# Patient Record
Sex: Female | Born: 1970 | Hispanic: Yes | Marital: Married | Smoking: Never smoker
Health system: Southern US, Community
[De-identification: ages and names within clinical notes are randomized; demographics above are authoritative.]

## PROBLEM LIST (undated history)

## (undated) DIAGNOSIS — I38 Endocarditis, valve unspecified: Secondary | ICD-10-CM

## (undated) DIAGNOSIS — E785 Hyperlipidemia, unspecified: Secondary | ICD-10-CM

## (undated) DIAGNOSIS — J810 Acute pulmonary edema: Secondary | ICD-10-CM

## (undated) DIAGNOSIS — J189 Pneumonia, unspecified organism: Secondary | ICD-10-CM

## (undated) DIAGNOSIS — R7611 Nonspecific reaction to tuberculin skin test without active tuberculosis: Secondary | ICD-10-CM

## (undated) DIAGNOSIS — D649 Anemia, unspecified: Secondary | ICD-10-CM

## (undated) DIAGNOSIS — R570 Cardiogenic shock: Secondary | ICD-10-CM

## (undated) DIAGNOSIS — I272 Pulmonary hypertension, unspecified: Secondary | ICD-10-CM

## (undated) DIAGNOSIS — E0591 Thyrotoxicosis, unspecified with thyrotoxic crisis or storm: Secondary | ICD-10-CM

## (undated) DIAGNOSIS — I77819 Aortic ectasia, unspecified site: Secondary | ICD-10-CM

## (undated) DIAGNOSIS — E119 Type 2 diabetes mellitus without complications: Secondary | ICD-10-CM

## (undated) DIAGNOSIS — R011 Cardiac murmur, unspecified: Secondary | ICD-10-CM

## (undated) DIAGNOSIS — I5020 Unspecified systolic (congestive) heart failure: Secondary | ICD-10-CM

## (undated) DIAGNOSIS — I2510 Atherosclerotic heart disease of native coronary artery without angina pectoris: Secondary | ICD-10-CM

## (undated) DIAGNOSIS — I10 Essential (primary) hypertension: Secondary | ICD-10-CM

## (undated) DIAGNOSIS — I219 Acute myocardial infarction, unspecified: Secondary | ICD-10-CM

## (undated) DIAGNOSIS — E0590 Thyrotoxicosis, unspecified without thyrotoxic crisis or storm: Secondary | ICD-10-CM

## (undated) DIAGNOSIS — E66813 Obesity, class 3: Secondary | ICD-10-CM

## (undated) DIAGNOSIS — G9340 Encephalopathy, unspecified: Secondary | ICD-10-CM

## (undated) HISTORY — DX: Nonspecific reaction to tuberculin skin test without active tuberculosis: R76.11

## (undated) HISTORY — DX: Obesity, class 3: E66.813

## (undated) HISTORY — DX: Acute pulmonary edema: J81.0

## (undated) HISTORY — DX: Cardiac murmur, unspecified: R01.1

## (undated) HISTORY — DX: Atherosclerotic heart disease of native coronary artery without angina pectoris: I25.10

## (undated) HISTORY — DX: Pulmonary hypertension, unspecified: I27.20

## (undated) HISTORY — DX: Pneumonia, unspecified organism: J18.9

## (undated) HISTORY — DX: Essential (primary) hypertension: I10

## (undated) HISTORY — DX: Cardiogenic shock: R57.0

## (undated) HISTORY — DX: Endocarditis, valve unspecified: I38

## (undated) HISTORY — PX: CARDIAC CATHETERIZATION: SHX172

## (undated) HISTORY — DX: Thyrotoxicosis, unspecified with thyrotoxic crisis or storm: E05.91

## (undated) HISTORY — DX: Thyrotoxicosis, unspecified without thyrotoxic crisis or storm: E05.90

## (undated) HISTORY — DX: Morbid (severe) obesity due to excess calories: E66.01

## (undated) HISTORY — DX: Aortic ectasia, unspecified site: I77.819

## (undated) HISTORY — DX: Encephalopathy, unspecified: G93.40

## (undated) HISTORY — DX: Anemia, unspecified: D64.9

## (undated) HISTORY — DX: Unspecified systolic (congestive) heart failure: I50.20

## (undated) HISTORY — DX: Hyperlipidemia, unspecified: E78.5

---

## 2012-02-17 ENCOUNTER — Other Ambulatory Visit (HOSPITAL_COMMUNITY)
Admission: RE | Admit: 2012-02-17 | Discharge: 2012-02-17 | Disposition: A | Discharge status: Home / Self Care | Payer: Self-pay | Source: Ambulatory Visit | Attending: Family Medicine | Admitting: Family Medicine

## 2012-02-17 DIAGNOSIS — Z0000 Encounter for general adult medical examination without abnormal findings: Secondary | ICD-10-CM | POA: Insufficient documentation

## 2012-06-04 ENCOUNTER — Other Ambulatory Visit: Payer: Self-pay | Admitting: Physician Assistant

## 2012-06-04 DIAGNOSIS — Z1231 Encounter for screening mammogram for malignant neoplasm of breast: Secondary | ICD-10-CM

## 2012-06-08 ENCOUNTER — Ambulatory Visit
Admission: RE | Admit: 2012-06-08 | Discharge: 2012-06-08 | Disposition: A | Discharge status: Home / Self Care | Payer: Managed Care, Other (non HMO) | Source: Ambulatory Visit | Attending: Physician Assistant | Admitting: Physician Assistant

## 2012-06-08 DIAGNOSIS — Z1231 Encounter for screening mammogram for malignant neoplasm of breast: Secondary | ICD-10-CM

## 2012-06-11 ENCOUNTER — Other Ambulatory Visit: Payer: Self-pay | Admitting: Physician Assistant

## 2012-06-11 DIAGNOSIS — R928 Other abnormal and inconclusive findings on diagnostic imaging of breast: Secondary | ICD-10-CM

## 2012-07-13 ENCOUNTER — Ambulatory Visit
Admission: RE | Admit: 2012-07-13 | Discharge: 2012-07-13 | Disposition: A | Discharge status: Home / Self Care | Payer: Managed Care, Other (non HMO) | Source: Ambulatory Visit | Attending: Physician Assistant | Admitting: Physician Assistant

## 2012-07-13 DIAGNOSIS — R928 Other abnormal and inconclusive findings on diagnostic imaging of breast: Secondary | ICD-10-CM

## 2013-01-03 ENCOUNTER — Telehealth: Payer: Self-pay | Admitting: Oncology

## 2013-01-03 NOTE — Telephone Encounter (Signed)
CALLED PT LVM TO RETURN CALL ON NP APPT. 01/10/13@12 :30(PER DR HA) REFERRING DR SUN DX-PERSISTENT LEUKOCYTOSIS MAILED NP PACKET

## 2013-01-03 NOTE — Telephone Encounter (Signed)
C/D 01/03/13 for appt. 01/10/13 °

## 2013-01-08 ENCOUNTER — Telehealth: Payer: Self-pay | Admitting: *Deleted

## 2013-01-08 NOTE — Telephone Encounter (Signed)
Pt called to confirm her appt for 3/27.  She asks if she can eat prior to appt.. Informed her ok to eat whatever she normally eats,  No need to fast.  She verbalized understanding.

## 2013-01-09 ENCOUNTER — Encounter: Payer: Self-pay | Admitting: Oncology

## 2013-01-09 DIAGNOSIS — D72829 Elevated white blood cell count, unspecified: Secondary | ICD-10-CM | POA: Insufficient documentation

## 2013-01-09 NOTE — Patient Instructions (Addendum)
1.  Issue:  Leukocytosis (elevated white blood cell) 2.  Potential causes:  Reactive process.  I did not see sign of leukemia on blood smear today.   3.  Recommendation:  Observation with regular blood work in about 4 and 8 months.  Return visit in about 1 year.   If white blood cell count significantly increases or also with anemia or low platelet count, we may consider diagnostic bone marrow biopsy in the future.  At this time, a bone marrow biopsy has very low clinical yield.

## 2013-01-10 ENCOUNTER — Encounter: Payer: Self-pay | Admitting: Oncology

## 2013-01-10 ENCOUNTER — Other Ambulatory Visit (HOSPITAL_BASED_OUTPATIENT_CLINIC_OR_DEPARTMENT_OTHER): Payer: Managed Care, Other (non HMO) | Admitting: Lab

## 2013-01-10 ENCOUNTER — Telehealth: Payer: Self-pay | Admitting: Oncology

## 2013-01-10 ENCOUNTER — Ambulatory Visit: Payer: Managed Care, Other (non HMO)

## 2013-01-10 ENCOUNTER — Ambulatory Visit (HOSPITAL_BASED_OUTPATIENT_CLINIC_OR_DEPARTMENT_OTHER): Payer: Managed Care, Other (non HMO) | Admitting: Oncology

## 2013-01-10 VITALS — BP 184/99 | HR 82 | Temp 98.6°F | Resp 20 | Ht 62.0 in | Wt 200.1 lb

## 2013-01-10 DIAGNOSIS — M259 Joint disorder, unspecified: Secondary | ICD-10-CM

## 2013-01-10 DIAGNOSIS — D72829 Elevated white blood cell count, unspecified: Secondary | ICD-10-CM

## 2013-01-10 DIAGNOSIS — R2231 Localized swelling, mass and lump, right upper limb: Secondary | ICD-10-CM

## 2013-01-10 LAB — LACTATE DEHYDROGENASE (CC13): LDH: 163 U/L (ref 125–245)

## 2013-01-10 LAB — COMPREHENSIVE METABOLIC PANEL (CC13)
ALT: 11 U/L (ref 0–55)
Albumin: 3.8 g/dL (ref 3.5–5.0)
Alkaline Phosphatase: 57 U/L (ref 40–150)
CO2: 23 mEq/L (ref 22–29)
Glucose: 107 mg/dl — ABNORMAL HIGH (ref 70–99)
Potassium: 3.4 mEq/L — ABNORMAL LOW (ref 3.5–5.1)
Sodium: 139 mEq/L (ref 136–145)
Total Bilirubin: 0.29 mg/dL (ref 0.20–1.20)
Total Protein: 7.4 g/dL (ref 6.4–8.3)

## 2013-01-10 LAB — CBC WITH DIFFERENTIAL/PLATELET
BASO%: 0.7 % (ref 0.0–2.0)
Basophils Absolute: 0.1 10*3/uL (ref 0.0–0.1)
EOS%: 0.6 % (ref 0.0–7.0)
HGB: 11.9 g/dL (ref 11.6–15.9)
MCH: 26.7 pg (ref 25.1–34.0)
RBC: 4.46 10*6/uL (ref 3.70–5.45)
RDW: 16 % — ABNORMAL HIGH (ref 11.2–14.5)
lymph#: 3.7 10*3/uL — ABNORMAL HIGH (ref 0.9–3.3)

## 2013-01-10 LAB — MORPHOLOGY
PLT EST: ADEQUATE
RBC Comments: NORMAL

## 2013-01-10 NOTE — Progress Notes (Signed)
[facility]  [phone] [phone]     INITIAL HEMATOLOGY CONSULTATION    Referral MD:  Ms. Ricci Barker, PA-C  Reason for Referral: lymphocyte-predominant leukocytosis.     HPI: Amber [name] is a 42 year-old [place] American woman with no significant PMH.  She recently had a CBC with her PCP on 12/07/2012 which showed WBC 13.1; Hgb 13.1; Plt 385; ANC 6.7; ALC 5.1.  She was kindly referred to the [facility] for evaluation.  Amber [name] presented to the Clinic today for the first time with her friend.  She reported feeling well.  She has this mass in the right upper shoulder for many years.  Over the last few weeks, her husband thinks that it is growing in size.  It has become tender and red.  She denied any recent bug bite, trauma, bleeding or discharge from the mass.  She cannot sleep supine due to the painful mass.    Patient denies fever, anorexia, weight loss, fatigue, headache, visual changes, confusion, drenching night sweats, palpable lymph node swelling, mucositis, odynophagia, dysphagia, nausea vomiting, jaundice, chest pain, palpitation, shortness of breath, dyspnea on exertion, productive cough, gum bleeding, epistaxis, hematemesis, hemoptysis, abdominal pain, abdominal swelling, early satiety, melena, hematochezia, hematuria, skin rash, spontaneous bleeding, joint swelling, joint pain, heat or cold intolerance, bowel bladder incontinence, back pain, focal motor weakness, paresthesia, depression, suicidal or homicidal ideation, feeling hopelessness.      Past Medical History  Diagnosis Date  . Leukocytosis, unspecified 01/09/2013  . HTN (hypertension)   :    Past Surgical History  Procedure Laterality Date  . Cesarean section      x2  :   CURRENT MEDS: Current Outpatient Prescriptions  Medication Sig Dispense Refill  . Cholecalciferol (VITAMIN D3) 2000 UNITS TABS Take 1 tablet by mouth daily.      . ferrous  sulfate 325 (65 FE) MG tablet Take by mouth daily with breakfast. Pt unsure of dose      . lisinopril-hydrochlorothiazide (PRINZIDE,ZESTORETIC) 20-12.5 MG per tablet Take 1 tablet by mouth daily.       No current facility-administered medications for this visit.      No Known Allergies:  Family History  Problem Relation Age of Onset  . Diabetes Father   :  History   Social History  . Marital Status: Unknown    Spouse Name: N/A    Number of Children: 2  . Years of Education: N/A   Occupational History  .      front desk    Social History Main Topics  . Smoking status: Never Smoker   . Smokeless tobacco: Never Used  . Alcohol Use: No  . Drug Use: No  . Sexually Active: Not on file   Other Topics Concern  . Not on file   Social History Narrative  . No narrative on file  :  REVIEW OF SYSTEM:  The rest of the 14-point review of sytem was negative.   Exam: ECOG 0.   General:  well-nourished woman, in no acute distress.  Eyes:  no scleral icterus.  ENT:  There were no oropharyngeal lesions.  Neck was without thyromegaly.  Lymphatics:  Negative cervical, supraclavicular or axillary adenopathy.  Respiratory: lungs were clear bilaterally without wheezing or crackles.  Cardiovascular:  Regular rate and rhythm, S1/S2, without murmur, rub or gallop.  There was no pedal edema.  GI:  abdomen was soft, flat, nontender, nondistended, without organomegaly.  Muscoloskeletal:  no  spinal tenderness of palpation of vertebral spine.  There was a 5cm SQ mass, hard, non fluctuant, erythematous, fixed, tender to palpation.  There was no open wound nor purulent discharge.  Skin exam was without echymosis, petichae.  Neuro exam was nonfocal.  Patient was able to get on and off exam table without assistance.  Gait was normal.  Patient was alerted and oriented.  Attention was good.   Language was appropriate.  Mood was normal without depression.  Speech was not pressured.  Thought content was not  tangential.    LABS:  Lab Results  Component Value Date   WBC 10.8* 01/10/2013   HGB 11.9 01/10/2013   HCT 34.7* 01/10/2013   PLT 313 01/10/2013   GLUCOSE 107* 01/10/2013   ALT 11 01/10/2013   AST 13 01/10/2013   NA 139 01/10/2013   K 3.4* 01/10/2013   CL 106 01/10/2013   CREATININE 0.8 01/10/2013   BUN 15.2 01/10/2013   CO2 23 01/10/2013    Blood smear review:   I personally reviewed the patient's peripheral blood smear today.  There was isocytosis.  There was no peripheral blast.  There was no schistocytosis, spherocytosis, target cell, rouleaux formation, tear drop cell.  There was no giant platelets or platelet clumps.      ASSESSMENT AND PLAN:   1. Leukocytosis; lymphocyte predominant:  Improved today.  - Potential causes:  Reactive process.  I did not see sign of leukemia on blood smear today.   - Recommendation:  Observation with regular blood work in about 4 and 8 months.  Return visit in about 1 year.   If white blood cell count significantly increases or also with anemia or low platelet count, we may consider diagnostic bone marrow biopsy in the future.  At this time, a bone marrow biopsy has very low clinical yield.   2.  Right upper shoulder mass: - Differentials:  Infected cyst; lipoma; need to rule out sarcoma due to recent growth in size and symptomatic.   - Work up:  I referred her to [facility] for evaluation within 1 week.     The length of time of the face-to-face encounter was 30 minutes. More than 50% of time was spent counseling and coordination of care.     Thank you for this referral.      '

## 2013-01-17 ENCOUNTER — Encounter (HOSPITAL_COMMUNITY): Payer: Self-pay | Admitting: Pharmacy Technician

## 2013-01-17 ENCOUNTER — Ambulatory Visit (INDEPENDENT_AMBULATORY_CARE_PROVIDER_SITE_OTHER): Payer: Managed Care, Other (non HMO) | Admitting: General Surgery

## 2013-01-17 ENCOUNTER — Ambulatory Visit (HOSPITAL_COMMUNITY)
Admission: RE | Admit: 2013-01-17 | Discharge: 2013-01-17 | Disposition: A | Discharge status: Home / Self Care | Payer: Managed Care, Other (non HMO) | Source: Ambulatory Visit | Attending: General Surgery | Admitting: General Surgery

## 2013-01-17 ENCOUNTER — Encounter (HOSPITAL_COMMUNITY)
Admission: RE | Admit: 2013-01-17 | Discharge: 2013-01-17 | Disposition: A | Discharge status: Home / Self Care | Payer: Managed Care, Other (non HMO) | Source: Ambulatory Visit | Attending: General Surgery | Admitting: General Surgery

## 2013-01-17 ENCOUNTER — Encounter (INDEPENDENT_AMBULATORY_CARE_PROVIDER_SITE_OTHER): Payer: Self-pay | Admitting: General Surgery

## 2013-01-17 ENCOUNTER — Encounter (HOSPITAL_COMMUNITY): Payer: Self-pay

## 2013-01-17 VITALS — BP 126/86 | HR 88 | Temp 97.3°F | Resp 16 | Ht 63.0 in | Wt 195.2 lb

## 2013-01-17 DIAGNOSIS — L089 Local infection of the skin and subcutaneous tissue, unspecified: Secondary | ICD-10-CM

## 2013-01-17 DIAGNOSIS — L723 Sebaceous cyst: Secondary | ICD-10-CM

## 2013-01-17 LAB — CBC
Hemoglobin: 12.7 g/dL (ref 12.0–15.0)
MCH: 27.4 pg (ref 26.0–34.0)
RBC: 4.64 MIL/uL (ref 3.87–5.11)

## 2013-01-17 LAB — BASIC METABOLIC PANEL
CO2: 29 mEq/L (ref 19–32)
Calcium: 9.4 mg/dL (ref 8.4–10.5)
Glucose, Bld: 110 mg/dL — ABNORMAL HIGH (ref 70–99)
Potassium: 3.6 mEq/L (ref 3.5–5.1)
Sodium: 137 mEq/L (ref 135–145)

## 2013-01-17 MED ORDER — OXYCODONE-ACETAMINOPHEN 10-325 MG PO TABS: 1.0000 | ORAL_TABLET | Freq: Four times a day (QID) | ORAL | Status: DC | PRN

## 2013-01-17 NOTE — Progress Notes (Signed)
Husband with patient today at preop appointment.  Patient understands most English.  Patient signed consent for Release of Interpreter for husband to be interpreter.  Placed on front of chart.

## 2013-01-17 NOTE — Progress Notes (Signed)
Patient ID: Amber [name], female   DOB: 06/24/1971, 42 y.o.   MRN: [id number]  Chief Complaint  Patient presents with  . New Evaluation    eval Rt shoulder mass    HPI Amber [name] is a 42 y.o. female.  The patient is a 42-year-old female who is referred by Dr. Ha for an evaluation of the right shoulder mass. Patient states that the mass been in for approximately 3 years and is becoming more erythematous and grown in size over the last several months.  She's had no draining from the area, but has become more painful in the last month.  HPI  Past Medical History  Diagnosis Date  . Leukocytosis, unspecified 01/09/2013  . HTN (hypertension)     Past Surgical History  Procedure Laterality Date  . Cesarean section      x2    Family History  Problem Relation Age of Onset  . Diabetes Father   . Diabetes Mother   . Heart disease Mother   . Diabetes Sister   . Hypertension Sister     Social History History  Substance Use Topics  . Smoking status: Never Smoker   . Smokeless tobacco: Never Used  . Alcohol Use: No    No Known Allergies  Current Outpatient Prescriptions  Medication Sig Dispense Refill  . Cholecalciferol (VITAMIN D3) 2000 UNITS TABS Take 1 tablet by mouth daily.      . ferrous sulfate 325 (65 FE) MG tablet Take by mouth daily with breakfast. Pt unsure of dose      . lisinopril-hydrochlorothiazide (PRINZIDE,ZESTORETIC) 20-12.5 MG per tablet Take 1 tablet by mouth daily.      . cephALEXin (KEFLEX) 500 MG capsule       . traMADol (ULTRAM) 50 MG tablet        No current facility-administered medications for this visit.    Review of Systems Review of Systems  Constitutional: Negative.   HENT: Negative.   Eyes: Negative.   Respiratory: Negative.   Cardiovascular: Negative.   Gastrointestinal: Negative.   Endocrine: Negative.   Neurological: Negative.   All other systems reviewed and are negative.    Blood pressure 126/86, pulse 88, temperature 97.3 F  (36.3 C), temperature source Temporal, resp. rate 16, height 5' 3" (1.6 m), weight 195 lb 3.2 oz (88.542 kg).  Physical Exam Physical Exam  Constitutional: She is oriented to person, place, and time. She appears well-developed and well-nourished.  HENT:  Head: Normocephalic and atraumatic.  Eyes: Conjunctivae and EOM are normal. Pupils are equal, round, and reactive to light.  Neck: Normal range of motion. Neck supple.  Cardiovascular: Normal rate, regular rhythm and normal heart sounds.   Pulmonary/Chest: Effort normal and breath sounds normal.  Abdominal: Soft. Bowel sounds are normal.  Musculoskeletal: Normal range of motion.  Neurological: She is alert and oriented to person, place, and time.  Skin:       Data Reviewed none  Assessment    A 42-year-old female with a right scapular cyst.     Plan    1. Will proceed the operating room for excision of right scapular cyst.  2. We discussed risks and benefits of excision of her cyst. These do include infection, bleeding, recurrence, and damage to surrounding structures. The patient was understanding and wished to proceed with the procedure.        Desiray Orchard Jr., Prachi Oftedahl 01/17/2013, 9:38 AM    

## 2013-01-17 NOTE — Patient Instructions (Signed)
Amber [name]  01/17/2013   Your procedure is scheduled on:  01/18/13   Report to [facility] at   0730 AM.  Call this number if you have problems the morning of surgery: [phone]   Remember:   Do not eat food or drink liquids after midnight.   Take these medicines the morning of surgery with A SIP OF WATER:    Do not wear jewelry, make-up or nail polish.  Do not wear lotions, powders, or perfumes.   Do not shave 48 hours prior to surgery.   Do not bring valuables to the hospital.  Contacts, dentures or bridgework may not be worn into surgery.     Patients discharged the day of surgery will not be allowed to drive  home.  Name and phone number of your driver:    SEE CHG INSTRUCTION SHEET    Please read over the following fact sheets that you were given: MRSA Information, coughing and deep breathing exercises, leg exercises               Failure to comply with these instructions may result in cancellation of your surgery.                Patient Signature ____________________________              Nurse Signature _____________________________

## 2013-01-17 NOTE — Progress Notes (Signed)
Dr. Derrell Lolling,         Amber [name] is coming to [facility] this afternoon at 2:00 pm for her preop visit.  Please enter her preop orders in Epic.                         Thanks

## 2013-01-18 ENCOUNTER — Ambulatory Visit (HOSPITAL_COMMUNITY)
Admission: RE | Admit: 2013-01-18 | Discharge: 2013-01-18 | Disposition: A | Discharge status: Home / Self Care | Payer: Managed Care, Other (non HMO) | Source: Ambulatory Visit | Attending: General Surgery | Admitting: General Surgery

## 2013-01-18 ENCOUNTER — Encounter (HOSPITAL_COMMUNITY)
Admission: RE | Disposition: A | Discharge status: Home / Self Care | Payer: Self-pay | Source: Ambulatory Visit | Attending: General Surgery

## 2013-01-18 ENCOUNTER — Encounter (INDEPENDENT_AMBULATORY_CARE_PROVIDER_SITE_OTHER): Payer: Self-pay | Admitting: General Surgery

## 2013-01-18 ENCOUNTER — Ambulatory Visit (HOSPITAL_COMMUNITY): Payer: Managed Care, Other (non HMO) | Admitting: Anesthesiology

## 2013-01-18 ENCOUNTER — Encounter (HOSPITAL_COMMUNITY): Payer: Self-pay | Admitting: *Deleted

## 2013-01-18 ENCOUNTER — Encounter (HOSPITAL_COMMUNITY): Payer: Self-pay | Admitting: Anesthesiology

## 2013-01-18 DIAGNOSIS — L723 Sebaceous cyst: Secondary | ICD-10-CM

## 2013-01-18 HISTORY — PX: MASS EXCISION: SHX2000

## 2013-01-18 SURGERY — EXCISION MASS
Anesthesia: General | Site: Shoulder | Laterality: Right | Wound class: Dirty or Infected

## 2013-01-18 MED ORDER — SULFAMETHOXAZOLE-TRIMETHOPRIM 800-160 MG PO TABS: 1.0000 | ORAL_TABLET | Freq: Two times a day (BID) | ORAL | Status: AC

## 2013-01-18 MED ORDER — ONDANSETRON HCL 4 MG/2ML IJ SOLN
INTRAMUSCULAR | Status: DC | PRN
  Administered 2013-01-18 (×2): 2 mg via INTRAVENOUS

## 2013-01-18 MED ORDER — LACTATED RINGERS IV SOLN: INTRAVENOUS | Status: DC

## 2013-01-18 MED ORDER — ACETAMINOPHEN 10 MG/ML IV SOLN
INTRAVENOUS | Status: AC
  Filled 2013-01-18: qty 100

## 2013-01-18 MED ORDER — LACTATED RINGERS IV SOLN
INTRAVENOUS | Status: DC
  Administered 2013-01-18: 1000 mL via INTRAVENOUS

## 2013-01-18 MED ORDER — METOCLOPRAMIDE HCL 5 MG/ML IJ SOLN
INTRAMUSCULAR | Status: DC | PRN
  Administered 2013-01-18: 5 mg via INTRAVENOUS

## 2013-01-18 MED ORDER — FENTANYL CITRATE 0.05 MG/ML IJ SOLN: 25.0000 ug | INTRAMUSCULAR | Status: DC | PRN

## 2013-01-18 MED ORDER — CEFAZOLIN SODIUM-DEXTROSE 2-3 GM-% IV SOLR
2.0000 g | INTRAVENOUS | Status: AC
  Administered 2013-01-18: 2 g via INTRAVENOUS

## 2013-01-18 MED ORDER — 0.9 % SODIUM CHLORIDE (POUR BTL) OPTIME
TOPICAL | Status: DC | PRN
  Administered 2013-01-18: 1000 mL

## 2013-01-18 MED ORDER — BUPIVACAINE-EPINEPHRINE 0.25% -1:200000 IJ SOLN
INTRAMUSCULAR | Status: AC
  Filled 2013-01-18: qty 1

## 2013-01-18 MED ORDER — LIDOCAINE HCL (CARDIAC) 20 MG/ML IV SOLN
INTRAVENOUS | Status: DC | PRN
  Administered 2013-01-18: 75 mg via INTRAVENOUS

## 2013-01-18 MED ORDER — CEFAZOLIN SODIUM-DEXTROSE 2-3 GM-% IV SOLR
INTRAVENOUS | Status: AC
  Filled 2013-01-18: qty 50

## 2013-01-18 MED ORDER — MIDAZOLAM HCL 5 MG/5ML IJ SOLN
INTRAMUSCULAR | Status: DC | PRN
  Administered 2013-01-18 (×2): 1 mg via INTRAVENOUS

## 2013-01-18 MED ORDER — DEXAMETHASONE SODIUM PHOSPHATE 10 MG/ML IJ SOLN
INTRAMUSCULAR | Status: DC | PRN
  Administered 2013-01-18: 10 mg via INTRAVENOUS

## 2013-01-18 MED ORDER — EPHEDRINE SULFATE 50 MG/ML IJ SOLN
INTRAMUSCULAR | Status: DC | PRN
  Administered 2013-01-18 (×2): 5 mg via INTRAVENOUS

## 2013-01-18 MED ORDER — LACTATED RINGERS IV SOLN
INTRAVENOUS | Status: DC | PRN
  Administered 2013-01-18 (×2): via INTRAVENOUS

## 2013-01-18 MED ORDER — FENTANYL CITRATE 0.05 MG/ML IJ SOLN
INTRAMUSCULAR | Status: DC | PRN
  Administered 2013-01-18 (×2): 50 ug via INTRAVENOUS

## 2013-01-18 MED ORDER — OXYCODONE-ACETAMINOPHEN 10-325 MG PO TABS: 1.0000 | ORAL_TABLET | ORAL | Status: DC | PRN

## 2013-01-18 MED ORDER — PROMETHAZINE HCL 25 MG/ML IJ SOLN: 6.2500 mg | INTRAMUSCULAR | Status: DC | PRN

## 2013-01-18 MED ORDER — PROPOFOL 10 MG/ML IV EMUL
INTRAVENOUS | Status: DC | PRN
  Administered 2013-01-18: 180 mg via INTRAVENOUS

## 2013-01-18 MED ORDER — ACETAMINOPHEN 10 MG/ML IV SOLN
INTRAVENOUS | Status: DC | PRN
  Administered 2013-01-18: 1000 mg via INTRAVENOUS

## 2013-01-18 SURGICAL SUPPLY — 39 items
BENZOIN TINCTURE PRP APPL 2/3 (GAUZE/BANDAGES/DRESSINGS) IMPLANT
BLADE HEX COATED 2.75 (ELECTRODE) ×2 IMPLANT
BLADE SURG SZ10 CARB STEEL (BLADE) ×4 IMPLANT
CANISTER SUCTION 2500CC (MISCELLANEOUS) ×2 IMPLANT
CLOTH BEACON ORANGE TIMEOUT ST (SAFETY) ×2 IMPLANT
DECANTER SPIKE VIAL GLASS SM (MISCELLANEOUS) IMPLANT
DRAPE LAPAROTOMY T 102X78X121 (DRAPES) IMPLANT
DRAPE LAPAROTOMY TRNSV 102X78 (DRAPE) IMPLANT
DRAPE LG THREE QUARTER DISP (DRAPES) IMPLANT
DRSG PAD ABDOMINAL 8X10 ST (GAUZE/BANDAGES/DRESSINGS) ×2 IMPLANT
ELECT REM PT RETURN 9FT ADLT (ELECTROSURGICAL) ×2
ELECTRODE REM PT RTRN 9FT ADLT (ELECTROSURGICAL) ×1 IMPLANT
EVACUATOR SILICONE 100CC (DRAIN) IMPLANT
GAUZE PACKING IODOFORM 1/2 (PACKING) ×2 IMPLANT
GLOVE BIOGEL PI IND STRL 7.0 (GLOVE) ×1 IMPLANT
GLOVE BIOGEL PI INDICATOR 7.0 (GLOVE) ×1
GLOVE ECLIPSE 8.0 STRL XLNG CF (GLOVE) ×2 IMPLANT
GLOVE INDICATOR 8.0 STRL GRN (GLOVE) ×4 IMPLANT
GOWN STRL NON-REIN LRG LVL3 (GOWN DISPOSABLE) ×2 IMPLANT
GOWN STRL REIN XL XLG (GOWN DISPOSABLE) ×4 IMPLANT
KIT BASIN OR (CUSTOM PROCEDURE TRAY) ×2 IMPLANT
MARKER SKIN DUAL TIP RULER LAB (MISCELLANEOUS) IMPLANT
NEEDLE HYPO 25X1 1.5 SAFETY (NEEDLE) ×2 IMPLANT
NS IRRIG 1000ML POUR BTL (IV SOLUTION) ×2 IMPLANT
PACK BASIC VI WITH GOWN DISP (CUSTOM PROCEDURE TRAY) ×2 IMPLANT
PENCIL BUTTON HOLSTER BLD 10FT (ELECTRODE) ×2 IMPLANT
SOL PREP POV-IOD 16OZ 10% (MISCELLANEOUS) ×2 IMPLANT
SPONGE GAUZE 4X4 12PLY (GAUZE/BANDAGES/DRESSINGS) ×2 IMPLANT
SPONGE LAP 18X18 X RAY DECT (DISPOSABLE) ×2 IMPLANT
SPONGE LAP 4X18 X RAY DECT (DISPOSABLE) IMPLANT
STAPLER VISISTAT 35W (STAPLE) IMPLANT
SUT MNCRL AB 4-0 PS2 18 (SUTURE) ×2 IMPLANT
SUT VIC AB 3-0 SH 27 (SUTURE) ×1
SUT VIC AB 3-0 SH 27XBRD (SUTURE) ×1 IMPLANT
SUT VICRYL 0 UR6 27IN ABS (SUTURE) IMPLANT
SYR CONTROL 10ML LL (SYRINGE) ×2 IMPLANT
TAPE CLOTH SURG 4X10 WHT LF (GAUZE/BANDAGES/DRESSINGS) ×2 IMPLANT
TOWEL OR 17X26 10 PK STRL BLUE (TOWEL DISPOSABLE) ×2 IMPLANT
YANKAUER SUCT BULB TIP 10FT TU (MISCELLANEOUS) IMPLANT

## 2013-01-18 NOTE — Op Note (Signed)
Pre Operative Diagnosis:  R scapular infected cyst  Post Operative Diagnosis: same  Procedure: I&D and excision of cyst  Surgeon: Dr. Axel Filler  Assistant: none  Anesthesia: GETA  EBL: 5 cc  Complications: none  Counts: reported as correct x 2  Findings:  The patient had an infected sebaceous cyst that was purulent, and ruptured. The second cyst contents were then sent for pathology.  Indications for procedure:  The patient is a 42 year old female with approximately two-month history of right scapular cyst. The patient stated that over the last several weeks the area become more painful and erythematous. The patient presented to clinic and wished to have this electively  Drained/excised.  Details of the procedure:The patient was taken back to the operating room. The patient was placed in supine position with bilateral SCDs in place. After appropriate anitbiotics were confirmed, a time-out was confirmed and all facts were verified.  An elliptical incision was made just over the area of greatest dimension. Bovie cautery was used to maintain hemostasis dissection was carried down to the cyst is large amount of purulence that was initially encountered. This was evacuated the cyst contents were also evacuated. The cyst sac was attempted to be excised circumferentially down to good healthy fat. This was sent off for pathology. Then used Bovie cautery to obtain hemostasis of the wound. The area was irrigated out with sterile saline. The area was then packed with half-inch gauze. The wounds and dressed with 4 x 4's, ABDs, and tape.  The patient was taken to the recovery room in stable condition.

## 2013-01-18 NOTE — Transfer of Care (Signed)
Immediate Anesthesia Transfer of Care Note  Patient: Amber [name]  Procedure(s) Performed: Procedure(s): EXCISION right scapular cyst (Right)  Patient Location: PACU  Anesthesia Type:General  Level of Consciousness: awake, oriented and patient cooperative  Airway & Oxygen Therapy: Patient Spontanous Breathing, Patient connected to face mask oxygen and Patient connected to face mask  Post-op Assessment: Report given to PACU RN and Post -op Vital signs reviewed and stable  Post vital signs: stable  Complications: No apparent anesthesia complications

## 2013-01-18 NOTE — Interval H&P Note (Signed)
History and Physical Interval Note:  01/18/2013 7:21 AM  Amber [name]  has presented today for surgery, with the diagnosis of RIGHT SCAPULAR CYST  The various methods of treatment have been discussed with the patient and family. After consideration of risks, benefits and other options for treatment, the patient has consented to  Procedure(s): EXCISION right scapular cyst (Right) as a surgical intervention .  The patient's history has been reviewed, patient examined, no change in status, stable for surgery.  I have reviewed the patient's chart and labs.  Questions were answered to the patient's satisfaction.     Marigene Ehlers., Jed Limerick

## 2013-01-18 NOTE — Anesthesia Postprocedure Evaluation (Signed)
Anesthesia Post Note  Patient: Amber [name]  Procedure(s) Performed: Procedure(s) (LRB): EXCISION right scapular cyst (Right)  Anesthesia type: General  Patient location: PACU  Post pain: Pain level controlled  Post assessment: Post-op Vital signs reviewed  Last Vitals:  Filed Vitals:   01/18/13 1202  BP: 159/95  Pulse: 64  Temp: 36.5 C  Resp: 14    Post vital signs: Reviewed  Level of consciousness: sedated  Complications: No apparent anesthesia complications

## 2013-01-18 NOTE — H&P (View-Only) (Signed)
Patient ID: Amber [name], female   DOB: 1971-01-29, 42 y.o.   MRN: [id number]  Chief Complaint  Patient presents with  . New Evaluation    eval Rt shoulder mass    HPI Amber [name] is a 42 y.o. female.  The patient is a 42 year old female who is referred by Dr. Gaylyn Rong for an evaluation of the right shoulder mass. Patient states that the mass been in for approximately 3 years and is becoming more erythematous and grown in size over the last several months.  She's had no draining from the area, but has become more painful in the last month.  HPI  Past Medical History  Diagnosis Date  . Leukocytosis, unspecified 01/09/2013  . HTN (hypertension)     Past Surgical History  Procedure Laterality Date  . Cesarean section      x2    Family History  Problem Relation Age of Onset  . Diabetes Father   . Diabetes Mother   . Heart disease Mother   . Diabetes Sister   . Hypertension Sister     Social History History  Substance Use Topics  . Smoking status: Never Smoker   . Smokeless tobacco: Never Used  . Alcohol Use: No    No Known Allergies  Current Outpatient Prescriptions  Medication Sig Dispense Refill  . Cholecalciferol (VITAMIN D3) 2000 UNITS TABS Take 1 tablet by mouth daily.      . ferrous sulfate 325 (65 FE) MG tablet Take by mouth daily with breakfast. Pt unsure of dose      . lisinopril-hydrochlorothiazide (PRINZIDE,ZESTORETIC) 20-12.5 MG per tablet Take 1 tablet by mouth daily.      . cephALEXin (KEFLEX) 500 MG capsule       . traMADol (ULTRAM) 50 MG tablet        No current facility-administered medications for this visit.    Review of Systems Review of Systems  Constitutional: Negative.   HENT: Negative.   Eyes: Negative.   Respiratory: Negative.   Cardiovascular: Negative.   Gastrointestinal: Negative.   Endocrine: Negative.   Neurological: Negative.   All other systems reviewed and are negative.    Blood pressure 126/86, pulse 88, temperature 97.3 F  (36.3 C), temperature source Temporal, resp. rate 16, height 5\' 3"  (1.6 m), weight 195 lb 3.2 oz (88.542 kg).  Physical Exam Physical Exam  Constitutional: She is oriented to person, place, and time. She appears well-developed and well-nourished.  HENT:  Head: Normocephalic and atraumatic.  Eyes: Conjunctivae and EOM are normal. Pupils are equal, round, and reactive to light.  Neck: Normal range of motion. Neck supple.  Cardiovascular: Normal rate, regular rhythm and normal heart sounds.   Pulmonary/Chest: Effort normal and breath sounds normal.  Abdominal: Soft. Bowel sounds are normal.  Musculoskeletal: Normal range of motion.  Neurological: She is alert and oriented to person, place, and time.  Skin:       Data Reviewed none  Assessment    A 42 year old female with a right scapular cyst.     Plan    1. Will proceed the operating room for excision of right scapular cyst.  2. We discussed risks and benefits of excision of her cyst. These do include infection, bleeding, recurrence, and damage to surrounding structures. The patient was understanding and wished to proceed with the procedure.        Amber Ehlers., Amber [name] 01/17/2013, 9:38 AM

## 2013-01-18 NOTE — Anesthesia Preprocedure Evaluation (Addendum)
Anesthesia Evaluation  Patient identified by MRN, date of birth, ID band Patient awake    Reviewed: Allergy & Precautions, H&P , NPO status , Patient's Chart, lab work & pertinent test results  Airway Mallampati: II TM Distance: >3 FB Neck ROM: Full    Dental  (+) Teeth Intact and Dental Advisory Given   Pulmonary neg pulmonary ROS,  breath sounds clear to auscultation  Pulmonary exam normal       Cardiovascular hypertension, Pt. on medications negative cardio ROS  Rhythm:Regular Rate:Normal     Neuro/Psych negative neurological ROS  negative psych ROS   GI/Hepatic negative GI ROS, Neg liver ROS,   Endo/Other  negative endocrine ROS  Renal/GU negative Renal ROS  negative genitourinary   Musculoskeletal negative musculoskeletal ROS (+)   Abdominal   Peds  Hematology negative hematology ROS (+)   Anesthesia Other Findings   Reproductive/Obstetrics                          Anesthesia Physical Anesthesia Plan  ASA: II  Anesthesia Plan: General   Post-op Pain Management:    Induction: Intravenous  Airway Management Planned: LMA  Additional Equipment:   Intra-op Plan:   Post-operative Plan: Extubation in OR  Informed Consent: I have reviewed the patients History and Physical, chart, labs and discussed the procedure including the risks, benefits and alternatives for the proposed anesthesia with the patient or authorized representative who has indicated his/her understanding and acceptance.     Plan Discussed with: CRNA  Anesthesia Plan Comments:        Anesthesia Quick Evaluation

## 2013-01-21 ENCOUNTER — Encounter (HOSPITAL_COMMUNITY): Payer: Self-pay | Admitting: General Surgery

## 2013-01-23 ENCOUNTER — Ambulatory Visit (INDEPENDENT_AMBULATORY_CARE_PROVIDER_SITE_OTHER): Payer: Managed Care, Other (non HMO) | Admitting: General Surgery

## 2013-01-29 ENCOUNTER — Ambulatory Visit (INDEPENDENT_AMBULATORY_CARE_PROVIDER_SITE_OTHER): Payer: Managed Care, Other (non HMO) | Admitting: General Surgery

## 2013-01-29 ENCOUNTER — Encounter (INDEPENDENT_AMBULATORY_CARE_PROVIDER_SITE_OTHER): Payer: Self-pay | Admitting: General Surgery

## 2013-01-29 VITALS — BP 132/68 | HR 74 | Temp 98.9°F | Resp 18 | Ht 63.0 in | Wt 196.2 lb

## 2013-01-29 DIAGNOSIS — Z9889 Other specified postprocedural states: Secondary | ICD-10-CM

## 2013-01-29 DIAGNOSIS — Z872 Personal history of diseases of the skin and subcutaneous tissue: Secondary | ICD-10-CM

## 2013-01-29 NOTE — Progress Notes (Signed)
Patient ID: Amber [name], female   DOB: April 06, 1971, 42 y.o.   MRN: [id number] The patient is a 42 year old female status post I&D and excision of a cyst.   The patient has been doing well and continued with dressing changes. No drainage, fevers, or erythema to the wound.  Pathology: Reveals an epidermal inclusion cyst this was discussed with the patient.  On exam: The wound is clean dry and intact The areas beefy red, there is no drainage  Assessment and plan:  1.we will have the patient follow back up in 2 weeks.

## 2013-01-31 ENCOUNTER — Encounter (INDEPENDENT_AMBULATORY_CARE_PROVIDER_SITE_OTHER): Payer: Managed Care, Other (non HMO) | Admitting: General Surgery

## 2013-02-12 ENCOUNTER — Telehealth (INDEPENDENT_AMBULATORY_CARE_PROVIDER_SITE_OTHER): Payer: Self-pay | Admitting: General Surgery

## 2013-02-12 NOTE — Telephone Encounter (Signed)
LMOM @ 4:21 02/12/13 asking patient to return my call.Marland KitchenMarland Kitchen

## 2013-02-13 ENCOUNTER — Ambulatory Visit (INDEPENDENT_AMBULATORY_CARE_PROVIDER_SITE_OTHER): Payer: Managed Care, Other (non HMO) | Admitting: General Surgery

## 2013-02-13 ENCOUNTER — Encounter (INDEPENDENT_AMBULATORY_CARE_PROVIDER_SITE_OTHER): Payer: Self-pay | Admitting: General Surgery

## 2013-02-13 VITALS — BP 150/92 | HR 74 | Temp 97.4°F | Ht 63.0 in | Wt 197.8 lb

## 2013-02-13 DIAGNOSIS — Z9889 Other specified postprocedural states: Secondary | ICD-10-CM

## 2013-02-13 DIAGNOSIS — Z872 Personal history of diseases of the skin and subcutaneous tissue: Secondary | ICD-10-CM

## 2013-02-13 NOTE — Progress Notes (Signed)
Patient ID: Amber [name], female   DOB: February 02, 1971, 42 y.o.   MRN: [id number] The patient is a 42 year old female status post I&D and excision of a cyst.  The patient has been doing well and continued with dressing changes. No drainage, fevers, or erythema to the wound.    On exam: The wound is shallow, beefy-red  Assessment and plan:  42 year old female status post I&D and excision of a cyst 1.the patient to followup when necessary and continue dressing changes as needed

## 2013-05-10 ENCOUNTER — Other Ambulatory Visit (HOSPITAL_BASED_OUTPATIENT_CLINIC_OR_DEPARTMENT_OTHER): Payer: Managed Care, Other (non HMO) | Admitting: Lab

## 2013-05-10 ENCOUNTER — Telehealth: Payer: Self-pay | Admitting: *Deleted

## 2013-05-10 DIAGNOSIS — D72829 Elevated white blood cell count, unspecified: Secondary | ICD-10-CM

## 2013-05-10 LAB — CBC WITH DIFFERENTIAL/PLATELET
Basophils Absolute: 0.1 10*3/uL (ref 0.0–0.1)
EOS%: 1.4 % (ref 0.0–7.0)
HCT: 35 % (ref 34.8–46.6)
HGB: 12.4 g/dL (ref 11.6–15.9)
MCH: 27.4 pg (ref 25.1–34.0)
MONO#: 1.2 10*3/uL — ABNORMAL HIGH (ref 0.1–0.9)
NEUT%: 51.7 % (ref 38.4–76.8)
lymph#: 3.1 10*3/uL (ref 0.9–3.3)

## 2013-05-10 NOTE — Telephone Encounter (Signed)
Left VM for pt to return call regarding lab work.

## 2013-05-10 NOTE — Telephone Encounter (Signed)
Message copied by Wende Mott on Fri May 10, 2013  1:42 PM ------      Message from: Myrtis Ser      Created: Fri May 10, 2013 10:23 AM       Call pt. WBC is normal today. Continue observation. ------

## 2013-09-02 ENCOUNTER — Other Ambulatory Visit: Payer: Self-pay

## 2013-09-02 DIAGNOSIS — Z1231 Encounter for screening mammogram for malignant neoplasm of breast: Secondary | ICD-10-CM

## 2013-09-13 ENCOUNTER — Other Ambulatory Visit: Payer: Managed Care, Other (non HMO) | Admitting: Lab

## 2013-09-13 ENCOUNTER — Other Ambulatory Visit: Payer: Self-pay | Admitting: *Deleted

## 2013-09-13 DIAGNOSIS — D72829 Elevated white blood cell count, unspecified: Secondary | ICD-10-CM

## 2013-10-04 ENCOUNTER — Ambulatory Visit
Admission: RE | Admit: 2013-10-04 | Discharge: 2013-10-04 | Disposition: A | Discharge status: Home / Self Care | Payer: Private Health Insurance - Indemnity | Source: Ambulatory Visit

## 2013-10-04 DIAGNOSIS — Z1231 Encounter for screening mammogram for malignant neoplasm of breast: Secondary | ICD-10-CM

## 2013-12-30 ENCOUNTER — Telehealth: Payer: Self-pay | Admitting: Hematology and Oncology

## 2013-12-30 NOTE — Telephone Encounter (Signed)
pt called to r/s 3/30 appt to 4/29. pt has new d/t.

## 2014-01-10 ENCOUNTER — Other Ambulatory Visit: Payer: Managed Care, Other (non HMO)

## 2014-01-10 ENCOUNTER — Ambulatory Visit: Payer: Managed Care, Other (non HMO) | Admitting: Oncology

## 2014-01-13 ENCOUNTER — Other Ambulatory Visit: Payer: Private Health Insurance - Indemnity

## 2014-01-13 ENCOUNTER — Ambulatory Visit: Payer: Self-pay | Admitting: Hematology and Oncology

## 2014-02-12 ENCOUNTER — Other Ambulatory Visit (HOSPITAL_BASED_OUTPATIENT_CLINIC_OR_DEPARTMENT_OTHER): Payer: Private Health Insurance - Indemnity

## 2014-02-12 ENCOUNTER — Ambulatory Visit (HOSPITAL_BASED_OUTPATIENT_CLINIC_OR_DEPARTMENT_OTHER): Payer: Private Health Insurance - Indemnity | Admitting: Hematology and Oncology

## 2014-02-12 VITALS — BP 145/95 | HR 70 | Temp 99.1°F | Resp 18 | Ht 63.0 in | Wt 209.9 lb

## 2014-02-12 DIAGNOSIS — D72829 Elevated white blood cell count, unspecified: Secondary | ICD-10-CM

## 2014-02-12 DIAGNOSIS — R718 Other abnormality of red blood cells: Secondary | ICD-10-CM

## 2014-02-12 LAB — CBC WITH DIFFERENTIAL/PLATELET
BASO%: 0.8 % (ref 0.0–2.0)
Basophils Absolute: 0.1 10*3/uL (ref 0.0–0.1)
EOS ABS: 0.2 10*3/uL (ref 0.0–0.5)
EOS%: 1.4 % (ref 0.0–7.0)
HCT: 38.2 % (ref 34.8–46.6)
HGB: 13.5 g/dL (ref 11.6–15.9)
LYMPH#: 4.4 10*3/uL — AB (ref 0.9–3.3)
LYMPH%: 38.2 % (ref 14.0–49.7)
MCH: 27.2 pg (ref 25.1–34.0)
MCHC: 35.3 g/dL (ref 31.5–36.0)
MCV: 77 fL — ABNORMAL LOW (ref 79.5–101.0)
MONO#: 1 10*3/uL — AB (ref 0.1–0.9)
MONO%: 8.5 % (ref 0.0–14.0)
NEUT%: 51.1 % (ref 38.4–76.8)
NEUTROS ABS: 5.9 10*3/uL (ref 1.5–6.5)
Platelets: 351 10*3/uL (ref 145–400)
RBC: 4.96 10*6/uL (ref 3.70–5.45)
RDW: 15 % — AB (ref 11.2–14.5)
WBC: 11.4 10*3/uL — AB (ref 3.9–10.3)

## 2014-02-12 LAB — COMPREHENSIVE METABOLIC PANEL (CC13)
ALBUMIN: 3.9 g/dL (ref 3.5–5.0)
ALT: 11 U/L (ref 0–55)
ANION GAP: 11 meq/L (ref 3–11)
AST: 17 U/L (ref 5–34)
Alkaline Phosphatase: 47 U/L (ref 40–150)
BILIRUBIN TOTAL: 0.38 mg/dL (ref 0.20–1.20)
BUN: 13.1 mg/dL (ref 7.0–26.0)
CHLORIDE: 108 meq/L (ref 98–109)
CO2: 23 meq/L (ref 22–29)
Calcium: 9.7 mg/dL (ref 8.4–10.4)
Creatinine: 0.8 mg/dL (ref 0.6–1.1)
GLUCOSE: 108 mg/dL (ref 70–140)
POTASSIUM: 3.4 meq/L — AB (ref 3.5–5.1)
SODIUM: 142 meq/L (ref 136–145)
TOTAL PROTEIN: 7.7 g/dL (ref 6.4–8.3)

## 2014-02-12 NOTE — Progress Notes (Signed)
[facility] FOLLOW-UP progress notes  Patient Care Team: Ricci Barker, PA-C as PCP - General Artis Delay, MD as Consulting Physician (Hematology and Oncology)  CHIEF COMPLAINTS/PURPOSE OF VISIT:  Chronic leukocytosis and microcytosis  HISTORY OF PRESENTING ILLNESS:  Amber [name] 43 y.o. female was transferred to my care after her prior physician has left.  I reviewed the patient's records extensive and collaborated the history with the patient. Summary of her history is as follows: This patient was seen by another hematologist for fluctuation of white blood cell count and microcytosis. A year ago, she was noted to have a mass in the right shoulder, resembling a cysts. She had surgical resection. She denies any recent infection. She does not smoke. She continue to take iron supplements for microcytosis.  MEDICAL HISTORY:  Past Medical History  Diagnosis Date  . Leukocytosis, unspecified 01/09/2013  . HTN (hypertension)     SURGICAL HISTORY: Past Surgical History  Procedure Laterality Date  . Cesarean section      x2  . Mass excision Right 01/18/2013    Procedure: EXCISION right scapular cyst;  Surgeon: Axel Filler, MD;  Location: [facility];  Service: General;  Laterality: Right;    SOCIAL HISTORY: History   Social History  . Marital Status: Married    Spouse Name: N/A    Number of Children: 2  . Years of Education: N/A   Occupational History  .      front desk    Social History Main Topics  . Smoking status: Never Smoker   . Smokeless tobacco: Never Used  . Alcohol Use: Yes     Comment: rare  . Drug Use: No  . Sexual Activity: Not on file   Other Topics Concern  . Not on file   Social History Narrative  . No narrative on file    FAMILY HISTORY: Family History  Problem Relation Age of Onset  . Diabetes Father   . Diabetes Mother   . Heart disease Mother   . Diabetes Sister   . Hypertension Sister     ALLERGIES:  has No Known  Allergies.  MEDICATIONS:  Current Outpatient Prescriptions  Medication Sig Dispense Refill  . Cholecalciferol (VITAMIN D3) 2000 UNITS TABS Take 1 tablet by mouth every morning.       . ferrous sulfate 325 (65 FE) MG tablet Take 325 mg by mouth every morning.        No current facility-administered medications for this visit.    REVIEW OF SYSTEMS:   Constitutional: Denies fevers, chills or abnormal night sweats Eyes: Denies blurriness of vision, double vision or watery eyes Ears, nose, mouth, throat, and face: Denies mucositis or sore throat Respiratory: Denies cough, dyspnea or wheezes Cardiovascular: Denies palpitation, chest discomfort or lower extremity swelling Gastrointestinal:  Denies nausea, heartburn or change in bowel habits Skin: Denies abnormal skin rashes Lymphatics: Denies new lymphadenopathy or easy bruising Neurological:Denies numbness, tingling or new weaknesses Behavioral/Psych: Mood is stable, no new changes  All other systems were reviewed with the patient and are negative.  PHYSICAL EXAMINATION: ECOG PERFORMANCE STATUS: 0 - Asymptomatic  Filed Vitals:   02/12/14 0939  BP: 145/95  Pulse: 70  Temp: 99.1 F (37.3 C)  Resp: 18   Filed Weights   02/12/14 0939  Weight: 209 lb 14.4 oz (95.21 kg)    GENERAL:alert, no distress and comfortable. The patient is obese SKIN: skin color, texture, turgor are normal, no rashes or significant lesions EYES: normal, conjunctiva are  pink and non-injected, sclera clear OROPHARYNX:no exudate, normal lips, buccal mucosa, and tongue  NECK: supple, thyroid normal size, non-tender, without nodularity LYMPH:  no palpable lymphadenopathy in the cervical, axillary or inguinal LUNGS: clear to auscultation and percussion with normal breathing effort HEART: regular rate & rhythm and no murmurs without lower extremity edema ABDOMEN:abdomen soft, non-tender and normal bowel sounds Musculoskeletal:no cyanosis of digits and no  clubbing  PSYCH: alert & oriented x 3 with fluent speech NEURO: no focal motor/sensory deficits  LABORATORY DATA:  I have reviewed the data as listed Lab Results  Component Value Date   WBC 11.4* 02/12/2014   HGB 13.5 02/12/2014   HCT 38.2 02/12/2014   MCV 77.0* 02/12/2014   PLT 351 02/12/2014    Recent Labs  02/12/14 0809  NA 142  K 3.4*  CO2 23  GLUCOSE 108  BUN 13.1  CREATININE 0.8  CALCIUM 9.7  PROT 7.7  ALBUMIN 3.9  AST 17  ALT 11  ALKPHOS 47  BILITOT 0.38   ASSESSMENT & PLAN:  #1 chronic leukocytosis Her white blood cell count in July 2014 were normal. I suspect this is benign. I do not recommend followup here or further workup as she is not symptomatic and the leukocytosis is mild #2 microcytosis The patient could be iron deficient or that this could be related to thalassemia. I recommend she have her blood count rechecked with her primary care physician in 6 months. I have not make a return appointment for the patient to come back.  All questions were answered. The patient knows to call the clinic with any problems, questions or concerns. I spent 15 minutes counseling the patient face to face. The total time spent in the appointment was 20 minutes and more than 50% was on counseling.     Artis DelayNi Adarius Tigges, MD 02/12/2014 4:14 PM

## 2014-10-29 ENCOUNTER — Other Ambulatory Visit: Payer: Self-pay

## 2014-10-29 DIAGNOSIS — Z1231 Encounter for screening mammogram for malignant neoplasm of breast: Secondary | ICD-10-CM

## 2014-11-05 ENCOUNTER — Ambulatory Visit
Admission: RE | Admit: 2014-11-05 | Discharge: 2014-11-05 | Disposition: A | Discharge status: Home / Self Care | Payer: Private Health Insurance - Indemnity | Source: Ambulatory Visit

## 2014-11-05 DIAGNOSIS — Z1231 Encounter for screening mammogram for malignant neoplasm of breast: Secondary | ICD-10-CM

## 2014-11-10 ENCOUNTER — Other Ambulatory Visit: Payer: Self-pay | Admitting: Physician Assistant

## 2014-11-10 DIAGNOSIS — R928 Other abnormal and inconclusive findings on diagnostic imaging of breast: Secondary | ICD-10-CM

## 2014-12-05 ENCOUNTER — Ambulatory Visit
Admission: RE | Admit: 2014-12-05 | Discharge: 2014-12-05 | Disposition: A | Discharge status: Home / Self Care | Payer: Private Health Insurance - Indemnity | Source: Ambulatory Visit | Attending: Physician Assistant | Admitting: Physician Assistant

## 2014-12-05 DIAGNOSIS — R928 Other abnormal and inconclusive findings on diagnostic imaging of breast: Secondary | ICD-10-CM

## 2014-12-26 DIAGNOSIS — R7611 Nonspecific reaction to tuberculin skin test without active tuberculosis: Secondary | ICD-10-CM

## 2014-12-26 HISTORY — DX: Nonspecific reaction to tuberculin skin test without active tuberculosis: R76.11

## 2015-01-01 ENCOUNTER — Ambulatory Visit
Admission: RE | Admit: 2015-01-01 | Discharge: 2015-01-01 | Disposition: A | Discharge status: Home / Self Care | Payer: No Typology Code available for payment source | Source: Ambulatory Visit | Attending: Infectious Disease | Admitting: Infectious Disease

## 2015-01-01 ENCOUNTER — Other Ambulatory Visit: Payer: Self-pay | Admitting: Infectious Disease

## 2015-01-01 DIAGNOSIS — Z111 Encounter for screening for respiratory tuberculosis: Secondary | ICD-10-CM

## 2015-06-19 ENCOUNTER — Other Ambulatory Visit: Payer: Self-pay | Admitting: Physician Assistant

## 2015-06-19 DIAGNOSIS — R922 Inconclusive mammogram: Secondary | ICD-10-CM

## 2015-06-26 ENCOUNTER — Ambulatory Visit
Admission: RE | Admit: 2015-06-26 | Discharge: 2015-06-26 | Disposition: A | Discharge status: Home / Self Care | Payer: Managed Care, Other (non HMO) | Source: Ambulatory Visit | Attending: Physician Assistant | Admitting: Physician Assistant

## 2015-06-26 DIAGNOSIS — R922 Inconclusive mammogram: Secondary | ICD-10-CM

## 2015-08-20 ENCOUNTER — Ambulatory Visit (INDEPENDENT_AMBULATORY_CARE_PROVIDER_SITE_OTHER): Payer: Managed Care, Other (non HMO) | Admitting: Primary Care

## 2015-08-20 ENCOUNTER — Encounter: Payer: Self-pay | Admitting: Primary Care

## 2015-08-20 VITALS — BP 148/96 | HR 86 | Temp 97.5°F | Ht 63.0 in | Wt 227.1 lb

## 2015-08-20 DIAGNOSIS — I10 Essential (primary) hypertension: Secondary | ICD-10-CM

## 2015-08-20 DIAGNOSIS — Z23 Encounter for immunization: Secondary | ICD-10-CM | POA: Diagnosis not present

## 2015-08-20 NOTE — Progress Notes (Signed)
Pre visit review using our clinic review tool, if applicable. No additional management support is needed unless otherwise documented below in the visit note. 

## 2015-08-20 NOTE — Assessment & Plan Note (Signed)
Currently managed on valsartan-HCTZ 320/12.5 and amlodipine 5 mg. Elevated BP today, will have her send me daily readings for 2 weeks in 2 weeks. Asymptomatic.

## 2015-08-20 NOTE — Patient Instructions (Addendum)
Please schedule a physical with me in 3 months. You will also schedule a lab only appointment one week prior. We will discuss your lab results during your physical.  Check your blood pressure daily, around the same time of day, for the next 2 weeks.   Ensure that you have rested for 30 minutes prior to checking your blood pressure. Record your readings call them into me in 2 weeks.  It was a pleasure to meet you today! Please don't hesitate to call me with any questions. Welcome to [facility]!

## 2015-08-20 NOTE — Progress Notes (Signed)
Subjective:    Patient ID: Amber [name], female    DOB: 05-24-1971, 44 y.o.   MRN: [id number]  HPI  Amber [name] is a 44 year old female who presents today to establish care and discuss the problems mentioned below. Will obtain old records. Her last physical was about one year ago.   1) Essential Hypertension: Diagnosed years ago. Currently managed on amlodipine 5 mg and valsartan-HCTZ 320-12.5 mg. Her blood pressure was last checked 1 month ago and was "high" per patient. Denies headaches, dizziness, chest pain. Elevated in the clinic today. She endorses compliance to her medications.  2) Tuberculosis: Managed on isoniazid 300 mg currently which was started on 01/19/15. 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 She was managed by the [facility] and was determined to have no active disease. Denies cough, chest pain, night sweats, fevers.  Review of Systems  Constitutional: Negative for unexpected weight change.  HENT: Negative for rhinorrhea.   Respiratory: Negative for cough and shortness of breath.   Cardiovascular: Negative for chest pain.  Gastrointestinal: Negative for diarrhea and constipation.  Genitourinary: Negative for difficulty urinating.       Regular periods  Musculoskeletal: Negative for myalgias and arthralgias.  Skin: Negative for rash.  Neurological: Negative for dizziness, numbness and headaches.  Psychiatric/Behavioral:       Denies concerns for anxiety or depression       Past Medical History  Diagnosis Date  . Leukocytosis, unspecified 01/09/2013  . HTN (hypertension)     Social History   Social History  . Marital Status: Married    Spouse Name: Amber [name]  . Number of Children: 2  . Years of Education: Amber [name]   Occupational History  .      front desk    Social History Main Topics  . Smoking status: Never Smoker   . Smokeless tobacco: Never Used  . Alcohol Use: 0.0 oz/week    0 Standard drinks or equivalent  per week     Comment: rare  . Drug Use: No  . Sexual Activity: Not on file   Other Topics Concern  . Not on file   Social History Narrative   From [place].   Married.   2 children.   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    Past Surgical History  Procedure Laterality Date  . Cesarean section      x2  . Mass excision Right 01/18/2013    Procedure: EXCISION right scapular cyst;  Surgeon: Axel Filler, MD;  Location: [facility];  Service: General;  Laterality: Right;    Family History  Problem Relation Age of Onset  . Diabetes Father   . Diabetes Mother   . Heart disease Mother   . Diabetes Sister   . Hypertension Sister     No Known Allergies  Current Outpatient Prescriptions on File Prior to Visit  Medication Sig Dispense Refill  . ferrous sulfate 325 (65 FE) MG tablet Take 325 mg by mouth every morning.      No current facility-administered medications on file prior to visit.    BP 148/96 mmHg  Pulse 86  Temp(Src) 97.5 F (36.4 C) (Oral)  Ht 5\' 3"  (1.6 m)  Wt 227 lb 1.9 oz (103.021 kg)  BMI 40.24 kg/m2  SpO2 97%  LMP 08/14/2015     Objective:   Physical Exam  Constitutional: She appears well-nourished.  HENT:  Right Ear: Tympanic membrane and ear canal normal.  Left Ear: Tympanic membrane and ear canal normal.  Nose: Nose normal.  Mouth/Throat: Oropharynx is clear and moist.  Eyes: Conjunctivae are normal. Pupils are equal, round, and reactive to light.  Neck: Neck supple.  Cardiovascular: Normal rate and regular rhythm.   Pulmonary/Chest: Effort normal and breath sounds normal.  Lymphadenopathy:    She has no cervical adenopathy.  Skin: Skin is warm and dry.          Assessment & Plan:

## 2015-09-06 ENCOUNTER — Telehealth: Payer: Self-pay | Admitting: Primary Care

## 2015-09-06 NOTE — Telephone Encounter (Signed)
Will you please ask Amber [name] how her BP readings have been over the past several weeks?

## 2015-09-07 ENCOUNTER — Telehealth: Payer: Self-pay | Admitting: Primary Care

## 2015-09-07 NOTE — Telephone Encounter (Signed)
Tried to call patient at home number but phone kept ringing. Will try again later.

## 2015-09-07 NOTE — Telephone Encounter (Signed)
Noted. I would like to see her in 3 months for follow up of hypertension. We also need to complete a physical, so we can do both in one visit if she prefers. Will you please schedule? Thanks.

## 2015-09-07 NOTE — Telephone Encounter (Signed)
Patient returned Chan's call. Patient said her blood pressure reading today was 142/90.  Patient said most of the time for the past couple weeks it has been around 135/81.

## 2015-09-08 NOTE — Telephone Encounter (Signed)
Left detail message per DPR for patient to call back and schedule appt.

## 2015-09-09 NOTE — Telephone Encounter (Signed)
Sending letter for patient to schedule appointment.

## 2015-09-30 ENCOUNTER — Other Ambulatory Visit: Payer: Self-pay

## 2015-09-30 DIAGNOSIS — Z1231 Encounter for screening mammogram for malignant neoplasm of breast: Secondary | ICD-10-CM

## 2015-11-09 ENCOUNTER — Other Ambulatory Visit: Payer: Self-pay | Admitting: Primary Care

## 2015-11-09 ENCOUNTER — Ambulatory Visit
Admission: RE | Admit: 2015-11-09 | Discharge: 2015-11-09 | Disposition: A | Discharge status: Home / Self Care | Payer: Managed Care, Other (non HMO) | Source: Ambulatory Visit

## 2015-11-09 DIAGNOSIS — E785 Hyperlipidemia, unspecified: Secondary | ICD-10-CM

## 2015-11-09 DIAGNOSIS — Z0000 Encounter for general adult medical examination without abnormal findings: Secondary | ICD-10-CM

## 2015-11-09 DIAGNOSIS — Z1231 Encounter for screening mammogram for malignant neoplasm of breast: Secondary | ICD-10-CM

## 2015-11-09 DIAGNOSIS — I10 Essential (primary) hypertension: Secondary | ICD-10-CM

## 2015-11-18 ENCOUNTER — Other Ambulatory Visit: Payer: Managed Care, Other (non HMO)

## 2015-11-25 ENCOUNTER — Encounter: Payer: Managed Care, Other (non HMO) | Admitting: Primary Care

## 2015-11-25 ENCOUNTER — Other Ambulatory Visit (INDEPENDENT_AMBULATORY_CARE_PROVIDER_SITE_OTHER): Payer: Managed Care, Other (non HMO)

## 2015-11-25 DIAGNOSIS — I10 Essential (primary) hypertension: Secondary | ICD-10-CM

## 2015-11-25 DIAGNOSIS — E785 Hyperlipidemia, unspecified: Secondary | ICD-10-CM

## 2015-11-25 DIAGNOSIS — Z0000 Encounter for general adult medical examination without abnormal findings: Secondary | ICD-10-CM | POA: Diagnosis not present

## 2015-11-25 LAB — LIPID PANEL
CHOL/HDL RATIO: 4
CHOLESTEROL: 220 mg/dL — AB (ref 0–200)
HDL: 53.5 mg/dL (ref >39.00)
LDL Cholesterol: 136 mg/dL — ABNORMAL HIGH (ref 0–99)
NonHDL: 166.82
TRIGLYCERIDES: 155 mg/dL — AB (ref 0.0–149.0)
VLDL: 31 mg/dL (ref 0.0–40.0)

## 2015-11-25 LAB — CBC
HEMATOCRIT: 37.8 % (ref 36.0–46.0)
HEMOGLOBIN: 12.8 g/dL (ref 12.0–15.0)
MCHC: 34 g/dL (ref 30.0–36.0)
MCV: 79.1 fl (ref 78.0–100.0)
PLATELETS: 299 10*3/uL (ref 150.0–400.0)
RBC: 4.78 Mil/uL (ref 3.87–5.11)
RDW: 16.2 % — ABNORMAL HIGH (ref 11.5–15.5)
WBC: 12.3 10*3/uL — AB (ref 4.0–10.5)

## 2015-11-25 LAB — COMPREHENSIVE METABOLIC PANEL
ALK PHOS: 47 U/L (ref 39–117)
ALT: 26 U/L (ref 0–35)
AST: 29 U/L (ref 0–37)
Albumin: 4.2 g/dL (ref 3.5–5.2)
BILIRUBIN TOTAL: 0.4 mg/dL (ref 0.2–1.2)
BUN: 20 mg/dL (ref 6–23)
CALCIUM: 9.5 mg/dL (ref 8.4–10.5)
CO2: 27 mEq/L (ref 19–32)
Chloride: 100 mEq/L (ref 96–112)
Creatinine, Ser: 0.79 mg/dL (ref 0.40–1.20)
GFR: 83.48 mL/min (ref >60.00)
Glucose, Bld: 110 mg/dL — ABNORMAL HIGH (ref 70–99)
POTASSIUM: 3.5 meq/L (ref 3.5–5.1)
Sodium: 139 mEq/L (ref 135–145)
TOTAL PROTEIN: 7.7 g/dL (ref 6.0–8.3)

## 2015-11-25 LAB — VITAMIN D 25 HYDROXY (VIT D DEFICIENCY, FRACTURES): VITD: 34.25 ng/mL (ref 30.00–100.00)

## 2015-11-25 LAB — HEMOGLOBIN A1C: HEMOGLOBIN A1C: 5.7 % (ref 4.6–6.5)

## 2015-12-04 ENCOUNTER — Encounter: Payer: Self-pay | Admitting: Primary Care

## 2015-12-04 ENCOUNTER — Ambulatory Visit (INDEPENDENT_AMBULATORY_CARE_PROVIDER_SITE_OTHER): Payer: Managed Care, Other (non HMO) | Admitting: Primary Care

## 2015-12-04 VITALS — BP 138/92 | HR 81 | Temp 98.0°F | Ht 63.0 in | Wt 235.1 lb

## 2015-12-04 DIAGNOSIS — I10 Essential (primary) hypertension: Secondary | ICD-10-CM

## 2015-12-04 DIAGNOSIS — Z0000 Encounter for general adult medical examination without abnormal findings: Secondary | ICD-10-CM | POA: Diagnosis not present

## 2015-12-04 DIAGNOSIS — E785 Hyperlipidemia, unspecified: Secondary | ICD-10-CM | POA: Diagnosis not present

## 2015-12-04 DIAGNOSIS — Z0001 Encounter for general adult medical examination with abnormal findings: Secondary | ICD-10-CM | POA: Insufficient documentation

## 2015-12-04 DIAGNOSIS — M79672 Pain in left foot: Secondary | ICD-10-CM | POA: Diagnosis not present

## 2015-12-04 MED ORDER — AMLODIPINE BESYLATE 10 MG PO TABS: 10.0000 mg | ORAL_TABLET | Freq: Every day | ORAL | Status: DC

## 2015-12-04 NOTE — Assessment & Plan Note (Signed)
Tdap and flu UTD. Pap and mammogram UTD. Poor diet. Discussed the importance of a healthy diet and regular exercise in order for weight loss and to reduce risk of other medical diseases. Exam unremarkable. Labs with hyperlipidemia which was discussed.  Repeat physical in 1 year.

## 2015-12-04 NOTE — Patient Instructions (Addendum)
Complete xray(s) prior to leaving today. I will notify you of your results once received.  It is important that you improve your diet. Please limit carbohydrates in the form of white bread, rice, pasta, fast food, sugary drinks, etc. Increase your consumption of fresh fruits and vegetables.  You need to consume about 2 liters of water daily.  Start exercising. You should be getting 1 hour of moderate intensity exercise 5 days weekly.  Your cholesterol is slightly elevated. This can be improved with healthy diet and exercise as mentioned above.  Your blood pressure is too high. We've increased your amlodipine from 5 mg to 10 mg. I've sent a new prescription for Amlodipine 10 mg to your pharmacy.   Check your blood pressure daily, around the same time of day, for the next 2 weeks.   Ensure that you have rested for 30 minutes prior to checking your blood pressure. Record your readings and call me in 2 weeks. Please notify me soon if your blood pressure gets below 90/60 or if you feel very dizzy/off balance.  Follow up in 3 months for re-evaluation of blood pressure.  It was a pleasure to see you today!    High Cholesterol High cholesterol refers to having a high level of cholesterol in your blood. Cholesterol is a white, waxy, fat-like protein that your body needs in small amounts. Your liver makes all the cholesterol you need. Excess cholesterol comes from the food you eat. Cholesterol travels in your bloodstream through your blood vessels. If you have high cholesterol, deposits (plaque) may build up on the walls of your blood vessels. This makes the arteries narrower and stiffer. Plaque increases your risk of heart attack and stroke. Work with your health care provider to keep your cholesterol levels in a healthy range. RISK FACTORS Several things can make you more likely to have high cholesterol. These include:   Eating foods high in animal fat (saturated fat) or cholesterol.  Being  overweight.  Not getting enough exercise.  Having a family history of high cholesterol. SIGNS AND SYMPTOMS High cholesterol does not cause symptoms. DIAGNOSIS  Your health care provider can do a blood test to check whether you have high cholesterol. If you are older than 20, your health care provider may check your cholesterol every 4-6 years. You may be checked more often if you already have high cholesterol or other risk factors for heart disease. The blood test for cholesterol measures the following:  Bad cholesterol (LDL cholesterol). This is the type of cholesterol that causes heart disease. This number should be less than 100.  Good cholesterol (HDL cholesterol). This type helps protect against heart disease. A healthy level of HDL cholesterol is 60 or higher.  Total cholesterol. This is the combined number of LDL cholesterol and HDL cholesterol. A healthy number is less than 200. TREATMENT  High cholesterol can be treated with diet changes, lifestyle changes, and medicine.   Diet changes may include eating more whole grains, fruits, vegetables, nuts, and fish. You may also have to cut back on red meat and foods with a lot of added sugar.  Lifestyle changes may include getting at least 40 minutes of aerobic exercise three times a week. Aerobic exercises include walking, biking, and swimming. Aerobic exercise along with a healthy diet can help you maintain a healthy weight. Lifestyle changes may also include quitting smoking.  If diet and lifestyle changes are not enough to lower your cholesterol, your health care provider may prescribe a  statin medicine. This medicine has been shown to lower cholesterol and also lower the risk of heart disease. HOME CARE INSTRUCTIONS  Only take over-the-counter or prescription medicines as directed by your health care provider.   Follow a healthy diet as directed by your health care provider. For instance:   Eat chicken (without skin), fish,  veal, shellfish, ground [place] breast, and round or loin cuts of red meat.  Do not eat fried foods and fatty meats, such as hot dogs and salami.   Eat plenty of fruits, such as apples.   Eat plenty of vegetables, such as broccoli, potatoes, and carrots.   Eat beans, peas, and lentils.   Eat grains, such as barley, rice, couscous, and bulgur wheat.   Eat pasta without cream sauces.   Use skim or nonfat milk and low-fat or nonfat yogurt and cheeses. Do not eat or drink whole milk, cream, ice cream, egg yolks, and hard cheeses.   Do not eat stick margarine or tub margarines that contain trans fats (also called partially hydrogenated oils).   Do not eat cakes, cookies, crackers, or other baked goods that contain trans fats.   Do not eat saturated tropical oils, such as coconut and palm oil.   Exercise as directed by your health care provider. Increase your activity level with activities such as gardening or walking.   Keep all follow-up appointments.  SEEK MEDICAL CARE IF:  You are struggling to maintain a healthy diet or weight.  You need help starting an exercise program.  You need help to stop smoking. SEEK IMMEDIATE MEDICAL CARE IF:  You have chest pain.  You have trouble breathing.   This information is not intended to replace advice given to you by your health care provider. Make sure you discuss any questions you have with your health care provider.   Document Released: 10/03/2005 Document Revised: 10/24/2014 Document Reviewed: 07/26/2013 Elsevier Interactive Patient Education [employer]

## 2015-12-04 NOTE — Progress Notes (Signed)
Subjective:    Patient ID: Amber [name], female    DOB: Jan 04, 1971, 45 y.o.   MRN: [id number]  HPI  Amber [name] is a 45 year old female who presents today for complete physical.  Immunizations: -Tetanus: Completed in March 2016 -Influenza: Completed in November 2016   Diet: Endorses a poor diet. She works 3rd shift at work.  Breakfast: Bread, noodles, soup Lunch: Rice mostly, little meat. Dinner: Fast food Snacks: None Desserts: Twice weekly Beverages: Sodas, little water  Exercise: She does not currently exercise. Eye exam: Completed January 2016. Dental exam: Completed 6 months ago. Pap Smear: Completed 2 years ago. Mammogram: January 2017  1) Foot Pain: Located to left heel of foot. Pain has been present for about 6 months. She notices her pain first thing in the morning when rising from sleep, and is worse upon ambulation. She's not taken anything OTC for her pain. She can feel a "bump" to the heel occasionally. Denies erythema, swelling, bruising, recent injury.  Review of Systems  Constitutional: Negative for unexpected weight change.  HENT: Negative for rhinorrhea.   Respiratory: Negative for shortness of breath.   Cardiovascular: Negative for chest pain.  Gastrointestinal: Negative for diarrhea and constipation.  Genitourinary: Negative for difficulty urinating.  Musculoskeletal:       Left foot pain.  Skin: Negative for rash.  Allergic/Immunologic: Negative for environmental allergies.  Neurological: Negative for dizziness, numbness and headaches.  Psychiatric/Behavioral:       PHQ 9 score of 12. Works 3rd shift, feels tired all of the time. Doesn't feel depressed. She enjoys working on third shift.       Past Medical History  Diagnosis Date  . 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 HTN (hypertension)     Social History   Social History  . Marital Status: Married    Spouse Name: N/A  . Number of Children: 2  . Years of Education: N/A   Occupational  History  .      front desk    Social History Main Topics  . Smoking status: Never Smoker   . Smokeless tobacco: Never Used  . Alcohol Use: 0.0 oz/week    0 Standard drinks or equivalent per week     Comment: rare  . Drug Use: No  . Sexual Activity: Not on file   Other Topics Concern  . Not on file   Social History Narrative   From [place].   Married.   2 children.   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    Past Surgical History  Procedure Laterality Date  . Cesarean section      x2  . Mass excision Right 01/18/2013    Procedure: EXCISION right scapular cyst;  Surgeon: Axel Filler, MD;  Location: [facility];  Service: General;  Laterality: Right;    Family History  Problem Relation Age of Onset  . Diabetes Father   . Diabetes Mother   . Heart disease Mother   . Diabetes Sister   . Hypertension Sister     No Known Allergies  Current Outpatient Prescriptions on File Prior to Visit  Medication Sig Dispense Refill  . ferrous sulfate 325 (65 FE) MG tablet Take 325 mg by mouth every morning.     . isoniazid (NYDRAZID) 300 MG tablet Take 150 mg by mouth daily.    . valsartan-hydrochlorothiazide (DIOVAN-HCT) 320-12.5 MG tablet Take 1 tablet by mouth daily.    Marland Kitchen  vitamin B-6 (PYRIDOXINE) 25 MG tablet Take 25 mg by mouth daily.     No current facility-administered medications on file prior to visit.    BP 138/92 mmHg  Pulse 81  Temp(Src) 98 F (36.7 C) (Oral)  Ht  (1.6 m)  Wt 235 lb 1.9 oz (106.65 kg)  BMI 41.66 kg/m2  SpO2 99%  LMP 11/27/2015    Objective:   Physical Exam  Constitutional: She is oriented to person, place, and time. She appears well-nourished.  HENT:  Right Ear: Tympanic membrane and ear canal normal.  Left Ear: Tympanic membrane and ear canal normal.  Nose: Nose normal.  Mouth/Throat: Oropharynx is clear and moist.  Eyes: Conjunctivae and EOM are normal. Pupils are equal,  round, and reactive to light.  Neck: Neck supple. No thyromegaly present.  Cardiovascular: Normal rate and regular rhythm.   No murmur heard. Pulmonary/Chest: Effort normal and breath sounds normal. She has no rales.  Abdominal: Soft. Bowel sounds are normal. There is no tenderness.  Musculoskeletal: Normal range of motion.  No abnormality noted to left foot/area of concern.  Lymphadenopathy:    She has no cervical adenopathy.  Neurological: She is alert and oriented to person, place, and time. She has normal reflexes. No cranial nerve deficit.  Skin: Skin is warm and dry. No rash noted.  Psychiatric: She has a normal mood and affect.          Assessment & Plan:  Foot Pain:  Located to heel of left foot x 6 months. History of prior which dissipated. Suspect bone spur based off of HPI. Exam unremarkable. Will have her trial ibuprofen PRN. Xray ordered and is pending.

## 2015-12-04 NOTE — Assessment & Plan Note (Addendum)
She's checking her BP at home which is running 130/80-90's.  Above goal today. Increase amlodipine to 10 mg. New RX sent to pharmacy.

## 2015-12-04 NOTE — Assessment & Plan Note (Signed)
Slightly above goal. Discussed the importance of a healthy diet and regular exercise in order for weight loss and to reduce risk of other medical diseases. Education provided regarding specific changes to her diet. Will recheck in 6 months.

## 2015-12-04 NOTE — Progress Notes (Signed)
Pre visit review using our clinic review tool, if applicable. No additional management support is needed unless otherwise documented below in the visit note. 

## 2016-03-02 ENCOUNTER — Ambulatory Visit (INDEPENDENT_AMBULATORY_CARE_PROVIDER_SITE_OTHER): Payer: Managed Care, Other (non HMO) | Admitting: Primary Care

## 2016-03-02 VITALS — BP 140/92 | HR 68 | Temp 97.7°F | Ht 63.0 in | Wt 229.1 lb

## 2016-03-02 DIAGNOSIS — I10 Essential (primary) hypertension: Secondary | ICD-10-CM

## 2016-03-02 NOTE — Progress Notes (Signed)
Subjective:    Patient ID: Amber [name], female    DOB: December 02, 1970, 45 y.o.   MRN: [id number]  HPI  Amber [name] is a 45 year old female who presents today for follow up.  1) Essential Hypertension: Currently managed on Amlodipine 10 mg and valsartan-HCTZ 320/12.5 mg. Her Amlodipine was increased last visit from 5 mg to 10 mg. Her BP in the office today is slightly above goal at 140/92.  She is checking her blood pressure at home which is running in the 130's/90's. She is working to lose weight through improvements in her diet. She's been reducing consumption of pastas, rice, flour, baked goods. She also plans on starting to exercise as she's joined a local gym.  Denies chest pain, dizziness, changes in vision.  Wt Readings from Last 3 Encounters:  03/02/16 229 lb 1.9 oz (103.928 kg)  12/04/15 235 lb 1.9 oz (106.65 kg)  08/20/15 227 lb 1.9 oz (103.021 kg)     Review of Systems  Eyes: Negative for visual disturbance.  Respiratory: Negative for shortness of breath.   Cardiovascular: Negative for chest pain.  Neurological: Negative for dizziness and headaches.       Past Medical History  Diagnosis Date  . 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 HTN (hypertension)      Social History   Social History  . Marital Status: Married    Spouse Name: N/A  . Number of Children: 2  . Years of Education: N/A   Occupational History  .      front desk    Social History Main Topics  . Smoking status: Never Smoker   . Smokeless tobacco: Never Used  . Alcohol Use: 0.0 oz/week    0 Standard drinks or equivalent per week     Comment: rare  . Drug Use: No  . Sexual Activity: Not on file   Other Topics Concern  . Not on file   Social History Narrative   From [place].   Married.   2 children.   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    Past Surgical History  Procedure Laterality Date  . Cesarean section      x2  .  Mass excision Right 01/18/2013    Procedure: EXCISION right scapular cyst;  Surgeon: Axel Filler, MD;  Location: [facility];  Service: General;  Laterality: Right;    Family History  Problem Relation Age of Onset  . Diabetes Father   . Diabetes Mother   . Heart disease Mother   . Diabetes Sister   . Hypertension Sister     No Known Allergies  Current Outpatient Prescriptions on File Prior to Visit  Medication Sig Dispense Refill  . amLODipine (NORVASC) 10 MG tablet Take 1 tablet (10 mg total) by mouth daily. 30 tablet 3  . ferrous sulfate 325 (65 FE) MG tablet Take 325 mg by mouth every morning.     . isoniazid (NYDRAZID) 300 MG tablet Take 150 mg by mouth daily.    . valsartan-hydrochlorothiazide (DIOVAN-HCT) 320-12.5 MG tablet Take 1 tablet by mouth daily.    . vitamin B-6 (PYRIDOXINE) 25 MG tablet Take 25 mg by mouth daily.     No current facility-administered medications on file prior to visit.    BP 140/92 mmHg  Pulse 68  Temp(Src) 97.7 F (36.5 C) (Oral)  Ht 5\' 3"  (1.6 m)  Wt 229 lb 1.9 oz (103.928 kg)  BMI  40.60 kg/m2  SpO2 99%  LMP 02/14/2016    Objective:   Physical Exam  Constitutional: She appears well-nourished.  Cardiovascular: Normal rate and regular rhythm.   Pulmonary/Chest: Effort normal and breath sounds normal.  Skin: Skin is warm and dry.          Assessment & Plan:

## 2016-03-02 NOTE — Progress Notes (Signed)
Pre visit review using our clinic review tool, if applicable. No additional management support is needed unless otherwise documented below in the visit note. 

## 2016-03-02 NOTE — Patient Instructions (Signed)
Your blood pressure is slightly above goal.  Continue your efforts towards weight loss through a healthy diet and routine exercise.  Start exercising. You should be getting 1 hour of moderate intensity exercise 5 days weekly.  Continue to reduce consumption of rice, pasta, processed carbohydrates, sweets, fast foods.  Reduce foods that are high is salt content. Take a look at the information below.  Follow up in 3 months for re-evaluation of your blood pressure.   It was a pleasure to see you today!  DASH Eating Plan DASH stands for "Dietary Approaches to Stop Hypertension." The DASH eating plan is a healthy eating plan that has been shown to reduce high blood pressure (hypertension). Additional health benefits may include reducing the risk of type 2 diabetes mellitus, heart disease, and stroke. The DASH eating plan may also help with weight loss. WHAT DO I NEED TO KNOW ABOUT THE DASH EATING PLAN? For the DASH eating plan, you will follow these general guidelines:  Choose foods with a percent daily value for sodium of less than 5% (as listed on the food label).  Use salt-free seasonings or herbs instead of table salt or sea salt.  Check with your health care provider or pharmacist before using salt substitutes.  Eat lower-sodium products, often labeled as "lower sodium" or "no salt added."  Eat fresh foods.  Eat more vegetables, fruits, and low-fat dairy products.  Choose whole grains. Look for the word "whole" as the first word in the ingredient list.  Choose fish and skinless chicken or [place] more often than red meat. Limit fish, poultry, and meat to 6 oz (170 g) each day.  Limit sweets, desserts, sugars, and sugary drinks.  Choose heart-healthy fats.  Limit cheese to 1 oz (28 g) per day.  Eat more home-cooked food and less restaurant, buffet, and fast food.  Limit fried foods.  Cook foods using methods other than frying.  Limit canned vegetables. If you do use them,  rinse them well to decrease the sodium.  When eating at a restaurant, ask that your food be prepared with less salt, or no salt if possible. WHAT FOODS CAN I EAT? Seek help from a dietitian for individual calorie needs. Grains Whole grain or whole wheat bread. Brown rice. Whole grain or whole wheat pasta. Quinoa, bulgur, and whole grain cereals. Low-sodium cereals. Corn or whole wheat flour tortillas. Whole grain cornbread. Whole grain crackers. Low-sodium crackers. Vegetables Fresh or frozen vegetables (raw, steamed, roasted, or grilled). Low-sodium or reduced-sodium tomato and vegetable juices. Low-sodium or reduced-sodium tomato sauce and paste. Low-sodium or reduced-sodium canned vegetables.  Fruits All fresh, canned (in natural juice), or frozen fruits. Meat and Other Protein Products Ground beef (85% or leaner), grass-fed beef, or beef trimmed of fat. Skinless chicken or [place]. Ground chicken or [place]. Pork trimmed of fat. All fish and seafood. Eggs. Dried beans, peas, or lentils. Unsalted nuts and seeds. Unsalted canned beans. Dairy Low-fat dairy products, such as skim or 1% milk, 2% or reduced-fat cheeses, low-fat ricotta or cottage cheese, or plain low-fat yogurt. Low-sodium or reduced-sodium cheeses. Fats and Oils Tub margarines without trans fats. Light or reduced-fat mayonnaise and salad dressings (reduced sodium). Avocado. Safflower, olive, or canola oils. Natural peanut or almond butter. Other Unsalted popcorn and pretzels. The items listed above may not be a complete list of recommended foods or beverages. Contact your dietitian for more options. WHAT FOODS ARE NOT RECOMMENDED? Grains White bread. White pasta. White rice. Refined cornbread. Bagels and croissants.  Crackers that contain trans fat. Vegetables Creamed or fried vegetables. Vegetables in a cheese sauce. Regular canned vegetables. Regular canned tomato sauce and paste. Regular tomato and vegetable  juices. Fruits Dried fruits. Canned fruit in light or heavy syrup. Fruit juice. Meat and Other Protein Products Fatty cuts of meat. Ribs, chicken wings, bacon, sausage, bologna, salami, chitterlings, fatback, hot dogs, bratwurst, and packaged luncheon meats. Salted nuts and seeds. Canned beans with salt. Dairy Whole or 2% milk, cream, half-and-half, and cream cheese. Whole-fat or sweetened yogurt. Full-fat cheeses or blue cheese. Nondairy creamers and whipped toppings. Processed cheese, cheese spreads, or cheese curds. Condiments Onion and garlic salt, seasoned salt, table salt, and sea salt. Canned and packaged gravies. Worcestershire sauce. Tartar sauce. Barbecue sauce. Teriyaki sauce. Soy sauce, including reduced sodium. Steak sauce. Fish sauce. Oyster sauce. Cocktail sauce. Horseradish. Ketchup and mustard. Meat flavorings and tenderizers. Bouillon cubes. Hot sauce. Tabasco sauce. Marinades. Taco seasonings. Relishes. Fats and Oils Butter, stick margarine, lard, shortening, ghee, and bacon fat. Coconut, palm kernel, or palm oils. Regular salad dressings. Other Pickles and olives. Salted popcorn and pretzels. The items listed above may not be a complete list of foods and beverages to avoid. Contact your dietitian for more information. WHERE CAN I FIND MORE INFORMATION? National Heart, Lung, and Blood Institute: [employer]   This information is not intended to replace advice given to you by your health care provider. Make sure you discuss any questions you have with your health care provider.   Document Released: 09/22/2011 Document Revised: 10/24/2014 Document Reviewed: 08/07/2013 Elsevier Interactive Patient Education [employer]

## 2016-03-02 NOTE — Assessment & Plan Note (Signed)
Above goal with increase in Amlodipine to 10 mg. She is working to lose weight through healthy lifestyle changes. Will allow her 3 months to work on improvements in BP through weight reduction. If no improvement by next visit, will add in another medication. Information provided regarding low salt, healthy diet.

## 2016-06-02 ENCOUNTER — Encounter: Payer: Self-pay | Admitting: Primary Care

## 2016-06-02 ENCOUNTER — Ambulatory Visit (INDEPENDENT_AMBULATORY_CARE_PROVIDER_SITE_OTHER): Payer: Managed Care, Other (non HMO) | Admitting: Primary Care

## 2016-06-02 VITALS — BP 126/92 | HR 75 | Temp 98.2°F | Ht 63.0 in | Wt 230.8 lb

## 2016-06-02 DIAGNOSIS — E785 Hyperlipidemia, unspecified: Secondary | ICD-10-CM | POA: Diagnosis not present

## 2016-06-02 DIAGNOSIS — I10 Essential (primary) hypertension: Secondary | ICD-10-CM

## 2016-06-02 LAB — BASIC METABOLIC PANEL
BUN: 13 mg/dL (ref 6–23)
CALCIUM: 9.2 mg/dL (ref 8.4–10.5)
CO2: 28 mEq/L (ref 19–32)
Chloride: 103 mEq/L (ref 96–112)
Creatinine, Ser: 0.76 mg/dL (ref 0.40–1.20)
GFR: 87.1 mL/min (ref >60.00)
GLUCOSE: 105 mg/dL — AB (ref 70–99)
Potassium: 3.5 mEq/L (ref 3.5–5.1)
Sodium: 138 mEq/L (ref 135–145)

## 2016-06-02 LAB — LIPID PANEL
CHOL/HDL RATIO: 4
Cholesterol: 213 mg/dL — ABNORMAL HIGH (ref 0–200)
HDL: 50.4 mg/dL (ref >39.00)
LDL Cholesterol: 134 mg/dL — ABNORMAL HIGH (ref 0–99)
NONHDL: 162.73
Triglycerides: 145 mg/dL (ref 0.0–149.0)
VLDL: 29 mg/dL (ref 0.0–40.0)

## 2016-06-02 MED ORDER — AMLODIPINE BESYLATE 10 MG PO TABS: 10.0000 mg | ORAL_TABLET | Freq: Every day | ORAL | 3 refills | Status: DC

## 2016-06-02 MED ORDER — VALSARTAN-HYDROCHLOROTHIAZIDE 320-12.5 MG PO TABS: 1.0000 | ORAL_TABLET | Freq: Every day | ORAL | 3 refills | Status: DC

## 2016-06-02 NOTE — Progress Notes (Signed)
Subjective:    Patient ID: Amber [name], female    DOB: November 05, 1970, 45 y.o.   MRN: [id number]  HPI  Amber [name] is a 45 year old female who presents today for follow up.  1) Essential Hypertension: Currently managed on Amlodipine 10 mg and Diovan 320/12.5 mg tablets. Her BP has historically been above goal in the 130-140's/90's. Her BP today in the clinic is above goal at 126/92. Last visit she was working to lose weight through healthy lifestyle changes.  Since her last visit she's checking her BP at home and is getting readings of 130's/80's-90's (mostly 80's). She has since stopped watching her diet, but is determined to get back on track. She's recently switched from night shift to mid-afternoon shift which has improved her energy levels. She is not currently exercising. Denies chest pain, dizziness, visual changes.   BP Readings from Last 3 Encounters:  06/02/16 (!) 126/92  03/02/16 (!) 140/92  12/04/15 (!) 138/92     Wt Readings from Last 3 Encounters:  06/02/16 230 lb 12.8 oz (104.7 kg)  03/02/16 229 lb 1.9 oz (103.9 kg)  12/04/15 235 lb 1.9 oz (106.6 kg)     2) Hyperlipidemia: TC, Trigs, and LDL above goal 6 months ago. She was encouraged to make improvements in diet and to start exercising in order to lower these levels. She is due for repeat lipid panel today. She has not been watching her diet over the past several months, but is motivated to do so now.  Review of Systems  Respiratory: Negative for shortness of breath.   Cardiovascular: Negative for chest pain.  Neurological: Negative for dizziness and headaches.       Past Medical History:  Diagnosis Date  . HTN (hypertension)   . 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 Marital status: Married    Spouse name: N/A  . Number of children: 2  . Years of education: N/A   Occupational History  .      front desk    Social History Main Topics  . Smoking status: Never Smoker    . Smokeless tobacco: Never Used  . Alcohol use 0.0 oz/week     Comment: rare  . Drug use: No  . Sexual activity: Not on file   Other Topics Concern  . Not on file   Social History Narrative   From [place].   Married.   2 children.   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    Past Surgical History:  Procedure Laterality Date  . CESAREAN SECTION     x2  . MASS EXCISION Right 01/18/2013   Procedure: EXCISION right scapular cyst;  Surgeon: Axel Filler, MD;  Location: [facility];  Service: General;  Laterality: Right;    Family History  Problem Relation Age of Onset  . Diabetes Father   . Diabetes Mother   . Heart disease Mother   . Diabetes Sister   . Hypertension Sister     No Known Allergies  Current Outpatient Prescriptions on File Prior to Visit  Medication Sig Dispense Refill  . ferrous sulfate 325 (65 FE) MG tablet Take 325 mg by mouth every morning.      No current facility-administered medications on file prior to visit.     BP (!) 126/92   Pulse 75   Temp 98.2 F (36.8 C) (Oral)   Ht  5\' 3"  (1.6 m)   Wt 230 lb 12.8 oz (104.7 kg)   LMP 05/26/2016 (Within Days)   SpO2 97%   BMI 40.88 kg/m    Objective:   Physical Exam  Constitutional: She appears well-nourished.  Neck: Neck supple.  Cardiovascular: Normal rate and regular rhythm.   Pulmonary/Chest: Effort normal and breath sounds normal.  Skin: Skin is warm and dry.          Assessment & Plan:

## 2016-06-02 NOTE — Assessment & Plan Note (Signed)
Overall improved, slightly above goal in the clinic today, home readings mostly stable. Will continue to monitor and have her work on weight loss. Will re-evaluate in 6 months. BMP due today.

## 2016-06-02 NOTE — Patient Instructions (Addendum)
Complete lab work prior to leaving today. I will notify you of your results once received.   Continue to monitor your blood pressure. Please notify me of readings at or above 140/90 consistently.  Follow up in 6 months for your annual physical exam.  It was a pleasure to see you today!  DASH Eating Plan DASH stands for "Dietary Approaches to Stop Hypertension." The DASH eating plan is a healthy eating plan that has been shown to reduce high blood pressure (hypertension). Additional health benefits may include reducing the risk of type 2 diabetes mellitus, heart disease, and stroke. The DASH eating plan may also help with weight loss. WHAT DO I NEED TO KNOW ABOUT THE DASH EATING PLAN? For the DASH eating plan, you will follow these general guidelines:  Choose foods with a percent daily value for sodium of less than 5% (as listed on the food label).  Use salt-free seasonings or herbs instead of table salt or sea salt.  Check with your health care provider or pharmacist before using salt substitutes.  Eat lower-sodium products, often labeled as "lower sodium" or "no salt added."  Eat fresh foods.  Eat more vegetables, fruits, and low-fat dairy products.  Choose whole grains. Look for the word "whole" as the first word in the ingredient list.  Choose fish and skinless chicken or [place] more often than red meat. Limit fish, poultry, and meat to 6 oz (170 g) each day.  Limit sweets, desserts, sugars, and sugary drinks.  Choose heart-healthy fats.  Limit cheese to 1 oz (28 g) per day.  Eat more home-cooked food and less restaurant, buffet, and fast food.  Limit fried foods.  Cook foods using methods other than frying.  Limit canned vegetables. If you do use them, rinse them well to decrease the sodium.  When eating at a restaurant, ask that your food be prepared with less salt, or no salt if possible. WHAT FOODS CAN I EAT? Seek help from a dietitian for individual calorie  needs. Grains Whole grain or whole wheat bread. Brown rice. Whole grain or whole wheat pasta. Quinoa, bulgur, and whole grain cereals. Low-sodium cereals. Corn or whole wheat flour tortillas. Whole grain cornbread. Whole grain crackers. Low-sodium crackers. Vegetables Fresh or frozen vegetables (raw, steamed, roasted, or grilled). Low-sodium or reduced-sodium tomato and vegetable juices. Low-sodium or reduced-sodium tomato sauce and paste. Low-sodium or reduced-sodium canned vegetables.  Fruits All fresh, canned (in natural juice), or frozen fruits. Meat and Other Protein Products Ground beef (85% or leaner), grass-fed beef, or beef trimmed of fat. Skinless chicken or [place]. Ground chicken or [place]. Pork trimmed of fat. All fish and seafood. Eggs. Dried beans, peas, or lentils. Unsalted nuts and seeds. Unsalted canned beans. Dairy Low-fat dairy products, such as skim or 1% milk, 2% or reduced-fat cheeses, low-fat ricotta or cottage cheese, or plain low-fat yogurt. Low-sodium or reduced-sodium cheeses. Fats and Oils Tub margarines without trans fats. Light or reduced-fat mayonnaise and salad dressings (reduced sodium). Avocado. Safflower, olive, or canola oils. Natural peanut or almond butter. Other Unsalted popcorn and pretzels. The items listed above may not be a complete list of recommended foods or beverages. Contact your dietitian for more options. WHAT FOODS ARE NOT RECOMMENDED? Grains White bread. White pasta. White rice. Refined cornbread. Bagels and croissants. Crackers that contain trans fat. Vegetables Creamed or fried vegetables. Vegetables in a cheese sauce. Regular canned vegetables. Regular canned tomato sauce and paste. Regular tomato and vegetable juices. Fruits Dried fruits. Canned fruit  in light or heavy syrup. Fruit juice. Meat and Other Protein Products Fatty cuts of meat. Ribs, chicken wings, bacon, sausage, bologna, salami, chitterlings, fatback, hot dogs, bratwurst,  and packaged luncheon meats. Salted nuts and seeds. Canned beans with salt. Dairy Whole or 2% milk, cream, half-and-half, and cream cheese. Whole-fat or sweetened yogurt. Full-fat cheeses or blue cheese. Nondairy creamers and whipped toppings. Processed cheese, cheese spreads, or cheese curds. Condiments Onion and garlic salt, seasoned salt, table salt, and sea salt. Canned and packaged gravies. Worcestershire sauce. Tartar sauce. Barbecue sauce. Teriyaki sauce. Soy sauce, including reduced sodium. Steak sauce. Fish sauce. Oyster sauce. Cocktail sauce. Horseradish. Ketchup and mustard. Meat flavorings and tenderizers. Bouillon cubes. Hot sauce. Tabasco sauce. Marinades. Taco seasonings. Relishes. Fats and Oils Butter, stick margarine, lard, shortening, ghee, and bacon fat. Coconut, palm kernel, or palm oils. Regular salad dressings. Other Pickles and olives. Salted popcorn and pretzels. The items listed above may not be a complete list of foods and beverages to avoid. Contact your dietitian for more information. WHERE CAN I FIND MORE INFORMATION? National Heart, Lung, and Blood Institute: travelstabloid.com   This information is not intended to replace advice given to you by your health care provider. Make sure you discuss any questions you have with your health care provider.   Document Released: 09/22/2011 Document Revised: 10/24/2014 Document Reviewed: 08/07/2013 Elsevier Interactive Patient Education [employer].

## 2016-06-02 NOTE — Assessment & Plan Note (Signed)
Due for repeat lipids today. Discussed with patient that if above goal then will need to initiate treatment with medication. She has struggled with improvements in diet over the past several months. LFT's WNL.

## 2016-06-02 NOTE — Progress Notes (Signed)
Pre visit review using our clinic review tool, if applicable. No additional management support is needed unless otherwise documented below in the visit note. 

## 2016-06-06 ENCOUNTER — Encounter: Payer: Self-pay | Admitting: *Deleted

## 2016-10-18 ENCOUNTER — Other Ambulatory Visit: Payer: Self-pay | Admitting: Primary Care

## 2016-10-18 DIAGNOSIS — Z1231 Encounter for screening mammogram for malignant neoplasm of breast: Secondary | ICD-10-CM

## 2016-11-10 ENCOUNTER — Ambulatory Visit
Admission: RE | Admit: 2016-11-10 | Discharge: 2016-11-10 | Disposition: A | Discharge status: Home / Self Care | Payer: Managed Care, Other (non HMO) | Source: Ambulatory Visit | Attending: Primary Care | Admitting: Primary Care

## 2016-11-10 DIAGNOSIS — Z1231 Encounter for screening mammogram for malignant neoplasm of breast: Secondary | ICD-10-CM

## 2016-12-05 ENCOUNTER — Encounter: Payer: Self-pay | Admitting: Primary Care

## 2016-12-05 ENCOUNTER — Ambulatory Visit (INDEPENDENT_AMBULATORY_CARE_PROVIDER_SITE_OTHER): Payer: Managed Care, Other (non HMO) | Admitting: Primary Care

## 2016-12-05 ENCOUNTER — Ambulatory Visit: Payer: Managed Care, Other (non HMO) | Admitting: Primary Care

## 2016-12-05 VITALS — BP 130/90 | HR 71 | Temp 98.2°F | Ht 63.0 in | Wt 230.1 lb

## 2016-12-05 DIAGNOSIS — I10 Essential (primary) hypertension: Secondary | ICD-10-CM | POA: Diagnosis not present

## 2016-12-05 DIAGNOSIS — R7303 Prediabetes: Secondary | ICD-10-CM

## 2016-12-05 DIAGNOSIS — E785 Hyperlipidemia, unspecified: Secondary | ICD-10-CM | POA: Diagnosis not present

## 2016-12-05 DIAGNOSIS — E1165 Type 2 diabetes mellitus with hyperglycemia: Secondary | ICD-10-CM | POA: Insufficient documentation

## 2016-12-05 NOTE — Progress Notes (Signed)
Pre visit review using our clinic review tool, if applicable. No additional management support is needed unless otherwise documented below in the visit note. 

## 2016-12-05 NOTE — Assessment & Plan Note (Signed)
Do for recheck today, will return fasting. Check LFT's in case medication will be required.

## 2016-12-05 NOTE — Patient Instructions (Addendum)
Schedule a lab only appointment at your convenience to return fasting for labs.  It's important to improve your diet by reducing consumption of fast food, fried food, processed snack foods, sugary drinks. Increase consumption of fresh vegetables and fruits, whole grains, water.  Ensure you are drinking 64 ounces of water daily.  Start exercising. You should be getting 150 minutes of moderate intensity exercise weekly.  Continue to monitor your blood pressure. Your blood pressure should run less than 140/90.  Follow up in 6 months for your annual physical.  It was a pleasure to see you today!

## 2016-12-05 NOTE — Assessment & Plan Note (Signed)
Diastolic slightly above goal, does run in the 80's at home. Will have her work on diet and exercise. If above goal in 6 months, add in another medication.

## 2016-12-05 NOTE — Progress Notes (Signed)
Subjective:    Patient ID: Amber [name], female    DOB: [birth year], 46 y.o.   MRN: [id number]  HPI  Amber [name] is a 46 year old female who presents today for follow up.  1) Essential Hypertension: Currently managed on Amlodipine 10 mg, valsartan-HCTZ 320-12.5 mg. She's checking her BP at home and is getting readings in the 120's-130's/80's-90's. She denies dizziness, chest pain, shortness of breath.   She is not exercising or eating healthy but is motivated to do so.   2) Hyperlipidemia: Lipids in August above goal, overall improved since February 2017. She's not working on improvements in her diet and she is not exercising. She is due for recheck today but is not fasting. She is not managed on anything for high cholesterol.   3) Prediabetes: A1C of 5.7 in February 2017. She denies numbness/tingling. She's not eating a healthy diet and is not exercising.  Review of Systems  Constitutional: Negative for fatigue.  Eyes: Negative for visual disturbance.  Respiratory: Negative for shortness of breath.   Cardiovascular: Negative for chest pain.  Neurological: Negative for dizziness, numbness and headaches.       Past Medical History:  Diagnosis Date  . HTN (hypertension)   . 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 Marital status: Married    Spouse name: N/A  . Number of children: 2  . Years of education: N/A   Occupational History  .      front desk    Social History Main Topics  . Smoking status: Never Smoker  . Smokeless tobacco: Never Used  . Alcohol use 0.0 oz/week     Comment: rare  . Drug use: No  . Sexual activity: Not on file   Other Topics Concern  . Not on file   Social History Narrative   From [place].   Married.   2 children.   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    Past Surgical History:  Procedure Laterality Date  . CESAREAN SECTION     x2  .  MASS EXCISION Right 01/18/2013   Procedure: EXCISION right scapular cyst;  Surgeon: Axel Filler, MD;  Location: [facility];  Service: General;  Laterality: Right;    Family History  Problem Relation Age of Onset  . Diabetes Father   . Diabetes Mother   . Heart disease Mother   . Diabetes Sister   . Hypertension Sister     No Known Allergies  Current Outpatient Prescriptions on File Prior to Visit  Medication Sig Dispense Refill  . amLODipine (NORVASC) 10 MG tablet Take 1 tablet (10 mg total) by mouth daily. 90 tablet 3  . ferrous sulfate 325 (65 FE) MG tablet Take 325 mg by mouth every morning.     . valsartan-hydrochlorothiazide (DIOVAN-HCT) 320-12.5 MG tablet Take 1 tablet by mouth daily. 90 tablet 3   No current facility-administered medications on file prior to visit.     BP 130/90   Pulse 71   Temp 98.2 F (36.8 C) (Oral)   Ht 5\' 3"  (1.6 m)   Wt 230 lb 1.9 oz (104.4 kg)   LMP 10/31/2016   SpO2 97%   BMI 40.76 kg/m    Objective:   Physical Exam  Constitutional: She appears well-nourished.  Neck: Neck supple.  Cardiovascular: Normal rate and regular rhythm.   Pulmonary/Chest: Effort normal and breath sounds normal.  Skin: Skin is warm and dry.          Assessment & Plan:

## 2016-12-05 NOTE — Assessment & Plan Note (Signed)
A1C of 5.7 on prior labs 1 year ago, will repeat, pending. Discussed the importance of a healthy diet and regular exercise in order for weight loss, and to reduce the risk of other medical diseases.

## 2017-05-17 ENCOUNTER — Ambulatory Visit (INDEPENDENT_AMBULATORY_CARE_PROVIDER_SITE_OTHER): Payer: 59 | Admitting: Primary Care

## 2017-05-17 ENCOUNTER — Ambulatory Visit (INDEPENDENT_AMBULATORY_CARE_PROVIDER_SITE_OTHER)
Admission: RE | Admit: 2017-05-17 | Discharge: 2017-05-17 | Disposition: A | Discharge status: Home / Self Care | Payer: 59 | Source: Ambulatory Visit | Attending: Primary Care | Admitting: Primary Care

## 2017-05-17 VITALS — BP 124/84 | HR 70 | Temp 98.4°F | Ht 63.0 in | Wt 230.1 lb

## 2017-05-17 DIAGNOSIS — M25521 Pain in right elbow: Secondary | ICD-10-CM

## 2017-05-17 NOTE — Progress Notes (Signed)
Subjective:    Patient ID: Amber [name], female    DOB: 05/23/1971, 46 y.o.   MRN: [id number]  HPI  Amber [name] is a 46 year old female who presents today with a chief complaint of upper extremity pain. Her pain is located to the right elbow. Her pain is worse with twisting door knobs or opening jars, writing, squeezing objects. She fell onto her right upper extremity after slipping while getting out of the shower 4 days ago. She noticed swelling immediately after, this improved. She's taken Ibuprofen once daily with temporary improvement. Her pain is no worse, but has not experienced any permanent improvement.   Review of Systems  Musculoskeletal: Positive for arthralgias.  Skin: Negative for color change.  Neurological: Negative for weakness and numbness.       Past Medical History:  Diagnosis Date  . HTN (hypertension)   . 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 Marital status: Married    Spouse name: N/A  . Number of children: 2  . Years of education: N/A   Occupational History  .      front desk    Social History Main Topics  . Smoking status: Never Smoker  . Smokeless tobacco: Never Used  . Alcohol use 0.0 oz/week     Comment: rare  . Drug use: No  . Sexual activity: Not on file   Other Topics Concern  . Not on file   Social History Narrative   From [place].   Married.   2 children.   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    Past Surgical History:  Procedure Laterality Date  . CESAREAN SECTION     x2  . MASS EXCISION Right 01/18/2013   Procedure: EXCISION right scapular cyst;  Surgeon: Amber Filler, MD;  Location: [facility];  Service: General;  Laterality: Right;    Family History  Problem Relation Age of Onset  . Diabetes Father   . Diabetes Mother   . Heart disease Mother   . Diabetes Sister   . Hypertension Sister     No Known Allergies  Current  Outpatient Prescriptions on File Prior to Visit  Medication Sig Dispense Refill  . amLODipine (NORVASC) 10 MG tablet Take 1 tablet (10 mg total) by mouth daily. 90 tablet 3  . ferrous sulfate 325 (65 FE) MG tablet Take 325 mg by mouth every morning.     . valsartan-hydrochlorothiazide (DIOVAN-HCT) 320-12.5 MG tablet Take 1 tablet by mouth daily. 90 tablet 3   No current facility-administered medications on file prior to visit.     BP 124/84   Pulse 70   Temp 98.4 F (36.9 C) (Oral)   Ht 5\' 3"  (1.6 m)   Wt 230 lb 1.9 oz (104.4 kg)   LMP 04/26/2017   SpO2 96%   BMI 40.76 kg/m    Objective:   Physical Exam  Constitutional: She appears well-nourished.  Neck: Neck supple.  Cardiovascular: Normal rate.   Pulses:      Radial pulses are 2+ on the right side.  Pulmonary/Chest: Effort normal.  Musculoskeletal:       Right elbow: She exhibits normal range of motion, no swelling and no deformity. Tenderness found. Olecranon process tenderness noted.  5/5 strength bilaterally to upper extremities with pushing and pulling. Pain with grip squeeze to hands.   Skin: Skin is warm and dry.  Mild nearly healed bruising to right elbow.          Assessment & Plan:  Acute Extremity pain:  Located to olecranon and surrounding area since fall 4 days ago.  Exam today with good and equal strength, no obvious deformity. Check xray today given trauma. Suspect more tendon involvement given pain with movement. Will increase Ibuprofen to 600 mg TID x 5-6 days. Discussed Ice/heat, stretching exercises. She will update if no improvement by Monday next week.  Amber Sheldon, NP

## 2017-05-17 NOTE — Patient Instructions (Signed)
Xray will open again at 3 pm and will close at 6:45 pm. Please return sometime between then for xray. I will notify you of your results once received.   Increase Ibuprofen to 600 mg three times daily for pain and inflammation.  Please call me Monday if no improvement.  It was a pleasure to see you today!

## 2017-05-24 ENCOUNTER — Ambulatory Visit (INDEPENDENT_AMBULATORY_CARE_PROVIDER_SITE_OTHER)
Admission: RE | Admit: 2017-05-24 | Discharge: 2017-05-24 | Disposition: A | Discharge status: Home / Self Care | Payer: 59 | Source: Ambulatory Visit | Attending: Family Medicine | Admitting: Family Medicine

## 2017-05-24 ENCOUNTER — Other Ambulatory Visit: Payer: Self-pay | Admitting: Primary Care

## 2017-05-24 ENCOUNTER — Ambulatory Visit (INDEPENDENT_AMBULATORY_CARE_PROVIDER_SITE_OTHER): Payer: 59 | Admitting: Family Medicine

## 2017-05-24 VITALS — BP 170/100 | HR 85 | Temp 98.8°F | Ht 63.0 in | Wt 231.0 lb

## 2017-05-24 DIAGNOSIS — S42401D Unspecified fracture of lower end of right humerus, subsequent encounter for fracture with routine healing: Secondary | ICD-10-CM

## 2017-05-24 DIAGNOSIS — S52124A Nondisplaced fracture of head of right radius, initial encounter for closed fracture: Secondary | ICD-10-CM | POA: Diagnosis not present

## 2017-05-25 ENCOUNTER — Encounter: Payer: Self-pay | Admitting: Family Medicine

## 2017-05-25 NOTE — Progress Notes (Signed)
Dr. Karleen Hampshire T. Bani Gianfrancesco, MD, CAQ [facility] [address] [place], [address] Phone: [phone] Fax: [phone]  05/24/2017  Patient: Amber [name], MRN: [id number], DOB: [birth year], 46 y.o.  Primary Physician:  Doreene Nest, NP   Cc: f/u fx  Subjective:   Amber [name] is a 46 y.o. very pleasant female patient who presents with the following:  DOI: 05/12/2017  The patient is seen at the request of Mrs. Chestine Spore for an evaluation of a nondisplaced radial head fracture that occurred on the above date.  She was seen and evaluated on May 17, 2017, and at that point was placed in a sling, which she is been using for 7 days.  Initially the patient fell in the shower and used her arm and elbow to break her fall and took a blow directly to the elbow. She continues to have some pain, notably with supination and pronation.  She does work in an office setting.  Past Medical History, Surgical History, Social History, Family History, Problem List, Medications, and Allergies have been reviewed and updated if relevant.  Patient Active Problem List   Diagnosis Date Noted  . Prediabetes 12/05/2016  . Preventative health care 12/04/2015  . Hyperlipidemia 12/04/2015  . Essential hypertension 08/20/2015    Past Medical History:  Diagnosis Date  . HTN (hypertension)   . 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 CESAREAN SECTION     x2  . MASS EXCISION Right 01/18/2013   Procedure: EXCISION right scapular cyst;  Surgeon: Axel Filler, MD;  Location: [facility];  Service: General;  Laterality: Right;    Social History   Social History  . Marital status: Married    Spouse name: N/A  . Number of children: 2  . Years of education: N/A   Occupational History  .      front desk    Social History Main Topics  . Smoking status: Never Smoker  . Smokeless tobacco: Never Used  . Alcohol use  0.0 oz/week     Comment: rare  . Drug use: No  . Sexual activity: Not on file   Other Topics Concern  . Not on file   Social History Narrative   From [place].   Married.   2 children.   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    Family History  Problem Relation Age of Onset  . Diabetes Father   . Diabetes Mother   . Heart disease Mother   . Diabetes Sister   . Hypertension Sister     No Known Allergies  Medication list reviewed and updated in full in  Link.  GEN: No fevers, chills. Nontoxic. Primarily MSK c/o today. MSK: Detailed in the HPI GI: tolerating PO intake without difficulty Neuro: No numbness, parasthesias, or tingling associated. Otherwise the pertinent positives of the ROS are noted above.   Objective:   BP (!) 170/100   Pulse 85   Temp 98.8 F (37.1 C)   Ht 5\' 3"  (1.6 m)   Wt 231 lb (104.8 kg)   LMP 04/26/2017   BMI 40.92 kg/m    GEN: WDWN, NAD, Non-toxic, Alert & Oriented x 3 HEENT: Atraumatic, Normocephalic.  Ears and Nose: No external deformity. EXTR: No clubbing/cyanosis/edema NEURO: Normal gait.  PSYCH: Normally interactive. Conversant. Not depressed or anxious appearing.  Calm demeanor.  Right elbow: Patient does have some limitation and pain with terminal flexion, extension, supination, and pronation.  There is currently no bruising or swelling.  There is no pain with palpation at the proximal radius or ulna, and no pain at the distal humerus.  Neurovascularly intact.  Radiology: Dg Elbow Complete Right  Result Date: 05/24/2017 CLINICAL DATA:  Follow-up right elbow fracture. EXAM: RIGHT ELBOW - COMPLETE 3+ VIEW COMPARISON:  05/17/2017 FINDINGS: The mild cortical buckle along the base of the radial head is without significant change. There is no defined fracture line. No other skeletal abnormality. The elbow joint is normally spaced and aligned with no arthropathic  change. No joint effusion.  Next item soft tissues are unremarkable. IMPRESSION: 1. The appearance of the proximal radius is unchanged. There is a cortical buckle at the base of the radial head without a fracture line. There is no sclerosis or evidence healing of a fracture. There is also no joint effusion. This may be a chronic appearance in this patient, possibly related to a remote fracture. Electronically Signed   By: Amie Portland M.D.   On: 05/24/2017 17:58   Dg Elbow Complete Right  Result Date: 05/17/2017 CLINICAL DATA:  Erect trauma to the elbow during a fall 4 days ago. Persistent elbow pain. EXAM: RIGHT ELBOW - COMPLETE 3+ VIEW COMPARISON:  None impacts FINDINGS: There is subtle lucency involving the radial head in its lateral portion. There is slight angulation of the cortex at the junction of the head with the neck. This suggests a minimally depressed fracture. The olecranon is intact as is the distal humerus. There may be a tiny joint effusion manifested by small posterior fat pad sign. IMPRESSION: Probable minimally depressed fracture of the radial head. Otherwise the examination is within the limits of normal. Electronically Signed   By: David  [place] M.D.   On: 05/17/2017 15:52    Assessment and Plan:   Closed nondisplaced fracture of head of right radius, initial encounter  >25 minutes spent in face to face time with patient, >50% spent in counselling or coordination of care   History, exam, and radiographs are consistent with acute nondisplaced radial head fracture.  Likely this will do well, but she will take 6-8 weeks to heal.  I'm going to have her limit her use of her sling over the next week, and lose it entirely after that.  3-4 times per day, I'm going to have the patient do sets of flexion and extension as well as supination and pronation exercises to decrease risk of elbow stiffness.  Handouts on radial head fracture were given to the patient explaining such.  Follow-up:  2 weeks  Signed,  Sheniece Ruggles T. Davaris Youtsey, MD   Allergies as of 05/24/2017   No Known Allergies     Medication List       Accurate as of 05/24/17 11:59 PM. Always use your most recent med list.          amLODipine 10 MG tablet Commonly known as:  NORVASC Take 1 tablet (10 mg total) by mouth daily.   ferrous sulfate 325 (65 FE) MG tablet Take 325 mg by mouth every morning.   valsartan-hydrochlorothiazide 320-12.5 MG tablet Commonly known as:  DIOVAN-HCT Take 1 tablet by mouth daily.

## 2017-06-10 ENCOUNTER — Other Ambulatory Visit: Payer: Self-pay | Admitting: Primary Care

## 2017-06-10 DIAGNOSIS — I10 Essential (primary) hypertension: Secondary | ICD-10-CM

## 2017-06-26 ENCOUNTER — Encounter: Payer: Self-pay | Admitting: Family Medicine

## 2017-06-26 ENCOUNTER — Ambulatory Visit (INDEPENDENT_AMBULATORY_CARE_PROVIDER_SITE_OTHER)
Admission: RE | Admit: 2017-06-26 | Discharge: 2017-06-26 | Disposition: A | Discharge status: Home / Self Care | Payer: 59 | Source: Ambulatory Visit | Attending: Family Medicine | Admitting: Family Medicine

## 2017-06-26 ENCOUNTER — Ambulatory Visit (INDEPENDENT_AMBULATORY_CARE_PROVIDER_SITE_OTHER): Payer: 59 | Admitting: Family Medicine

## 2017-06-26 VITALS — BP 170/104 | HR 82 | Temp 98.2°F | Ht 63.0 in | Wt 228.5 lb

## 2017-06-26 DIAGNOSIS — S52124D Nondisplaced fracture of head of right radius, subsequent encounter for closed fracture with routine healing: Secondary | ICD-10-CM

## 2017-06-26 NOTE — Progress Notes (Signed)
Dr. Karleen Hampshire T. Marica Trentham, MD, CA[facility] [address] [place], [address] Phone: [phone] Fax: [phone]  06/26/2017  Patient: Amber [name], MRN: [id number], DOB: 12/27/1970, 46 y.o.  Primary Physician:  Doreene Nest, NP   Chief Complaint  Patient presents with  . Follow-up    Arm Pain   Subjective:   Amber [name] is a 46 y.o. very pleasant female patient who presents with the following:  She is here for f/u of nondisplaced radial head fracture - I had asked her to f/u in 2 weeks and she is here 4 weeks after this visit.   Initially, she was placed in a sling for 1 week, and I asked her to work on ROM at last OV.  She is feeling really good, no pain, still lacking some terminal extension, but o/w she is feeling good.  Past Medical History, Surgical History, Social History, Family History, Problem List, Medications, and Allergies have been reviewed and updated if relevant.  Patient Active Problem List   Diagnosis Date Noted  . Prediabetes 12/05/2016  . Preventative health care 12/04/2015  . Hyperlipidemia 12/04/2015  . Essential hypertension 08/20/2015    Past Medical History:  Diagnosis Date  . HTN (hypertension)   . 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 CESAREAN SECTION     x2  . MASS EXCISION Right 01/18/2013   Procedure: EXCISION right scapular cyst;  Surgeon: Axel Filler, MD;  Location: [facility];  Service: General;  Laterality: Right;    Social History   Social History  . Marital status: Married    Spouse name: N/A  . Number of children: 2  . Years of education: N/A   Occupational History  .      front desk    Social History Main Topics  . Smoking status: Never Smoker  . Smokeless tobacco: Never Used  . Alcohol use 0.0 oz/week     Comment: rare  . Drug use: No  . Sexual activity: Not on file   Other Topics Concern  . Not on file    Social History Narrative   From [place].   Married.   2 children.   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    Family History  Problem Relation Age of Onset  . Diabetes Father   . Diabetes Mother   . Heart disease Mother   . Diabetes Sister   . Hypertension Sister     No Known Allergies  Medication list reviewed and updated in full in [facility].  GEN: No fevers, chills. Nontoxic. Primarily MSK c/o today. MSK: Detailed in the HPI GI: tolerating PO intake without difficulty Neuro: No numbness, parasthesias, or tingling associated. Otherwise the pertinent positives of the ROS are noted above.   Objective:   BP (!) 170/104   Pulse 82   Temp 98.2 F (36.8 C) (Oral)   Ht  (1.6 m)   Wt 228 lb 8 oz (103.6 kg)   BMI 40.48 kg/m    GEN: WDWN, NAD, Non-toxic, Alert & Oriented x 3 HEENT: Atraumatic, Normocephalic.  Ears and Nose: No external deformity. EXTR: No clubbing/cyanosis/edema NEURO: Normal gait.  PSYCH: Normally interactive. Conversant. Not depressed or anxious appearing.  Calm demeanor.    R elbow NT to exam. Lacks 4 deg ext, full flexion, full rotational movements.  Radiology: Dg Elbow Complete Right (3+view)  Result Date: 06/26/2017 CLINICAL DATA:  Follow-up nondisplaced radial head fracture on the right. Subsequent encounter EXAM: RIGHT ELBOW - COMPLETE 3+ VIEW COMPARISON:  05/24/2017 FINDINGS: Bandlike sclerosis across the radial neck, at site of previously suspected fracture. No malalignment or joint effusion. No new abnormality. IMPRESSION: Sclerosis across the radial neck fracture consistent with healing. No displacement or joint effusion. Electronically Signed   By: Marnee Spring M.D.   On: 06/26/2017 15:14     Assessment and Plan:   Closed nondisplaced fracture of head of right radius with routine healing, subsequent encounter - Plan: DG ELBOW COMPLETE RIGHT (3+VIEW)  Healing  well, now almost 6 weeks out.  Anticipate few more weeks for full healing and hopefully full extension - lack of few deg is possible.  Avoid contact next 3-4 weeks, but ow cont with ROM  Follow-up: prn only  Orders Placed This Encounter  Procedures  . DG ELBOW COMPLETE RIGHT (3+VIEW)    Signed,  Jami Ohlin T. Yanilen Adamik, MD   Patient's Medications  New Prescriptions   No medications on file  Previous Medications   AMLODIPINE (NORVASC) 10 MG TABLET    TAKE 1 TABLET BY MOUTH EVERY DAY   FERROUS SULFATE 325 (65 FE) MG TABLET    Take 325 mg by mouth every morning.    VALSARTAN-HYDROCHLOROTHIAZIDE (DIOVAN-HCT) 320-12.5 MG TABLET    TAKE 1 TABLET BY MOUTH EVERY DAY  Modified Medications   No medications on file  Discontinued Medications   No medications on file

## 2017-11-13 ENCOUNTER — Other Ambulatory Visit: Payer: Self-pay | Admitting: Primary Care

## 2017-11-13 DIAGNOSIS — Z139 Encounter for screening, unspecified: Secondary | ICD-10-CM

## 2017-12-01 ENCOUNTER — Ambulatory Visit
Admission: RE | Admit: 2017-12-01 | Discharge: 2017-12-01 | Disposition: A | Discharge status: Home / Self Care | Payer: 59 | Source: Ambulatory Visit | Attending: Primary Care | Admitting: Primary Care

## 2017-12-01 DIAGNOSIS — Z139 Encounter for screening, unspecified: Secondary | ICD-10-CM

## 2017-12-01 DIAGNOSIS — Z1231 Encounter for screening mammogram for malignant neoplasm of breast: Secondary | ICD-10-CM | POA: Diagnosis not present

## 2018-05-04 ENCOUNTER — Encounter: Payer: Self-pay | Admitting: *Deleted

## 2018-05-04 ENCOUNTER — Ambulatory Visit: Payer: Commercial Managed Care - PPO | Admitting: Primary Care

## 2018-05-04 ENCOUNTER — Encounter: Payer: Self-pay | Admitting: Primary Care

## 2018-05-04 VITALS — BP 134/84 | HR 67 | Temp 98.2°F | Ht 63.0 in | Wt 223.0 lb

## 2018-05-04 DIAGNOSIS — I10 Essential (primary) hypertension: Secondary | ICD-10-CM | POA: Diagnosis not present

## 2018-05-04 DIAGNOSIS — E785 Hyperlipidemia, unspecified: Secondary | ICD-10-CM

## 2018-05-04 DIAGNOSIS — R7303 Prediabetes: Secondary | ICD-10-CM

## 2018-05-04 LAB — LIPID PANEL
CHOL/HDL RATIO: 3
Cholesterol: 133 mg/dL (ref 0–200)
HDL: 38.6 mg/dL — AB (ref >39.00)
LDL Cholesterol: 70 mg/dL (ref 0–99)
NonHDL: 94.75
TRIGLYCERIDES: 125 mg/dL (ref 0.0–149.0)
VLDL: 25 mg/dL (ref 0.0–40.0)

## 2018-05-04 LAB — COMPREHENSIVE METABOLIC PANEL
ALT: 21 U/L (ref 0–35)
AST: 16 U/L (ref 0–37)
Albumin: 3.8 g/dL (ref 3.5–5.2)
Alkaline Phosphatase: 75 U/L (ref 39–117)
BILIRUBIN TOTAL: 0.4 mg/dL (ref 0.2–1.2)
BUN: 15 mg/dL (ref 6–23)
CALCIUM: 9.4 mg/dL (ref 8.4–10.5)
CO2: 29 meq/L (ref 19–32)
Chloride: 104 mEq/L (ref 96–112)
Creatinine, Ser: 0.46 mg/dL (ref 0.40–1.20)
GFR: 154.18 mL/min (ref >60.00)
Glucose, Bld: 128 mg/dL — ABNORMAL HIGH (ref 70–99)
POTASSIUM: 3.6 meq/L (ref 3.5–5.1)
Sodium: 141 mEq/L (ref 135–145)
Total Protein: 7.1 g/dL (ref 6.0–8.3)

## 2018-05-04 LAB — HEMOGLOBIN A1C: Hgb A1c MFr Bld: 5.8 % (ref 4.6–6.5)

## 2018-05-04 NOTE — Assessment & Plan Note (Signed)
Weight loss since last visit. Recommended regular exercise and a healthy diet. Repeat lipids pending.

## 2018-05-04 NOTE — Patient Instructions (Signed)
Stop by the lab prior to leaving today. I will notify you of your results once received.   Make sure to eat vegetables, fruit, lean protein.  Ensure you are consuming 64 ounces of water daily.  Start exercising. You should be getting 150 minutes of moderate intensity exercise weekly.  Please notify your pharmacy when you need refills of your medications.  It was a pleasure to see you today!

## 2018-05-04 NOTE — Progress Notes (Signed)
Subjective:    Patient ID: Amber [name], female    DOB: 02/16/1971, 47 y.o.   MRN: [id number]  HPI  [name] is a 47 year old female who presents today for follow up.  1) Essential Hypertension: Currently managed on Amlodipine 10 mg and valsartan-HCTZ 320-12.5 mg tablets. She denies chest pain, shortness of breath, dizziness.   BP Readings from Last 3 Encounters:  05/04/18 134/84  06/26/17 (!) 170/104  05/25/17 (!) 170/100   2) Prediabetes: Last A1C of 5.7 in 2017.   Wt Readings from Last 3 Encounters:  05/04/18 223 lb (101.2 kg)  06/26/17 228 lb 8 oz (103.6 kg)  05/25/17 231 lb (104.8 kg)     3) Hyperlipidemia: Last lipid panel with TC of 213, LDL of 134 in 2017.  Diet currently consists of:  Breakfast: Skips Lunch: Skips mostly, salad Dinner: Meat, starch (chicken, rice) Snacks: None Desserts: Daily  Beverages: Coffee, juice, sweet tea  Exercise: She is not exercising   Review of Systems  Eyes: Negative for visual disturbance.  Respiratory: Negative for shortness of breath.   Cardiovascular: Negative for chest pain.  Neurological: Negative for dizziness, numbness and headaches.       Past Medical History:  Diagnosis Date  . HTN (hypertension)   . 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 Marital status: Married    Spouse name: Not on file  . Number of children: 2  . Years of education: Not on file  . Highest education level: Not on file  Occupational History    Comment: front desk   Social Needs  . Financial resource strain: Not on file  . Food insecurity:    Worry: Not on file    Inability: Not on file  . Transportation needs:    Medical: Not on file    Non-medical: Not on file  Tobacco Use  . Smoking status: Never Smoker  . Smokeless tobacco: Never Used  Substance and Sexual Activity  . Alcohol use: Yes    Alcohol/week: 0.0 oz    Comment: rare  . Drug use: No  . Sexual activity: Not on file    Lifestyle  . Physical activity:    Days per week: Not on file    Minutes per session: Not on file  . Stress: Not on file  Relationships  . Social connections:    Talks on phone: Not on file    Gets together: Not on file    Attends religious service: Not on file    Active member of club or organization: Not on file    Attends meetings of clubs or organizations: Not on file    Relationship status: Not on file  . Intimate partner violence:    Fear of current or ex partner: Not on file    Emotionally abused: Not on file    Physically abused: Not on file    Forced sexual activity: Not on file  Other Topics Concern  . Not on file  Social History Narrative   From [place].   Married.   2 children.   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    Past Surgical History:  Procedure Laterality Date  . CESAREAN SECTION     x2  . MASS EXCISION Right 01/18/2013   Procedure: EXCISION right scapular cyst;  Surgeon: Axel Filler, MD;  Location: [facility];  Service: General;  Laterality: Right;    Family History  Problem Relation Age of Onset  . Diabetes Father   . Diabetes Mother   . Heart disease Mother   . Diabetes Sister   . Hypertension Sister     No Known Allergies  Current Outpatient Medications on File Prior to Visit  Medication Sig Dispense Refill  . amLODipine (NORVASC) 10 MG tablet TAKE 1 TABLET BY MOUTH EVERY DAY 90 tablet 1  . ferrous sulfate 325 (65 FE) MG tablet Take 325 mg by mouth every morning.     . valsartan-hydrochlorothiazide (DIOVAN-HCT) 320-12.5 MG tablet TAKE 1 TABLET BY MOUTH EVERY DAY 90 tablet 1   No current facility-administered medications on file prior to visit.     BP 134/84   Pulse 67   Temp 98.2 F (36.8 C) (Oral)   Ht 5\' 3"  (1.6 m)   Wt 223 lb (101.2 kg)   LMP 06/19/2017 (Approximate)   SpO2 97%   BMI 39.50 kg/m    Objective:   Physical Exam  Constitutional: She appears  well-nourished.  Neck: Neck supple.  Cardiovascular: Normal rate and regular rhythm.  Respiratory: Effort normal and breath sounds normal.  Skin: Skin is warm and dry.  Psychiatric: She has a normal mood and affect.           Assessment & Plan:

## 2018-05-04 NOTE — Assessment & Plan Note (Signed)
Repeat A1C pending today ?

## 2018-05-04 NOTE — Assessment & Plan Note (Signed)
Borderline high, discussed to work on diet and start exercising. Continue current regimen. BMP pending.

## 2018-11-09 ENCOUNTER — Other Ambulatory Visit: Payer: Self-pay | Admitting: Primary Care

## 2018-11-09 DIAGNOSIS — Z1231 Encounter for screening mammogram for malignant neoplasm of breast: Secondary | ICD-10-CM

## 2018-11-16 ENCOUNTER — Other Ambulatory Visit: Payer: Self-pay | Admitting: Primary Care

## 2018-11-16 DIAGNOSIS — I10 Essential (primary) hypertension: Secondary | ICD-10-CM

## 2018-11-16 DIAGNOSIS — R7303 Prediabetes: Secondary | ICD-10-CM

## 2018-11-16 DIAGNOSIS — E785 Hyperlipidemia, unspecified: Secondary | ICD-10-CM

## 2018-11-21 ENCOUNTER — Other Ambulatory Visit (INDEPENDENT_AMBULATORY_CARE_PROVIDER_SITE_OTHER): Payer: Commercial Managed Care - PPO

## 2018-11-21 DIAGNOSIS — I10 Essential (primary) hypertension: Secondary | ICD-10-CM

## 2018-11-21 DIAGNOSIS — E785 Hyperlipidemia, unspecified: Secondary | ICD-10-CM | POA: Diagnosis not present

## 2018-11-21 DIAGNOSIS — R7303 Prediabetes: Secondary | ICD-10-CM

## 2018-11-21 LAB — CBC
HCT: 36.6 % (ref 36.0–46.0)
Hemoglobin: 12.6 g/dL (ref 12.0–15.0)
MCHC: 34.4 g/dL (ref 30.0–36.0)
MCV: 72.2 fl — ABNORMAL LOW (ref 78.0–100.0)
PLATELETS: 290 10*3/uL (ref 150.0–400.0)
RBC: 5.08 Mil/uL (ref 3.87–5.11)
RDW: 15.2 % (ref 11.5–15.5)
WBC: 10.7 10*3/uL — AB (ref 4.0–10.5)

## 2018-11-21 LAB — LIPID PANEL
Cholesterol: 132 mg/dL (ref 0–200)
HDL: 32.1 mg/dL — ABNORMAL LOW (ref >39.00)
LDL Cholesterol: 82 mg/dL (ref 0–99)
NonHDL: 100.38
Total CHOL/HDL Ratio: 4
Triglycerides: 91 mg/dL (ref 0.0–149.0)
VLDL: 18.2 mg/dL (ref 0.0–40.0)

## 2018-11-21 LAB — COMPREHENSIVE METABOLIC PANEL
ALT: 20 U/L (ref 0–35)
AST: 20 U/L (ref 0–37)
Albumin: 4 g/dL (ref 3.5–5.2)
Alkaline Phosphatase: 97 U/L (ref 39–117)
BUN: 14 mg/dL (ref 6–23)
CO2: 25 mEq/L (ref 19–32)
Calcium: 9.9 mg/dL (ref 8.4–10.5)
Chloride: 106 mEq/L (ref 96–112)
Creatinine, Ser: 0.4 mg/dL (ref 0.40–1.20)
GFR: 170.05 mL/min (ref >60.00)
Glucose, Bld: 113 mg/dL — ABNORMAL HIGH (ref 70–99)
Potassium: 3.6 mEq/L (ref 3.5–5.1)
Sodium: 141 mEq/L (ref 135–145)
Total Bilirubin: 0.6 mg/dL (ref 0.2–1.2)
Total Protein: 7.2 g/dL (ref 6.0–8.3)

## 2018-11-21 LAB — HEMOGLOBIN A1C: Hgb A1c MFr Bld: 5.5 % (ref 4.6–6.5)

## 2018-11-28 ENCOUNTER — Ambulatory Visit (INDEPENDENT_AMBULATORY_CARE_PROVIDER_SITE_OTHER): Payer: Commercial Managed Care - PPO | Admitting: Primary Care

## 2018-11-28 ENCOUNTER — Encounter: Payer: Self-pay | Admitting: Primary Care

## 2018-11-28 VITALS — BP 134/80 | HR 92 | Temp 98.2°F | Ht 63.0 in | Wt 210.5 lb

## 2018-11-28 DIAGNOSIS — R7303 Prediabetes: Secondary | ICD-10-CM | POA: Diagnosis not present

## 2018-11-28 DIAGNOSIS — M25511 Pain in right shoulder: Secondary | ICD-10-CM

## 2018-11-28 DIAGNOSIS — Z23 Encounter for immunization: Secondary | ICD-10-CM | POA: Diagnosis not present

## 2018-11-28 DIAGNOSIS — I10 Essential (primary) hypertension: Secondary | ICD-10-CM

## 2018-11-28 DIAGNOSIS — Z0001 Encounter for general adult medical examination with abnormal findings: Secondary | ICD-10-CM

## 2018-11-28 DIAGNOSIS — E785 Hyperlipidemia, unspecified: Secondary | ICD-10-CM

## 2018-11-28 DIAGNOSIS — G8929 Other chronic pain: Secondary | ICD-10-CM | POA: Insufficient documentation

## 2018-11-28 MED ORDER — NAPROXEN 500 MG PO TABS: 500.0000 mg | ORAL_TABLET | Freq: Two times a day (BID) | ORAL | 0 refills | Status: DC

## 2018-11-28 MED ORDER — AMLODIPINE BESYLATE 10 MG PO TABS: 10.0000 mg | ORAL_TABLET | Freq: Every day | ORAL | 3 refills | Status: DC

## 2018-11-28 MED ORDER — VALSARTAN-HYDROCHLOROTHIAZIDE 320-25 MG PO TABS: 1.0000 | ORAL_TABLET | Freq: Every day | ORAL | 3 refills | Status: DC

## 2018-11-28 NOTE — Patient Instructions (Signed)
Start exercising. You should be getting 150 minutes of moderate intensity exercise weekly.  It's important to improve your diet by reducing consumption of fast food, fried food, processed snack foods, sugary drinks. Increase consumption of fresh vegetables and fruits, whole grains, water.  Ensure you are drinking 64 ounces of water daily.  Start naproxen 500 mg tablets twice daily for 7 days for shoulder pain and inflammation. Try the exercises below.  Follow up with Dr.Copland if your symptoms persist to the shoulder.  Complete your mammogram as scheduled.  Schedule a visit for your pap smear as discussed.  It was a pleasure to see you today!   Shoulder Exercises Ask your health care provider which exercises are safe for you. Do exercises exactly as told by your health care provider and adjust them as directed. It is normal to feel mild stretching, pulling, tightness, or discomfort as you do these exercises, but you should stop right away if you feel sudden pain or your pain gets worse.Do not begin these exercises until told by your health care provider. Range of Motion Exercises        These exercises warm up your muscles and joints and improve the movement and flexibility of your shoulder. These exercises also help to relieve pain, numbness, and tingling. These exercises involve stretching your injured shoulder directly. Exercise A: Pendulum 1. Stand near a wall or a surface that you can hold onto for balance. 2. Bend at the waist and let your left / right arm hang straight down. Use your other arm to support you. Keep your back straight and do not lock your knees. 3. Relax your left / right arm and shoulder muscles, and move your hips and your trunk so your left / right arm swings freely. Your arm should swing because of the motion of your body, not because you are using your arm or shoulder muscles. 4. Keep moving your body so your arm swings in the following directions, as told  by your health care provider: ? Side to side. ? Forward and backward. ? In clockwise and counterclockwise circles. 5. Continue each motion for __________ seconds, or for as long as told by your health care provider. 6. Slowly return to the starting position. Repeat __________ times. Complete this exercise __________ times a day. Exercise B:Flexion, Standing 1. Stand and hold a broomstick, a cane, or a similar object. Place your hands a little more than shoulder-width apart on the object. Your left / right hand should be palm-up, and your other hand should be palm-down. 2. Keep your elbow straight and keep your shoulder muscles relaxed. Push the stick down with your healthy arm to raise your left / right arm in front of your body, and then over your head until you feel a stretch in your shoulder. ? Avoid shrugging your shoulder while you raise your arm. Keep your shoulder blade tucked down toward the middle of your back. 3. Hold for __________ seconds. 4. Slowly return to the starting position. Repeat __________ times. Complete this exercise __________ times a day. Exercise C: Abduction, Standing 1. Stand and hold a broomstick, a cane, or a similar object. Place your hands a little more than shoulder-width apart on the object. Your left / right hand should be palm-up, and your other hand should be palm-down. 2. While keeping your elbow straight and your shoulder muscles relaxed, push the stick across your body toward your left / right side. Raise your left / right arm to the side  of your body and then over your head until you feel a stretch in your shoulder. ? Do not raise your arm above shoulder height, unless your health care provider tells you to do that. ? Avoid shrugging your shoulder while you raise your arm. Keep your shoulder blade tucked down toward the middle of your back. 3. Hold for __________ seconds. 4. Slowly return to the starting position. Repeat __________ times. Complete this  exercise __________ times a day. Exercise D:Internal Rotation 1. Place your left / right hand behind your back, palm-up. 2. Use your other hand to dangle an exercise band, a towel, or a similar object over your shoulder. Grasp the band with your left / right hand so you are holding onto both ends. 3. Gently pull up on the band until you feel a stretch in the front of your left / right shoulder. ? Avoid shrugging your shoulder while you raise your arm. Keep your shoulder blade tucked down toward the middle of your back. 4. Hold for __________ seconds. 5. Release the stretch by letting go of the band and lowering your hands. Repeat __________ times. Complete this exercise __________ times a day. Stretching Exercises  These exercises warm up your muscles and joints and improve the movement and flexibility of your shoulder. These exercises also help to relieve pain, numbness, and tingling. These exercises are done using your healthy shoulder to help stretch the muscles of your injured shoulder. Exercise E: Research officer, political partyCorner Stretch (External Rotation and Abduction) 1. Stand in a doorway with one of your feet slightly in front of the other. This is called a staggered stance. If you cannot reach your forearms to the door frame, stand facing a corner of a room. 2. Choose one of the following positions as told by your health care provider: ? Place your hands and forearms on the door frame above your head. ? Place your hands and forearms on the door frame at the height of your head. ? Place your hands on the door frame at the height of your elbows. 3. Slowly move your weight onto your front foot until you feel a stretch across your chest and in the front of your shoulders. Keep your head and chest upright and keep your abdominal muscles tight. 4. Hold for __________ seconds. 5. To release the stretch, shift your weight to your back foot. Repeat __________ times. Complete this stretch __________ times a  day. Exercise F:Extension, Standing 1. Stand and hold a broomstick, a cane, or a similar object behind your back. ? Your hands should be a little wider than shoulder-width apart. ? Your palms should face away from your back. 2. Keeping your elbows straight and keeping your shoulder muscles relaxed, move the stick away from your body until you feel a stretch in your shoulder. ? Avoid shrugging your shoulders while you move the stick. Keep your shoulder blade tucked down toward the middle of your back. 3. Hold for __________ seconds. 4. Slowly return to the starting position. Repeat __________ times. Complete this exercise __________ times a day. Strengthening Exercises           These exercises build strength and endurance in your shoulder. Endurance is the ability to use your muscles for a long time, even after they get tired. Exercise G:External Rotation 1. Sit in a stable chair without armrests. 2. Secure an exercise band at elbow height on your left / right side. 3. Place a soft object, such as a folded towel or a small  pillow, between your left / right upper arm and your body to move your elbow a few inches away (about 10 cm) from your side. 4. Hold the end of the band so it is tight and there is no slack. 5. Keeping your elbow pressed against the soft object, move your left / right forearm out, away from your abdomen. Keep your body steady so only your forearm moves. 6. Hold for __________ seconds. 7. Slowly return to the starting position. Repeat __________ times. Complete this exercise __________ times a day. Exercise H:Shoulder Abduction 1. Sit in a stable chair without armrests, or stand. 2. Hold a __________ weight in your left / right hand, or hold an exercise band with both hands. 3. Start with your arms straight down and your left / right palm facing in, toward your body. 4. Slowly lift your left / right hand out to your side. Do not lift your hand above shoulder  height unless your health care provider tells you that this is safe. ? Keep your arms straight. ? Avoid shrugging your shoulder while you do this movement. Keep your shoulder blade tucked down toward the middle of your back. 5. Hold for __________ seconds. 6. Slowly lower your arm, and return to the starting position. Repeat __________ times. Complete this exercise __________ times a day. Exercise I:Shoulder Extension 1. Sit in a stable chair without armrests, or stand. 2. Secure an exercise band to a stable object in front of you where it is at shoulder height. 3. Hold one end of the exercise band in each hand. Your palms should face each other. 4. Straighten your elbows and lift your hands up to shoulder height. 5. Step back, away from the secured end of the exercise band, until the band is tight and there is no slack. 6. Squeeze your shoulder blades together as you pull your hands down to the sides of your thighs. Stop when your hands are straight down by your sides. Do not let your hands go behind your body. 7. Hold for __________ seconds. 8. Slowly return to the starting position. Repeat __________ times. Complete this exercise __________ times a day. Exercise J:Standing Shoulder Row 1. Sit in a stable chair without armrests, or stand. 2. Secure an exercise band to a stable object in front of you so it is at waist height. 3. Hold one end of the exercise band in each hand. Your palms should be in a thumbs-up position. 4. Bend each of your elbows to an "L" shape (about 90 degrees) and keep your upper arms at your sides. 5. Step back until the band is tight and there is no slack. 6. Slowly pull your elbows back behind you. 7. Hold for __________ seconds. 8. Slowly return to the starting position. Repeat __________ times. Complete this exercise __________ times a day. Exercise K:Shoulder Press-Ups 1. Sit in a stable chair that has armrests. Sit upright, with your feet flat on the  floor. 2. Put your hands on the armrests so your elbows are bent and your fingers are pointing forward. Your hands should be about even with the sides of your body. 3. Push down on the armrests and use your arms to lift yourself off of the chair. Straighten your elbows and lift yourself up as much as you comfortably can. ? Move your shoulder blades down, and avoid letting your shoulders move up toward your ears. ? Keep your feet on the ground. As you get stronger, your feet should support less of  your body weight as you lift yourself up. 4. Hold for __________ seconds. 5. Slowly lower yourself back into the chair. Repeat __________ times. Complete this exercise __________ times a day. Exercise L: Wall Push-Ups 1. Stand so you are facing a stable wall. Your feet should be about one arm-length away from the wall. 2. Lean forward and place your palms on the wall at shoulder height. 3. Keep your feet flat on the floor as you bend your elbows and lean forward toward the wall. 4. Hold for __________ seconds. 5. Straighten your elbows to push yourself back to the starting position. Repeat __________ times. Complete this exercise __________ times a day. This information is not intended to replace advice given to you by your health care provider. Make sure you discuss any questions you have with your health care provider. Document Released: 08/17/2005 Document Revised: 02/06/2018 Document Reviewed: 06/14/2015 Elsevier Interactive Patient Education  2019 [employer].

## 2018-11-28 NOTE — Assessment & Plan Note (Signed)
With decrease in ROM. Overall mild but persistent. She has not tried anything OTC or stretching for treatment. Start with Rx for naproxen 500 mg BID x 7-10 days. Stretching exercises provided. Discussed to follow up with sports medicine if symptoms persist.

## 2018-11-28 NOTE — Assessment & Plan Note (Signed)
Tetanus UTD, influenza vaccination provided today. Pap smear overdue, declines today, she will return at later date to have this done. Mammogram scheduled. Discussed the importance of a healthy diet and regular exercise in order for weight loss, and to reduce the risk of any potential medical problems. Exam stable. Labs reviewed. Follow up in 1 year for CPE.

## 2018-11-28 NOTE — Addendum Note (Signed)
Addended by: Tawnya Crook on: 11/28/2018 12:21 PM   Modules accepted: Orders

## 2018-11-28 NOTE — Assessment & Plan Note (Signed)
A1C of 5.5 today, commended her on weight loss efforts. Continue to monitor A1C annually.

## 2018-11-28 NOTE — Assessment & Plan Note (Signed)
Borderline today, also on prior visits. Increase valsartan-HCTZ to 320-25 mg. Continue amlodipine 10 mg daily. BMP unremarkable. She will monitor her BP and report readings at or above 130/90.

## 2018-11-28 NOTE — Assessment & Plan Note (Signed)
At goal on recent labs, continue to monitor.

## 2018-11-28 NOTE — Progress Notes (Signed)
Subjective:    Patient ID: Amber [name], female    DOB: 20-Nov-1970, 48 y.o.   MRN: [id number]  HPI  Amber [name] is a 48 year old female who presents today for complete physical.  Immunizations: -Tetanus: Completed in 2016 -Influenza: Due today   Diet: She endorses a fair diet Breakfast: Skips, works second shift Lunch: Rice, meat Dinner: Rice, meat  Snacks: None Desserts: Twice weekly  Beverages: Soda, water, occasional juice and sweet tea  Exercise: She is not exercising Eye exam: Completed years ago  Dental exam: No recent exam Pap Smear: Overdue, she declines today and wants to return at a later date Mammogram: Scheduled for February 25th, 2020  BP Readings from Last 3 Encounters:  11/28/18 134/80  05/04/18 134/84  06/26/17 (!) 170/104   Wt Readings from Last 3 Encounters:  11/28/18 210 lb 8 oz (95.5 kg)  05/04/18 223 lb (101.2 kg)  06/26/17 228 lb 8 oz (103.6 kg)      Review of Systems  Constitutional: Negative for unexpected weight change.  HENT: Negative for rhinorrhea.   Respiratory: Negative for cough and shortness of breath.   Cardiovascular: Negative for chest pain.  Gastrointestinal: Negative for constipation and diarrhea.  Genitourinary: Negative for difficulty urinating.  Musculoskeletal: Positive for arthralgias.       Chronic intermittent right shoulder pain with stiffness x several months, no worse. No resting pain.  Skin: Negative for rash.  Allergic/Immunologic: Negative for environmental allergies.  Neurological: Negative for dizziness, numbness and headaches.  Psychiatric/Behavioral: The patient is not nervous/anxious.        Past Medical History:  Diagnosis Date  . HTN (hypertension)   . 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 Marital status: Married    Spouse name: Not on file  . Number of children: 2  . Years of education: Not on file  . Highest education level: Not on file    Occupational History    Comment: front desk   Social Needs  . Financial resource strain: Not on file  . Food insecurity:    Worry: Not on file    Inability: Not on file  . Transportation needs:    Medical: Not on file    Non-medical: Not on file  Tobacco Use  . Smoking status: Never Smoker  . Smokeless tobacco: Never Used  Substance and Sexual Activity  . Alcohol use: Yes    Alcohol/week: 0.0 standard drinks    Comment: rare  . Drug use: No  . Sexual activity: Not on file  Lifestyle  . Physical activity:    Days per week: Not on file    Minutes per session: Not on file  . Stress: Not on file  Relationships  . Social connections:    Talks on phone: Not on file    Gets together: Not on file    Attends religious service: Not on file    Active member of club or organization: Not on file    Attends meetings of clubs or organizations: Not on file    Relationship status: Not on file  . Intimate partner violence:    Fear of current or ex partner: Not on file    Emotionally abused: Not on file    Physically abused: Not on file    Forced sexual activity: Not on file  Other Topics Concern  . Not on file  Social History Narrative   From [place].  Married.   2 children.   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    Past Surgical History:  Procedure Laterality Date  . CESAREAN SECTION     x2  . MASS EXCISION Right 01/18/2013   Procedure: EXCISION right scapular cyst;  Surgeon: Axel Filler, MD;  Location: [facility];  Service: General;  Laterality: Right;    Family History  Problem Relation Age of Onset  . Diabetes Father   . Diabetes Mother   . Heart disease Mother   . Diabetes Sister   . Hypertension Sister     No Known Allergies  Current Outpatient Medications on File Prior to Visit  Medication Sig Dispense Refill  . ferrous sulfate 325 (65 FE) MG tablet Take 325 mg by mouth every morning.      No  current facility-administered medications on file prior to visit.     BP 134/80   Pulse 92   Temp 98.2 F (36.8 C) (Oral)   Ht 5\' 3"  (1.6 m)   Wt 210 lb 8 oz (95.5 kg)   LMP 06/19/2017 (Approximate)   SpO2 98%   BMI 37.29 kg/m    Objective:   Physical Exam  Constitutional: She is oriented to person, place, and time. She appears well-nourished.  HENT:  Mouth/Throat: No oropharyngeal exudate.  Eyes: Pupils are equal, round, and reactive to light. EOM are normal.  Neck: Neck supple. No thyromegaly present.  Cardiovascular: Normal rate and regular rhythm.  Respiratory: Effort normal and breath sounds normal.  GI: Soft. Bowel sounds are normal. There is no abdominal tenderness.  Musculoskeletal:     Right shoulder: She exhibits decreased range of motion and pain. She exhibits no tenderness, no bony tenderness and no deformity.     Comments: Mild decrease in ROM with pain to forward abduction   Neurological: She is alert and oriented to person, place, and time.  Skin: Skin is warm and dry.  Psychiatric: She has a normal mood and affect.           Assessment & Plan:

## 2018-12-07 ENCOUNTER — Ambulatory Visit: Payer: Commercial Managed Care - PPO

## 2018-12-11 ENCOUNTER — Ambulatory Visit: Payer: Commercial Managed Care - PPO

## 2019-01-01 ENCOUNTER — Ambulatory Visit
Admission: RE | Admit: 2019-01-01 | Discharge: 2019-01-01 | Disposition: A | Discharge status: Home / Self Care | Payer: Commercial Managed Care - PPO | Source: Ambulatory Visit | Attending: Primary Care | Admitting: Primary Care

## 2019-01-01 ENCOUNTER — Other Ambulatory Visit: Payer: Self-pay

## 2019-01-01 DIAGNOSIS — Z1231 Encounter for screening mammogram for malignant neoplasm of breast: Secondary | ICD-10-CM | POA: Diagnosis not present

## 2019-03-27 ENCOUNTER — Ambulatory Visit (INDEPENDENT_AMBULATORY_CARE_PROVIDER_SITE_OTHER): Payer: Commercial Managed Care - PPO | Admitting: Primary Care

## 2019-03-27 ENCOUNTER — Encounter: Payer: Self-pay | Admitting: Primary Care

## 2019-03-27 VITALS — Wt 175.0 lb

## 2019-03-27 DIAGNOSIS — R634 Abnormal weight loss: Secondary | ICD-10-CM

## 2019-03-27 HISTORY — DX: Abnormal weight loss: R63.4

## 2019-03-27 NOTE — Assessment & Plan Note (Addendum)
Steady weight loss since July 2019, more so since February 2020 without any efforts to lose weight.  ROS was negative for alarm signs. Mammogram from March 2020 normal. No family history of colon cancer. She is a non smoker. No recent pap smear on file as she declined during her last visit.  Check labs today including A1C, TSH, CBC, CMP, HIV. Await results.

## 2019-03-27 NOTE — Patient Instructions (Signed)
Call the main line to schedule a lab appointment as discussed.  Continue to monitor your blood pressure and notify me if you still see the number 90 on the bottom.   It was a pleasure to see you today! [name], NP-C

## 2019-03-27 NOTE — Progress Notes (Signed)
Subjective:    Patient ID: Amber [name], female    DOB: Mar 11, 1971, 48 y.o.   MRN: [id number]  HPI  Virtual Visit via Video Note  I connected with Amber [name] on 03/27/19 at  9:20 AM EDT by a video enabled telemedicine application and verified that I am speaking with the correct person using two identifiers.  Location: Patient: Home Provider: Office   I discussed the limitations of evaluation and management by telemedicine and the availability of in person appointments. The patient expressed understanding and agreed to proceed.  History of Present Illness:  Amber [name] is a 48 year old female with a history of prediabetes, hypertension, hyperlipidemia who presents today for follow up. She also reports unexplained weight loss over the last four months.   1) Essential Hypertension: Currently managed on Amlodipine 10 mg and valsartan-HCTZ 320-25 mg. She's been checking her blood pressure at home which is running 110's/90's. She is compliant to her medications.   2) Hyperlipidemia: Not currently on medication. Last lipid panel on file from February 2020 with LDL of 82.  3) Prediabetes: A1C of 5.5 in February 2020. She presents today endorsing a weight loss of 30 pounds without diet and exercise or any intention to lose weight.   She has had some night sweats. She was having some vomiting several  weeks ago after taking some vitamins that didn't set well on her stomach, no recent vomiting. She denies bloody stools, diarrhea, constipation, vaginal bleeding.   Wt Readings from Last 3 Encounters:  03/27/19 175 lb (79.4 kg)  11/28/18 210 lb 8 oz (95.5 kg)  05/04/18 223 lb (101.2 kg)      Observations/Objective:  Alert and oriented. Appears well, not sickly. No distress. Speaking in complete sentences.   Assessment and Plan:  See problem based charting.  Follow Up Instructions:  Call the main line to schedule a lab appointment as discussed.  Continue to monitor your blood  pressure and notify me if you still see the number 90 on the bottom.   It was a pleasure to see you today! [name], NP-C    I discussed the assessment and treatment plan with the patient. The patient was provided an opportunity to ask questions and all were answered. The patient agreed with the plan and demonstrated an understanding of the instructions.   The patient was advised to call back or seek an in-person evaluation if the symptoms worsen or if the condition fails to improve as anticipated.     [name], NP    Review of Systems  Constitutional: Positive for unexpected weight change. Negative for chills, fatigue and fever.  Eyes: Negative for visual disturbance.  Respiratory: Negative for shortness of breath.   Cardiovascular: Negative for chest pain.  Gastrointestinal: Negative for abdominal pain, blood in stool, constipation, diarrhea, nausea and vomiting.  Genitourinary: Negative for vaginal bleeding.  Neurological: Negative for dizziness and headaches.       Past Medical History:  Diagnosis Date  . HTN (hypertension)   . 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 Marital status: Married    Spouse name: Not on file  . Number of children: 2  . Years of education: Not on file  . Highest education level: Not on file  Occupational History    Comment: front desk   Social Needs  . Financial resource strain: Not on file  . Food insecurity:    Worry: Not on  file    Inability: Not on file  . Transportation needs:    Medical: Not on file    Non-medical: Not on file  Tobacco Use  . Smoking status: Never Smoker  . Smokeless tobacco: Never Used  Substance and Sexual Activity  . Alcohol use: Yes    Alcohol/week: 0.0 standard drinks    Comment: rare  . Drug use: No  . Sexual activity: Not on file  Lifestyle  . Physical activity:    Days per week: Not on file    Minutes per session: Not on file  . Stress: Not  on file  Relationships  . Social connections:    Talks on phone: Not on file    Gets together: Not on file    Attends religious service: Not on file    Active member of club or organization: Not on file    Attends meetings of clubs or organizations: Not on file    Relationship status: Not on file  . Intimate partner violence:    Fear of current or ex partner: Not on file    Emotionally abused: Not on file    Physically abused: Not on file    Forced sexual activity: Not on file  Other Topics Concern  . Not on file  Social History Narrative   From [place].   Married.   2 children.   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    Past Surgical History:  Procedure Laterality Date  . CESAREAN SECTION     x2  . MASS EXCISION Right 01/18/2013   Procedure: EXCISION right scapular cyst;  Surgeon: Axel Filler, MD;  Location: [facility];  Service: General;  Laterality: Right;    Family History  Problem Relation Age of Onset  . Diabetes Father   . Diabetes Mother   . Heart disease Mother   . Diabetes Sister   . Hypertension Sister     No Known Allergies  Current Outpatient Medications on File Prior to Visit  Medication Sig Dispense Refill  . amLODipine (NORVASC) 10 MG tablet Take 1 tablet (10 mg total) by mouth daily. For blood pressure. 90 tablet 3  . ferrous sulfate 325 (65 FE) MG tablet Take 325 mg by mouth every morning.     . naproxen (NAPROSYN) 500 MG tablet Take 1 tablet (500 mg total) by mouth 2 (two) times daily with a meal. 14 tablet 0  . valsartan-hydrochlorothiazide (DIOVAN-HCT) 320-25 MG tablet Take 1 tablet by mouth daily. For blood pressure. 90 tablet 3   No current facility-administered medications on file prior to visit.     Wt 175 lb (79.4 kg)   LMP 06/19/2017 (Approximate)   BMI 31.00 kg/m    Objective:   Physical Exam  Constitutional: She is oriented to person, place, and time. She appears  well-nourished.  Respiratory: Effort normal. No respiratory distress.  Neurological: She is alert and oriented to person, place, and time.  Psychiatric: She has a normal mood and affect.           Assessment & Plan:

## 2019-03-28 ENCOUNTER — Other Ambulatory Visit (INDEPENDENT_AMBULATORY_CARE_PROVIDER_SITE_OTHER): Payer: Commercial Managed Care - PPO

## 2019-03-28 DIAGNOSIS — D72829 Elevated white blood cell count, unspecified: Secondary | ICD-10-CM

## 2019-03-28 DIAGNOSIS — R634 Abnormal weight loss: Secondary | ICD-10-CM | POA: Diagnosis not present

## 2019-03-28 DIAGNOSIS — R7989 Other specified abnormal findings of blood chemistry: Secondary | ICD-10-CM

## 2019-03-28 LAB — COMPREHENSIVE METABOLIC PANEL
ALT: 23 U/L (ref 0–35)
AST: 22 U/L (ref 0–37)
Albumin: 3.8 g/dL (ref 3.5–5.2)
Alkaline Phosphatase: 85 U/L (ref 39–117)
BUN: 13 mg/dL (ref 6–23)
CO2: 26 mEq/L (ref 19–32)
Calcium: 10.3 mg/dL (ref 8.4–10.5)
Chloride: 106 mEq/L (ref 96–112)
Creatinine, Ser: 0.3 mg/dL — ABNORMAL LOW (ref 0.40–1.20)
GFR: 236.67 mL/min (ref >60.00)
Glucose, Bld: 120 mg/dL — ABNORMAL HIGH (ref 70–99)
Potassium: 3.3 mEq/L — ABNORMAL LOW (ref 3.5–5.1)
Sodium: 142 mEq/L (ref 135–145)
Total Bilirubin: 0.6 mg/dL (ref 0.2–1.2)
Total Protein: 7.4 g/dL (ref 6.0–8.3)

## 2019-03-28 LAB — CBC
HCT: 32.9 % — ABNORMAL LOW (ref 36.0–46.0)
Hemoglobin: 11.1 g/dL — ABNORMAL LOW (ref 12.0–15.0)
MCHC: 33.7 g/dL (ref 30.0–36.0)
MCV: 70.7 fl — ABNORMAL LOW (ref 78.0–100.0)
Platelets: 305 10*3/uL (ref 150.0–400.0)
RBC: 4.65 Mil/uL (ref 3.87–5.11)
RDW: 15 % (ref 11.5–15.5)
WBC: 16.8 10*3/uL — ABNORMAL HIGH (ref 4.0–10.5)

## 2019-03-28 LAB — TIQ- AMBIGUOUS ORDER: UNCLEAR ORDER:: 4456

## 2019-03-28 LAB — HEMOGLOBIN A1C: Hgb A1c MFr Bld: 5.4 % (ref 4.6–6.5)

## 2019-03-28 LAB — T4, FREE: Free T4: 5.96 ng/dL — ABNORMAL HIGH (ref 0.60–1.60)

## 2019-03-28 LAB — TSH: TSH: <0.01 u[IU]/mL — ABNORMAL LOW (ref 0.35–4.50)

## 2019-03-29 ENCOUNTER — Other Ambulatory Visit: Payer: Self-pay | Admitting: Primary Care

## 2019-03-29 DIAGNOSIS — R7989 Other specified abnormal findings of blood chemistry: Secondary | ICD-10-CM

## 2019-03-29 DIAGNOSIS — R634 Abnormal weight loss: Secondary | ICD-10-CM

## 2019-03-29 LAB — MANUAL DIFFERENTIAL
HCT: 33.6 % — ABNORMAL LOW (ref 35.0–45.0)
Hemoglobin: 11 g/dL — ABNORMAL LOW (ref 11.7–15.5)
MCH: 24.3 pg — ABNORMAL LOW (ref 27.0–33.0)
MCHC: 32.7 g/dL (ref 32.0–36.0)
MCV: 74.3 fL — ABNORMAL LOW (ref 80.0–100.0)
MPV: 12.5 fL (ref 7.5–12.5)
Platelets: 317 10*3/uL (ref 140–400)
RBC: 4.52 10*6/uL (ref 3.80–5.10)
RDW: 15.4 % — ABNORMAL HIGH (ref 11.0–15.0)
WBC: 16.6 10*3/uL — ABNORMAL HIGH (ref 3.8–10.8)

## 2019-03-29 LAB — DIFFERENTIAL, MANUAL RFLX
Absolute Lymphocytes: 5793 cells/uL — ABNORMAL HIGH (ref 850–3900)
Absolute Monocytes: 1062 cells/uL — ABNORMAL HIGH (ref 200–950)
Basophils Absolute: 0 cells/uL (ref 0–200)
Basophils Relative: 0 %
Eosinophils Absolute: 149 cells/uL (ref 15–500)
Eosinophils Relative: 0.9 %
Monocytes Relative: 6.4 %
Neutro Abs: 9595 cells/uL — ABNORMAL HIGH (ref 1500–7800)
Neutrophils Relative %: 57.8 %
Total Lymphocyte: 34.9 %

## 2019-03-29 LAB — PATHOLOGIST SMEAR REVIEW

## 2019-03-29 LAB — TEST AUTHORIZATION

## 2019-03-29 LAB — TEST AUTHORIZATION 2

## 2019-03-29 LAB — HIV ANTIBODY (ROUTINE TESTING W REFLEX): HIV 1&2 Ab, 4th Generation: NONREACTIVE

## 2019-04-03 ENCOUNTER — Ambulatory Visit: Payer: Commercial Managed Care - PPO | Admitting: Internal Medicine

## 2019-04-03 ENCOUNTER — Other Ambulatory Visit: Payer: Self-pay

## 2019-04-03 ENCOUNTER — Encounter: Payer: Self-pay | Admitting: Internal Medicine

## 2019-04-03 VITALS — BP 138/92 | HR 109 | Temp 98.0°F | Ht 63.5 in | Wt 178.8 lb

## 2019-04-03 DIAGNOSIS — E0590 Thyrotoxicosis, unspecified without thyrotoxic crisis or storm: Secondary | ICD-10-CM

## 2019-04-03 MED ORDER — ATENOLOL 50 MG PO TABS: 50.0000 mg | ORAL_TABLET | Freq: Every day | ORAL | 2 refills | Status: DC

## 2019-04-03 MED ORDER — METHIMAZOLE 10 MG PO TABS: 20.0000 mg | ORAL_TABLET | Freq: Every day | ORAL | 3 refills | Status: DC

## 2019-04-03 NOTE — Progress Notes (Signed)
Name: Amber [name]  MRN/ DOB: [id number], [birth year]    Age/ Sex: 48 y.o., female    PCP: [name], NP   Reason for Endocrinology Evaluation: Hyperthyroidism     Date of Initial Endocrinology Evaluation: 04/03/2019     HPI: Amber [name] is a 48 y.o. female with a past medical history of HTN. The patient presented for initial endocrinology clinic visit on 04/03/2019 for consultative assistance with her hyperthyroidism.   Pt presented to her  PCP in 03/2019 with c/o unexplained weight loss for ~ 4 months. Her TSH was suppressed at < 0.01 uIU/mL with elevated FT4 at 5.96 ng/dL    Today she is still c/o weight loss as well as palpitations, anxiety, insomnia, sob, tremors  and weakness.  Denies visual changes  Denies local neck symptoms   No prior radiation exposure  No biotin   No FH of thyroid disease.      HISTORY:  Past Medical History:  Past Medical History:  Diagnosis Date  . HTN (hypertension)   . 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 CESAREAN SECTION     x2  . MASS EXCISION Right 01/18/2013   Procedure: EXCISION right scapular cyst;  Surgeon: Ralene Ok, MD;  Location: [facility];  Service: General;  Laterality: Right;      Social History:  reports that she has never smoked. She has never used smokeless tobacco. She reports current alcohol use. She reports that she does not use drugs.  Family History: family history includes Diabetes in her father, mother, and sister; Heart disease in her mother; Hypertension in her sister.   HOME MEDICATIONS: Allergies as of 04/03/2019   No Known Allergies     Medication List       Accurate as of April 03, 2019  3:14 PM. If you have any questions, ask your nurse or doctor.        amLODipine 10 MG tablet Commonly known as: NORVASC Take 1 tablet (10 mg total) by mouth daily. For blood pressure.   ferrous sulfate 325 (65 FE) MG  tablet Take 325 mg by mouth every morning.   naproxen 500 MG tablet Commonly known as: Naprosyn Take 1 tablet (500 mg total) by mouth 2 (two) times daily with a meal.   valsartan-hydrochlorothiazide 320-25 MG tablet Commonly known as: DIOVAN-HCT Take 1 tablet by mouth daily. For blood pressure.         REVIEW OF SYSTEMS: A comprehensive ROS was conducted with the patient and is negative except as per HPI and below:  ROS     OBJECTIVE:  VS: BP (!) 138/92 (BP Location: Right Arm, Patient Position: Sitting, Cuff Size: Normal)   Pulse (!) 109   Temp 98 F (36.7 C) (Oral)   Ht 5' 3.5" (1.613 m)   Wt 178 lb 12.8 oz (81.1 kg)   LMP 06/19/2017 (Approximate)   SpO2 97%   BMI 31.18 kg/m    Wt Readings from Last 3 Encounters:  04/03/19 178 lb 12.8 oz (81.1 kg)  03/27/19 175 lb (79.4 kg)  11/28/18 210 lb 8 oz (95.5 kg)     EXAM: General: Pt appears well and is in NAD  Hydration: Well-hydrated with moist mucous membranes and good skin turgor  Eyes: External eye exam normal with a stare,but no  lid lag or exophthalmos.  EOM intact.    Ears, Nose, Throat: Hearing: Grossly intact bilaterally Dental:  Good dentition  Throat: Clear without mass, erythema or exudate  Neck: General: Supple without adenopathy. Thyroid: Thyroid size enlarged  R> L .  No nodules appreciated. + thyroid bruit.  Lungs: Clear with good BS bilat with no rales, rhonchi, or wheezes  Heart: Auscultation: RRR.Systolic murmur noted   Abdomen: Normoactive bowel sounds, soft, nontender, without masses or organomegaly palpable  Extremities:  BL LE: No pretibial edema normal ROM and strength.  Skin: Hair: Texture and amount normal with gender appropriate distribution Skin Inspection: No rashes Skin Palpation: Skin temperature, texture, and thickness normal to palpation  Neuro: Cranial nerves: II - XII grossly intact  Motor: Normal strength throughout DTRs: 2+ and symmetric in UE without delay in relaxation phase   Mental Status: Judgment, insight: Intact Orientation: Oriented to time, place, and person Mood and affect: No depression, anxiety, or agitation     DATA REVIEWED:  Results for KAYLON, TAJIMA (MRN [id number]) as of 04/05/2019 12:25  Ref. Range 03/28/2019 08:51 03/28/2019 15:16 04/03/2019 15:36  TSH Latest Ref Range: 0.35 - 4.50 uIU/mL <0.01 (L)  <0.01 (L)  Triiodothyronine (T3) Latest Ref Range: 76 - 181 ng/dL   >009 (H)  Q3,RAQT(MAUQJF) Latest Ref Range: 0.60 - 1.60 ng/dL  3.54 (H) 5.62 (H)    ASSESSMENT/PLAN/RECOMMENDATIONS:   1. Hyperthyroidism :  - Clinically and biochemicaly she is hyperthyroid  - Discussed D/D of thyroiditis, vs graves' disease vs toxic nodule (s) - We discussed that Graves' Disease is a result of an autoimmune condition involving the thyroid.    We discussed with pt the benefits of methimazole in the Tx of hyperthyroidism, as well as the possible side effects/complications of anti-thyroid drug Tx (specifically detailing the rare, but serious side effect of agranulocytosis). She was informed of need for regular thyroid function monitoring while on methimazole to ensure appropriate dosage without over-treatment. As well, we discussed the possible side effects of methimazole including the chance of rash, the small chance of liver irritation/juandice and the <=1 in 300-400 chance of sudden onset agranulocytosis.  We discussed importance of going to ED promptly (and stopping methimazole) if shewere to develop significant fever with severe sore throat of other evidence of acute infection.    We extensively discussed the various treatment options for hyperthyroidism and Graves disease including ablation therapy with radioactive iodine versus antithyroid drug treatment versus surgical therapy.  We recommended to the patient that we felt, at this time, that thionamide therapy would be most optimal.  We discussed the various possible benefits versus side effects of the various  therapies.   I carefully explained to the patient that one of the consequences of I-131 ablation treatment would likely be permanent hypothyroidism which would require long-term replacement therapy with LT4.  Medications : Methimazole 20 mg daily  Atenolol 50 mg daily      Labs in 6 weeks F/u in 3 months     Signed electronically by: Lyndle Herrlich, MD  [facility]  [facility] [address] [place] [place], [place] [address] Phone: [phone] FAX: [phone]   CC: Doreene Nest, NP [address] [place] [address] Phone: [phone] Fax: [phone]   Return to Endocrinology clinic as below: Future Appointments  Date Time Provider Department Center  04/03/2019  3:20 PM Jermaine Neuharth, Konrad Dolores, MD [id number] None

## 2019-04-03 NOTE — Patient Instructions (Signed)
We recommend that you follow these hyperthyroidism instructions at home:  1) Take Methimazole 20 mg ( take 2 tablets) once  a day  If you develop severe sore throat with high fevers OR develop unexplained yellowing of your skin, eyes, under your tongue, severe abdominal pain with nausea or vomiting --> then please get evaluated immediately.  2) Atenolol 50 mg one time a day 3) Get repeat thyroid labs 6 weeks.   It is ESSENTIAL to get follow-up labs to help avoid over or undertreatment of your hyperthyroidism - both of which can be dangerous to your health.

## 2019-04-04 LAB — T4, FREE: Free T4: 5.42 ng/dL — ABNORMAL HIGH (ref 0.60–1.60)

## 2019-04-04 LAB — TSH: TSH: <0.01 u[IU]/mL — ABNORMAL LOW (ref 0.35–4.50)

## 2019-04-05 ENCOUNTER — Encounter: Payer: Self-pay | Admitting: Internal Medicine

## 2019-04-05 LAB — T3: T3, Total: >800 ng/dL — ABNORMAL HIGH (ref 76–181)

## 2019-04-05 LAB — TRAB (TSH RECEPTOR BINDING ANTIBODY): TRAB: >40 IU/L — ABNORMAL HIGH (ref <=2.00)

## 2019-05-02 ENCOUNTER — Telehealth: Payer: Self-pay | Admitting: Internal Medicine

## 2019-05-02 ENCOUNTER — Other Ambulatory Visit: Payer: Self-pay

## 2019-05-02 NOTE — Telephone Encounter (Signed)
Error

## 2019-05-02 NOTE — Telephone Encounter (Signed)
MEDICATION: methimazole (TAPAZOLE) 10 MG tablet  PHARMACY:  [id number] - [place],   IS THIS A 90 DAY SUPPLY :   IS PATIENT OUT OF MEDICATION:   IF NOT; HOW MUCH IS LEFT: Today  LAST APPOINTMENT DATE: @6 /17/2020  NEXT APPOINTMENT DATE:@7 /31/2020  DO WE HAVE YOUR PERMISSION TO LEAVE A DETAILED MESSAGE:  OTHER COMMENTS:    **Let patient know to contact pharmacy at the end of the day to make sure medication is ready. **  ** Please notify patient to allow 48-72 hours to process**  **Encourage patient to contact the pharmacy for refills or they can request refills through [facility]**

## 2019-05-02 NOTE — Telephone Encounter (Signed)
Could you please clarify? Pt has Methimazole 10mg  2 tabs PO QD on file, but last office note stated that pt should take Methimazole 20mg  (take 2 tablets) once daily.  Does pt need to stay on 10mg  2 tabs?

## 2019-05-02 NOTE — Telephone Encounter (Signed)
Per Dr. Cherlynn Polo for his instructions:  Take Methimazole 20 mg ( take 2 tablets) once  a day  Please ask her to take this with a meal and if she has GI intolerance from it, to split it in 10 mg twice a day with meals.

## 2019-05-03 ENCOUNTER — Other Ambulatory Visit: Payer: Self-pay

## 2019-05-03 MED ORDER — METHIMAZOLE 10 MG PO TABS: ORAL_TABLET | ORAL | 3 refills | Status: DC

## 2019-05-03 NOTE — Telephone Encounter (Signed)
Rx has been sent  

## 2019-05-15 ENCOUNTER — Other Ambulatory Visit: Payer: Self-pay | Admitting: Internal Medicine

## 2019-05-15 DIAGNOSIS — E0590 Thyrotoxicosis, unspecified without thyrotoxic crisis or storm: Secondary | ICD-10-CM

## 2019-05-17 ENCOUNTER — Telehealth: Payer: Self-pay | Admitting: Internal Medicine

## 2019-05-17 ENCOUNTER — Other Ambulatory Visit: Payer: Self-pay

## 2019-05-17 ENCOUNTER — Other Ambulatory Visit (INDEPENDENT_AMBULATORY_CARE_PROVIDER_SITE_OTHER): Payer: Commercial Managed Care - PPO

## 2019-05-17 DIAGNOSIS — E0590 Thyrotoxicosis, unspecified without thyrotoxic crisis or storm: Secondary | ICD-10-CM | POA: Diagnosis not present

## 2019-05-17 LAB — TSH: TSH: 0.01 u[IU]/mL — ABNORMAL LOW (ref 0.35–4.50)

## 2019-05-17 LAB — T4, FREE: Free T4: 1.55 ng/dL (ref 0.60–1.60)

## 2019-05-17 NOTE — Telephone Encounter (Signed)
Left a message for a call back   Abby Jaralla Jalissa Heinzelman, MD  [facility]  [facility] [address] [place], [place] [address] Phone: [phone] FAX: [phone]  

## 2019-05-20 ENCOUNTER — Other Ambulatory Visit: Payer: Self-pay

## 2019-05-20 ENCOUNTER — Telehealth: Payer: Self-pay | Admitting: Internal Medicine

## 2019-05-20 ENCOUNTER — Ambulatory Visit (HOSPITAL_COMMUNITY): Payer: Commercial Managed Care - PPO

## 2019-05-20 ENCOUNTER — Emergency Department (HOSPITAL_COMMUNITY): Payer: Commercial Managed Care - PPO

## 2019-05-20 ENCOUNTER — Inpatient Hospital Stay (HOSPITAL_COMMUNITY)
Admission: EM | Admit: 2019-05-20 | Discharge: 2019-05-25 | DRG: 246 | Disposition: A | Discharge status: Home / Self Care | Payer: Commercial Managed Care - PPO | Attending: Internal Medicine | Admitting: Internal Medicine

## 2019-05-20 DIAGNOSIS — R0902 Hypoxemia: Secondary | ICD-10-CM

## 2019-05-20 DIAGNOSIS — I5043 Acute on chronic combined systolic (congestive) and diastolic (congestive) heart failure: Secondary | ICD-10-CM | POA: Diagnosis not present

## 2019-05-20 DIAGNOSIS — I739 Peripheral vascular disease, unspecified: Secondary | ICD-10-CM | POA: Diagnosis present

## 2019-05-20 DIAGNOSIS — R570 Cardiogenic shock: Secondary | ICD-10-CM

## 2019-05-20 DIAGNOSIS — R0602 Shortness of breath: Secondary | ICD-10-CM | POA: Diagnosis not present

## 2019-05-20 DIAGNOSIS — Z8249 Family history of ischemic heart disease and other diseases of the circulatory system: Secondary | ICD-10-CM

## 2019-05-20 DIAGNOSIS — I10 Essential (primary) hypertension: Secondary | ICD-10-CM | POA: Diagnosis present

## 2019-05-20 DIAGNOSIS — Z79899 Other long term (current) drug therapy: Secondary | ICD-10-CM

## 2019-05-20 DIAGNOSIS — J9601 Acute respiratory failure with hypoxia: Secondary | ICD-10-CM

## 2019-05-20 DIAGNOSIS — Z955 Presence of coronary angioplasty implant and graft: Secondary | ICD-10-CM

## 2019-05-20 DIAGNOSIS — I214 Non-ST elevation (NSTEMI) myocardial infarction: Principal | ICD-10-CM | POA: Diagnosis present

## 2019-05-20 DIAGNOSIS — R7989 Other specified abnormal findings of blood chemistry: Secondary | ICD-10-CM | POA: Diagnosis not present

## 2019-05-20 DIAGNOSIS — I272 Pulmonary hypertension, unspecified: Secondary | ICD-10-CM | POA: Diagnosis present

## 2019-05-20 DIAGNOSIS — R778 Other specified abnormalities of plasma proteins: Secondary | ICD-10-CM

## 2019-05-20 DIAGNOSIS — G9340 Encephalopathy, unspecified: Secondary | ICD-10-CM | POA: Diagnosis present

## 2019-05-20 DIAGNOSIS — I361 Nonrheumatic tricuspid (valve) insufficiency: Secondary | ICD-10-CM | POA: Diagnosis not present

## 2019-05-20 DIAGNOSIS — Z791 Long term (current) use of non-steroidal anti-inflammatories (NSAID): Secondary | ICD-10-CM | POA: Diagnosis not present

## 2019-05-20 DIAGNOSIS — E1165 Type 2 diabetes mellitus with hyperglycemia: Secondary | ICD-10-CM | POA: Diagnosis present

## 2019-05-20 DIAGNOSIS — E785 Hyperlipidemia, unspecified: Secondary | ICD-10-CM | POA: Diagnosis present

## 2019-05-20 DIAGNOSIS — J9690 Respiratory failure, unspecified, unspecified whether with hypoxia or hypercapnia: Secondary | ICD-10-CM

## 2019-05-20 DIAGNOSIS — Z833 Family history of diabetes mellitus: Secondary | ICD-10-CM

## 2019-05-20 DIAGNOSIS — M79609 Pain in unspecified limb: Secondary | ICD-10-CM

## 2019-05-20 DIAGNOSIS — I2510 Atherosclerotic heart disease of native coronary artery without angina pectoris: Secondary | ICD-10-CM | POA: Diagnosis present

## 2019-05-20 DIAGNOSIS — J189 Pneumonia, unspecified organism: Secondary | ICD-10-CM | POA: Diagnosis present

## 2019-05-20 DIAGNOSIS — Z20828 Contact with and (suspected) exposure to other viral communicable diseases: Secondary | ICD-10-CM | POA: Diagnosis present

## 2019-05-20 DIAGNOSIS — E876 Hypokalemia: Secondary | ICD-10-CM | POA: Diagnosis present

## 2019-05-20 DIAGNOSIS — J9691 Respiratory failure, unspecified with hypoxia: Secondary | ICD-10-CM | POA: Diagnosis present

## 2019-05-20 DIAGNOSIS — J810 Acute pulmonary edema: Secondary | ICD-10-CM

## 2019-05-20 DIAGNOSIS — R079 Chest pain, unspecified: Secondary | ICD-10-CM

## 2019-05-20 DIAGNOSIS — Z4659 Encounter for fitting and adjustment of other gastrointestinal appliance and device: Secondary | ICD-10-CM

## 2019-05-20 DIAGNOSIS — Z978 Presence of other specified devices: Secondary | ICD-10-CM

## 2019-05-20 DIAGNOSIS — E0590 Thyrotoxicosis, unspecified without thyrotoxic crisis or storm: Secondary | ICD-10-CM | POA: Diagnosis present

## 2019-05-20 DIAGNOSIS — F419 Anxiety disorder, unspecified: Secondary | ICD-10-CM | POA: Diagnosis present

## 2019-05-20 DIAGNOSIS — I371 Nonrheumatic pulmonary valve insufficiency: Secondary | ICD-10-CM | POA: Diagnosis not present

## 2019-05-20 DIAGNOSIS — R7303 Prediabetes: Secondary | ICD-10-CM | POA: Diagnosis present

## 2019-05-20 DIAGNOSIS — I509 Heart failure, unspecified: Secondary | ICD-10-CM

## 2019-05-20 HISTORY — DX: Other specified abnormal findings of blood chemistry: R79.89

## 2019-05-20 HISTORY — DX: Non-ST elevation (NSTEMI) myocardial infarction: I21.4

## 2019-05-20 HISTORY — DX: Acute respiratory failure with hypoxia: J96.01

## 2019-05-20 LAB — CBC
HCT: 36.2 % (ref 36.0–46.0)
Hemoglobin: 12.4 g/dL (ref 12.0–15.0)
MCH: 24.7 pg — ABNORMAL LOW (ref 26.0–34.0)
MCHC: 34.3 g/dL (ref 30.0–36.0)
MCV: 72.1 fL — ABNORMAL LOW (ref 80.0–100.0)
Platelets: 389 10*3/uL (ref 150–400)
RBC: 5.02 MIL/uL (ref 3.87–5.11)
RDW: 15.7 % — ABNORMAL HIGH (ref 11.5–15.5)
WBC: 19.8 10*3/uL — ABNORMAL HIGH (ref 4.0–10.5)
nRBC: 0 % (ref 0.0–0.2)

## 2019-05-20 LAB — PHOSPHORUS: Phosphorus: 4 mg/dL (ref 2.5–4.6)

## 2019-05-20 LAB — HEPATIC FUNCTION PANEL
ALT: 15 U/L (ref 0–44)
AST: 35 U/L (ref 15–41)
Albumin: 3.3 g/dL — ABNORMAL LOW (ref 3.5–5.0)
Alkaline Phosphatase: 114 U/L (ref 38–126)
Bilirubin, Direct: <0.1 mg/dL (ref 0.0–0.2)
Total Bilirubin: 0.4 mg/dL (ref 0.3–1.2)
Total Protein: 7.1 g/dL (ref 6.5–8.1)

## 2019-05-20 LAB — D-DIMER, QUANTITATIVE: D-Dimer, Quant: <0.27 ug/mL-FEU (ref 0.00–0.50)

## 2019-05-20 LAB — TRIGLYCERIDES: Triglycerides: 100 mg/dL (ref <150)

## 2019-05-20 LAB — BASIC METABOLIC PANEL
Anion gap: 15 (ref 5–15)
BUN: 11 mg/dL (ref 6–20)
CO2: 20 mmol/L — ABNORMAL LOW (ref 22–32)
Calcium: 9.4 mg/dL (ref 8.9–10.3)
Chloride: 104 mmol/L (ref 98–111)
Creatinine, Ser: 0.67 mg/dL (ref 0.44–1.00)
GFR calc Af Amer: >60 mL/min (ref >60)
GFR calc non Af Amer: >60 mL/min (ref >60)
Glucose, Bld: 144 mg/dL — ABNORMAL HIGH (ref 70–99)
Potassium: 3.2 mmol/L — ABNORMAL LOW (ref 3.5–5.1)
Sodium: 139 mmol/L (ref 135–145)

## 2019-05-20 LAB — MAGNESIUM: Magnesium: 1.7 mg/dL (ref 1.7–2.4)

## 2019-05-20 LAB — TSH: TSH: <0.01 u[IU]/mL — ABNORMAL LOW (ref 0.350–4.500)

## 2019-05-20 LAB — CK: Total CK: 324 U/L — ABNORMAL HIGH (ref 38–234)

## 2019-05-20 LAB — TROPONIN I (HIGH SENSITIVITY)
Troponin I (High Sensitivity): 57 ng/L — ABNORMAL HIGH (ref <18)
Troponin I (High Sensitivity): 162 ng/L (ref <18)
Troponin I (High Sensitivity): 1330 ng/L (ref <18)

## 2019-05-20 LAB — T4, FREE: Free T4: 1.47 ng/dL — ABNORMAL HIGH (ref 0.61–1.12)

## 2019-05-20 LAB — LACTATE DEHYDROGENASE: LDH: 163 U/L (ref 98–192)

## 2019-05-20 LAB — I-STAT BETA HCG BLOOD, ED (MC, WL, AP ONLY): I-stat hCG, quantitative: <5 m[IU]/mL (ref <5)

## 2019-05-20 LAB — SARS CORONAVIRUS 2 BY RT PCR (HOSPITAL ORDER, PERFORMED IN ~~LOC~~ HOSPITAL LAB): SARS Coronavirus 2: NEGATIVE

## 2019-05-20 LAB — BRAIN NATRIURETIC PEPTIDE: B Natriuretic Peptide: 137.5 pg/mL — ABNORMAL HIGH (ref 0.0–100.0)

## 2019-05-20 LAB — PROCALCITONIN: Procalcitonin: <0.1 ng/mL

## 2019-05-20 LAB — FIBRINOGEN: Fibrinogen: 594 mg/dL — ABNORMAL HIGH (ref 210–475)

## 2019-05-20 MED ORDER — HEPARIN BOLUS VIA INFUSION
4000.0000 [IU] | Freq: Once | INTRAVENOUS | Status: AC
  Administered 2019-05-21: 4000 [IU] via INTRAVENOUS
  Filled 2019-05-20: qty 4000

## 2019-05-20 MED ORDER — SODIUM CHLORIDE 0.9 % IV BOLUS
1000.0000 mL | Freq: Once | INTRAVENOUS | Status: AC
  Administered 2019-05-20: 18:00:00 1000 mL via INTRAVENOUS

## 2019-05-20 MED ORDER — IOHEXOL 300 MG/ML  SOLN
75.0000 mL | Freq: Once | INTRAMUSCULAR | Status: AC | PRN
  Administered 2019-05-20: 75 mL via INTRAVENOUS

## 2019-05-20 MED ORDER — ASPIRIN 81 MG PO CHEW
CHEWABLE_TABLET | ORAL | Status: AC
  Administered 2019-05-20: 23:00:00 81 mg
  Filled 2019-05-20: qty 4

## 2019-05-20 MED ORDER — FENTANYL CITRATE (PF) 100 MCG/2ML IJ SOLN
50.0000 ug | Freq: Once | INTRAMUSCULAR | Status: AC
  Administered 2019-05-20: 18:00:00 50 ug via INTRAVENOUS
  Filled 2019-05-20: qty 2

## 2019-05-20 MED ORDER — SODIUM CHLORIDE 0.9 % IV SOLN
2.0000 g | INTRAVENOUS | Status: DC
  Administered 2019-05-21 – 2019-05-22 (×3): 2 g via INTRAVENOUS
  Filled 2019-05-20: qty 2
  Filled 2019-05-20: qty 20
  Filled 2019-05-20: qty 2
  Filled 2019-05-20: qty 20

## 2019-05-20 MED ORDER — HEPARIN (PORCINE) 25000 UT/250ML-% IV SOLN
1500.0000 [IU]/h | INTRAVENOUS | Status: DC
  Administered 2019-05-21: 1400 [IU]/h via INTRAVENOUS
  Administered 2019-05-21: 900 [IU]/h via INTRAVENOUS
  Filled 2019-05-20 (×2): qty 250

## 2019-05-20 MED ORDER — SODIUM CHLORIDE 0.9 % IV SOLN
500.0000 mg | INTRAVENOUS | Status: DC
  Administered 2019-05-21 – 2019-05-22 (×3): 500 mg via INTRAVENOUS
  Filled 2019-05-20 (×4): qty 500

## 2019-05-20 NOTE — Consult Note (Signed)
[facility] Consult Note   Primary Physician:  Doreene Nest, NP  Primary Cardiologist:   None  Reason for Consultation:  Chest pain and shortness of breath  HPI:    Amber [name] is a 48 year old female with a past medical history significant for recently diagnosed hyperthyroidism, essential hypertension, and anemia who presented to the hospital with complaints of chest pain and shortness of breath.  On reviewing the EMR it is noted that the patient was seen in June of this year with complaints of unexplained weight loss, palpitations, anxiety, insomnia, and shortness of breath. Her TSH was suppressed at < 0.01 uIU/mL with elevated FT4 at 5.96 ng/dL.  For a diagnosis of hyperthyroidism the patient was started on methimazole.  She presented today with complaints of central chest pain starting in the afternoon associated with palpitations and shortness of breath.  She took aspirin at home with limited relief.  In the ED her ECG revealed sinus tachycardia (rate 130s) with voltage criteria for LVH.  Additionally there were ST depressions consistent with repolarization abnormalities.  The chest x-ray was remarkable for patchy areas of consolidation with a few air bronchograms. The d-dimer was less than 0.27.  The high-sensitivity troponin I was 57 and 162.  The WBC count was 19.8. A CT chest revealed patchy ground-glass infiltrates identified bilaterally predominately within the bases. An acute atypical infection (as well as other non-infectious etiologies) and in particular, a viral pneumonia was suspected.  She was initially treated with fentanyl, azithromycin and ceftriaxone.  Cardiology service was consulted because of her sinus tachycardia, ST changes on ECG and elevated high-sensitivity troponins.   Home Medications Prior to Admission medications   Medication Sig Start Date End Date Taking? Authorizing Provider  atenolol (TENORMIN) 50 MG tablet Take 1 tablet (50 mg total) by  mouth daily. 04/03/19  Yes Shamleffer, Konrad Dolores, MD  ferrous sulfate 325 (65 FE) MG tablet Take 325 mg by mouth every morning.    Yes [provider]  ibuprofen (ADVIL) 200 MG tablet Take 200 mg by mouth every 6 (six) hours as needed for moderate pain (leg pain).   Yes [provider]  methimazole (TAPAZOLE) 10 MG tablet Take 2 tablets (40mg  total) by mouth once daily. Patient taking differently: Take 20 mg by mouth daily.  05/03/19  Yes Reather Littler, MD  amLODipine (NORVASC) 10 MG tablet Take 1 tablet (10 mg total) by mouth daily. For blood pressure. Patient not taking: Reported on 05/20/2019 11/28/18   Doreene Nest, NP  naproxen (NAPROSYN) 500 MG tablet Take 1 tablet (500 mg total) by mouth 2 (two) times daily with a meal. Patient not taking: Reported on 05/20/2019 11/28/18   Doreene Nest, NP  valsartan-hydrochlorothiazide (DIOVAN-HCT) 320-25 MG tablet Take 1 tablet by mouth daily. For blood pressure. 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    Past Surgical History: Past Surgical History:  Procedure Laterality Date   CESAREAN SECTION     x2   MASS EXCISION Right 01/18/2013   Procedure: EXCISION right scapular cyst;  Surgeon: Axel Filler, MD;  Location: [facility];  Service: General;  Laterality: Right;    Family History: Family History  Problem Relation Age of Onset   Diabetes Father    Diabetes Mother    Heart disease Mother  Diabetes Sister    Hypertension Sister     Social History: Social History   Socioeconomic History   Marital status: Married    Spouse name: Not on file   Number of children: 2   Years of education: Not on file   Highest education level: Not on file  Occupational History    Comment: front desk   Social Needs   Financial resource strain: Not on file     Food insecurity    Worry: Not on file    Inability: Not on file   Transportation needs    Medical: Not on file    Non-medical: Not on file  Tobacco Use   Smoking status: Never Smoker   Smokeless tobacco: Never Used  Substance and Sexual Activity   Alcohol use: Yes    Alcohol/week: 0.0 standard drinks    Comment: rare   Drug use: No   Sexual activity: Not on file  Lifestyle   Physical activity    Days per week: Not on file    Minutes per session: Not on file   Stress: Not on file  Relationships   Social connections    Talks on phone: Not on file    Gets together: Not on file    Attends religious service: Not on file    Active member of club or organization: Not on file    Attends meetings of clubs or organizations: Not on file    Relationship status: Not on file  Other Topics Concern   Not on file  Social History Narrative   From [place].   Married.   2 children.   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    Allergies:  No Known Allergies   Review of Systems: [y] = yes, [ ]  = no    General: Weight gain [ ] ; Weight loss [ ] ; Anorexia [ ] ; Fatigue [ ] ; Fever [ ] ; Chills [ ] ; Weakness [ ]    Cardiac: Chest pain/pressure [Y]; Resting SOB [Y]; Exertional SOB [ ] ; Orthopnea [ ] ; Pedal Edema [ ] ; Palpitations [ ] ; Syncope [ ] ; Presyncope [ ] ; Paroxysmal nocturnal dyspnea[ ]    Pulmonary: Cough [ ] ; Wheezing[ ] ; Hemoptysis[ ] ; Sputum [ ] ; Snoring [ ]    GI: Vomiting[ ] ; Dysphagia[ ] ; Melena[ ] ; Hematochezia [ ] ; Heartburn[ ] ; Abdominal pain [ ] ; Constipation [ ] ; Diarrhea [ ] ; BRBPR [ ]    GU: Hematuria[ ] ; Dysuria [ ] ; Nocturia[ ]    Vascular: Pain in legs with walking [ ] ; Pain in feet with lying flat [ ] ; Non-healing sores [ ] ; Stroke [ ] ; TIA [ ] ; Slurred speech [ ] ;   Neuro: Headaches[ ] ; Vertigo[ ] ; Seizures[ ] ; Paresthesias[ ] ;Blurred vision [ ] ; Diplopia [ ] ; Vision changes [ ]    Ortho/Skin:  Arthritis [ ] ; Joint pain [ ] ; Muscle pain [ ] ; Joint swelling [ ] ; Back Pain [ ] ; Rash [ ]    Psych: Depression[ ] ; Anxiety[ ]    Heme: Bleeding problems [ ] ; Clotting disorders [ ] ; Anemia [ ]    Endocrine: Diabetes [ ] ; Thyroid dysfunction[ ]      Objective:    Vital Signs:   Temp:  [98.2 F (36.8 C)-98.4 F (36.9 C)] 98.4 F (36.9 C) (08/03 1632) Pulse Rate:  [122-132] 126 (08/03 2100) Resp:  [18-42] 42 (08/03 2100) BP: (136-175)/(98-116) 155/98 (08/03 2100) SpO2:  [89 %-98 %] 96 % (08/03 2100)    Weight change:  There were no vitals filed for this visit.  Intake/Output:  No intake or output data in the 24 hours ending 05/20/19 2202    Physical Exam    General:  Tachypneic HEENT: normal Neck: supple. JVP . Carotids 2+ bilat; no bruits. Cor: PMI nondisplaced. Tachycardiac. Lungs: clear anteriorly Abdomen: soft, nontender, nondistended. No hepatosplenomegaly.  No bruits or masses. Good bowel sounds. Extremities: no cyanosis, clubbing, rash, edema Neuro: alert & orientedx3, cranial nerves grossly intact. moves all 4 extremities w/o difficulty.  Affect pleasant   Labs   Basic Metabolic Panel: Recent Labs  Lab 05/20/19 1613  NA 139  K 3.2*  CL 104  CO2 20*  GLUCOSE 144*  BUN 11  CREATININE 0.67  CALCIUM 9.4    Liver Function Tests: No results for input(s): AST, ALT, ALKPHOS, BILITOT, PROT, ALBUMIN in the last 168 hours. No results for input(s): LIPASE, AMYLASE in the last 168 hours. No results for input(s): AMMONIA in the last 168 hours.  CBC: Recent Labs  Lab 05/20/19 1613  WBC 19.8*  HGB 12.4  HCT 36.2  MCV 72.1*  PLT 389    Cardiac Enzymes: No results for input(s): CKTOTAL, CKMB, CKMBINDEX, TROPONINI in the last 168 hours.  BNP: BNP (last 3 results) No results for input(s): BNP in the last 8760 hours.  ProBNP (last 3 results) No results for input(s): PROBNP in the last 8760 hours.   CBG: No results for input(s): GLUCAP in the last  168 hours.  Coagulation Studies: No results for input(s): LABPROT, INR in the last 72 hours.   Imaging   Chest Xray 2 View  Result Date: 05/20/2019 CLINICAL DATA:  Chest pain and shortness of breath for 3 days EXAM: CHEST - 2 VIEW COMPARISON:  Radiograph 01/01/2015, 01/17/2013 FINDINGS: Patchy areas of consolidation with few air bronchograms are present in right lower lobe and retrocardiac space. No pneumothorax or effusion. Cardiomediastinal contours are normal. No acute osseous or soft tissue abnormality. IMPRESSION: Findings could reflect multifocal pneumonia in the appropriate setting. Electronically Signed   By: Kreg ShropshirePrice  DeHay M.D.   On: 05/20/2019 17:37   Ct Chest W Contrast  Result Date: 05/20/2019 CLINICAL DATA:  Abnormal chest x-ray EXAM: CT CHEST WITH CONTRAST TECHNIQUE: Multidetector CT imaging of the chest was performed during intravenous contrast administration. CONTRAST:  75mL OMNIPAQUE IOHEXOL 300 MG/ML  SOLN COMPARISON:  Chest x-ray from earlier in the same day. FINDINGS: Cardiovascular: Thoracic aorta and its branches are within normal limits. No aneurysmal dilatation or dissection is seen. Moderate coronary calcifications are noted. No significant cardiac enlargement is noted. No large central pulmonary embolus is seen. Mediastinum/Nodes: Thoracic inlet is within normal limits. The esophagus as visualized is within normal limits. No hilar or mediastinal adenopathy is seen. Lungs/Pleura: The lungs are well aerated bilaterally. Patchy ground-glass infiltrates are identified bilaterally predominately within the bases. No associated effusion or pneumothorax is seen. Scattered calcified granulomas are noted. Upper Abdomen: The visualized upper abdomen is within normal limits. Musculoskeletal: No acute bony abnormality is seen. IMPRESSION: There are a spectrum of findings in the lungs which can be seen with acute atypical infection (as well as other non-infectious etiologies). In particular,  viral pneumonia (including COVID-19) should be considered in the appropriate clinical setting. Electronically Signed   By: Alcide CleverMark  Lukens M.D.   On: 05/20/2019 20:21   Vas [place] Lower Extremity Venous (r/o dvt)  Result Date: 05/20/2019 Lower Venous Study Other Indications: Patient states she had pain in leg several days ago, now  presenting with tachycardia and chest pain radiating to the left shoulder, SOB, and nausea.  Comparison Study: No prior study on file for comparison.  Performing Technologist: Sharion Dove RVS   Summary: Right: No evidence of common femoral vein obstruction. Left: There is no evidence of deep vein thrombosis in the lower extremity.     Medications:     Current Medications:   Infusions:  azithromycin     cefTRIAXone (ROCEPHIN)  IV         Assessment/Plan   1. Elevated troponin Patient presents to the hospital with complaints of chest pain and shortness of breath.  Her initial ECG revealed LVH with repolarization abnormalities.  She had sinus tachycardia with her heart rate in the 130s.  Her initial high-sensitivity troponin was elevated.  Her symptoms might be due to demand ischemia from an elevated heart rate and hypoxia due to a pulmonary process.  ACS is on the differential as well but less likely.  -Continue to cycle cardiac enzymes and obtain serial ECGs -Aspirin 81 mg daily -Consider unfractionated IV heparin given elevated troponin -High-dose statins -Check lipid panel -Transthoracic echocardiogram to evaluate LV function -Daily weights, strict I's and O's  We will continue to reassess as more data comes in.  Thank you for allowing [place] to participate in the care of this patient.   Meade Maw, MD  05/20/2019, 10:02 PM  Cardiology Overnight Team Please contact [facility] for night-coverage after hours (4p -7a ) and weekends on [email or web address]

## 2019-05-20 NOTE — Progress Notes (Addendum)
eLink Physician-Brief Progress Note Patient Name: Amber [name] DOB: [birth year] MRN: [id number]   Date of Service  05/20/2019  HPI/Events of Note  A 48 year old female with a history of hyperthyroidism, hypertension presented to the ED with chest pain for 3 days.  eICU Interventions  Respiratory 40 and heart rate 140.  WBC 19, troponin I52.  Worsening of CT chest.  COVID 19 pending.  Notified critical care nurse practitioner Amber [name] to evaluate the respiratory status of the patient and decide about need to come to ICU.      Intervention Category Major Interventions: Respiratory failure - evaluation and management Intermediate Interventions: Communication with other healthcare providers and/or family Evaluation Type: New Patient Evaluation  Mady Gemma 05/20/2019, 10:56 PM   3:01 AM Tachypnea and anxiety. COVID-19 test was negative. Already on azithromycin and ceftriaxone.  Ordered a stat ABG and PRN Haldol.  QTc 421.   3:37 AM Patient developed Acute hypoxic respiratory failure with oxygen saturation 70%. Reviewed ABG.  Recommended intubation and notified bedside ICU team. Consider 40 mg of IV Lasix. Troponin rised from 162 to 1330 last night, which was notified to cardiologist.  Patient is on heparin drip.   EKG last night has not showed any significant ST changes.  Will repeat EKG to compare.

## 2019-05-20 NOTE — ED Provider Notes (Addendum)
[facility] EMERGENCY DEPARTMENT Provider Note   CSN: [id number] Arrival date & time: 05/20/19  1600    History   Chief Complaint Chief Complaint  Patient presents with   Chest Pain    HPI Amber [name] is a 48 y.o. female.     The history is provided by the patient and medical records. No language interpreter was used.  Chest Pain Associated symptoms: palpitations and shortness of breath   Associated symptoms: no cough and no fever    Amber [name] is a 48 y.o. female  with a PMH of HTN, HLD who presents to the Emergency Department complaining of acute onset of central chest pain which began at 230 this afternoon.  Associated with palpitations and some shortness of breath.  Did radiate to her left arm.  Now, her pain is mostly left-sided and she still has some discomfort to her left chest wall. She did take a full aspirin when pain began. She does feel a little better. Still feeling palpitations.   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    Patient Active Problem List   Diagnosis Date Noted   Hyperthyroidism 05/20/2019   Elevated troponin 05/20/2019   Hypokalemia 05/20/2019   Acute respiratory failure with hypoxia ([place]) 05/20/2019   Unexplained weight loss 03/27/2019   Chronic right shoulder pain 11/28/2018   Prediabetes 12/05/2016   Encounter for annual general medical examination with abnormal findings in adult 12/04/2015   Hyperlipidemia 12/04/2015   Essential hypertension 08/20/2015    Past Surgical History:  Procedure Laterality Date   CESAREAN SECTION     x2   MASS EXCISION Right 01/18/2013   Procedure: EXCISION right scapular cyst;  Surgeon: Ralene Ok, MD;  Location: [facility];  Service: General;  Laterality: Right;     OB History   No obstetric history on file.      Home Medications    Prior to Admission medications   Medication Sig Start Date End Date  Taking? Authorizing Provider  atenolol (TENORMIN) 50 MG tablet Take 1 tablet (50 mg total) by mouth daily. 04/03/19  Yes Shamleffer, Melanie Crazier, MD  ferrous sulfate 325 (65 FE) MG tablet Take 325 mg by mouth every morning.    Yes [provider]  ibuprofen (ADVIL) 200 MG tablet Take 200 mg by mouth every 6 (six) hours as needed for moderate pain (leg pain).   Yes [provider]  methimazole (TAPAZOLE) 10 MG tablet Take 2 tablets (40mg  total) by mouth once daily. Patient taking differently: Take 20 mg by mouth daily.  05/03/19  Yes Elayne Snare, MD  amLODipine (NORVASC) 10 MG tablet Take 1 tablet (10 mg total) by mouth daily. For blood pressure. Patient not taking: Reported on 05/20/2019 11/28/18   [name], NP  naproxen (NAPROSYN) 500 MG tablet Take 1 tablet (500 mg total) by mouth 2 (two) times daily with a meal. Patient not taking: Reported on 05/20/2019 11/28/18   [name], NP  valsartan-hydrochlorothiazide (DIOVAN-HCT) 320-25 MG tablet Take 1 tablet by mouth daily. For blood pressure. Patient not taking: Reported on 05/20/2019 11/28/18   [name], NP    Family History Family History  Problem Relation Age of Onset   Diabetes Father    Diabetes Mother    Heart disease Mother    Diabetes Sister    Hypertension Sister     Social History Social History   Tobacco Use  Smoking status: Never Smoker   Smokeless tobacco: Never Used  Substance Use Topics   Alcohol use: Yes    Alcohol/week: 0.0 standard drinks    Comment: rare   Drug use: No     Allergies   Patient has no known allergies.   Review of Systems Review of Systems  Constitutional: Negative for fever.  HENT: Negative for congestion.   Respiratory: Positive for shortness of breath. Negative for cough.   Cardiovascular: Positive for chest pain and palpitations. Negative for leg swelling.  Musculoskeletal: Positive for myalgias (Left leg discomfort).  All other  systems reviewed and are negative.    Physical Exam Updated Vital Signs BP (!) 155/98    Pulse (!) 126    Temp 98.4 F (36.9 C) (Oral)    Resp (!) 42    LMP 06/19/2017 (Approximate)    SpO2 96%   Physical Exam Vitals signs and nursing note reviewed.  Constitutional:      General: She is not in acute distress.    Appearance: She is well-developed.  HENT:     Head: Normocephalic and atraumatic.  Neck:     Musculoskeletal: Neck supple.  Cardiovascular:     Rate and Rhythm: Regular rhythm. Tachycardia present.     Pulses: Normal pulses.     Heart sounds: Normal heart sounds. No murmur.  Pulmonary:     Effort: Pulmonary effort is normal. No respiratory distress.     Breath sounds: Normal breath sounds.  Abdominal:     General: There is no distension.     Palpations: Abdomen is soft.     Tenderness: There is no abdominal tenderness.  Musculoskeletal:        General: No swelling or tenderness.     Right lower leg: No edema.     Left lower leg: No edema.  Skin:    General: Skin is warm and dry.  Neurological:     Mental Status: She is alert and oriented to person, place, and time.      ED Treatments / Results  Labs (all labs ordered are listed, but only abnormal results are displayed) Labs Reviewed  BASIC METABOLIC PANEL - Abnormal; Notable for the following components:      Result Value   Potassium 3.2 (*)    CO2 20 (*)    Glucose, Bld 144 (*)    All other components within normal limits  CBC - Abnormal; Notable for the following components:   WBC 19.8 (*)    MCV 72.1 (*)    MCH 24.7 (*)    RDW 15.7 (*)    All other components within normal limits  TSH - Abnormal; Notable for the following components:   TSH <0.010 (*)    All other components within normal limits  T4, FREE - Abnormal; Notable for the following components:   Free T4 1.47 (*)    All other components within normal limits  TROPONIN I (HIGH SENSITIVITY) - Abnormal; Notable for the following  components:   Troponin I (High Sensitivity) 57 (*)    All other components within normal limits  TROPONIN I (HIGH SENSITIVITY) - Abnormal; Notable for the following components:   Troponin I (High Sensitivity) 162 (*)    All other components within normal limits  SARS CORONAVIRUS 2 (HOSPITAL ORDER, PERFORMED IN [facility])  RESPIRATORY PANEL BY PCR  CULTURE, BLOOD (ROUTINE X 2)  CULTURE, BLOOD (ROUTINE X 2)  MRSA PCR SCREENING  D-DIMER, QUANTITATIVE (NOT [facility])  T3, FREE  C-REACTIVE PROTEIN  FERRITIN  FIBRINOGEN  INTERLEUKIN-6, PLASMA  LACTATE DEHYDROGENASE  PROCALCITONIN  SEDIMENTATION RATE  BRAIN NATRIURETIC PEPTIDE  TRIGLYCERIDES  HEPATIC FUNCTION PANEL  CBC WITH DIFFERENTIAL/PLATELET  LACTIC ACID, PLASMA  MAGNESIUM  PHOSPHORUS  CK  CORTISOL  URINALYSIS, ROUTINE W REFLEX MICROSCOPIC  I-STAT BETA HCG BLOOD, ED ([facility], [facility], [facility] ONLY)  TROPONIN I (HIGH SENSITIVITY)    EKG EKG Interpretation  Date/Time:  Monday May 20 2019 17:56:45 EDT Ventricular Rate:  127 PR Interval:  144 QRS Duration: 78 QT Interval:  287 QTC Calculation: 418 R Axis:   28 Text Interpretation:  Sinus tachycardia LAE, consider biatrial enlargement Repol abnrm suggests ischemia, anterolateral aVR ST elevation is more pronounced Diffuse ST depression Confirmed by Derwood Kaplan [phone]) on 05/20/2019 6:18:19 PM   Radiology Dg Chest 2 View  Result Date: 05/20/2019 CLINICAL DATA:  Chest pain and shortness of breath for 3 days EXAM: CHEST - 2 VIEW COMPARISON:  Radiograph 01/01/2015, 01/17/2013 FINDINGS: Patchy areas of consolidation with few air bronchograms are present in right lower lobe and retrocardiac space. No pneumothorax or effusion. Cardiomediastinal contours are normal. No acute osseous or soft tissue abnormality. IMPRESSION: Findings could reflect multifocal pneumonia in the appropriate setting. Electronically Signed   By: Kreg Shropshire M.D.   On: 05/20/2019 17:37   Ct Chest W  Contrast  Result Date: 05/20/2019 CLINICAL DATA:  Abnormal chest x-ray EXAM: CT CHEST WITH CONTRAST TECHNIQUE: Multidetector CT imaging of the chest was performed during intravenous contrast administration. CONTRAST:  68mL OMNIPAQUE IOHEXOL 300 MG/ML  SOLN COMPARISON:  Chest x-ray from earlier in the same day. FINDINGS: Cardiovascular: Thoracic aorta and its branches are within normal limits. No aneurysmal dilatation or dissection is seen. Moderate coronary calcifications are noted. No significant cardiac enlargement is noted. No large central pulmonary embolus is seen. Mediastinum/Nodes: Thoracic inlet is within normal limits. The esophagus as visualized is within normal limits. No hilar or mediastinal adenopathy is seen. Lungs/Pleura: The lungs are well aerated bilaterally. Patchy ground-glass infiltrates are identified bilaterally predominately within the bases. No associated effusion or pneumothorax is seen. Scattered calcified granulomas are noted. Upper Abdomen: The visualized upper abdomen is within normal limits. Musculoskeletal: No acute bony abnormality is seen. IMPRESSION: There are a spectrum of findings in the lungs which can be seen with acute atypical infection (as well as other non-infectious etiologies). In particular, viral pneumonia (including COVID-19) should be considered in the appropriate clinical setting. Electronically Signed   By: Alcide Clever M.D.   On: 05/20/2019 20:21   Vas [place] Lower Extremity Venous (dvt) (only [facility])  Result Date: 05/20/2019  Lower Venous Study Other Indications: Patient states she had pain in leg several days ago, now                    presenting with tachycardia and chest pain radiating to the                    left shoulder, SOB, and nausea. Comparison Study: No prior study on file for comparison. Performing Technologist: Sherren Kerns RVS  Examination Guidelines: A complete evaluation includes B-mode imaging, spectral Doppler, color Doppler, and power  Doppler as needed of all accessible portions of each vessel. Bilateral testing is considered an integral part of a complete examination. Limited examinations for reoccurring indications may be performed as noted.  +-----+---------------+---------+-----------+----------+-------+  RIGHT Compressibility Phasicity Spontaneity Properties Summary  +-----+---------------+---------+-----------+----------+-------+  CFV   Full  Yes       Yes                             +-----+---------------+---------+-----------+----------+-------+   +---------+---------------+---------+-----------+----------+-------+  LEFT      Compressibility Phasicity Spontaneity Properties Summary  +---------+---------------+---------+-----------+----------+-------+  CFV       Full            Yes       Yes                             +---------+---------------+---------+-----------+----------+-------+  SFJ       Full                                                      +---------+---------------+---------+-----------+----------+-------+  FV Prox   Full                                                      +---------+---------------+---------+-----------+----------+-------+  FV Mid    Full                                                      +---------+---------------+---------+-----------+----------+-------+  FV Distal Full                                                      +---------+---------------+---------+-----------+----------+-------+  PFV       Full                                                      +---------+---------------+---------+-----------+----------+-------+  POP       Full            Yes       Yes                             +---------+---------------+---------+-----------+----------+-------+  PTV       Full                                                      +---------+---------------+---------+-----------+----------+-------+  PERO      Full                                                       +---------+---------------+---------+-----------+----------+-------+  Summary: Right: No evidence of common femoral vein obstruction. Left: There is no evidence of deep vein thrombosis in the lower extremity.  *See table(s) above for measurements and observations.    Preliminary     Procedures Procedures (including critical care time)  Medications Ordered in ED Medications  cefTRIAXone (ROCEPHIN) 2 g in sodium chloride 0.9 % 100 mL IVPB (has no administration in time range)  azithromycin (ZITHROMAX) 500 mg in sodium chloride 0.9 % 250 mL IVPB (has no administration in time range)  sodium chloride 0.9 % bolus 1,000 mL (1,000 mLs Intravenous New Bag/Given 05/20/19 1750)  fentaNYL (SUBLIMAZE) injection 50 mcg (50 mcg Intravenous Given 05/20/19 1749)  iohexol (OMNIPAQUE) 300 MG/ML solution 75 mL (75 mLs Intravenous Contrast Given 05/20/19 2010)     Initial Impression / Assessment and Plan / ED Course  I have reviewed the triage vital signs and the nursing notes.  Pertinent labs & imaging results that were available during my care of the patient were reviewed by me and considered in my medical decision making (see chart for details).       Floria Ravelingntonia Granada is a 48 y.o. female who presents to ED for acute onset of central chest pain radiating to her left arm associated with shortness of breath. Tachycardic on initial exam. Initial troponin of 57 with repeat at 2 hour mark of 162. EKG with st changes. She did take full dose aspirin prior to ED arrival. ? Myocarditis vs. ACS. Consulted cardiology who evaluated patient. See consultation note for full recommendations. Recommends medical admission with cards consultation. She has history of hyperthyroidism.  TSH low and free T4 elevated.  Doubt PE as she has negative d-dimer and negative lower extremity ultrasound.  Initial chest x-ray show possible multifocal pneumonia, therefore CT of the chest was obtained to further evaluate.  CT shows a spectrum of  findings consistent with atypical infection.  Patient has had intermittent episodes of shortness of breath where her oxygen sats dropped to 86 to 89% on room air.  Discussed with hospitalist who will admit.  COVID pending.  Patient seen by and discussed with Dr. Rhunette CroftNanavati who agrees with treatment plan.   Final Clinical Impressions(s) / ED Diagnoses   Final diagnoses:  Chest pain, unspecified type  Elevated troponin  Hypokalemia  Shortness of breath    ED Discharge Orders    None         Alayne Estrella, Chase PicketJaime Pilcher, PA-C 05/20/19 2314    Derwood KaplanNanavati, Ankit, MD 05/21/19 2007

## 2019-05-20 NOTE — H&P (Signed)
NAME:  Amber [name], MRN:  [id number], DOB:  05/03/1971, LOS: 0 ADMISSION DATE:  05/20/2019, CONSULTATION DATE:  8/3 REFERRING MD:  Dr. Adela Glimpseoutova, CHIEF COMPLAINT:  Respiratory distress  Brief History   48 year old female presented with CP x 3 days. Typical chest pain by description. Troponin continued to increase in ED. Developed respiratory distress. PCCM asked to see.   History of present illness   48 year old female with PMH as below, which is significant for hyperthyroid on methimazole. She is a Designer, [employer] employee. [facility] with complaints of chest pain x 3 days. Chest pain is accompanied by L arm radiation, palpitations, and SOB. Upon arrival to ED she was tachycardiac with initial troponin of 57 with ST changes on EKG. Troponin increased to 162. Cardiology was consulted who felt this was less likely to be ACS. Her dyspnea began to worsen and she was taken for CXR, CT chest. CXR demonstrates central congestion and fluid in fissure. CT chest demonstrated bilateral GGO. O2 escalated to 4L. PCCM asked to evaluate.   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 hyperthyroid.  Significant Hospital Events   8/3 admit  Consults:  Cardiology  Procedures:    Significant Diagnostic Tests:  CT chest 8/3 > The lungs are well aerated bilaterally. Patchy ground-glass infiltrates are identified bilaterally predominately within the bases.  Micro Data:  COVID-19 8/3 > Neg COVID-19 8/3 >  Antimicrobials:  Ceftriaxone 8/3 > Azithromycin 8/3 >  Interim history/subjective:    Objective   Blood pressure (!) 133/98, pulse (!) 150, temperature 98.4 F (36.9 C), temperature source Oral, resp. rate (!) 41, last menstrual period 06/19/2017, SpO2 95 %.       No intake or output data in the 24 hours ending 05/20/19 2331 There were no vitals filed for this visit.  Examination: General: obese middle aged  female HENT: Golden/AT, PERRL, unable to appreciabte JVD Lungs: bibasilar crackles. Mild resp distress.  Cardiovascular: Tachy, regular, no MRG Abdomen: Soft, non-tender, non-distended Extremities: warm, no edema.  Neuro: Alert, oriented, non-focal.   Resolved Hospital Problem list     Assessment & Plan:   NSTEMI: Non-specific ST changes on initial EKG. Troponin up-trending from 57 >1330. Chest pain is described as typical in nature.  - Cardiology re-called by RN in ED - Start heparin infusion at ACS dosing.  - Continue ASA.  - Continue to trend troponin - Repeat EKG in AM  Acute hypoxemic respiratory failure. Based on history and imaging this is felt to represent pulmonary edema. Infectious etiology not ruled out.  - Empiric CAP coverage.  - COVID test repeated by hospitalist - Needs further cardiology evaluation - Consider diuresis assuming no cardiac intervention warranted.  - PRN BiPAP  Hyperthyroidism: TSH undetectably low on admission, but free T4 just mildly elevated.  - Continue home methimazole   Hypertension:  - holding home antihypertensives for now with BP slowly decreasing.   Hypokalemia: - Replace K with goal > 4 - Replace mag with goal > 2  Best practice:  Diet: NPO except meds Pain/Anxiety/Delirium protocol (if indicated): NA VAP protocol (if indicated): NA DVT prophylaxis: heparin infusion GI prophylaxis: PPI Glucose control: NA Mobility: BR Code Status: FULL Family Communication: Patient Disposition: ICU  Labs   CBC: Recent Labs  Lab 05/20/19 1613  WBC 19.8*  HGB 12.4  HCT 36.2  MCV 72.1*  PLT 389  Basic Metabolic Panel: Recent Labs  Lab 05/20/19 1613 05/20/19 2115  NA 139  --   K 3.2*  --   CL 104  --   CO2 20*  --   GLUCOSE 144*  --   BUN 11  --   CREATININE 0.67  --   CALCIUM 9.4  --   MG  --  1.7  PHOS  --  4.0   GFR: CrCl cannot be calculated (Unknown ideal weight.). Recent Labs  Lab 05/20/19 1613  WBC 19.8*     Liver Function Tests: Recent Labs  Lab 05/20/19 2115  AST 35  ALT 15  ALKPHOS 114  BILITOT 0.4  PROT 7.1  ALBUMIN 3.3*   No results for input(s): LIPASE, AMYLASE in the last 168 hours. No results for input(s): AMMONIA in the last 168 hours.  ABG No results found for: PHART, PCO2ART, PO2ART, HCO3, TCO2, ACIDBASEDEF, O2SAT   Coagulation Profile: No results for input(s): INR, PROTIME in the last 168 hours.  Cardiac Enzymes: Recent Labs  Lab 05/20/19 2115  CKTOTAL 324*    HbA1C: Hgb A1c MFr Bld  Date/Time Value Ref Range Status  03/28/2019 08:51 AM 5.4 4.6 - 6.5 % Final    Comment:    Glycemic Control Guidelines for People with Diabetes:Non Diabetic:  <6%Goal of Therapy: <7%Additional Action Suggested:  >8%   11/21/2018 09:11 AM 5.5 4.6 - 6.5 % Final    Comment:    Glycemic Control Guidelines for People with Diabetes:Non Diabetic:  <6%Goal of Therapy: <7%Additional Action Suggested:  >8%     CBG: No results for input(s): GLUCAP in the last 168 hours.  Review of Systems:   Bolds are positive  Constitutional: weight loss, gain, night sweats, Fevers, chills, fatigue .  HEENT: headaches, Sore throat, sneezing, nasal congestion, post nasal drip, Difficulty swallowing, Tooth/dental problems, visual complaints visual changes, ear ache CV:  chest pain, radiates: to left arm,Orthopnea, PND, swelling in lower extremities, dizziness, palpitations, syncope.  GI  heartburn, indigestion, abdominal pain, nausea, vomiting, diarrhea, change in bowel habits, loss of appetite, bloody stools.  Resp: cough, productive: but only productive for the past couple minutes. Clear. , hemoptysis, dyspnea, chest pain, pleuritic.  Skin: rash or itching or icterus GU: dysuria, change in color of urine, urgency or frequency. flank pain, hematuria  MS: joint pain or swelling. decreased range of motion  Psych: change in mood or affect. depression or anxiety.  Neuro: difficulty with speech, weakness,  numbness, ataxia    Past Medical History  She,  has a past medical history of HTN (hypertension) and Positive tuberculin test (12/26/14).   Surgical History    Past Surgical History:  Procedure Laterality Date  . CESAREAN SECTION     x2  . MASS EXCISION Right 01/18/2013   Procedure: EXCISION right scapular cyst;  Surgeon: Ralene Ok, MD;  Location: [facility];  Service: General;  Laterality: Right;     Social History   reports that she has never smoked. She has never used smokeless tobacco. She reports current alcohol use. She reports that she does not use drugs.   Family History   Her family history includes Diabetes in her father, mother, and sister; Heart disease in her mother; Hypertension in her sister.   Allergies No Known Allergies   Home Medications  Prior to Admission medications   Medication Sig Start Date End Date Taking? Authorizing Provider  atenolol (TENORMIN) 50 MG tablet Take 1 tablet (50 mg total) by mouth daily. 04/03/19  Yes Shamleffer, Konrad Dolores, MD  ferrous sulfate 325 (65 FE) MG tablet Take 325 mg by mouth every morning.    Yes [provider]  ibuprofen (ADVIL) 200 MG tablet Take 200 mg by mouth every 6 (six) hours as needed for moderate pain (leg pain).   Yes [provider]  methimazole (TAPAZOLE) 10 MG tablet Take 2 tablets (40mg  total) by mouth once daily. Patient taking differently: Take 20 mg by mouth daily.  05/03/19  Yes Reather Littler, MD  amLODipine (NORVASC) 10 MG tablet Take 1 tablet (10 mg total) by mouth daily. For blood pressure. Patient not taking: Reported on 05/20/2019 11/28/18   Doreene Nest, NP  naproxen (NAPROSYN) 500 MG tablet Take 1 tablet (500 mg total) by mouth 2 (two) times daily with a meal. Patient not taking: Reported on 05/20/2019 11/28/18   Doreene Nest, NP  valsartan-hydrochlorothiazide (DIOVAN-HCT) 320-25 MG tablet Take 1 tablet by mouth daily. For blood pressure. Patient not taking: Reported on  05/20/2019 11/28/18   Doreene Nest, NP     Critical care time: 35 mins     Joneen Roach, AGACNP-BC [facility] Pulmonary/Critical Care Pager [phone] or [phone]  05/21/2019 12:13 AM

## 2019-05-20 NOTE — Progress Notes (Signed)
ANTICOAGULATION CONSULT NOTE - Initial Consult  Pharmacy Consult for Heparin  Indication: chest pain/ACS  No Known Allergies   Vital Signs: Temp: 98.4 F (36.9 C) (08/03 1632) Temp Source: Oral (08/03 1632) BP: 133/98 (08/03 2300) Pulse Rate: 150 (08/03 2300)  Labs: Recent Labs    05/20/19 1613 05/20/19 1746 05/20/19 2115  HGB 12.4  --   --   HCT 36.2  --   --   PLT 389  --   --   CREATININE 0.67  --   --   CKTOTAL  --   --  324*  TROPONINIHS 57* 162* 1,330*    CrCl cannot be calculated (Unknown ideal weight.).   Medical History: Past Medical History:  Diagnosis Date  . HTN (hypertension)   . 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  Goal of Therapy:  Heparin level 0.3-0.7 units/ml Monitor platelets by anticoagulation protocol: Yes   Plan:  Heparin 4000 units BOLUS Start heparin drip at 900 units/hr 0800 HL Daily CBC/HL Monitor for bleeding   Narda Bonds 05/20/2019,11:40 PM

## 2019-05-20 NOTE — ED Notes (Signed)
Pt given 324 ASA per admitting.

## 2019-05-20 NOTE — ED Triage Notes (Addendum)
Pt endorses central chest pain radiating to the left shoulder with shob and nausea. Denies cardiac hx other than htn. Tachy and hypertensive

## 2019-05-20 NOTE — Progress Notes (Signed)
VASCULAR LAB PRELIMINARY  PRELIMINARY  PRELIMINARY  PRELIMINARY  Left lower extremity venous duplex completed.    Preliminary report:  See CV proc for preliminary results.    Gave report to [facility], PA-C  Kailani Brass, RVT 05/20/2019, 8:20 PM

## 2019-05-20 NOTE — ED Notes (Signed)
Pt's O2 sat 89-90% on room air. Pt placed on 3 L [place], sats increased to 94%

## 2019-05-20 NOTE — Telephone Encounter (Signed)
Left a message to call back    Abby Jaralla Shamleffer, MD  [facility]  [facility] [address] [place], [place] [address] Phone: [phone] FAX: [phone]  

## 2019-05-21 ENCOUNTER — Encounter (HOSPITAL_COMMUNITY): Payer: Self-pay | Admitting: *Deleted

## 2019-05-21 ENCOUNTER — Inpatient Hospital Stay (HOSPITAL_COMMUNITY): Payer: Commercial Managed Care - PPO

## 2019-05-21 DIAGNOSIS — I361 Nonrheumatic tricuspid (valve) insufficiency: Secondary | ICD-10-CM

## 2019-05-21 DIAGNOSIS — E0590 Thyrotoxicosis, unspecified without thyrotoxic crisis or storm: Secondary | ICD-10-CM

## 2019-05-21 DIAGNOSIS — R0902 Hypoxemia: Secondary | ICD-10-CM

## 2019-05-21 DIAGNOSIS — R7989 Other specified abnormal findings of blood chemistry: Secondary | ICD-10-CM

## 2019-05-21 DIAGNOSIS — I371 Nonrheumatic pulmonary valve insufficiency: Secondary | ICD-10-CM

## 2019-05-21 DIAGNOSIS — J810 Acute pulmonary edema: Secondary | ICD-10-CM | POA: Insufficient documentation

## 2019-05-21 LAB — HEPARIN LEVEL (UNFRACTIONATED)
Heparin Unfractionated: 0.11 IU/mL — ABNORMAL LOW (ref 0.30–0.70)
Heparin Unfractionated: 0.13 IU/mL — ABNORMAL LOW (ref 0.30–0.70)

## 2019-05-21 LAB — PROTIME-INR
INR: 1.8 — ABNORMAL HIGH (ref 0.8–1.2)
INR: 1.1 (ref 0.8–1.2)
Prothrombin Time: 20.5 seconds — ABNORMAL HIGH (ref 11.4–15.2)
Prothrombin Time: 14.5 seconds (ref 11.4–15.2)

## 2019-05-21 LAB — TROPONIN I (HIGH SENSITIVITY)
Troponin I (High Sensitivity): 1357 ng/L (ref <18)
Troponin I (High Sensitivity): 6121 ng/L (ref <18)
Troponin I (High Sensitivity): 9688 ng/L (ref <18)

## 2019-05-21 LAB — CBC WITH DIFFERENTIAL/PLATELET
Abs Immature Granulocytes: 0.07 10*3/uL (ref 0.00–0.07)
Basophils Absolute: 0 10*3/uL (ref 0.0–0.1)
Basophils Relative: 0 %
Eosinophils Absolute: 0 10*3/uL (ref 0.0–0.5)
Eosinophils Relative: 0 %
HCT: 24.2 % — ABNORMAL LOW (ref 36.0–46.0)
Hemoglobin: 8.4 g/dL — ABNORMAL LOW (ref 12.0–15.0)
Immature Granulocytes: 0 %
Lymphocytes Relative: 15 %
Lymphs Abs: 2.8 10*3/uL (ref 0.7–4.0)
MCH: 25.3 pg — ABNORMAL LOW (ref 26.0–34.0)
MCHC: 34.7 g/dL (ref 30.0–36.0)
MCV: 72.9 fL — ABNORMAL LOW (ref 80.0–100.0)
Monocytes Absolute: 0.9 10*3/uL (ref 0.1–1.0)
Monocytes Relative: 5 %
Neutro Abs: 14.7 10*3/uL — ABNORMAL HIGH (ref 1.7–7.7)
Neutrophils Relative %: 80 %
Platelets: 247 10*3/uL (ref 150–400)
RBC: 3.32 MIL/uL — ABNORMAL LOW (ref 3.87–5.11)
RDW: 15.4 % (ref 11.5–15.5)
WBC: 18.5 10*3/uL — ABNORMAL HIGH (ref 4.0–10.5)
nRBC: 0 % (ref 0.0–0.2)

## 2019-05-21 LAB — HIV ANTIBODY (ROUTINE TESTING W REFLEX): HIV Screen 4th Generation wRfx: NONREACTIVE

## 2019-05-21 LAB — ECHOCARDIOGRAM COMPLETE
Height: 68 in
Weight: 2811.31 oz

## 2019-05-21 LAB — POCT I-STAT 7, (LYTES, BLD GAS, ICA,H+H)
Acid-base deficit: 6 mmol/L — ABNORMAL HIGH (ref 0.0–2.0)
Acid-base deficit: 7 mmol/L — ABNORMAL HIGH (ref 0.0–2.0)
Bicarbonate: 20.7 mmol/L (ref 20.0–28.0)
Bicarbonate: 18.8 mmol/L — ABNORMAL LOW (ref 20.0–28.0)
Calcium, Ion: 1.31 mmol/L (ref 1.15–1.40)
Calcium, Ion: 1.2 mmol/L (ref 1.15–1.40)
HCT: 39 % (ref 36.0–46.0)
HCT: 39 % (ref 36.0–46.0)
Hemoglobin: 13.3 g/dL (ref 12.0–15.0)
Hemoglobin: 13.3 g/dL (ref 12.0–15.0)
O2 Saturation: 67 %
O2 Saturation: 90 %
Patient temperature: 99.5
Potassium: 4.4 mmol/L (ref 3.5–5.1)
Potassium: 3.6 mmol/L (ref 3.5–5.1)
Sodium: 141 mmol/L (ref 135–145)
Sodium: 140 mmol/L (ref 135–145)
TCO2: 22 mmol/L (ref 22–32)
TCO2: 20 mmol/L — ABNORMAL LOW (ref 22–32)
pCO2 arterial: 43.2 mmHg (ref 32.0–48.0)
pCO2 arterial: 39.3 mmHg (ref 32.0–48.0)
pH, Arterial: 7.29 — ABNORMAL LOW (ref 7.350–7.450)
pH, Arterial: 7.288 — ABNORMAL LOW (ref 7.350–7.450)
pO2, Arterial: 40 mmHg — CL (ref 83.0–108.0)
pO2, Arterial: 64 mmHg — ABNORMAL LOW (ref 83.0–108.0)

## 2019-05-21 LAB — BASIC METABOLIC PANEL
Anion gap: 16 — ABNORMAL HIGH (ref 5–15)
BUN: 15 mg/dL (ref 6–20)
CO2: 18 mmol/L — ABNORMAL LOW (ref 22–32)
Calcium: 8.9 mg/dL (ref 8.9–10.3)
Chloride: 106 mmol/L (ref 98–111)
Creatinine, Ser: 0.62 mg/dL (ref 0.44–1.00)
GFR calc Af Amer: >60 mL/min (ref >60)
GFR calc non Af Amer: >60 mL/min (ref >60)
Glucose, Bld: 139 mg/dL — ABNORMAL HIGH (ref 70–99)
Potassium: 4.1 mmol/L (ref 3.5–5.1)
Sodium: 140 mmol/L (ref 135–145)

## 2019-05-21 LAB — APTT: aPTT: 74 seconds — ABNORMAL HIGH (ref 24–36)

## 2019-05-21 LAB — C-REACTIVE PROTEIN: CRP: <0.8 mg/dL (ref <1.0)

## 2019-05-21 LAB — CBC
HCT: 35.6 % — ABNORMAL LOW (ref 36.0–46.0)
Hemoglobin: 12.4 g/dL (ref 12.0–15.0)
MCH: 24.7 pg — ABNORMAL LOW (ref 26.0–34.0)
MCHC: 34.8 g/dL (ref 30.0–36.0)
MCV: 70.8 fL — ABNORMAL LOW (ref 80.0–100.0)
Platelets: 365 10*3/uL (ref 150–400)
RBC: 5.03 MIL/uL (ref 3.87–5.11)
RDW: 15.5 % (ref 11.5–15.5)
WBC: 33.4 10*3/uL — ABNORMAL HIGH (ref 4.0–10.5)
nRBC: 0 % (ref 0.0–0.2)

## 2019-05-21 LAB — TRIGLYCERIDES: Triglycerides: 69 mg/dL (ref <150)

## 2019-05-21 LAB — LACTIC ACID, PLASMA
Lactic Acid, Venous: 0.9 mmol/L (ref 0.5–1.9)
Lactic Acid, Venous: 4.8 mmol/L (ref 0.5–1.9)
Lactic Acid, Venous: 2.2 mmol/L (ref 0.5–1.9)

## 2019-05-21 LAB — T3, FREE: T3, Free: 2.1 pg/mL (ref 2.0–4.4)

## 2019-05-21 LAB — FERRITIN: Ferritin: 44 ng/mL (ref 11–307)

## 2019-05-21 LAB — CORTISOL: Cortisol, Plasma: 9.9 ug/dL

## 2019-05-21 LAB — MRSA PCR SCREENING: MRSA by PCR: NEGATIVE

## 2019-05-21 LAB — SEDIMENTATION RATE: Sed Rate: 9 mm/hr (ref 0–22)

## 2019-05-21 MED ORDER — NOREPINEPHRINE 4 MG/250ML-% IV SOLN
0.0000 ug/min | INTRAVENOUS | Status: DC
  Filled 2019-05-21: qty 250

## 2019-05-21 MED ORDER — ASPIRIN 81 MG PO CHEW: 81.0000 mg | CHEWABLE_TABLET | ORAL | Status: DC

## 2019-05-21 MED ORDER — PANTOPRAZOLE SODIUM 40 MG IV SOLR
40.0000 mg | Freq: Every day | INTRAVENOUS | Status: DC
  Administered 2019-05-21: 09:00:00 40 mg via INTRAVENOUS
  Filled 2019-05-21: qty 40

## 2019-05-21 MED ORDER — FENTANYL CITRATE (PF) 100 MCG/2ML IJ SOLN: 100.0000 ug | Freq: Once | INTRAMUSCULAR | Status: AC

## 2019-05-21 MED ORDER — SODIUM CHLORIDE 0.9% FLUSH: 3.0000 mL | INTRAVENOUS | Status: DC | PRN

## 2019-05-21 MED ORDER — HALOPERIDOL LACTATE 5 MG/ML IJ SOLN
2.0000 mg | INTRAMUSCULAR | Status: DC | PRN
  Administered 2019-05-21: 03:00:00 2 mg via INTRAVENOUS
  Filled 2019-05-21: qty 1

## 2019-05-21 MED ORDER — FUROSEMIDE 10 MG/ML IJ SOLN
INTRAMUSCULAR | Status: AC
  Filled 2019-05-21: qty 8

## 2019-05-21 MED ORDER — FENTANYL CITRATE (PF) 100 MCG/2ML IJ SOLN
INTRAMUSCULAR | Status: AC
  Administered 2019-05-21: 04:00:00
  Filled 2019-05-21: qty 2

## 2019-05-21 MED ORDER — ETOMIDATE 2 MG/ML IV SOLN
20.0000 mg | Freq: Once | INTRAVENOUS | Status: AC
  Administered 2019-05-21: 04:00:00 20 mg via INTRAVENOUS

## 2019-05-21 MED ORDER — METOPROLOL TARTRATE 5 MG/5ML IV SOLN
5.0000 mg | INTRAVENOUS | Status: AC
  Administered 2019-05-21: 03:00:00 5 mg via INTRAVENOUS

## 2019-05-21 MED ORDER — HYDROCORTISONE NA SUCCINATE PF 100 MG IJ SOLR
100.0000 mg | Freq: Three times a day (TID) | INTRAMUSCULAR | Status: DC
  Administered 2019-05-21 – 2019-05-23 (×7): 100 mg via INTRAVENOUS
  Filled 2019-05-21 (×7): qty 2

## 2019-05-21 MED ORDER — ROCURONIUM BROMIDE 50 MG/5ML IV SOLN
50.0000 mg | Freq: Once | INTRAVENOUS | Status: AC
  Administered 2019-05-21: 50 mg via INTRAVENOUS
  Filled 2019-05-21: qty 5

## 2019-05-21 MED ORDER — METOPROLOL TARTRATE 5 MG/5ML IV SOLN: 2.5000 mg | Freq: Once | INTRAVENOUS | Status: DC

## 2019-05-21 MED ORDER — FENTANYL CITRATE (PF) 100 MCG/2ML IJ SOLN: 50.0000 ug | Freq: Once | INTRAMUSCULAR | Status: DC

## 2019-05-21 MED ORDER — SODIUM CHLORIDE 0.9% FLUSH
3.0000 mL | Freq: Two times a day (BID) | INTRAVENOUS | Status: DC
  Administered 2019-05-21 – 2019-05-25 (×6): 3 mL via INTRAVENOUS

## 2019-05-21 MED ORDER — CHLORHEXIDINE GLUCONATE CLOTH 2 % EX PADS
6.0000 | MEDICATED_PAD | Freq: Every day | CUTANEOUS | Status: DC
  Administered 2019-05-21 – 2019-05-24 (×2): 6 via TOPICAL

## 2019-05-21 MED ORDER — LORAZEPAM 2 MG/ML IJ SOLN: 0.5000 mg | INTRAMUSCULAR | Status: DC | PRN

## 2019-05-21 MED ORDER — FUROSEMIDE 10 MG/ML IJ SOLN
80.0000 mg | INTRAMUSCULAR | Status: AC
  Administered 2019-05-21: 04:00:00 80 mg via INTRAVENOUS

## 2019-05-21 MED ORDER — POTASSIUM CHLORIDE CRYS ER 20 MEQ PO TBCR
40.0000 meq | EXTENDED_RELEASE_TABLET | Freq: Once | ORAL | Status: AC
  Administered 2019-05-21: 02:00:00 40 meq via ORAL
  Filled 2019-05-21: qty 2

## 2019-05-21 MED ORDER — SODIUM CHLORIDE 0.9 % IV SOLN: INTRAVENOUS | Status: DC

## 2019-05-21 MED ORDER — PERFLUTREN LIPID MICROSPHERE
1.0000 mL | INTRAVENOUS | Status: AC | PRN
  Administered 2019-05-21: 09:00:00 2 mL via INTRAVENOUS
  Filled 2019-05-21: qty 10

## 2019-05-21 MED ORDER — METOPROLOL TARTRATE 5 MG/5ML IV SOLN
2.5000 mg | INTRAVENOUS | Status: DC
  Administered 2019-05-21 – 2019-05-22 (×5): 2.5 mg via INTRAVENOUS
  Filled 2019-05-21 (×6): qty 5

## 2019-05-21 MED ORDER — HEPARIN BOLUS VIA INFUSION
2000.0000 [IU] | Freq: Once | INTRAVENOUS | Status: AC
  Administered 2019-05-21: 21:00:00 2000 [IU] via INTRAVENOUS
  Filled 2019-05-21: qty 2000

## 2019-05-21 MED ORDER — MIDAZOLAM HCL 2 MG/2ML IJ SOLN
INTRAMUSCULAR | Status: AC
  Administered 2019-05-21: 03:00:00 2 mg via INTRAVENOUS
  Filled 2019-05-21: qty 2

## 2019-05-21 MED ORDER — METOPROLOL TARTRATE 5 MG/5ML IV SOLN
INTRAVENOUS | Status: AC
  Filled 2019-05-21: qty 5

## 2019-05-21 MED ORDER — FENTANYL BOLUS VIA INFUSION
50.0000 ug | INTRAVENOUS | Status: DC | PRN
  Administered 2019-05-21: 25 ug via INTRAVENOUS
  Filled 2019-05-21: qty 50

## 2019-05-21 MED ORDER — METOPROLOL TARTRATE 5 MG/5ML IV SOLN
5.0000 mg | INTRAVENOUS | Status: DC
  Administered 2019-05-21: 10:00:00 3 mg via INTRAVENOUS
  Filled 2019-05-21: qty 5

## 2019-05-21 MED ORDER — PROPOFOL 1000 MG/100ML IV EMUL
5.0000 ug/kg/min | INTRAVENOUS | Status: DC
  Administered 2019-05-21: 06:00:00 20 ug/kg/min via INTRAVENOUS

## 2019-05-21 MED ORDER — MIDAZOLAM HCL 2 MG/2ML IJ SOLN
2.0000 mg | Freq: Once | INTRAMUSCULAR | Status: AC
  Administered 2019-05-21: 2 mg via INTRAVENOUS

## 2019-05-21 MED ORDER — PERFLUTREN LIPID MICROSPHERE
INTRAVENOUS | Status: AC
  Filled 2019-05-21: qty 10

## 2019-05-21 MED ORDER — MAGNESIUM SULFATE 2 GM/50ML IV SOLN
2.0000 g | Freq: Once | INTRAVENOUS | Status: AC
  Administered 2019-05-21: 09:00:00 2 g via INTRAVENOUS
  Filled 2019-05-21: qty 50

## 2019-05-21 MED ORDER — SODIUM CHLORIDE 0.9 % IV SOLN: 250.0000 mL | INTRAVENOUS | Status: DC | PRN

## 2019-05-21 MED ORDER — ORAL CARE MOUTH RINSE
15.0000 mL | OROMUCOSAL | Status: DC
  Administered 2019-05-21 – 2019-05-22 (×12): 15 mL via OROMUCOSAL

## 2019-05-21 MED ORDER — NITROGLYCERIN IN D5W 200-5 MCG/ML-% IV SOLN
INTRAVENOUS | Status: AC
  Filled 2019-05-21: qty 250

## 2019-05-21 MED ORDER — CHLORHEXIDINE GLUCONATE 0.12% ORAL RINSE (MEDLINE KIT)
15.0000 mL | Freq: Two times a day (BID) | OROMUCOSAL | Status: DC
  Administered 2019-05-21 – 2019-05-22 (×3): 15 mL via OROMUCOSAL

## 2019-05-21 MED ORDER — MIDAZOLAM HCL 2 MG/2ML IJ SOLN
2.0000 mg | INTRAMUSCULAR | Status: DC | PRN
  Administered 2019-05-21: 2 mg via INTRAVENOUS
  Filled 2019-05-21: qty 2

## 2019-05-21 MED ORDER — NITROGLYCERIN IN D5W 200-5 MCG/ML-% IV SOLN
2.0000 ug/min | INTRAVENOUS | Status: DC
  Administered 2019-05-21: 02:00:00 5 ug/min via INTRAVENOUS

## 2019-05-21 MED ORDER — ASPIRIN 81 MG PO CHEW
81.0000 mg | CHEWABLE_TABLET | ORAL | Status: AC
  Administered 2019-05-21: 11:00:00 81 mg via ORAL
  Filled 2019-05-21: qty 1

## 2019-05-21 MED ORDER — MIDAZOLAM HCL 2 MG/2ML IJ SOLN: 2.0000 mg | INTRAMUSCULAR | Status: DC | PRN

## 2019-05-21 MED ORDER — FENTANYL 2500MCG IN NS 250ML (10MCG/ML) PREMIX INFUSION
50.0000 ug/h | INTRAVENOUS | Status: DC
  Administered 2019-05-21: 04:00:00 50 ug/h via INTRAVENOUS
  Administered 2019-05-22: 03:00:00 75 ug/h via INTRAVENOUS
  Filled 2019-05-21 (×2): qty 250

## 2019-05-21 MED ORDER — PROPYLTHIOURACIL 50 MG PO TABS
200.0000 mg | ORAL_TABLET | ORAL | Status: DC
  Administered 2019-05-21 – 2019-05-23 (×12): 200 mg via ORAL
  Filled 2019-05-21 (×14): qty 4

## 2019-05-21 MED ORDER — FUROSEMIDE 10 MG/ML IJ SOLN: 40.0000 mg | Freq: Once | INTRAMUSCULAR | Status: DC

## 2019-05-21 NOTE — Progress Notes (Signed)
CRITICAL VALUE ALERT  Critical Value:  Lactic Acid 2.2  Date & Time Notied:  05/21/19  Provider Notified: Nelda Marseille, MD   Orders Received/Actions taken: No new order  Kathleene Hazel, RN

## 2019-05-21 NOTE — Progress Notes (Signed)
   Patient scheduled for right/left heart catheterization later today. Patient is currently intubated. Discussed procedure with son, Azucena Kuba, who gave consent. Son speaks Spanish so used Technical brewer Magda Paganini [phone]).  Son understands that risks include but are not limited to stroke (1 in 9), death (1 in 16), kidney failure [usually temporary] (1 in 500), bleeding (1 in 200), allergic reaction [possibly serious] (1 in 200), and agrees to proceed.   Of note, hemoglobin dropped from 12.4 yesterday to 8.4 early this morning. Checking repeat CBC to confirm.  Darreld Mclean, PA-C 05/21/2019 11:27 AM Pager: [phone]

## 2019-05-21 NOTE — Progress Notes (Addendum)
NAME:  Amber [name], MRN:  [id number], DOB:  Mar 31, 1971, LOS: 1 ADMISSION DATE:  05/20/2019, CONSULTATION DATE:  8/3 REFERRING MD:  Dr. Adela Glimpse, CHIEF COMPLAINT:  Respiratory distress  Brief History   48 year old female presented with CP x 3 days. Typical chest pain by description. Troponin continued to increase in ED. Developed respiratory distress. PCCM asked to see.  Required intubation early AM 8/4.  History of present illness   48 year old female with PMH as below, which is significant for hyperthyroid on methimazole. She is a Designer, television/film set. She presented to [facility] ED on 8/3 with complaints of chest pain x 3 days. Chest pain is accompanied by L arm radiation, palpitations, and SOB. Upon arrival to ED she was tachycardiac with initial troponin of 57 with ST changes on EKG. Troponin increased to 162. Cardiology was consulted who felt this was less likely to be ACS. Her dyspnea began to worsen and she was taken for CXR, CT chest. CXR demonstrates central congestion and fluid in fissure. CT chest demonstrated bilateral GGO. O2 escalated to 4L. PCCM asked to evaluate.   Early AM hours, had worsening distress and required emergent intubation around 0400.  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 hyperthyroid.  Significant Hospital Events   8/3 admit  Consults:  Cardiology  Procedures:  ETT 8/4 >   Significant Diagnostic Tests:  CT chest 8/3 > Patchy ground-glass infiltrates are identified bilaterally predominately within the bases. Echo 8/4 > prelim low EF  Micro Data:  COVID-19 8/3 > Neg COVID-19 8/3 > Sputum 8/4 >   Antimicrobials:  Ceftriaxone 8/3 > Azithromycin 8/3 >  Interim history/subjective:  Required emergent intubation around 0400. Sedated, comfortable this AM.  Started on PTU by cardiology.  Objective   Blood pressure 95/78, pulse (!) 108, temperature (!) 102.6 F (39.2 C), resp. rate  (!) 22, height 5\' 8"  (1.727 m), weight 79.7 kg, last menstrual period 06/19/2017, SpO2 100 %.    Vent Mode: PRVC FiO2 (%):  [100 %] 100 % Set Rate:  [22 bmp] 22 bmp Vt Set:  [510 mL] 510 mL PEEP:  [10 cmH20] 10 cmH20 Plateau Pressure:  [33 cmH20-37 cmH20] 33 cmH20   Intake/Output Summary (Last 24 hours) at 05/21/2019 0835 Last data filed at 05/21/2019 0600 Gross per 24 hour  Intake 498.26 ml  Output 800 ml  Net -301.74 ml   Filed Weights   05/21/19 0500  Weight: 79.7 kg    Examination: General: middle aged female, in NAD HENT: [place]/AT, PERRL, ETT in place Lungs: CTAB Cardiovascular: Tachy, regular, no MRG Abdomen: Soft, non-tender, non-distended Extremities: warm, no edema.  Neuro: Alert, oriented, non-focal.    Assessment & Plan:   NSTEMI: Non-specific ST changes on initial EKG. Troponin up-trending from 57 >1330>1357>6121.   Chest pain is described as typical in nature.  - Cardiology following, planning for cath based on prelim low EF on bedside echo - Continue heparin infusion, ASA  Acute hypoxemic respiratory failure - presumed CAP + pulmonary edema.  PCT normal.  ? Viral PNA, initial COVID neg, repeat pending.  Required emergent intubation AM 8/4. - Continue full vent support - Leave intubated until definite cath plans decided by cardiology - Continue empiric CAP coverage - Diuresis as able - Follow CXR  Hyperthyroidism: TSH undetectably low on admission, T4 upper limit normal - doubt thyroid storm. - Continue PTU and hydrocortisone for now, can likely d/c  after cath - Hold preadmission methimazole  Hypertension:  - holding home antihypertensives for now with BP slowly decreasing.   Hypomagnesemia. - 2g Mag.  Best practice:  Diet: NPO except meds Pain/Anxiety/Delirium protocol (if indicated): Propofol gtt / Fentanyl gtt.  RASS goal -1. VAP protocol (if indicated): In place DVT prophylaxis: heparin infusion GI prophylaxis: PPI Glucose control: NA Mobility:  BR Code Status: FULL Family Communication: Will update son once we confirm plans with cardiology shortly Disposition: ICU   Critical care time: 30 mins.    Montey Hora, [facility] Pager: [phone].  If no answer, [phone] 05/21/2019, 9:06 AM  Attending Note:  48 year old female with PMH of hyperthyroidism who presents with NSTEMI and now poor EF on echo who is in respiratory failure due to pulmonary edema.  PCT is normal and COVID test is negative.  No further events overnight.  On exam, sedate with coarse BS diffusely.  I reviewed CXR myself, pulmonary infiltrate noted.  Discussed with cards and PCCM-NP.  Will proceed with cardiac cath today.  No weaning for now.  Replace electrolytes as indicated.  PCCM will continue to follow.  The patient is critically ill with multiple organ systems failure and requires high complexity decision making for assessment and support, frequent evaluation and titration of therapies, application of advanced monitoring technologies and extensive interpretation of multiple databases.   Critical Care Time devoted to patient care services described in this note is  33  Minutes. This time reflects time of care of this signee Dr Jennet Maduro. This critical care time does not reflect procedure time, or teaching time or supervisory time of PA/NP/Med student/Med Resident etc but could involve care discussion time.  Rush Farmer, M.D. [facility] Pulmonary/Critical Care Medicine. Pager: [phone]. After hours pager: [phone].

## 2019-05-21 NOTE — Progress Notes (Signed)
Sputum sample collected and sent to the lab.  

## 2019-05-21 NOTE — Progress Notes (Signed)
ETT pulled back 1cm per NP paul hoffman. ETT now at 22 at the lips.

## 2019-05-21 NOTE — Progress Notes (Signed)
Pt. Continuously stating she cannot breath. Notified CCM and cardiology. Pt with unstable vital signs. Increasing agitated. See Georgann Housekeeper, NP for intubation note.

## 2019-05-21 NOTE — ED Notes (Signed)
Report called to [facility] RN. All questions answered

## 2019-05-21 NOTE — Procedures (Addendum)
Intubation Procedure Note Amber [name]  [id number]  21-Apr-1971    Procedure: Intubation Indications: Airway protection and maintenance   Procedure Details Consent: Unable to obtain consent because of emergent medical necessity. Time Out: Performed   Maximum clean technique was used including gloves, hand hygiene and mask  Laryngoscopy equipment used: MAC 3 Glidescope   Medications: Versed 2 mg, Etomidate 20 mg, Rocuronium 50 mg   Grade 1 airway view, but obstructed by thin pink fluid, presumably pulmonary edema.    Evaluation Hemodynamic Status: BP stable throughout, transiently fell during during procedure Patient's Current Condition: stable Patient did tolerate procedure well Chest X-ray ordered to verify placement.  tube position low-repostitioned  Georgann Housekeeper, AGACNP-BC [place] Pager [phone] or [phone]  05/21/2019 4:01 AM

## 2019-05-21 NOTE — Progress Notes (Signed)
CRITICAL VALUE ALERT  Critical Value:  Lactic 4.8  Date & Time Notied:  [id number] [id number]  Provider Notified: Dr. Dana Allan  Orders Received/Actions taken: MD to follow up

## 2019-05-21 NOTE — Progress Notes (Addendum)
Reviewed patients bedside echo Moderate LV dilatation diffuse hypokinesis with preserved basal function  EF 25-30%  Suggestive of Takatsubo DCM  Discussed with Dr Nelda Marseille   Will plan heart cath while intubated latter today to define anatomy And weaning trials post cath  Still concern that this is related To thyroid storm -> Takatsubo rather than epicardial CAD IV beta Blockers written for  Note that if she gets iodinated contrast for her cath there  Is a small potential To make "storm" worse and a f/u radioactive iodine scan will not be accurate  [employer]

## 2019-05-21 NOTE — Progress Notes (Signed)
Subjective:   Intubated Seems comfortable   Objective:  Vitals:   05/21/19 0615 05/21/19 0630 05/21/19 0645 05/21/19 0728  BP: 112/81 104/84 102/79 95/78  Pulse: (!) 120 (!) 117 (!) 117 (!) 108  Resp: (!) 28 (!) 25 (!) 28 (!) 22  Temp:      TempSrc:      SpO2: 99% 99% 99% 100%  Weight:      Height:        Intake/Output from previous day:  Intake/Output Summary (Last 24 hours) at 05/21/2019 0800 Last data filed at 05/21/2019 0600 Gross per 24 hour  Intake 498.26 ml  Output 800 ml  Net -301.74 ml    Physical Exam:  Affect appropriate Chronically ill black female  HEENT: normal Neck supple with no adenopathy JVP normal no bruits no thyromegaly Lungs clear with no wheezing and good diaphragmatic motion Heart:  S1/S2 no murmur, no rub, gallop or click PMI normal Abdomen: benighn, BS positve, no tenderness, no AAA no bruit.  No HSM or HJR Distal pulses intact with no bruits No edema Neuro non-focal Skin warm and dry No muscular weakness   Lab Results: Basic Metabolic Panel: Recent Labs    05/20/19 1613 05/20/19 2115 05/21/19 0316 05/21/19 0451  NA 139  --  141 140  K 3.2*  --  4.4 3.6  CL 104  --   --   --   CO2 20*  --   --   --   GLUCOSE 144*  --   --   --   BUN 11  --   --   --   CREATININE 0.67  --   --   --   CALCIUM 9.4  --   --   --   MG  --  1.7  --   --   PHOS  --  4.0  --   --    Liver Function Tests: Recent Labs    05/20/19 2115  AST 35  ALT 15  ALKPHOS 114  BILITOT 0.4  PROT 7.1  ALBUMIN 3.3*   No results for input(s): LIPASE, AMYLASE in the last 72 hours. CBC: Recent Labs    05/20/19 1613 05/21/19 0224 05/21/19 0316 05/21/19 0451  WBC 19.8* 18.5*  --   --   NEUTROABS  --  14.7*  --   --   HGB 12.4 8.4* 13.3 13.3  HCT 36.2 24.2* 39.0 39.0  MCV 72.1* 72.9*  --   --   PLT 389 247  --   --    Cardiac Enzymes: Recent Labs    05/20/19 2115  CKTOTAL 324*   BNP: Invalid input(s): POCBNP D-Dimer: Recent Labs   05/20/19 1746  DDIMER <0.27   Hemoglobin A1C: No results for input(s): HGBA1C in the last 72 hours. Fasting Lipid Panel: Recent Labs    05/21/19 0512  TRIG 69   Thyroid Function Tests: Recent Labs    05/20/19 1746 05/20/19 1905  TSH <0.010*  --   T3FREE  --  2.1   Anemia Panel: Recent Labs    05/20/19 2115  FERRITIN 44    Imaging: Dg Chest 2 View  Result Date: 05/20/2019 CLINICAL DATA:  Chest pain and shortness of breath for 3 days EXAM: CHEST - 2 VIEW COMPARISON:  Radiograph 01/01/2015, 01/17/2013 FINDINGS: Patchy areas of consolidation with few air bronchograms are present in right lower lobe and retrocardiac space. No pneumothorax or effusion. Cardiomediastinal contours are normal. No acute osseous or  soft tissue abnormality. IMPRESSION: Findings could reflect multifocal pneumonia in the appropriate setting. Electronically Signed   By: Lovena Le M.D.   On: 05/20/2019 17:37   Ct Chest W Contrast  Result Date: 05/20/2019 CLINICAL DATA:  Abnormal chest x-ray EXAM: CT CHEST WITH CONTRAST TECHNIQUE: Multidetector CT imaging of the chest was performed during intravenous contrast administration. CONTRAST:  56m OMNIPAQUE IOHEXOL 300 MG/ML  SOLN COMPARISON:  Chest x-ray from earlier in the same day. FINDINGS: Cardiovascular: Thoracic aorta and its branches are within normal limits. No aneurysmal dilatation or dissection is seen. Moderate coronary calcifications are noted. No significant cardiac enlargement is noted. No large central pulmonary embolus is seen. Mediastinum/Nodes: Thoracic inlet is within normal limits. The esophagus as visualized is within normal limits. No hilar or mediastinal adenopathy is seen. Lungs/Pleura: The lungs are well aerated bilaterally. Patchy ground-glass infiltrates are identified bilaterally predominately within the bases. No associated effusion or pneumothorax is seen. Scattered calcified granulomas are noted. Upper Abdomen: The visualized upper abdomen  is within normal limits. Musculoskeletal: No acute bony abnormality is seen. IMPRESSION: There are a spectrum of findings in the lungs which can be seen with acute atypical infection (as well as other non-infectious etiologies). In particular, viral pneumonia (including COVID-19) should be considered in the appropriate clinical setting. Electronically Signed   By: MInez CatalinaM.D.   On: 05/20/2019 20:21   Portable Chest X-ray  Result Date: 05/21/2019 CLINICAL DATA:  Check endotracheal tube placement EXAM: PORTABLE CHEST 1 VIEW COMPARISON:  Film from previous day FINDINGS: Endotracheal tube is noted with the tip in the right mainstem bronchus. This should be withdrawn at least 4 cm. Increasing bilateral infiltrates are noted similar to that seen on recent CT examination left worse than right. Portion of this may be related to the inadvertent intubation of the right mainstem bronchus. Bony structures are within normal limits. IMPRESSION: Right mainstem bronchus intubation. This should be withdrawn approximately 4 cm. Diffuse bilateral infiltrates consistent with the patient's given clinical history. These results will be called to the ordering clinician or representative by the Radiologist Assistant, and communication documented in the PACS or zVision Dashboard. Electronically Signed   By: MInez CatalinaM.D.   On: 05/21/2019 03:50   Vas UKoreaLower Extremity Venous (dvt) (only [facility])  Result Date: 05/20/2019  Lower Venous Study Other Indications: Patient states she had pain in leg several days ago, now                    presenting with tachycardia and chest pain radiating to the                    left shoulder, SOB, and nausea. Comparison Study: No prior study on file for comparison. Performing Technologist: CSharion DoveRVS  Examination Guidelines: A complete evaluation includes B-mode imaging, spectral Doppler, color Doppler, and power Doppler as needed of all accessible portions of each vessel. Bilateral  testing is considered an integral part of a complete examination. Limited examinations for reoccurring indications may be performed as noted.  +-----+---------------+---------+-----------+----------+-------+  RIGHT Compressibility Phasicity Spontaneity Properties Summary  +-----+---------------+---------+-----------+----------+-------+  CFV   Full            Yes       Yes                             +-----+---------------+---------+-----------+----------+-------+   +---------+---------------+---------+-----------+----------+-------+  LEFT  Compressibility Phasicity Spontaneity Properties Summary  +---------+---------------+---------+-----------+----------+-------+  CFV       Full            Yes       Yes                             +---------+---------------+---------+-----------+----------+-------+  SFJ       Full                                                      +---------+---------------+---------+-----------+----------+-------+  FV Prox   Full                                                      +---------+---------------+---------+-----------+----------+-------+  FV Mid    Full                                                      +---------+---------------+---------+-----------+----------+-------+  FV Distal Full                                                      +---------+---------------+---------+-----------+----------+-------+  PFV       Full                                                      +---------+---------------+---------+-----------+----------+-------+  POP       Full            Yes       Yes                             +---------+---------------+---------+-----------+----------+-------+  PTV       Full                                                      +---------+---------------+---------+-----------+----------+-------+  PERO      Full                                                      +---------+---------------+---------+-----------+----------+-------+     Summary: Right: No  evidence of common femoral vein obstruction. Left: There is no evidence of deep vein thrombosis in the lower extremity.  *See table(s) above for measurements and observations.    Preliminary     Cardiac Studies:  ECG: ST non  specific ST changes    Telemetry: ST rates in 1120 range   Echo:   Medications:    chlorhexidine gluconate (MEDLINE KIT)  15 mL Mouth Rinse BID   Chlorhexidine Gluconate Cloth  6 each Topical Daily   mouth rinse  15 mL Mouth Rinse 10 times per day   pantoprazole (PROTONIX) IV  40 mg Intravenous Daily      azithromycin Stopped (05/21/19 0259)   cefTRIAXone (ROCEPHIN)  IV Stopped (05/21/19 0204)   fentaNYL infusion INTRAVENOUS 150 mcg/hr (05/21/19 0600)   heparin 900 Units/hr (05/21/19 0600)   magnesium sulfate bolus IVPB     nitroGLYCERIN Stopped (05/21/19 0404)   norepinephrine (LEVOPHED) Adult infusion     propofol (DIPRIVAN) infusion 20 mcg/kg/min (05/21/19 4098)    Assessment/Plan:   Elevated Troponin:  No acute ECG changes have ordered stat echo to evaluate LV function IF  EF markedly reduced can consider cath while intubated today   Dyspnea:  Unclear picture BNP only 137 and CT ? Atypical pneumonia On vent Continue azithromycin and rocephin.  Echo is pending   Thyroid:  I think this is the biggest issue with tachycardia and decompensation Discussed with Pharm D Should start PTU 200 mg q4 hours,  ? Lugols solution and hydrocortisone to limit peripheral conversion I will write  Her for iv beta blockers for HR Echo being done at bedside now   Jenkins Rouge 05/21/2019, 8:00 AM

## 2019-05-21 NOTE — Progress Notes (Signed)
ETT pulled back 3cm per Georgann Housekeeper.  ETT now secured at 23 at the lips.

## 2019-05-21 NOTE — Progress Notes (Signed)
Post intubation ABG drawn, results called to [place].

## 2019-05-21 NOTE — Progress Notes (Signed)
  Echocardiogram 2D Echocardiogram has been performed.  Amber [name] L Androw 05/21/2019, 9:10 AM

## 2019-05-21 NOTE — Progress Notes (Signed)
Patient's Son Amber [name] at bedside but there is a language barrier as his primary language is Spanish.  Amber [name] able to communicate with RN's and understands English but not able to clearly understand medical terms and complex medical concepts as it pertains to patient's care.  RN and MD's have utilized Spanish translators to aid in discussion.  Amber [name] called his family member Amber [name] who is the patient's nephew and requested RN speak with Amber [name].  Amber [name] expressed concerns to RN that Amber [name] was not understanding patient's treatment plan and the information MD's were providing him.  Amber [name] to assist with Amber [name]'s understanding in patient's status and planned courses of treatment.  RN discussed this with Amber [name] and he expressed his desire to have Amber [name] present to assist him in understanding patient's condition and plan of care.     Due to visitor restrictions at this time, RN explained to [place] and [place] that 2 visitors can not be present.  Previously, Amber [name] had called Amber [name] and placed [place] on speaker phone during MD conversation and both Amber [name] and Amber [name] felt this worked well.   Due to visitor restrictions, Amber [name] will call and have Amber [name] on speaker phone during all MD conversations as well as MD to utilize translator for [employer].  RN to place note in chart, documentation in room on white board, and will communicate to RN's during hand off report to assist in ensuring Amber [name] is available during conversations to help Amber [name] understand and assist with medical decisions.   Amber [name] provided Amber [name] with password so Amber [name] can call and obtain updates from nursing staff on patient.     Patient also has a daughter that is currently living in the [place]  Amber [name] is working on getting daughter to [facility] to see patient.  Per Amber [name], Patient's daughter speaks and understands English very well and will be able to assist  Amber [name] when/if she arrives.

## 2019-05-21 NOTE — ED Notes (Signed)
Son's phone number: [phone]

## 2019-05-21 NOTE — Progress Notes (Signed)
Spoke with Dr Ellyn Hack regarding heart cath scheduled.  Patient had a fever this am around 5am 102.6,  WBC increased to 33.  Will reassess tomorrow am.

## 2019-05-21 NOTE — Progress Notes (Signed)
ANTICOAGULATION CONSULT NOTE - Follow Up Consult  Pharmacy Consult for Heparin  Indication: chest pain/ACS  No Known Allergies   Vital Signs: Temp: 100.7 F (38.2 C) (08/04 1600) Temp Source: Axillary (08/04 1600) BP: 110/80 (08/04 1900) Pulse Rate: 96 (08/04 1927)  Ideal body weight: 63.9 kg  Heparin dosing weight: 79.7 kg  Labs: Recent Labs    05/20/19 1613  05/20/19 2115 05/21/19 0224 05/21/19 0316 05/21/19 0451 05/21/19 0512 05/21/19 0959 05/21/19 1025 05/21/19 1058 05/21/19 1841  HGB 12.4  --   --  8.4* 13.3 13.3  --   --   --  12.4  --   HCT 36.2  --   --  24.2* 39.0 39.0  --   --   --  35.6*  --   PLT 389  --   --  247  --   --   --   --   --  365  --   APTT  --   --   --  74*  --   --   --   --   --   --   --   LABPROT  --   --   --  20.5*  --   --   --   --  14.5  --   --   INR  --   --   --  1.8*  --   --   --   --  1.1  --   --   HEPARINUNFRC  --   --   --   --   --   --   --  0.11*  --   --  0.13*  CREATININE 0.67  --   --   --   --   --   --   --  0.62  --   --   CKTOTAL  --   --  324*  --   --   --   --   --   --   --   --   TROPONINIHS 57*   < > 1,330* 1,357*  --   --  6,121* 7,867*  --   --   --    < > = values in this interval not displayed.    Estimated Creatinine Clearance: 95.3 mL/min (by C-G formula based on SCr of 0.62 mg/dL).   Medical History: Past Medical History:  Diagnosis Date  . HTN (hypertension)   . 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 Mixed picture for NSTEMI vs. Thyroid storm vs. takotsubo syndrome. No anticoagulation PTA. Pharmacy consulted to dose heparin. -heparin level up to 0.13 after increase to 1150 units/hr  Goal of Therapy:  Heparin level 0.3-0.7 units/ml Monitor platelets by anticoagulation protocol: Yes    Plan:  -heparin bolus 2000 units -Increase infusion to 1400 units/hr -Heparin level in 6 hours and daily wth CBC daily  Hildred Laser,  PharmD Clinical Pharmacist **Pharmacist phone directory can now be found on [email or web address] ([email or web address]).  Listed under [place].

## 2019-05-21 NOTE — Progress Notes (Signed)
PCCM INTERVAL PROGRESS NOTE  Rn has touched base with cardiologist regarding most recent troponin (1300). He recommends heparin infusion and cardiology will evaluate in AM.   Georgann Housekeeper, AGACNP-BC [place] Pager [phone] or [phone]  05/21/2019 12:24 AM

## 2019-05-21 NOTE — Progress Notes (Signed)
[facility] PCCM PROGRESS NOTE  S: Called to bedside for emergent evaluation of patient in the setting of delirium and hypoxia. She had become increasingly delirious since admission and was treated by EMD with PRN haldol, versed. As the night progressed her mentation and hypoxia worsened.   O: BP (!) 145/98   Pulse (!) 140   Temp 99.4 F (37.4 C)   Resp (!) 41   LMP 06/19/2017 (Approximate)   SpO2 97%    ABG    Component Value Date/Time   PHART 7.290 (L) 05/21/2019 0316   PCO2ART 43.2 05/21/2019 0316   PO2ART 40.0 (LL) 05/21/2019 0316   HCO3 20.7 05/21/2019 0316   TCO2 22 05/21/2019 0316   ACIDBASEDEF 6.0 (H) 05/21/2019 0316   O2SAT 67.0 05/21/2019 0316    General:  Obtunded middle aged female  HEENT:  [place]/AT, PERRL, no JVD Cardiovascular:  Tachy to 160s.  Lungs:  Coarse crackles throughout Abdomen:  Soft, non-tender, non-distended Musculoskeletal:  No acute deformity Skin:  Intact, MMM   A/P:  Acute hypoxemic respiratory failure: thin pink secretions seen during intubation further raise concern for pulmonary edema.  - full vent support - 80mg  lasix now - Fentanyl and PRN versed per PAD protocol for RASS goal -1 to -2.  - CXR - ABG in one hour  NSTEMI - Cardiology has been kept aware of progression by RN, no further plans at this point.  - Repeat troponin 0500. Repeat EKG once sedation achieved and HR slows.  Attempted to update son at number in EMR. No answer/no voicemail.   Georgann Housekeeper, ACNP [facility]/Critical Care Pager [phone] or [phone]

## 2019-05-21 NOTE — Progress Notes (Signed)
Initial Nutrition Assessment  RD working remotely.  DOCUMENTATION CODES:   Not applicable  INTERVENTION:   If unable to extubate pt, recommend initiation of tube feeds: - Vital AF 1.2 @ 60 ml/hr via OG tube (1440 ml/day)  Tube feeding regimen provides 1728 kcal, 108 grams of protein, and 1168 ml of H2O.  NUTRITION DIAGNOSIS:   Inadequate oral intake related to inability to eat as evidenced by NPO status.  GOAL:   Patient will meet greater than or equal to 90% of their needs  MONITOR:   Labs, I & O's, Weight trends, Vent status  REASON FOR ASSESSMENT:   Ventilator    ASSESSMENT:   48 year old female who presented to the ED on 8/03 with chest pain, SOB, and nausea. PMH of HTN, HLD, anemia, recent diagnosis of hyperthyroidism. Pt admitted with NSTEMI. Pt developed acute hypoxemia respiratory failure and required intubation on 8/04.   Noted plans for cardiac cath today while pt is intubated.  OG tube in place.  Reviewed weight history in chart. Pt with a weight loss of 15.8 kg since 11/28/18. This is a 16.5% weight loss which is significant for timeframe. Suspect weight loss related to new diagnosis of hyperthyroidism.  Patient is currently intubated on ventilator support MV: 11.2 L/min Temp (24hrs), Avg:99.6 F (37.6 C), Min:98.2 F (36.8 C), Max:102.6 F (39.2 C) BP: 99.67 MAP: 78  Drips: Propofol: off this AM Fentanyl: 10 ml/hr Heparin: 9 ml/hr Nitroglycerin: 10 ml/hr  Medications reviewed and include: Solu-cortef, Protonix, IV abx  Labs reviewed: lactic acid 4.8  UOP: 800 ml x 24 hours  NUTRITION - FOCUSED PHYSICAL EXAM:  Unable to complete at this time. RD working remotely.  Diet Order:   Diet Order            Diet NPO time specified  Diet effective now              EDUCATION NEEDS:   No education needs have been identified at this time  Skin:  Skin Assessment: Reviewed RN Assessment  Last BM:  no documented BM  Height:   Ht  Readings from Last 1 Encounters:  05/21/19 5\' 8"  (1.727 m)    Weight:   Wt Readings from Last 1 Encounters:  05/21/19 79.7 kg    Ideal Body Weight:  63.6 kg  BMI:  Body mass index is 26.72 kg/m.  Estimated Nutritional Needs:   Kcal:  7517  Protein:  100-115 grams  Fluid:  >/= 1.8 L    Gaynell Face, MS, RD, LDN Inpatient Clinical Dietitian Pager: [phone] Weekend/After Hours: [phone]

## 2019-05-21 NOTE — Progress Notes (Signed)
ANTICOAGULATION CONSULT NOTE - Follow Up Consult  Pharmacy Consult for Heparin  Indication: chest pain/ACS  No Known Allergies   Vital Signs: Temp: 99.7 F (37.6 C) (08/04 0900) Temp Source: Oral (08/04 0900) BP: 78/57 (08/04 1106) Pulse Rate: 110 (08/04 1106)  Ideal body weight: 63.9 kg  Heparin dosing weight: 79.7 kg  Labs: Recent Labs    05/20/19 1613  05/20/19 2115 05/21/19 0224 05/21/19 0316 05/21/19 0451 05/21/19 0512 05/21/19 0959  HGB 12.4  --   --  8.4* 13.3 13.3  --   --   HCT 36.2  --   --  24.2* 39.0 39.0  --   --   PLT 389  --   --  247  --   --   --   --   APTT  --   --   --  74*  --   --   --   --   LABPROT  --   --   --  20.5*  --   --   --   --   INR  --   --   --  1.8*  --   --   --   --   HEPARINUNFRC  --   --   --   --   --   --   --  0.11*  CREATININE 0.67  --   --   --   --   --   --   --   CKTOTAL  --   --  324*  --   --   --   --   --   TROPONINIHS 57*   < > 1,330* 1,357*  --   --  6,121*  --    < > = values in this interval not displayed.    Estimated Creatinine Clearance: 95.3 mL/min (by C-G formula based on SCr of 0.67 mg/dL).   Medical History: Past Medical History:  Diagnosis Date  . HTN (hypertension)   . 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 Mixed picture for NSTEMI vs. Thyroid storm vs. takotsubo syndrome. No anticoagulation PTA. Pharmacy consulted to dose heparin.  Heparin level (0.11) subtherapeutic despite 4000 unit bolus and drip rate 900 units/hr. Repeat CBC WNL (Hgb 12.4, plt 365). Low Hgb of 8.4 overnight likely lab error. No bleeding per RN, had infusion difficulty overnight but heparin was not paused more than a few minutes. Will not re-bolus heparin at this time due to planned cath later today.  Goal of Therapy:  Heparin level 0.3-0.7 units/ml Monitor platelets by anticoagulation protocol: Yes    Plan:  Increase heparin drip to 1150 units/hr Daily CBC/HL  while on heparin Monitor for bleeding Follow-up after cath lab today  Richardine Service, PharmD PGY1 Pharmacy Resident Phone: [phone] 05/21/2019  12:15 PM  Please check [email or web address] for unit-specific pharmacy phone numbers.

## 2019-05-22 ENCOUNTER — Encounter (HOSPITAL_COMMUNITY)
Admission: EM | Disposition: A | Discharge status: Home / Self Care | Payer: Self-pay | Source: Home / Self Care | Attending: Pulmonary Disease

## 2019-05-22 ENCOUNTER — Encounter (HOSPITAL_COMMUNITY): Payer: Self-pay | Admitting: Cardiovascular Disease

## 2019-05-22 ENCOUNTER — Inpatient Hospital Stay (HOSPITAL_COMMUNITY): Payer: Commercial Managed Care - PPO

## 2019-05-22 DIAGNOSIS — G9340 Encephalopathy, unspecified: Secondary | ICD-10-CM

## 2019-05-22 DIAGNOSIS — J189 Pneumonia, unspecified organism: Secondary | ICD-10-CM

## 2019-05-22 DIAGNOSIS — I2510 Atherosclerotic heart disease of native coronary artery without angina pectoris: Secondary | ICD-10-CM

## 2019-05-22 DIAGNOSIS — I5043 Acute on chronic combined systolic (congestive) and diastolic (congestive) heart failure: Secondary | ICD-10-CM

## 2019-05-22 DIAGNOSIS — R570 Cardiogenic shock: Secondary | ICD-10-CM

## 2019-05-22 HISTORY — PX: CORONARY STENT INTERVENTION: CATH118234

## 2019-05-22 HISTORY — PX: RIGHT/LEFT HEART CATH AND CORONARY ANGIOGRAPHY: CATH118266

## 2019-05-22 LAB — POCT I-STAT EG7
Acid-base deficit: 2 mmol/L (ref 0.0–2.0)
Bicarbonate: 22.5 mmol/L (ref 20.0–28.0)
Calcium, Ion: 1.22 mmol/L (ref 1.15–1.40)
HCT: 31 % — ABNORMAL LOW (ref 36.0–46.0)
Hemoglobin: 10.5 g/dL — ABNORMAL LOW (ref 12.0–15.0)
O2 Saturation: 79 %
Potassium: 3.2 mmol/L — ABNORMAL LOW (ref 3.5–5.1)
Sodium: 133 mmol/L — ABNORMAL LOW (ref 135–145)
TCO2: 24 mmol/L (ref 22–32)
pCO2, Ven: 37.4 mmHg — ABNORMAL LOW (ref 44.0–60.0)
pH, Ven: 7.388 (ref 7.250–7.430)
pO2, Ven: 43 mmHg (ref 32.0–45.0)

## 2019-05-22 LAB — CBC WITH DIFFERENTIAL/PLATELET
Abs Immature Granulocytes: 0.1 10*3/uL — ABNORMAL HIGH (ref 0.00–0.07)
Basophils Absolute: 0.1 10*3/uL (ref 0.0–0.1)
Basophils Relative: 0 %
Eosinophils Absolute: 0 10*3/uL (ref 0.0–0.5)
Eosinophils Relative: 0 %
HCT: 32.9 % — ABNORMAL LOW (ref 36.0–46.0)
Hemoglobin: 11.2 g/dL — ABNORMAL LOW (ref 12.0–15.0)
Immature Granulocytes: 0 %
Lymphocytes Relative: 22 %
Lymphs Abs: 5.6 10*3/uL — ABNORMAL HIGH (ref 0.7–4.0)
MCH: 24.6 pg — ABNORMAL LOW (ref 26.0–34.0)
MCHC: 34 g/dL (ref 30.0–36.0)
MCV: 72.3 fL — ABNORMAL LOW (ref 80.0–100.0)
Monocytes Absolute: 2.1 10*3/uL — ABNORMAL HIGH (ref 0.1–1.0)
Monocytes Relative: 8 %
Neutro Abs: 17.9 10*3/uL — ABNORMAL HIGH (ref 1.7–7.7)
Neutrophils Relative %: 70 %
Platelets: 309 10*3/uL (ref 150–400)
RBC: 4.55 MIL/uL (ref 3.87–5.11)
RDW: 15.7 % — ABNORMAL HIGH (ref 11.5–15.5)
WBC: 25.8 10*3/uL — ABNORMAL HIGH (ref 4.0–10.5)
nRBC: 0 % (ref 0.0–0.2)

## 2019-05-22 LAB — BASIC METABOLIC PANEL
Anion gap: 12 (ref 5–15)
BUN: 16 mg/dL (ref 6–20)
CO2: 23 mmol/L (ref 22–32)
Calcium: 9.3 mg/dL (ref 8.9–10.3)
Chloride: 105 mmol/L (ref 98–111)
Creatinine, Ser: 0.5 mg/dL (ref 0.44–1.00)
GFR calc Af Amer: >60 mL/min (ref >60)
GFR calc non Af Amer: >60 mL/min (ref >60)
Glucose, Bld: 143 mg/dL — ABNORMAL HIGH (ref 70–99)
Potassium: 3.6 mmol/L (ref 3.5–5.1)
Sodium: 140 mmol/L (ref 135–145)

## 2019-05-22 LAB — POCT I-STAT 7, (LYTES, BLD GAS, ICA,H+H)
Acid-Base Excess: 3 mmol/L — ABNORMAL HIGH (ref 0.0–2.0)
Bicarbonate: 26 mmol/L (ref 20.0–28.0)
Calcium, Ion: 1.25 mmol/L (ref 1.15–1.40)
HCT: 30 % — ABNORMAL LOW (ref 36.0–46.0)
Hemoglobin: 10.2 g/dL — ABNORMAL LOW (ref 12.0–15.0)
O2 Saturation: 100 %
Potassium: 3.3 mmol/L — ABNORMAL LOW (ref 3.5–5.1)
Sodium: 141 mmol/L (ref 135–145)
TCO2: 27 mmol/L (ref 22–32)
pCO2 arterial: 34.5 mmHg (ref 32.0–48.0)
pH, Arterial: 7.484 — ABNORMAL HIGH (ref 7.350–7.450)
pO2, Arterial: 325 mmHg — ABNORMAL HIGH (ref 83.0–108.0)

## 2019-05-22 LAB — MAGNESIUM: Magnesium: 2.1 mg/dL (ref 1.7–2.4)

## 2019-05-22 LAB — INTERLEUKIN-6, PLASMA: Interleukin-6, Plasma: 102.6 pg/mL — ABNORMAL HIGH (ref 0.0–12.2)

## 2019-05-22 LAB — PHOSPHORUS: Phosphorus: 3.9 mg/dL (ref 2.5–4.6)

## 2019-05-22 LAB — HEPARIN LEVEL (UNFRACTIONATED): Heparin Unfractionated: 0.3 IU/mL (ref 0.30–0.70)

## 2019-05-22 LAB — POCT ACTIVATED CLOTTING TIME
Activated Clotting Time: 268 seconds
Activated Clotting Time: 274 seconds

## 2019-05-22 SURGERY — RIGHT/LEFT HEART CATH AND CORONARY ANGIOGRAPHY
Anesthesia: LOCAL

## 2019-05-22 MED ORDER — ASPIRIN 325 MG PO TABS
ORAL_TABLET | ORAL | Status: AC
  Filled 2019-05-22: qty 1

## 2019-05-22 MED ORDER — HEPARIN SODIUM (PORCINE) 1000 UNIT/ML IJ SOLN
INTRAMUSCULAR | Status: AC
  Filled 2019-05-22: qty 1

## 2019-05-22 MED ORDER — METOPROLOL TARTRATE 5 MG/5ML IV SOLN
INTRAVENOUS | Status: AC
  Filled 2019-05-22: qty 5

## 2019-05-22 MED ORDER — MIDAZOLAM HCL 2 MG/2ML IJ SOLN
INTRAMUSCULAR | Status: DC | PRN
  Administered 2019-05-22 (×2): 1 mg via INTRAVENOUS

## 2019-05-22 MED ORDER — TICAGRELOR 90 MG PO TABS
90.0000 mg | ORAL_TABLET | Freq: Two times a day (BID) | ORAL | Status: DC
  Administered 2019-05-22 – 2019-05-25 (×6): 90 mg via ORAL
  Filled 2019-05-22 (×6): qty 1

## 2019-05-22 MED ORDER — LIDOCAINE HCL (PF) 1 % IJ SOLN
INTRAMUSCULAR | Status: DC | PRN
  Administered 2019-05-22 (×2): 2 mL via INTRADERMAL

## 2019-05-22 MED ORDER — HEPARIN (PORCINE) IN NACL 1000-0.9 UT/500ML-% IV SOLN
INTRAVENOUS | Status: DC | PRN
  Administered 2019-05-22 (×2): 500 mL

## 2019-05-22 MED ORDER — SODIUM CHLORIDE 0.9 % IV SOLN: 250.0000 mL | INTRAVENOUS | Status: DC | PRN

## 2019-05-22 MED ORDER — ORAL CARE MOUTH RINSE
15.0000 mL | Freq: Two times a day (BID) | OROMUCOSAL | Status: DC
  Administered 2019-05-22 – 2019-05-25 (×4): 15 mL via OROMUCOSAL

## 2019-05-22 MED ORDER — ENOXAPARIN SODIUM 40 MG/0.4ML ~~LOC~~ SOLN
40.0000 mg | SUBCUTANEOUS | Status: DC
  Administered 2019-05-23 – 2019-05-25 (×3): 40 mg via SUBCUTANEOUS
  Filled 2019-05-22 (×3): qty 0.4

## 2019-05-22 MED ORDER — POTASSIUM CHLORIDE 20 MEQ/15ML (10%) PO SOLN: 20.0000 meq | Freq: Once | ORAL | Status: DC

## 2019-05-22 MED ORDER — ASPIRIN 325 MG PO TABS
ORAL_TABLET | ORAL | Status: DC | PRN
  Administered 2019-05-22: 325 mg via NASOGASTRIC

## 2019-05-22 MED ORDER — IOHEXOL 350 MG/ML SOLN
INTRAVENOUS | Status: DC | PRN
  Administered 2019-05-22: 125 mL via INTRA_ARTERIAL

## 2019-05-22 MED ORDER — NITROGLYCERIN 1 MG/10 ML FOR IR/CATH LAB
INTRA_ARTERIAL | Status: AC
  Filled 2019-05-22: qty 10

## 2019-05-22 MED ORDER — EPINEPHRINE PF 1 MG/ML IJ SOLN
0.5000 ug/min | INTRAVENOUS | Status: DC
  Filled 2019-05-22: qty 4

## 2019-05-22 MED ORDER — TICAGRELOR 90 MG PO TABS
ORAL_TABLET | ORAL | Status: AC
  Filled 2019-05-22: qty 2

## 2019-05-22 MED ORDER — LIDOCAINE HCL (PF) 1 % IJ SOLN
INTRAMUSCULAR | Status: AC
  Filled 2019-05-22: qty 30

## 2019-05-22 MED ORDER — SODIUM CHLORIDE 0.9% FLUSH: 3.0000 mL | INTRAVENOUS | Status: DC | PRN

## 2019-05-22 MED ORDER — METOPROLOL TARTRATE 5 MG/5ML IV SOLN
INTRAVENOUS | Status: DC | PRN
  Administered 2019-05-22: 2.5 mg via INTRAVENOUS

## 2019-05-22 MED ORDER — ASPIRIN 81 MG PO CHEW
81.0000 mg | CHEWABLE_TABLET | Freq: Every day | ORAL | Status: DC
  Administered 2019-05-23 – 2019-05-25 (×3): 81 mg via ORAL
  Filled 2019-05-22 (×3): qty 1

## 2019-05-22 MED ORDER — MIDAZOLAM HCL 2 MG/2ML IJ SOLN
INTRAMUSCULAR | Status: AC
  Filled 2019-05-22: qty 2

## 2019-05-22 MED ORDER — FUROSEMIDE 10 MG/ML IJ SOLN
40.0000 mg | Freq: Every day | INTRAMUSCULAR | Status: DC
  Administered 2019-05-23 – 2019-05-25 (×3): 40 mg via INTRAVENOUS
  Filled 2019-05-22 (×3): qty 4

## 2019-05-22 MED ORDER — VERAPAMIL HCL 2.5 MG/ML IV SOLN
INTRAVENOUS | Status: AC
  Filled 2019-05-22: qty 2

## 2019-05-22 MED ORDER — METOPROLOL TARTRATE 25 MG PO TABS
25.0000 mg | ORAL_TABLET | Freq: Two times a day (BID) | ORAL | Status: DC
  Administered 2019-05-22 – 2019-05-25 (×7): 25 mg via ORAL
  Filled 2019-05-22 (×7): qty 1

## 2019-05-22 MED ORDER — TICAGRELOR 90 MG PO TABS
ORAL_TABLET | ORAL | Status: DC | PRN
  Administered 2019-05-22: 180 mg via NASOGASTRIC

## 2019-05-22 MED ORDER — SODIUM CHLORIDE 0.9% FLUSH
3.0000 mL | Freq: Two times a day (BID) | INTRAVENOUS | Status: DC
  Administered 2019-05-22 – 2019-05-25 (×5): 3 mL via INTRAVENOUS

## 2019-05-22 MED ORDER — HEPARIN SODIUM (PORCINE) 1000 UNIT/ML IJ SOLN
INTRAMUSCULAR | Status: DC | PRN
  Administered 2019-05-22: 4000 [IU] via INTRAVENOUS
  Administered 2019-05-22: 5000 [IU] via INTRAVENOUS
  Administered 2019-05-22: 2000 [IU] via INTRAVENOUS

## 2019-05-22 MED ORDER — NITROGLYCERIN 1 MG/10 ML FOR IR/CATH LAB
INTRA_ARTERIAL | Status: DC | PRN
  Administered 2019-05-22: 200 ug via INTRACORONARY

## 2019-05-22 MED ORDER — FUROSEMIDE 10 MG/ML IJ SOLN: 20.0000 mg | Freq: Once | INTRAMUSCULAR | Status: DC

## 2019-05-22 MED ORDER — FUROSEMIDE 10 MG/ML IJ SOLN
40.0000 mg | Freq: Two times a day (BID) | INTRAMUSCULAR | Status: DC
  Administered 2019-05-22: 08:00:00 40 mg via INTRAVENOUS
  Filled 2019-05-22: qty 4

## 2019-05-22 MED ORDER — VERAPAMIL HCL 2.5 MG/ML IV SOLN
INTRAVENOUS | Status: DC | PRN
  Administered 2019-05-22 (×2): 10 mL via INTRA_ARTERIAL

## 2019-05-22 MED ORDER — ATORVASTATIN CALCIUM 80 MG PO TABS
80.0000 mg | ORAL_TABLET | Freq: Every day | ORAL | Status: DC
  Administered 2019-05-22 – 2019-05-24 (×3): 80 mg via ORAL
  Filled 2019-05-22 (×3): qty 1

## 2019-05-22 MED ORDER — HEPARIN (PORCINE) IN NACL 1000-0.9 UT/500ML-% IV SOLN
INTRAVENOUS | Status: AC
  Filled 2019-05-22: qty 1000

## 2019-05-22 SURGICAL SUPPLY — 21 items
BALLN SAPPHIRE 2.5X12 (BALLOONS) ×2
BALLN SAPPHIRE ~~LOC~~ 3.25X10 (BALLOONS) ×2 IMPLANT
BALLOON SAPPHIRE 2.5X12 (BALLOONS) ×1 IMPLANT
CATH BALLN WEDGE 5F 110CM (CATHETERS) ×2 IMPLANT
CATH INFINITI 5FR JK (CATHETERS) ×2 IMPLANT
CATH LAUNCHER 6FR EBU3.5 (CATHETERS) ×2 IMPLANT
DEVICE RAD COMP TR BAND LRG (VASCULAR PRODUCTS) ×2 IMPLANT
GLIDESHEATH SLEND SS 6F .021 (SHEATH) ×2 IMPLANT
GUIDEWIRE INQWIRE 1.5J.035X260 (WIRE) ×1 IMPLANT
INQWIRE 1.5J .035X260CM (WIRE) ×2
KIT ENCORE 26 ADVANTAGE (KITS) ×2 IMPLANT
KIT HEART LEFT (KITS) ×2 IMPLANT
PACK CARDIAC CATHETERIZATION (CUSTOM PROCEDURE TRAY) ×2 IMPLANT
SHEATH GLIDE SLENDER 4/5FR (SHEATH) ×2 IMPLANT
SHEATH PROBE COVER 6X72 (BAG) ×2 IMPLANT
STENT RESOLUTE ONYX 3.0X18 (Permanent Stent) ×2 IMPLANT
SYR MEDRAD MARK 7 150ML (SYRINGE) ×2 IMPLANT
TRANSDUCER W/STOPCOCK (MISCELLANEOUS) ×2 IMPLANT
TUBING CIL FLEX 10 FLL-RA (TUBING) ×2 IMPLANT
WIRE EMERALD 3MM-J .025X260CM (WIRE) ×2 IMPLANT
WIRE RUNTHROUGH .014X180CM (WIRE) ×2 IMPLANT

## 2019-05-22 NOTE — Procedures (Signed)
Extubation Procedure Note  Patient Details:   Name: Amber [name] DOB: September 14, 1971 MRN: [id number]   Airway Documentation:    Vent end date: 05/22/19 Vent end time: 1329   Evaluation  O2 sats: stable throughout Complications: No apparent complications Patient did tolerate procedure well. Bilateral Breath Sounds: Clear   Yes: patient was able to breathe around the ETT prior to extubation and speak post extubation. No stridor noted at this time.   Amber [name] 05/22/2019, 1:29 PM

## 2019-05-22 NOTE — Progress Notes (Signed)
Subjective:   Intubated Understands conversations alert   Objective:  Vitals:   05/22/19 0625 05/22/19 0700 05/22/19 0746 05/22/19 0750  BP: 125/77 117/75    Pulse: (!) 129 (!) 117    Resp: (!) 25 (!) 23    Temp:   99.1 F (37.3 C)   TempSrc:   Oral   SpO2: 100% 100%  100%  Weight:      Height:        Intake/Output from previous day:  Intake/Output Summary (Last 24 hours) at 05/22/2019 0756 Last data filed at 05/22/2019 0700 Gross per 24 hour  Intake 1327.3 ml  Output 1160 ml  Net 167.3 ml    Physical Exam:  Affect appropriate Chronically ill black female  HEENT:exopthalmus  Neck supple with no adenopathy JVP normal no bruits no thyromegaly Lungs clear with no wheezing and good diaphragmatic motion Heart:  S1/S2 no murmur, no rub, gallop or click PMI normal Abdomen: benighn, BS positve, no tenderness, no AAA no bruit.  No HSM or HJR Distal pulses intact with no bruits No edema Neuro non-focal Skin warm and dry No muscular weakness   Lab Results: Basic Metabolic Panel: Recent Labs    05/20/19 1613 05/20/19 2115  05/21/19 0451 05/21/19 1025  NA 139  --    < > 140 140  K 3.2*  --    < > 3.6 4.1  CL 104  --   --   --  106  CO2 20*  --   --   --  18*  GLUCOSE 144*  --   --   --  139*  BUN 11  --   --   --  15  CREATININE 0.67  --   --   --  0.62  CALCIUM 9.4  --   --   --  8.9  MG  --  1.7  --   --   --   PHOS  --  4.0  --   --   --    < > = values in this interval not displayed.   Liver Function Tests: Recent Labs    05/20/19 2115  AST 35  ALT 15  ALKPHOS 114  BILITOT 0.4  PROT 7.1  ALBUMIN 3.3*   No results for input(s): LIPASE, AMYLASE in the last 72 hours. CBC: Recent Labs    05/21/19 0224  05/21/19 1058 05/22/19 0633  WBC 18.5*  --  33.4* 25.8*  NEUTROABS 14.7*  --   --  PENDING  HGB 8.4*   < > 12.4 11.2*  HCT 24.2*   < > 35.6* 32.9*  MCV 72.9*  --  70.8* 72.3*  PLT 247  --  365 309   < > = values in this interval not  displayed.   Cardiac Enzymes: Recent Labs    05/20/19 2115  CKTOTAL 324*   BNP: Invalid input(s): POCBNP D-Dimer: Recent Labs    05/20/19 1746  DDIMER <0.27   Hemoglobin A1C: No results for input(s): HGBA1C in the last 72 hours. Fasting Lipid Panel: Recent Labs    05/21/19 0512  TRIG 69   Thyroid Function Tests: Recent Labs    05/20/19 1746 05/20/19 1905  TSH <0.010*  --   T3FREE  --  2.1   Anemia Panel: Recent Labs    05/20/19 2115  FERRITIN 44    Imaging: Dg Chest 2 View  Result Date: 05/20/2019 CLINICAL DATA:  Chest pain and shortness of breath  for 3 days EXAM: CHEST - 2 VIEW COMPARISON:  Radiograph 01/01/2015, 01/17/2013 FINDINGS: Patchy areas of consolidation with few air bronchograms are present in right lower lobe and retrocardiac space. No pneumothorax or effusion. Cardiomediastinal contours are normal. No acute osseous or soft tissue abnormality. IMPRESSION: Findings could reflect multifocal pneumonia in the appropriate setting. Electronically Signed   By: Lovena Le M.D.   On: 05/20/2019 17:37   Dg Abd 1 View  Result Date: 05/21/2019 CLINICAL DATA:  NG tube placement EXAM: ABDOMEN - 1 VIEW COMPARISON:  None. FINDINGS: The tip of the NG tube is seen projecting over the distal stomach. The side hole is seen projecting over the mid stomach. Air seen in nondilated loops of bowel. IMPRESSION: Tip of NG tube seen projecting over the stomach. Electronically Signed   By: Prudencio Pair M.D.   On: 05/21/2019 10:19   Ct Chest W Contrast  Result Date: 05/20/2019 CLINICAL DATA:  Abnormal chest x-ray EXAM: CT CHEST WITH CONTRAST TECHNIQUE: Multidetector CT imaging of the chest was performed during intravenous contrast administration. CONTRAST:  30m OMNIPAQUE IOHEXOL 300 MG/ML  SOLN COMPARISON:  Chest x-ray from earlier in the same day. FINDINGS: Cardiovascular: Thoracic aorta and its branches are within normal limits. No aneurysmal dilatation or dissection is seen.  Moderate coronary calcifications are noted. No significant cardiac enlargement is noted. No large central pulmonary embolus is seen. Mediastinum/Nodes: Thoracic inlet is within normal limits. The esophagus as visualized is within normal limits. No hilar or mediastinal adenopathy is seen. Lungs/Pleura: The lungs are well aerated bilaterally. Patchy ground-glass infiltrates are identified bilaterally predominately within the bases. No associated effusion or pneumothorax is seen. Scattered calcified granulomas are noted. Upper Abdomen: The visualized upper abdomen is within normal limits. Musculoskeletal: No acute bony abnormality is seen. IMPRESSION: There are a spectrum of findings in the lungs which can be seen with acute atypical infection (as well as other non-infectious etiologies). In particular, viral pneumonia (including COVID-19) should be considered in the appropriate clinical setting. Electronically Signed   By: MInez CatalinaM.D.   On: 05/20/2019 20:21   Portable Chest X-ray  Result Date: 05/21/2019 CLINICAL DATA:  Check endotracheal tube placement EXAM: PORTABLE CHEST 1 VIEW COMPARISON:  Film from previous day FINDINGS: Endotracheal tube is noted with the tip in the right mainstem bronchus. This should be withdrawn at least 4 cm. Increasing bilateral infiltrates are noted similar to that seen on recent CT examination left worse than right. Portion of this may be related to the inadvertent intubation of the right mainstem bronchus. Bony structures are within normal limits. IMPRESSION: Right mainstem bronchus intubation. This should be withdrawn approximately 4 cm. Diffuse bilateral infiltrates consistent with the patient's given clinical history. These results will be called to the ordering clinician or representative by the Radiologist Assistant, and communication documented in the PACS or zVision Dashboard. Electronically Signed   By: MInez CatalinaM.D.   On: 05/21/2019 03:50   Vas UKoreaLower Extremity  Venous (dvt) (only [facility])  Result Date: 05/21/2019  Lower Venous Study Other Indications: Patient states she had pain in leg several days ago, now                    presenting with tachycardia and chest pain radiating to the                    left shoulder, SOB, and nausea. Comparison Study: No prior study on file for comparison.  Performing Technologist: Sharion Dove RVS  Examination Guidelines: A complete evaluation includes B-mode imaging, spectral Doppler, color Doppler, and power Doppler as needed of all accessible portions of each vessel. Bilateral testing is considered an integral part of a complete examination. Limited examinations for reoccurring indications may be performed as noted.  +-----+---------------+---------+-----------+----------+-------+  RIGHT Compressibility Phasicity Spontaneity Properties Summary  +-----+---------------+---------+-----------+----------+-------+  CFV   Full            Yes       Yes                             +-----+---------------+---------+-----------+----------+-------+   +---------+---------------+---------+-----------+----------+-------+  LEFT      Compressibility Phasicity Spontaneity Properties Summary  +---------+---------------+---------+-----------+----------+-------+  CFV       Full            Yes       Yes                             +---------+---------------+---------+-----------+----------+-------+  SFJ       Full                                                      +---------+---------------+---------+-----------+----------+-------+  FV Prox   Full                                                      +---------+---------------+---------+-----------+----------+-------+  FV Mid    Full                                                      +---------+---------------+---------+-----------+----------+-------+  FV Distal Full                                                      +---------+---------------+---------+-----------+----------+-------+  PFV       Full                                                       +---------+---------------+---------+-----------+----------+-------+  POP       Full            Yes       Yes                             +---------+---------------+---------+-----------+----------+-------+  PTV       Full                                                      +---------+---------------+---------+-----------+----------+-------+  PERO      Full                                                      +---------+---------------+---------+-----------+----------+-------+     Summary: Right: No evidence of common femoral vein obstruction. Left: There is no evidence of deep vein thrombosis in the lower extremity.  *See table(s) above for measurements and observations. Electronically signed by Deitra Mayo MD on 05/21/2019 at 1:33:46 PM.    Final     Cardiac Studies:  ECG: ST non specific ST changes    Telemetry: ST rates in 1120 range   Echo:   Medications:    chlorhexidine gluconate (MEDLINE KIT)  15 mL Mouth Rinse BID   Chlorhexidine Gluconate Cloth  6 each Topical Daily   hydrocortisone sod succinate (SOLU-CORTEF) inj  100 mg Intravenous Q8H   mouth rinse  15 mL Mouth Rinse 10 times per day   metoprolol tartrate  2.5-5 mg Intravenous Q4H   metoprolol tartrate  2.5-5 mg Intravenous Once   pantoprazole (PROTONIX) IV  40 mg Intravenous Daily   propylthiouracil  200 mg Oral Q4H   sodium chloride flush  3 mL Intravenous Q12H      sodium chloride     sodium chloride Stopped (05/21/19 2127)   azithromycin Stopped (05/21/19 2227)   cefTRIAXone (ROCEPHIN)  IV Stopped (05/21/19 2111)   fentaNYL infusion INTRAVENOUS 75 mcg/hr (05/22/19 0700)   heparin 1,400 Units/hr (05/22/19 0700)   nitroGLYCERIN Stopped (05/21/19 1016)   norepinephrine (LEVOPHED) Adult infusion     propofol (DIPRIVAN) infusion Stopped (05/21/19 0905)    Assessment/Plan:   Elevated Troponin:  No acute ECG changes 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 Atypical pneumonia On vent Continue azithromycin and rocephin. EF 25-30%  Will have right heart today Hesitant to increase beta blocker in setting of thyroid storm Until we know what CO/PCWP is Suspect we will increase beta blocker and diurese post cath   Thyroid:  TSH suppressed T4 elevated PTU, beta blocker hydrocortisone started yesterday Not compliant with her Graves meds at home Radioactive Iodine uptake study will not be accurate post cath    [facility] 05/22/2019, 7:56 AM

## 2019-05-22 NOTE — Progress Notes (Signed)
ANTICOAGULATION CONSULT NOTE - Follow Up Consult  Pharmacy Consult for Heparin  Indication: chest pain/ACS  No Known Allergies   Vital Signs: Temp: 99.1 F (37.3 C) (08/05 0746) Temp Source: Oral (08/05 0746) BP: 133/79 (08/05 0800) Pulse Rate: 115 (08/05 0800)  Ideal body weight: 63.9 kg  Heparin dosing weight: 79.7 kg  Labs: Recent Labs    05/20/19 1613  05/20/19 2115 05/21/19 0224  05/21/19 0451 05/21/19 0512 05/21/19 0959 05/21/19 1025 05/21/19 1058 05/21/19 1841 05/22/19 0633  HGB 12.4  --   --  8.4*   < > 13.3  --   --   --  12.4  --  11.2*  HCT 36.2  --   --  24.2*   < > 39.0  --   --   --  35.6*  --  32.9*  PLT 389  --   --  247  --   --   --   --   --  365  --  309  APTT  --   --   --  74*  --   --   --   --   --   --   --   --   LABPROT  --   --   --  20.5*  --   --   --   --  14.5  --   --   --   INR  --   --   --  1.8*  --   --   --   --  1.1  --   --   --   HEPARINUNFRC  --   --   --   --   --   --   --  0.11*  --   --  0.13* 0.30  CREATININE 0.67  --   --   --   --   --   --   --  0.62  --   --  0.50  CKTOTAL  --   --  324*  --   --   --   --   --   --   --   --   --   TROPONINIHS 57*   < > 1,330* 1,357*  --   --  6,121* 4,132*  --   --   --   --    < > = values in this interval not displayed.    Estimated Creatinine Clearance: 95.3 mL/min (by C-G formula based on SCr of 0.5 mg/dL).   Medical History: Past Medical History:  Diagnosis Date  . HTN (hypertension)   . 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 Mixed picture for NSTEMI vs. Thyroid storm vs. takotsubo syndrome. No anticoagulation PTA. Pharmacy consulted to dose heparin.  Heparin level (0.3) now therapeutic after re-bolus and increasing drip rate to 1400 units/hr. No signs of bleeding. Will increase drip rate slightly to keep therapeutic. Plan for left and right heart cath this afternoon.  Goal of Therapy:  Heparin level 0.3-0.7  units/ml Monitor platelets by anticoagulation protocol: Yes    Plan:  -Increase heparin infusion to 1500 units/hr -Daily heparin level/CBC while on heparin -F/u [facility] plan after cath  Richardine Service, PharmD PGY1 Pharmacy Resident Phone: [phone] 05/22/2019  8:35 AM  Please check [email or web address] for unit-specific pharmacy phone numbers.

## 2019-05-22 NOTE — Progress Notes (Addendum)
NAME:  Amber [name], MRN:  [id number], DOB:  11-23-1970, LOS: 2 ADMISSION DATE:  05/20/2019, CONSULTATION DATE:  8/3 REFERRING MD:  Dr. Adela Glimpse, CHIEF COMPLAINT:  Respiratory distress  Brief History   48 year old female presented with CP x 3 days. Typical chest pain by description. Troponin continued to increase in ED. Developed respiratory distress. PCCM asked to see.  Required intubation early AM 8/4.  History of present illness   48 year old female with PMH as below, which is significant for hyperthyroid on methimazole. She is a Designer, television/film set. She presented to [facility] ED on 8/3 with complaints of chest pain x 3 days. Chest pain is accompanied by L arm radiation, palpitations, and SOB. Upon arrival to ED she was tachycardiac with initial troponin of 57 with ST changes on EKG. Troponin increased to 162. Cardiology was consulted who felt this was less likely to be ACS. Her dyspnea began to worsen and she was taken for CXR, CT chest. CXR demonstrates central congestion and fluid in fissure. CT chest demonstrated bilateral GGO. O2 escalated to 4L. PCCM asked to evaluate.   Early AM hours, had worsening distress and required emergent intubation around 0400.  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 hyperthyroid.  Significant Hospital Events   8/3 admit  Consults:  Cardiology  Procedures:  ETT 8/4 >   Significant Diagnostic Tests:  CT chest 8/3 > Patchy ground-glass infiltrates are identified bilaterally predominately within the bases. Echo 8/4 > EF 25-30%, findings suggestive of takatsubo DCM. L/RHC 8/5 >   Micro Data:  COVID-19 8/3 > Neg COVID-19 8/3 > Sputum 8/4 >   Antimicrobials:  Ceftriaxone 8/3 > Azithromycin 8/3 >  Interim history/subjective:  R/LHC planned for today.  No additional acute events.  Objective   Blood pressure 133/79, pulse (!) 115, temperature 99.1 F (37.3 C), temperature  source Oral, resp. rate (!) 22, height 5\' 8"  (1.727 m), weight 79.7 kg, last menstrual period 06/19/2017, SpO2 100 %.    Vent Mode: PRVC FiO2 (%):  [40 %-50 %] 40 % Set Rate:  [22 bmp] 22 bmp Vt Set:  [510 mL] 510 mL PEEP:  [8 cmH20-10 cmH20] 8 cmH20 Plateau Pressure:  [24 cmH20-35 cmH20] 24 cmH20   Intake/Output Summary (Last 24 hours) at 05/22/2019 0820 Last data filed at 05/22/2019 0800 Gross per 24 hour  Intake 1342.06 ml  Output 1225 ml  Net 117.06 ml   Filed Weights   05/21/19 0500  Weight: 79.7 kg    Examination: General: middle aged female, in NAD HENT: [place]/AT, PERRL, ETT in place Lungs: CTAB Cardiovascular: Tachy, regular, no MRG Abdomen: Soft, non-tender, non-distended Extremities: warm, no edema.  Neuro: Awake, nods head appropriately.   Assessment & Plan:   NSTEMI: Non-specific ST changes on initial EKG. Troponin up-trending from 57 >1330>1357>6121.   Chest pain is described as typical in nature.  - Cardiology following, planning for [facility] today 8/5 - Continue heparin infusion, ASA  Acute hypoxemic respiratory failure - presumed CAP + pulmonary edema.  PCT normal.  ? Viral PNA, initial COVID neg, repeat pending.  Required emergent intubation AM 8/4. - Continue full vent support - Hold SBT / extubation until after cath - Continue empiric CAP coverage - Diuresis as able - Follow CXR  Hyperthyroidism: TSH undetectably low on admission, T4 upper limit normal - doubt thyroid storm. - Continue PTU and hydrocortisone for now, can likely d/c after cath -  Hold preadmission methimazole  Hypertension:  - holding home antihypertensives for now with BP slowly decreasing   Hypomagnesemia. - 2g Mag  Best practice:  Diet: NPO Pain/Anxiety/Delirium protocol (if indicated): Fentanyl gtt.  RASS goal -1. VAP protocol (if indicated): In place DVT prophylaxis: heparin infusion GI prophylaxis: PPI Glucose control: NA Mobility: BR Code Status: FULL Family Communication:  Son updated by cardiology Disposition: ICU   Critical care time: 35 mins.    Montey Hora, [place] [facility] Pager: [phone].  If no answer, [phone] 05/22/2019, 8:20 AM  Attending Note:  48 year old female with extensive cardiac history who presents with atypical chest pain and elevated troponin.  No events overnight.  Awake and interactive, moving all ext to commands with coarse BS diffusely.  I reviewed CXR myself, ETT is in a good position.  Discussed with PCCM-NP.  Will send to the cath lab today for a right and left heart cath.  Pending results will consider weaning for extubation.  Continue rocephin and zithromax.  F/U on culture.  Will reevaluate post cath for extubation.  The patient is critically ill with multiple organ systems failure and requires high complexity decision making for assessment and support, frequent evaluation and titration of therapies, application of advanced monitoring technologies and extensive interpretation of multiple databases.   Critical Care Time devoted to patient care services described in this note is  33  Minutes. This time reflects time of care of this signee Dr Jennet Maduro. This critical care time does not reflect procedure time, or teaching time or supervisory time of PA/NP/Med student/Med Resident etc but could involve care discussion time.  Rush Farmer, M.D. [facility] Pulmonary/Critical Care Medicine. Pager: [phone]. After hours pager: [phone].

## 2019-05-22 NOTE — Progress Notes (Signed)
Pt on 4L [place] comfortable and sp02 97%.  Bipap order PRN, BiPAP on standby in pt's room if needed. Will continue to monitor.

## 2019-05-22 NOTE — Interval H&P Note (Signed)
History and Physical Interval Note:  05/22/2019 8:57 AM  Amber [name]  has presented today for surgery, with the diagnosis of heart failure - cardiomyopathy.  The various methods of treatment have been discussed with the patient and family. After consideration of risks, benefits and other options for treatment, the patient has consented to  Procedure(s): RIGHT/LEFT HEART CATH AND CORONARY ANGIOGRAPHY (N/A) as a surgical intervention.  The patient's history has been reviewed, patient examined, no change in status, stable for surgery.  I have reviewed the patient's chart and labs.  Questions were answered to the patient's satisfaction.     [name]

## 2019-05-22 NOTE — Progress Notes (Signed)
Patient extubated doing well Still with basilar rales Cath with occluded OM and stented LAD BP soft urinating well got one dose lasix earlier Will d/c iv lopressor and use Lopressor 25 bid Daily lasix in am as lungs still need drying out F/U CXR in am  D/c heparin  ON DAT for stent BP too soft right now for ARB/Entresto  [employer]

## 2019-05-22 NOTE — H&P (View-Only) (Signed)
° ° °Subjective:  ° °Intubated Understands conversations alert  ° °Objective:  °Vitals:  ° 05/22/19 0625 05/22/19 0700 05/22/19 0746 05/22/19 0750  °BP: 125/77 117/75    °Pulse: (!) 129 (!) 117    °Resp: (!) 25 (!) 23    °Temp:   99.1 °F (37.3 °C)   °TempSrc:   Oral   °SpO2: 100% 100%  100%  °Weight:      °Height:      ° ° °Intake/Output from previous day: ° °Intake/Output Summary (Last 24 hours) at 05/22/2019 0756 °Last data filed at 05/22/2019 0700 °Gross per 24 hour  °Intake 1327.3 ml  °Output 1160 ml  °Net 167.3 ml  ° ° °Physical Exam: ° °Affect appropriate °Chronically ill black female  °HEENT:exopthalmus  °Neck supple with no adenopathy °JVP normal no bruits no thyromegaly °Lungs clear with no wheezing and good diaphragmatic motion °Heart:  S1/S2 no murmur, no rub, gallop or click °PMI normal °Abdomen: benighn, BS positve, no tenderness, no AAA °no bruit.  No HSM or HJR °Distal pulses intact with no bruits °No edema °Neuro non-focal °Skin warm and dry °No muscular weakness ° ° °Lab Results: °Basic Metabolic Panel: °Recent Labs  °  05/20/19 °1613 05/20/19 °2115  05/21/19 °0451 05/21/19 °1025  °NA 139  --    < > 140 140  °K 3.2*  --    < > 3.6 4.1  °CL 104  --   --   --  106  °CO2 20*  --   --   --  18*  °GLUCOSE 144*  --   --   --  139*  °BUN 11  --   --   --  15  °CREATININE 0.67  --   --   --  0.62  °CALCIUM 9.4  --   --   --  8.9  °MG  --  1.7  --   --   --   °PHOS  --  4.0  --   --   --   ° < > = values in this interval not displayed.  ° °Liver Function Tests: °Recent Labs  °  05/20/19 °2115  °AST 35  °ALT 15  °ALKPHOS 114  °BILITOT 0.4  °PROT 7.1  °ALBUMIN 3.3*  ° °No results for input(s): LIPASE, AMYLASE in the last 72 hours. °CBC: °Recent Labs  °  05/21/19 °0224  05/21/19 °1058 05/22/19 °0633  °WBC 18.5*  --  33.4* 25.8*  °NEUTROABS 14.7*  --   --  PENDING  °HGB 8.4*   < > 12.4 11.2*  °HCT 24.2*   < > 35.6* 32.9*  °MCV 72.9*  --  70.8* 72.3*  °PLT 247  --  365 309  ° < > = values in this interval not  displayed.  ° °Cardiac Enzymes: °Recent Labs  °  05/20/19 °2115  °CKTOTAL 324*  ° °BNP: °Invalid input(s): POCBNP °D-Dimer: °Recent Labs  °  05/20/19 °1746  °DDIMER <0.27  ° °Hemoglobin A1C: °No results for input(s): HGBA1C in the last 72 hours. °Fasting Lipid Panel: °Recent Labs  °  05/21/19 °0512  °TRIG 69  ° °Thyroid Function Tests: °Recent Labs  °  05/20/19 °1746 05/20/19 °1905  °TSH <0.010*  --   °T3FREE  --  2.1  ° °Anemia Panel: °Recent Labs  °  05/20/19 °2115  °FERRITIN 44  ° ° °Imaging: °Dg Chest 2 View ° °Result Date: 05/20/2019 °CLINICAL DATA:  Chest pain and shortness of breath for   for 3 days EXAM: CHEST - 2 VIEW COMPARISON:  Radiograph 01/01/2015, 01/17/2013 FINDINGS: Patchy areas of consolidation with few air bronchograms are present in right lower lobe and retrocardiac space. No pneumothorax or effusion. Cardiomediastinal contours are normal. No acute osseous or soft tissue abnormality. IMPRESSION: Findings could reflect multifocal pneumonia in the appropriate setting. Electronically Signed   By: Lovena Le M.D.   On: 05/20/2019 17:37   Dg Abd 1 View  Result Date: 05/21/2019 CLINICAL DATA:  NG tube placement EXAM: ABDOMEN - 1 VIEW COMPARISON:  None. FINDINGS: The tip of the NG tube is seen projecting over the distal stomach. The side hole is seen projecting over the mid stomach. Air seen in nondilated loops of bowel. IMPRESSION: Tip of NG tube seen projecting over the stomach. Electronically Signed   By: Prudencio Pair M.D.   On: 05/21/2019 10:19   Ct Chest W Contrast  Result Date: 05/20/2019 CLINICAL DATA:  Abnormal chest x-ray EXAM: CT CHEST WITH CONTRAST TECHNIQUE: Multidetector CT imaging of the chest was performed during intravenous contrast administration. CONTRAST:  30m OMNIPAQUE IOHEXOL 300 MG/ML  SOLN COMPARISON:  Chest x-ray from earlier in the same day. FINDINGS: Cardiovascular: Thoracic aorta and its branches are within normal limits. No aneurysmal dilatation or dissection is seen.  Moderate coronary calcifications are noted. No significant cardiac enlargement is noted. No large central pulmonary embolus is seen. Mediastinum/Nodes: Thoracic inlet is within normal limits. The esophagus as visualized is within normal limits. No hilar or mediastinal adenopathy is seen. Lungs/Pleura: The lungs are well aerated bilaterally. Patchy ground-glass infiltrates are identified bilaterally predominately within the bases. No associated effusion or pneumothorax is seen. Scattered calcified granulomas are noted. Upper Abdomen: The visualized upper abdomen is within normal limits. Musculoskeletal: No acute bony abnormality is seen. IMPRESSION: There are a spectrum of findings in the lungs which can be seen with acute atypical infection (as well as other non-infectious etiologies). In particular, viral pneumonia (including COVID-19) should be considered in the appropriate clinical setting. Electronically Signed   By: MInez CatalinaM.D.   On: 05/20/2019 20:21   Portable Chest X-ray  Result Date: 05/21/2019 CLINICAL DATA:  Check endotracheal tube placement EXAM: PORTABLE CHEST 1 VIEW COMPARISON:  Film from previous day FINDINGS: Endotracheal tube is noted with the tip in the right mainstem bronchus. This should be withdrawn at least 4 cm. Increasing bilateral infiltrates are noted similar to that seen on recent CT examination left worse than right. Portion of this may be related to the inadvertent intubation of the right mainstem bronchus. Bony structures are within normal limits. IMPRESSION: Right mainstem bronchus intubation. This should be withdrawn approximately 4 cm. Diffuse bilateral infiltrates consistent with the patient's given clinical history. These results will be called to the ordering clinician or representative by the Radiologist Assistant, and communication documented in the PACS or zVision Dashboard. Electronically Signed   By: MInez CatalinaM.D.   On: 05/21/2019 03:50   Vas [place]Lower Extremity  Venous (dvt) (only [facility])  Result Date: 05/21/2019  Lower Venous Study Other Indications: Patient states she had pain in leg several days ago, now                    presenting with tachycardia and chest pain radiating to the                    left shoulder, SOB, and nausea. Comparison Study: No prior study on file for comparison.  Technologist: Candace Kanady RVS  Examination Guidelines: A complete evaluation includes B-mode imaging, spectral Doppler, color Doppler, and power Doppler as needed of all accessible portions of each vessel. Bilateral testing is considered an integral part of a complete examination. Limited examinations for reoccurring indications may be performed as noted.  +-----+---------------+---------+-----------+----------+-------+  RIGHT Compressibility Phasicity Spontaneity Properties Summary  +-----+---------------+---------+-----------+----------+-------+  CFV   Full            Yes       Yes                             +-----+---------------+---------+-----------+----------+-------+   +---------+---------------+---------+-----------+----------+-------+  LEFT      Compressibility Phasicity Spontaneity Properties Summary  +---------+---------------+---------+-----------+----------+-------+  CFV       Full            Yes       Yes                             +---------+---------------+---------+-----------+----------+-------+  SFJ       Full                                                      +---------+---------------+---------+-----------+----------+-------+  FV Prox   Full                                                      +---------+---------------+---------+-----------+----------+-------+  FV Mid    Full                                                      +---------+---------------+---------+-----------+----------+-------+  FV Distal Full                                                      +---------+---------------+---------+-----------+----------+-------+  PFV       Full                                                       +---------+---------------+---------+-----------+----------+-------+  POP       Full            Yes       Yes                             +---------+---------------+---------+-----------+----------+-------+  PTV       Full                                                      +---------+---------------+---------+-----------+----------+-------+    PERO      Full                                                      +---------+---------------+---------+-----------+----------+-------+     Summary: Right: No evidence of common femoral vein obstruction. Left: There is no evidence of deep vein thrombosis in the lower extremity.  *See table(s) above for measurements and observations. Electronically signed by Deitra Mayo MD on 05/21/2019 at 1:33:46 PM.    Final     Cardiac Studies:  ECG: ST non specific ST changes    Telemetry: ST rates in 1120 range   Echo:   Medications:    chlorhexidine gluconate (MEDLINE KIT)  15 mL Mouth Rinse BID   Chlorhexidine Gluconate Cloth  6 each Topical Daily   hydrocortisone sod succinate (SOLU-CORTEF) inj  100 mg Intravenous Q8H   mouth rinse  15 mL Mouth Rinse 10 times per day   metoprolol tartrate  2.5-5 mg Intravenous Q4H   metoprolol tartrate  2.5-5 mg Intravenous Once   pantoprazole (PROTONIX) IV  40 mg Intravenous Daily   propylthiouracil  200 mg Oral Q4H   sodium chloride flush  3 mL Intravenous Q12H      sodium chloride     sodium chloride Stopped (05/21/19 2127)   azithromycin Stopped (05/21/19 2227)   cefTRIAXone (ROCEPHIN)  IV Stopped (05/21/19 2111)   fentaNYL infusion INTRAVENOUS 75 mcg/hr (05/22/19 0700)   heparin 1,400 Units/hr (05/22/19 0700)   nitroGLYCERIN Stopped (05/21/19 1016)   norepinephrine (LEVOPHED) Adult infusion     propofol (DIPRIVAN) infusion Stopped (05/21/19 0905)    Assessment/Plan:   Elevated Troponin:  No acute ECG changes 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 Atypical pneumonia On vent Continue azithromycin and rocephin. EF 25-30%  Will have right heart today Hesitant to increase beta blocker in setting of thyroid storm Until we know what CO/PCWP is Suspect we will increase beta blocker and diurese post cath   Thyroid:  TSH suppressed T4 elevated PTU, beta blocker hydrocortisone started yesterday Not compliant with her Graves meds at home Radioactive Iodine uptake study will not be accurate post cath    [facility] 05/22/2019, 7:56 AM

## 2019-05-22 NOTE — Progress Notes (Signed)
Pt has returned from cath lab.

## 2019-05-22 NOTE — Progress Notes (Signed)
Pt taken to the cath lab escorted by this RN and Antony Madura with respiratory.

## 2019-05-22 NOTE — Progress Notes (Addendum)
Pt had passed RN bedside swallow evaluation performed at 1600. Later however, when it was time for this RN went to administer ordered PO meds (1700) the patient began coughing after swallowing meds. Bedside SLP ordered.

## 2019-05-22 NOTE — Progress Notes (Addendum)
PCCM Interval Progress Note  Tolerating SBT well on PSV 5/5 for the past 2 hours following L/RHC.  Following all commands. Voided only 1ml past 1 - 2 hours.  Hold further lasix following contrast from cath. Proceed with extubation. BiPAP PRN.   Montey Hora, [place] [facility] Pager: [phone].  If no answer, [phone] 05/22/2019, 1:18 PM

## 2019-05-23 ENCOUNTER — Inpatient Hospital Stay (HOSPITAL_COMMUNITY): Payer: Commercial Managed Care - PPO

## 2019-05-23 DIAGNOSIS — I10 Essential (primary) hypertension: Secondary | ICD-10-CM

## 2019-05-23 LAB — BASIC METABOLIC PANEL
Anion gap: 11 (ref 5–15)
BUN: 18 mg/dL (ref 6–20)
CO2: 24 mmol/L (ref 22–32)
Calcium: 8.9 mg/dL (ref 8.9–10.3)
Chloride: 105 mmol/L (ref 98–111)
Creatinine, Ser: 0.5 mg/dL (ref 0.44–1.00)
GFR calc Af Amer: >60 mL/min (ref >60)
GFR calc non Af Amer: >60 mL/min (ref >60)
Glucose, Bld: 135 mg/dL — ABNORMAL HIGH (ref 70–99)
Potassium: 3.5 mmol/L (ref 3.5–5.1)
Sodium: 140 mmol/L (ref 135–145)

## 2019-05-23 LAB — CBC
HCT: 27.4 % — ABNORMAL LOW (ref 36.0–46.0)
Hemoglobin: 9.4 g/dL — ABNORMAL LOW (ref 12.0–15.0)
MCH: 24.5 pg — ABNORMAL LOW (ref 26.0–34.0)
MCHC: 34.3 g/dL (ref 30.0–36.0)
MCV: 71.5 fL — ABNORMAL LOW (ref 80.0–100.0)
Platelets: 298 10*3/uL (ref 150–400)
RBC: 3.83 MIL/uL — ABNORMAL LOW (ref 3.87–5.11)
RDW: 15.7 % — ABNORMAL HIGH (ref 11.5–15.5)
WBC: 16.7 10*3/uL — ABNORMAL HIGH (ref 4.0–10.5)
nRBC: 0 % (ref 0.0–0.2)

## 2019-05-23 LAB — CULTURE, RESPIRATORY W GRAM STAIN: Culture: NORMAL

## 2019-05-23 LAB — PHOSPHORUS: Phosphorus: 3 mg/dL (ref 2.5–4.6)

## 2019-05-23 LAB — MAGNESIUM: Magnesium: 2.1 mg/dL (ref 1.7–2.4)

## 2019-05-23 MED ORDER — POTASSIUM CHLORIDE CRYS ER 20 MEQ PO TBCR
40.0000 meq | EXTENDED_RELEASE_TABLET | Freq: Once | ORAL | Status: AC
  Administered 2019-05-23: 40 meq via ORAL
  Filled 2019-05-23: qty 2

## 2019-05-23 MED ORDER — METHIMAZOLE 10 MG PO TABS
10.0000 mg | ORAL_TABLET | Freq: Two times a day (BID) | ORAL | Status: DC
  Administered 2019-05-23 – 2019-05-25 (×5): 10 mg via ORAL
  Filled 2019-05-23 (×8): qty 1

## 2019-05-23 MED ORDER — POTASSIUM CHLORIDE 10 MEQ/100ML IV SOLN
10.0000 meq | INTRAVENOUS | Status: AC
  Administered 2019-05-23 (×4): 10 meq via INTRAVENOUS
  Filled 2019-05-23 (×4): qty 100

## 2019-05-23 MED ORDER — ENSURE ENLIVE PO LIQD: 237.0000 mL | Freq: Two times a day (BID) | ORAL | Status: DC

## 2019-05-23 MED ORDER — GLUCERNA SHAKE PO LIQD
237.0000 mL | Freq: Three times a day (TID) | ORAL | Status: DC
  Administered 2019-05-23 – 2019-05-25 (×5): 237 mL via ORAL

## 2019-05-23 MED ORDER — LISINOPRIL 5 MG PO TABS
5.0000 mg | ORAL_TABLET | Freq: Every day | ORAL | Status: DC
  Administered 2019-05-23 – 2019-05-25 (×3): 5 mg via ORAL
  Filled 2019-05-23 (×3): qty 1

## 2019-05-23 NOTE — Progress Notes (Signed)
CARDIAC REHAB PHASE I   PRE:  Rate/Rhythm: 84 SR    BP: sitting 125/79    SaO2: 100 2L, 96 RA  MODE:  Ambulation: 290 ft   POST:  Rate/Rhythm: 106 ST with PACs    BP: sitting 120/93     SaO2: 100 2L   Pt eager to walk but weak. Able to stand independently but used RW and 2L, assist x1. Her SaO2 was 96-100 RA however she felt better on 2L so we used it today. Talkative while walking. Return to recliner, VSS. Began discussing MI and gave stent card and videos to watch in [place]. Will bring English as a second language teacher. [place], ACSM 05/23/2019 12:00 PM

## 2019-05-23 NOTE — Progress Notes (Signed)
Late entry- pt arrived to floor, vss, 02 2l/[place], pt s/p nonstemi and stents , extubated yesterday, st on telemetry, denies cp or sob , pt able to ambulate to bed and scale weight, foley dc prior to arrival at 1700, dtr at bedside, oriented to call light and how to call for nurse and room, menu and pt guide given to pt, report that pt reads in [place]. She says she can do both and prefers spanish but can do both.

## 2019-05-23 NOTE — Evaluation (Signed)
Clinical/Bedside Swallow Evaluation Patient Details  Name: Amber [name] MRN: [id number] Date of Birth: [birth year]  Today's Date: 05/23/2019 Time: SLP Start Time (ACUTE ONLY): 7544 SLP Stop Time (ACUTE ONLY): 0850 SLP Time Calculation (min) (ACUTE ONLY): 27 min  Past Medical History:  Past Medical History:  Diagnosis Date  . HTN (hypertension)   . 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 CESAREAN SECTION     x2  . CORONARY STENT INTERVENTION N/A 05/22/2019   Procedure: CORONARY STENT INTERVENTION;  Surgeon: Wellington Hampshire, MD;  Location: [facility];  Service: Cardiovascular;  Laterality: N/A;  . MASS EXCISION Right 01/18/2013   Procedure: EXCISION right scapular cyst;  Surgeon: Ralene Ok, MD;  Location: [facility];  Service: General;  Laterality: Right;  . RIGHT/LEFT HEART CATH AND CORONARY ANGIOGRAPHY N/A 05/22/2019   Procedure: RIGHT/LEFT HEART CATH AND CORONARY ANGIOGRAPHY;  Surgeon: Wellington Hampshire, MD;  Location: [facility];  Service: Cardiovascular;  Laterality: N/A;   HPI:  Amber [name], 47y/f presented to ED central chest pain, palpitations and shortness of breath. Recent diagnosis of hyperthyroidism, essential hypertensiona nd anemia. She required intubation early on 05/21/19 and extubated 05/22/19 around 13:30. Bilateral PNA, heart cath performed. No prior swallowing issues. She failed the [facility] swallow screening upon extubation that prompted a BSE order.    Assessment / Plan / Recommendation Clinical Impression  Patient was extubated 8/5 at 13:30 after only a 1 1/2 day intubation. She failed the [facility] Swallowing Screen after extubation. Patient was sitting in the chair, alert and oriented. English is her second language, however her english was more than adequate for this evaluation. She reported coughing with water yesterday and was aware of why she needed a swallow evaluation. Oral  motor/neuro exam was [facility] for coordination, strength and sensory. Pt presented with ice chips, water sips from cup and straw, puree, and cracker. She had only one delayed cough response to water from the cup, but all sequencial trials were tolerated without throat clearing, coughing or changes in vocal quality. Suspect the failed swallow screen may have been too soon after intubation. Recommend pt begin a Regular diet with thin liquids. Medications whole in puree or with liquid per pt's preference. ST to sign off at this time. No further therapy recommended at this time.  SLP Visit Diagnosis: Dysphagia, unspecified (R13.10)    Aspiration Risk  No limitations    Diet Recommendation Regular;Thin liquid   Liquid Administration via: Straw Medication Administration: Whole meds with liquid Supervision: Patient able to self feed Postural Changes: Seated upright at 90 degrees    Other  Recommendations Oral Care Recommendations: Oral care BID   Follow up Recommendations None                  Prognosis Prognosis for Safe Diet Advancement: Good      Swallow Study   General Date of Onset: 05/20/19 HPI: Amber [name], 47y/f presented to ED central chest pain, palpitations and shortness of breath. Recent diagnosis of hyperthyroidism, essential hypertensiona nd anemia. She required intubation early on 05/21/19 and extubated 05/22/19 around 13:30. Bilateral PNA, heart cath performed. No prior swallowing issues. She failed the [facility] swallow screening upon extubation that prompted a BSE order.  Type of Study: Bedside Swallow Evaluation Previous Swallow Assessment: none Diet Prior to this Study: NPO Temperature Spikes Noted: No Respiratory Status: Room air History of Recent Intubation: Yes Length  of Intubations (days): 2 days Date extubated: 02/19/19 Behavior/Cognition: Alert;Cooperative;Pleasant mood Oral Cavity Assessment: Within Functional Limits Oral Care Completed by SLP: Recent completion by  staff Oral Cavity - Dentition: Adequate natural dentition Vision: Functional for self-feeding Self-Feeding Abilities: Able to feed self Patient Positioning: Upright in chair Baseline Vocal Quality: Normal Volitional Cough: Strong Volitional Swallow: Able to elicit    Oral/Motor/Sensory Function     Ice Chips Ice chips: Within functional limits Presentation: Spoon   Thin Liquid Thin Liquid: Within functional limits Presentation: Cup;Straw    Nectar Thick Nectar Thick Liquid: Not tested   Honey Thick Honey Thick Liquid: Not tested   Puree Puree: Within functional limits Presentation: Spoon   Solid     Solid: Within functional limits Presentation: Self Fed     Lindalou Hose Lawerance Matsuo, MA, CCC-SLP 05/23/2019 10:01 AM

## 2019-05-23 NOTE — Progress Notes (Signed)
Subjective:   Sitting in chair looks better   Objective:  Vitals:   05/23/19 0500 05/23/19 0600 05/23/19 0744 05/23/19 0800  BP: 126/71 121/74  (!) 139/106  Pulse: 98 96  (!) 102  Resp: (!) 31 (!) 28  (!) 25  Temp:   98.6 F (37 C)   TempSrc:   Oral   SpO2: 96% 98%  99%  Weight:      Height:        Intake/Output from previous day:  Intake/Output Summary (Last 24 hours) at 05/23/2019 0816 Last data filed at 05/23/2019 0800 Gross per 24 hour  Intake 728.64 ml  Output 2015 ml  Net -1286.36 ml    Physical Exam:  Basilar rales bilateral  No murmur Abdomen soft No edema Right radial cath site A   Lab Results: Basic Metabolic Panel: Recent Labs    05/22/19 0633 05/22/19 0938 05/23/19 0328  NA 140 133*  141 140  K 3.6 3.2*  3.3* 3.5  CL 105  --  105  CO2 23  --  24  GLUCOSE 143*  --  135*  BUN 16  --  18  CREATININE 0.50  --  0.50  CALCIUM 9.3  --  8.9  MG 2.1  --  2.1  PHOS 3.9  --  3.0   Liver Function Tests: Recent Labs    05/20/19 2115  AST 35  ALT 15  ALKPHOS 114  BILITOT 0.4  PROT 7.1  ALBUMIN 3.3*   No results for input(s): LIPASE, AMYLASE in the last 72 hours. CBC: Recent Labs    05/21/19 0224  05/22/19 0633 05/22/19 0938 05/23/19 0328  WBC 18.5*   < > 25.8*  --  16.7*  NEUTROABS 14.7*  --  17.9*  --   --   HGB 8.4*   < > 11.2* 10.5*  10.2* 9.4*  HCT 24.2*   < > 32.9* 31.0*  30.0* 27.4*  MCV 72.9*   < > 72.3*  --  71.5*  PLT 247   < > 309  --  298   < > = values in this interval not displayed.   Cardiac Enzymes: Recent Labs    05/20/19 2115  CKTOTAL 324*   BNP: Invalid input(s): POCBNP D-Dimer: Recent Labs    05/20/19 1746  DDIMER <0.27   Hemoglobin A1C: No results for input(s): HGBA1C in the last 72 hours. Fasting Lipid Panel: Recent Labs    05/21/19 0512  TRIG 69   Thyroid Function Tests: Recent Labs    05/20/19 1746 05/20/19 1905  TSH <0.010*  --   T3FREE  --  2.1   Anemia Panel: Recent Labs   05/20/19 2115  FERRITIN 44    Imaging: Dg Abd 1 View  Result Date: 05/21/2019 CLINICAL DATA:  NG tube placement EXAM: ABDOMEN - 1 VIEW COMPARISON:  None. FINDINGS: The tip of the NG tube is seen projecting over the distal stomach. The side hole is seen projecting over the mid stomach. Air seen in nondilated loops of bowel. IMPRESSION: Tip of NG tube seen projecting over the stomach. Electronically Signed   By: Prudencio Pair M.D.   On: 05/21/2019 10:19   Dg Chest Port 1 View  Result Date: 05/22/2019 CLINICAL DATA:  Intubation.  Respiratory failure. EXAM: PORTABLE CHEST 1 VIEW COMPARISON:  05/21/2019. FINDINGS: Endotracheal tube has been withdrawn and its tip is now 4 cm above the carina in good anatomic position. NG tube noted with tip below  left hemidiaphragm. Cardiomegaly. Diffuse bilateral pulmonary infiltrates/edema again noted, improved from prior exam. Small left pleural effusion cannot be excluded. No pneumothorax. IMPRESSION: 1. Endotracheal tube has been withdrawn and its tip is now 4 cm above the carina in good anatomic position. NG tube noted with tip below left hemidiaphragm. 2. Cardiomegaly. Diffuse bilateral pulmonary infiltrates/edema again noted, improved from prior exam. Small left pleural effusion cannot be excluded. Electronically Signed   By: Maisie Fus  Register   On: 05/22/2019 08:06    Cardiac Studies:  ECG: ST non specific ST changes    Telemetry: ST rates in 1120 range   Echo:   Medications:   . aspirin  81 mg Oral Daily  . atorvastatin  80 mg Oral q1800  . Chlorhexidine Gluconate Cloth  6 each Topical Daily  . enoxaparin (LOVENOX) injection  40 mg Subcutaneous Q24H  . furosemide  40 mg Intravenous Daily  . mouth rinse  15 mL Mouth Rinse BID  . methimazole  10 mg Oral BID  . metoprolol tartrate  25 mg Oral BID  . potassium chloride  40 mEq Oral Once  . sodium chloride flush  3 mL Intravenous Q12H  . sodium chloride flush  3 mL Intravenous Q12H  . ticagrelor  90 mg  Oral BID     . sodium chloride    . nitroGLYCERIN Stopped (05/21/19 1016)  . potassium chloride 10 mEq (05/23/19 0737)    Assessment/Plan:   SEMI:  Post stenting of mid LAD and occluded OM EF 25-30% continue DAT and beta blocker  CHF:  BNP was only 137 and PCWP at cath 12 Low EF as above BP better so will start low dose ACE.  Continue once daily lasix good response supplement K   Thyroid:  TSH suppressed T4 elevated PTU, beta blocker hydrocortisone started Not compliant with her Graves meds at home Radioactive Iodine uptake study will not be accurate post cath    Charlton Haws 05/23/2019, 8:16 AM

## 2019-05-23 NOTE — Progress Notes (Signed)
NAME:  Amber [name], MRN:  [id number], DOB:  [birth year], LOS: 3 ADMISSION DATE:  05/20/2019, CONSULTATION DATE:  8/3 REFERRING MD:  Dr. Roel Cluck, CHIEF COMPLAINT:  Respiratory distress  Brief History   48 year old female presented with CP x 3 days. Typical chest pain by description. Troponin continued to increase in ED. Developed respiratory distress. PCCM asked to see.  Required intubation early AM 8/4. Troponin climb. 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 Extubated later that afternoon.   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 hyperthyroid.  Significant Hospital Events   8/3 admit  Consults:  Cardiology  Procedures:  ETT 8/4 > 8/5  Significant Diagnostic Tests:  CT chest 8/3 > Patchy ground-glass infiltrates are identified bilaterally predominately within the bases. Echo 8/4 > EF 25-30%, findings suggestive of takatsubo DCM,  L/RHC 8/5 > 1st Mrg lesion is 100% stenosed, 2nd Mrg lesion is 100% stenosed. Mid LAD lesion is 80% stenosed. Other areas of non-critical stenosis.   Micro Data:  COVID-19 8/3 > Neg COVID-19 8/3 > Sputum 8/4 >    Antimicrobials:  Ceftriaxone 8/3 > 8/6 Azithromycin 8/3 > 8/6  Interim history/subjective:  Extubated yesterday and tolerating well.   Objective   Blood pressure 121/74, pulse 96, temperature 98.6 F (37 C), temperature source Oral, resp. rate (!) 28, height 5\' 8"  (1.727 m), weight 79.7 kg, last menstrual period 06/19/2017, SpO2 98 %.    Vent Mode: CPAP;PSV FiO2 (%):  [30 %-40 %] 30 % PEEP:  [5 cmH20] 5 cmH20 Pressure Support:  [5 cmH20] 5 cmH20   Intake/Output Summary (Last 24 hours) at 05/23/2019 0754 Last data filed at 05/23/2019 1540 Gross per 24 hour  Intake 702.68 ml  Output 1955 ml  Net -1252.32 ml   Filed Weights   05/21/19 0500  Weight: 79.7 kg    Examination: General: middle aged female, in NAD HENT: [place]/AT, PERRL, ETT in place  Lungs: Bibasilar crackles Cardiovascular: RRR, no MRG Abdomen: Soft, non-tender, non-distended Extremities: warm, no edema Neuro: Awake, nods head appropriately   Assessment & Plan:   NSTEMI: Non-specific ST changes on initial EKG. Troponin up-trending from 57 >1330>1357>6121.   Chest pain is described as typical in nature. LHC on 8/6 demonstrated occluded OM 1,2. 80% LAD. PCI to LAD.  - Plan per cardiology - Brilinta  Acute hypoxemic respiratory failure - presumed CAP +/- pulmonary edema initially.  PCT normal. COVID neg, repeat pending.  Given above cardiac disease it is more likely this solely represents pulmonary edema. Extubated 8/5. - Supplemental O2 - PRN BiPAP - Diuresis as tolerated. Continued lasix today.   Hyperthyroidism: TSH undetectably low on admission, T4 upper limit normal - doubt thyroid storm. Was changed to PTU on admission.  - DC PTU, Steroids. Restart home methimazole at some dose based on T4. Will need to follow up with endocrinology as an outpatient.   Hypertension:  - will defer Anti-HTN medications to cardiology.  - Currently on lisinopril, metoprolol   Hypokalemia: - Supplement K  Best practice:  Diet: NPO Pain/Anxiety/Delirium protocol (if indicated):NA VAP protocol (if indicated): NA DVT prophylaxis: enoxaparin GI prophylaxis: PPI Glucose control: NA Mobility:Up ad lib with assist.  Code Status: FULL Family Communication: Patient updated.  Disposition: tx to telemetry, TRH to assume care as discussed with Dr. Raelyn Mora.    Critical care time:     Georgann Housekeeper, [facility] [place] Pager [phone]  or [phone]  05/23/2019 8:29 AM  Attending Note:  48 year old female with hyperthyroidism history presenting with an MI and VDRF.  Patient was extubated on 8/5 after cath and is doing well with no events overnight.  On exam, lungs are clear and patient is alert and interactive.  I reviewed CXR myself, pulmonary  edema noted.  Discussed with PCCM-NP and TRH-MD.  VDRF:  - D/C BiPAP  Hypoxemia:  - Titrate O2 for sat of 88-92, hopefully to off today  Hyperthyroidism:  - Transition to home methimazole  - D/C steroids  HTN:  - Lisinopril and lopressor as ordered  - Cards following  Transfer out of the ICU and to [facility] service with PCCM off 8/7  Patient seen and examined, agree with above note.  I dictated the care and orders written for this patient under my direction.  Alyson Reedy, MD [phone]

## 2019-05-23 NOTE — Progress Notes (Signed)
EKG CRITICAL VALUE     12 lead EKG performed.  Critical value noted.  Marzella Schlein , RN notified.   Marquitta Persichetti, CCT 05/23/2019 7:47 AM

## 2019-05-23 NOTE — Progress Notes (Signed)
eLink Physician-Brief Progress Note Patient Name: Amber [name] DOB: [birth year] MRN: [id number]   Date of Service  05/23/2019  HPI/Events of Note  hypokalemia  eICU Interventions  Potassium replaced     Intervention Category Intermediate Interventions: Electrolyte abnormality - evaluation and management  Aleksey Newbern 05/23/2019, 6:21 AM

## 2019-05-23 NOTE — Progress Notes (Addendum)
Nutrition Follow-up  DOCUMENTATION CODES:   Not applicable  INTERVENTION:   - Glucerna Shake po TID, each supplement provides 220 kcal and 10 grams of protein (per pt preference)  NUTRITION DIAGNOSIS:   Inadequate oral intake related to inability to eat as evidenced by NPO status.  Progressing, pt now on a Regular diet  GOAL:   Patient will meet greater than or equal to 90% of their needs  Progressing  MONITOR:   PO intake, Supplement acceptance, Weight trends, Labs, I & O's  REASON FOR ASSESSMENT:   Ventilator    ASSESSMENT:   48 year old female who presented to the ED on 8/03 with chest pain, SOB, and nausea. PMH of HTN, HLD, anemia, recent diagnosis of hyperthyroidism. Pt admitted with NSTEMI. Pt developed acute hypoxemia respiratory failure and required intubation on 8/04.  8/05 - right and left heart cath showing 2 vessel CAD, extubated  Discussed pt with RN and during ICU rounds.  Diet advanced to Regular this morning. No new weights since admission.  Spoke with pt at bedside. Pt reports that she has a poor appetite at this time and that she was told this was normal after being intubated. RN reports pt consumed some liquids this morning at breakfast.  Pt endorses weight loss related to undiagnosed hyperthyroidism but reports that recently she has started gaining weight back.  Pt is willing to consume an oral nutrition supplement to aid in meeting kcal and protein needs.  Medications reviewed and include: Lasix, K-dur 40 mEq once, KCl 10 mEq x 4 runs  Labs reviewed: creatinine 9.4  UOP: 1955 ml x 24 hours I/O's: -1.2 L since admit  NUTRITION - FOCUSED PHYSICAL EXAM:    Most Recent Value  Orbital Region  No depletion  Upper Arm Region  No depletion  Thoracic and Lumbar Region  No depletion  Buccal Region  No depletion  Temple Region  No depletion  Clavicle Bone Region  No depletion  Clavicle and Acromion Bone Region  Mild depletion  Scapular Bone  Region  No depletion  Dorsal Hand  No depletion  Patellar Region  No depletion  Anterior Thigh Region  No depletion  Posterior Calf Region  No depletion  Edema (RD Assessment)  Mild [BLE]  Hair  Reviewed  Eyes  Reviewed  Mouth  Reviewed  Skin  Reviewed  Nails  Reviewed       Diet Order:   Diet Order            Diet regular Room service appropriate? Yes; Fluid consistency: Thin  Diet effective now              EDUCATION NEEDS:   No education needs have been identified at this time  Skin:  Skin Assessment: Reviewed RN Assessment  Last BM:  no documented BM  Height:   Ht Readings from Last 1 Encounters:  05/21/19 5\' 8"  (1.727 m)    Weight:   Wt Readings from Last 1 Encounters:  05/21/19 79.7 kg    Ideal Body Weight:  63.6 kg  BMI:  Body mass index is 26.72 kg/m.  Estimated Nutritional Needs:   Kcal:  1900-2100  Protein:  100-115 grams  Fluid:  >/= 1.8 L    Gaynell Face, MS, RD, LDN Inpatient Clinical Dietitian Pager: [phone] Weekend/After Hours: [phone]

## 2019-05-24 DIAGNOSIS — E876 Hypokalemia: Secondary | ICD-10-CM

## 2019-05-24 DIAGNOSIS — J9691 Respiratory failure, unspecified with hypoxia: Secondary | ICD-10-CM

## 2019-05-24 DIAGNOSIS — R7303 Prediabetes: Secondary | ICD-10-CM

## 2019-05-24 DIAGNOSIS — E785 Hyperlipidemia, unspecified: Secondary | ICD-10-CM

## 2019-05-24 LAB — BASIC METABOLIC PANEL
Anion gap: 11 (ref 5–15)
BUN: 24 mg/dL — ABNORMAL HIGH (ref 6–20)
CO2: 24 mmol/L (ref 22–32)
Calcium: 9.2 mg/dL (ref 8.9–10.3)
Chloride: 106 mmol/L (ref 98–111)
Creatinine, Ser: 0.55 mg/dL (ref 0.44–1.00)
GFR calc Af Amer: >60 mL/min (ref >60)
GFR calc non Af Amer: >60 mL/min (ref >60)
Glucose, Bld: 92 mg/dL (ref 70–99)
Potassium: 3.2 mmol/L — ABNORMAL LOW (ref 3.5–5.1)
Sodium: 141 mmol/L (ref 135–145)

## 2019-05-24 LAB — MAGNESIUM: Magnesium: 2 mg/dL (ref 1.7–2.4)

## 2019-05-24 LAB — CBC
HCT: 30.1 % — ABNORMAL LOW (ref 36.0–46.0)
Hemoglobin: 10.2 g/dL — ABNORMAL LOW (ref 12.0–15.0)
MCH: 24.5 pg — ABNORMAL LOW (ref 26.0–34.0)
MCHC: 33.9 g/dL (ref 30.0–36.0)
MCV: 72.2 fL — ABNORMAL LOW (ref 80.0–100.0)
Platelets: 334 10*3/uL (ref 150–400)
RBC: 4.17 MIL/uL (ref 3.87–5.11)
RDW: 15.9 % — ABNORMAL HIGH (ref 11.5–15.5)
WBC: 17.4 10*3/uL — ABNORMAL HIGH (ref 4.0–10.5)
nRBC: 0 % (ref 0.0–0.2)

## 2019-05-24 LAB — PHOSPHORUS: Phosphorus: 4.9 mg/dL — ABNORMAL HIGH (ref 2.5–4.6)

## 2019-05-24 LAB — T4, FREE: Free T4: 2.22 ng/dL — ABNORMAL HIGH (ref 0.61–1.12)

## 2019-05-24 NOTE — Progress Notes (Addendum)
CARDIAC REHAB PHASE I   PRE:  Rate/Rhythm: 65 SR    BP: sitting 112/70    SaO2: 95 2L, 98 RA  MODE:  Ambulation: 470 ft   POST:  Rate/Rhythm: 76 SR    BP: sitting 114/72     SaO2: 98 RA  Pt anxious regarding O2 and SOB. She was able to slowly walk hall with min assist and on RA. SAO2 maintained 98 RA. She is quite anxious regarding her presenting resp failure. She might need med for anxiety as it sounds like she had recently started anx med with new dx of hyperthyroidism.   Discussed ed with pt and daughter. Gave books, materials, and videos in Spanish (her preferred language for reading). Discussed MI, stents, Brilinta, low sodium diet, exercise, NTG, and CRPII. Voiced understanding and requests her referral be sent to [facility]. She understands importance of Brilinta but is in general overwhelmed by medications and will need reinforcement and more education about specific meds. Encouraged her to weigh daily to watch for fluid retention.  Pt is interested in participating in Virtual Cardiac Rehab. Pt advised that Virtual Cardiac Rehab is provided at no cost to the patient.  Checklist:  1. Pt has smart device  ie smartphone and/or ipad for downloading an app  Yes 2. Reliable internet/wifi service    Yes 3. Understands how to use their smartphone and navigate within an app.  Yes  Reviewed with pt the scheduling process for virtual cardiac rehab.  Pt verbalized understanding. [place], ACSM 05/24/2019 2:04 PM

## 2019-05-24 NOTE — Progress Notes (Signed)
Subjective:   Sitting in chair looks better   Objective:  Vitals:   05/23/19 1709 05/23/19 2034 05/24/19 0017 05/24/19 0519  BP: 116/67 117/70 (!) 95/58 120/62  Pulse: 88 87 80 71  Resp: 16 17 16 16   Temp: 99.7 F (37.6 C) 98.4 F (36.9 C) 98.4 F (36.9 C) 98.6 F (37 C)  TempSrc: Oral Oral Oral Oral  SpO2: 100% 98% 100% 100%  Weight: 82.1 kg   81.9 kg  Height: 5\' 4"  (1.626 m)       Intake/Output from previous day:  Intake/Output Summary (Last 24 hours) at 05/24/2019 0804 Last data filed at 05/24/2019 0400 Gross per 24 hour  Intake 1845.86 ml  Output 930 ml  Net 915.86 ml    Physical Exam:  Clear lungs  No murmur Abdomen soft No edema Right radial cath site A   Lab Results: Basic Metabolic Panel: Recent Labs    05/23/19 0328 05/24/19 0431  NA 140 141  K 3.5 3.2*  CL 105 106  CO2 24 24  GLUCOSE 135* 92  BUN 18 24*  CREATININE 0.50 0.55  CALCIUM 8.9 9.2  MG 2.1 2.0  PHOS 3.0 4.9*   Liver Function Tests: No results for input(s): AST, ALT, ALKPHOS, BILITOT, PROT, ALBUMIN in the last 72 hours. No results for input(s): LIPASE, AMYLASE in the last 72 hours. CBC: Recent Labs    05/22/19 0633  05/23/19 0328 05/24/19 0431  WBC 25.8*  --  16.7* 17.4*  NEUTROABS 17.9*  --   --   --   HGB 11.2*   < > 9.4* 10.2*  HCT 32.9*   < > 27.4* 30.1*  MCV 72.3*  --  71.5* 72.2*  PLT 309  --  298 334   < > = values in this interval not displayed.    Imaging: Dg Chest Port 1 View  Result Date: 05/23/2019 CLINICAL DATA:  Respiratory failure EXAM: PORTABLE CHEST 1 VIEW COMPARISON:  05/22/2019 FINDINGS: Endotracheal tube and NG tube removed. Vascular congestion and bibasilar airspace disease mildly improved in the bases. No significant pleural fluid. IMPRESSION: Endotracheal tube and NG tube removed. Mild improvement in bibasilar airspace disease. Electronically Signed   By: Franchot Gallo M.D.   On: 05/23/2019 08:19    Cardiac Studies:  ECG: ST non specific ST  changes    Telemetry: ST rates in 1120 range   Echo: EF 25-30%   Medications:   . aspirin  81 mg Oral Daily  . atorvastatin  80 mg Oral q1800  . Chlorhexidine Gluconate Cloth  6 each Topical Daily  . enoxaparin (LOVENOX) injection  40 mg Subcutaneous Q24H  . feeding supplement (GLUCERNA SHAKE)  237 mL Oral TID BM  . furosemide  40 mg Intravenous Daily  . lisinopril  5 mg Oral Daily  . mouth rinse  15 mL Mouth Rinse BID  . methimazole  10 mg Oral BID  . metoprolol tartrate  25 mg Oral BID  . sodium chloride flush  3 mL Intravenous Q12H  . sodium chloride flush  3 mL Intravenous Q12H  . ticagrelor  90 mg Oral BID     . sodium chloride    . nitroGLYCERIN Stopped (05/21/19 1016)    Assessment/Plan:   SEMI:  Post stenting of mid LAD and occluded OM EF 25-30% continue DAT and beta blocker  CHF:  BNP was only 137 and PCWP at cath 12 Low EF as above Increase ACE continue beta blocker change to  oral lasix   Thyroid:  TSH suppressed T4 elevated PTU, beta blocker hydrocortisone started Not compliant with her Graves meds at home Radioactive Iodine uptake study will not be accurate post cath   Will arrange outpatient f/u for patient in our [place] office since she lives in [place] Will sign off   Charlton Haws 05/24/2019, 8:04 AM

## 2019-05-24 NOTE — Plan of Care (Signed)
  Problem: Education: Goal: Knowledge of General Education information will improve Description: Including pain rating scale, medication(s)/side effects and non-pharmacologic comfort measures Outcome: Progressing   Problem: Health Behavior/Discharge Planning: Goal: Ability to manage health-related needs will improve Outcome: Progressing   Problem: Clinical Measurements: Goal: Ability to maintain clinical measurements within normal limits will improve Outcome: Progressing Goal: Will remain free from infection Outcome: Progressing Goal: Diagnostic test results will improve Outcome: Progressing Goal: Respiratory complications will improve Outcome: Progressing Goal: Cardiovascular complication will be avoided Outcome: Progressing   Problem: Activity: Goal: Risk for activity intolerance will decrease Outcome: Progressing   Problem: Nutrition: Goal: Adequate nutrition will be maintained Outcome: Progressing   Problem: Coping: Goal: Level of anxiety will decrease Outcome: Progressing   Problem: Elimination: Goal: Will not experience complications related to bowel motility Outcome: Progressing Goal: Will not experience complications related to urinary retention Outcome: Progressing   Problem: Pain Managment: Goal: General experience of comfort will improve Outcome: Progressing   Problem: Safety: Goal: Ability to remain free from injury will improve Outcome: Progressing   Problem: Skin Integrity: Goal: Risk for impaired skin integrity will decrease Outcome: Progressing   Problem: Activity: Goal: Ability to tolerate increased activity will improve Outcome: Progressing   Problem: Respiratory: Goal: Ability to maintain a clear airway and adequate ventilation will improve Outcome: Progressing   Problem: Role Relationship: Goal: Method of communication will improve Outcome: Progressing   Problem: Education: Goal: Understanding of cardiac disease, CV risk reduction,  and recovery process will improve Outcome: Progressing Goal: Understanding of medication regimen will improve Outcome: Progressing Goal: Individualized Educational Video(s) Outcome: Progressing   Problem: Activity: Goal: Ability to tolerate increased activity will improve Outcome: Progressing   Problem: Cardiac: Goal: Ability to achieve and maintain adequate cardiopulmonary perfusion will improve Outcome: Progressing Goal: Vascular access site(s) Level 0-1 will be maintained Outcome: Progressing   Problem: Health Behavior/Discharge Planning: Goal: Ability to safely manage health-related needs after discharge will improve Outcome: Progressing

## 2019-05-24 NOTE — Progress Notes (Signed)
PROGRESS NOTE  Amber [name] CHE:[id number] DOB: August 20, 1971 DOA: 05/20/2019 PCP: [name], NP  Brief History   48 year old female presented with CP x 3 days. Typical chest pain by description. Troponin continued to increase in ED. Developed respiratory distress. PCCM asked to see.  Required intubation early AM 8/4. Troponin climb. 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 Extubated later that afternoon.   Consultants  . Pulmonary Critical Care . Cardiac Rehab . Cardiology  Procedures  . PCT and stent placement . Mechanical Ventilation and Extubation  Antibiotics   Anti-infectives (From admission, onward)   Start     Dose/Rate Route Frequency Ordered Stop   05/20/19 2130  cefTRIAXone (ROCEPHIN) 2 g in sodium chloride 0.9 % 100 mL IVPB  Status:  Discontinued     2 g 200 mL/hr over 30 Minutes Intravenous Every 24 hours 05/20/19 2125 05/23/19 0758   05/20/19 2130  azithromycin (ZITHROMAX) 500 mg in sodium chloride 0.9 % 250 mL IVPB  Status:  Discontinued     500 mg 250 mL/hr over 60 Minutes Intravenous Every 24 hours 05/20/19 2125 05/23/19 0758    .  Subjective  The patient is resting comfortably. No new complaints.  Objective   Vitals:  Vitals:   05/24/19 0519 05/24/19 1018  BP: 120/62 119/72  Pulse: 71 72  Resp: 16   Temp: 98.6 F (37 C)   SpO2: 100%     Exam:  Constitutional:  . The patient is awake, alert, and oriented. No acute distress. Respiratory:  . No increased work of breathing. . No wheezes, rales, or rhonchi. . No tactile fremitus. Cardiovascular:  . Regular rate and rhythm . No murmurs, ectopy, or gallups. . No lateral PMI. No thrills. Abdomen:  . Abdomen is soft, non-tender, non-distended. . No hernias, masses, or organomegaly . Normoactive bowel sounds. Musculoskeletal:  . No cyanosis, clubbing, or edema Skin:  . No rashes, lesions, ulcers . palpation of skin: no induration or nodules Neurologic:  . CN 2-12 intact .  Sensation all 4 extremities intact Psychiatric:  . Mental status o Mood, affect appropriate o Orientation to person, place, time  . judgment and insight appear intact    I have personally reviewed the following:   Today's Data  . Vitals, BMP, CBC  Scheduled Meds: . aspirin  81 mg Oral Daily  . atorvastatin  80 mg Oral q1800  . Chlorhexidine Gluconate Cloth  6 each Topical Daily  . enoxaparin (LOVENOX) injection  40 mg Subcutaneous Q24H  . feeding supplement (GLUCERNA SHAKE)  237 mL Oral TID BM  . furosemide  40 mg Intravenous Daily  . lisinopril  5 mg Oral Daily  . mouth rinse  15 mL Mouth Rinse BID  . methimazole  10 mg Oral BID  . metoprolol tartrate  25 mg Oral BID  . sodium chloride flush  3 mL Intravenous Q12H  . sodium chloride flush  3 mL Intravenous Q12H  . ticagrelor  90 mg Oral BID   Continuous Infusions: . sodium chloride    . 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 Troponin up-trending from 57 >1330>1357>6121.   Chest pain is described as typical in nature. LHC on 8/6 demonstrated occluded OM 1,2. 80% LAD. PCI to LAD. DAT. I appreciate cardiology's assistance.   Acute hypoxemic respiratory failure:  Presumed to be due to CAP +/- pulmonary edema initially.  PCT normal. COVID neg, repeat pending.  Given above cardiac disease it is more likely this solely represents pulmonary edema. Extubated 8/5. The patient currently is saturating 100% on room air. Continue diuresis with lasix. Monitor volume status, creatinine and  electrolytes.  Hyperthyroidism: TSH undetectably low on admission, T4 upper limit normal - doubt thyroid storm. Was changed to PTU on admission. DC PTU, Steroids. Restart home methimazole at some dose based on T4. Will need to follow up with endocrinology as an outpatient.   Hypertension: Defer Anti-HTN medications to cardiology. The patient is currently on lisinopril and metoprolol.  Hypokalemia: Supplement K and monitor.  I have seen and examined this patient myself. I have spent 35 minutes in her evaluation and care.  DVT prophylaxis: Lovenox Code Status: Full Code Family Communication: None available Disposition Plan: tbd.   Jamaul Heist, DO [facility] Direct contact: see [email or web address]  7PM-7AM contact night coverage as above 05/24/2019, 12:20 PM  LOS: 4 days

## 2019-05-24 NOTE — Plan of Care (Signed)
  Problem: Education: Goal: Knowledge of General Education information will improve Description: Including pain rating scale, medication(s)/side effects and non-pharmacologic comfort measures Outcome: Progressing   Problem: Health Behavior/Discharge Planning: Goal: Ability to manage health-related needs will improve Outcome: Progressing   Problem: Clinical Measurements: Goal: Ability to maintain clinical measurements within normal limits will improve Outcome: Progressing Goal: Will remain free from infection Outcome: Progressing Goal: Diagnostic test results will improve Outcome: Progressing Goal: Respiratory complications will improve Outcome: Progressing Goal: Cardiovascular complication will be avoided Outcome: Progressing   Problem: Activity: Goal: Risk for activity intolerance will decrease Outcome: Progressing   Problem: Nutrition: Goal: Adequate nutrition will be maintained Outcome: Progressing   Problem: Coping: Goal: Level of anxiety will decrease Outcome: Progressing   Problem: Elimination: Goal: Will not experience complications related to bowel motility Outcome: Progressing Goal: Will not experience complications related to urinary retention Outcome: Progressing   Problem: Pain Managment: Goal: General experience of comfort will improve Outcome: Progressing   Problem: Safety: Goal: Ability to remain free from injury will improve Outcome: Progressing   Problem: Skin Integrity: Goal: Risk for impaired skin integrity will decrease Outcome: Progressing   Problem: Activity: Goal: Ability to tolerate increased activity will improve Outcome: Progressing   Problem: Respiratory: Goal: Ability to maintain a clear airway and adequate ventilation will improve Outcome: Progressing   Problem: Role Relationship: Goal: Method of communication will improve Outcome: Progressing   Problem: Education: Goal: Understanding of cardiac disease, CV risk reduction,  and recovery process will improve Outcome: Progressing Goal: Understanding of medication regimen will improve Outcome: Progressing Goal: Individualized Educational Video(s) Outcome: Progressing   Problem: Activity: Goal: Ability to tolerate increased activity will improve Outcome: Progressing   Problem: Cardiac: Goal: Ability to achieve and maintain adequate cardiopulmonary perfusion will improve Outcome: Progressing Goal: Vascular access site(s) Level 0-1 will be maintained Outcome: Progressing   Problem: Health Behavior/Discharge Planning: Goal: Ability to safely manage health-related needs after discharge will improve Outcome: Progressing   

## 2019-05-25 LAB — CBC WITH DIFFERENTIAL/PLATELET
Abs Immature Granulocytes: 0.06 10*3/uL (ref 0.00–0.07)
Basophils Absolute: 0.1 10*3/uL (ref 0.0–0.1)
Basophils Relative: 1 %
Eosinophils Absolute: 0.2 10*3/uL (ref 0.0–0.5)
Eosinophils Relative: 2 %
HCT: 28.8 % — ABNORMAL LOW (ref 36.0–46.0)
Hemoglobin: 9.9 g/dL — ABNORMAL LOW (ref 12.0–15.0)
Immature Granulocytes: 0 %
Lymphocytes Relative: 40 %
Lymphs Abs: 5.5 10*3/uL — ABNORMAL HIGH (ref 0.7–4.0)
MCH: 24.7 pg — ABNORMAL LOW (ref 26.0–34.0)
MCHC: 34.4 g/dL (ref 30.0–36.0)
MCV: 71.8 fL — ABNORMAL LOW (ref 80.0–100.0)
Monocytes Absolute: 1.1 10*3/uL — ABNORMAL HIGH (ref 0.1–1.0)
Monocytes Relative: 8 %
Neutro Abs: 6.7 10*3/uL (ref 1.7–7.7)
Neutrophils Relative %: 49 %
Platelets: 355 10*3/uL (ref 150–400)
RBC: 4.01 MIL/uL (ref 3.87–5.11)
RDW: 15.6 % — ABNORMAL HIGH (ref 11.5–15.5)
WBC: 13.6 10*3/uL — ABNORMAL HIGH (ref 4.0–10.5)
nRBC: 0 % (ref 0.0–0.2)

## 2019-05-25 LAB — BASIC METABOLIC PANEL
Anion gap: 13 (ref 5–15)
BUN: 17 mg/dL (ref 6–20)
CO2: 24 mmol/L (ref 22–32)
Calcium: 8.9 mg/dL (ref 8.9–10.3)
Chloride: 103 mmol/L (ref 98–111)
Creatinine, Ser: 0.42 mg/dL — ABNORMAL LOW (ref 0.44–1.00)
GFR calc Af Amer: >60 mL/min (ref >60)
GFR calc non Af Amer: >60 mL/min (ref >60)
Glucose, Bld: 90 mg/dL (ref 70–99)
Potassium: 3.3 mmol/L — ABNORMAL LOW (ref 3.5–5.1)
Sodium: 140 mmol/L (ref 135–145)

## 2019-05-25 MED ORDER — ASPIRIN 81 MG PO CHEW: 81.0000 mg | CHEWABLE_TABLET | Freq: Every day | ORAL | 0 refills | Status: DC

## 2019-05-25 MED ORDER — GLUCERNA SHAKE PO LIQD: 237.0000 mL | Freq: Three times a day (TID) | ORAL | 0 refills | Status: DC

## 2019-05-25 MED ORDER — LISINOPRIL 5 MG PO TABS: 5.0000 mg | ORAL_TABLET | Freq: Every day | ORAL | 0 refills | Status: DC

## 2019-05-25 MED ORDER — ATORVASTATIN CALCIUM 80 MG PO TABS: 80.0000 mg | ORAL_TABLET | Freq: Every day | ORAL | 0 refills | Status: DC

## 2019-05-25 MED ORDER — METOPROLOL TARTRATE 25 MG PO TABS: 25.0000 mg | ORAL_TABLET | Freq: Two times a day (BID) | ORAL | 0 refills | Status: DC

## 2019-05-25 MED ORDER — METHIMAZOLE 10 MG PO TABS: 10.0000 mg | ORAL_TABLET | Freq: Two times a day (BID) | ORAL | 0 refills | Status: DC

## 2019-05-25 MED ORDER — TICAGRELOR 90 MG PO TABS: 90.0000 mg | ORAL_TABLET | Freq: Two times a day (BID) | ORAL | 0 refills | Status: DC

## 2019-05-25 MED ORDER — NITROGLYCERIN 0.4 MG SL SUBL: 0.4000 mg | SUBLINGUAL_TABLET | SUBLINGUAL | 3 refills | Status: DC | PRN

## 2019-05-25 MED ORDER — FUROSEMIDE 20 MG PO TABS: 20.0000 mg | ORAL_TABLET | Freq: Two times a day (BID) | ORAL | 11 refills | Status: DC

## 2019-05-25 NOTE — Progress Notes (Signed)
   Vital Signs MEWS/VS Documentation      05/25/2019 0519 05/25/2019 0700 05/25/2019 0800 05/25/2019 0953   MEWS Score:  0  0  0  0   MEWS Score Color:  Green  Green  Green  Green   Resp:  18  -  -  -   Pulse:  65  -  -  74   BP:  123/79  -  -  124/71   Temp:  98.2 F (36.8 C)  -  -  -   O2 Device:  Room Air  -  -  -   Level of Consciousness:  -  -  Alert  Maud Deed Tobias-Diakun 05/25/2019,2:04 PM

## 2019-05-25 NOTE — Evaluation (Signed)
Physical Therapy Evaluation Patient Details Name: Amber [name] MRN: [id number] DOB: 08/15/1971 Today's Date: 05/25/2019   History of Present Illness  Patient is a 48 y/o female who presents with chest pain and SOB. ST changes on EKG, elevated troponin. Found to have NSTEMI and Acute hypoxemic respiratory failure due to pulmonary edema; intubated 8/4-8/5. 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 PMH includes hyperthyroidism, essential hypertension, and anemia.  Clinical Impression  Patient presents with mild dyspnea on exertion s/p above. Pt independent PTA and works in [employer]. Today, pt tolerated transfers, gait training and stair training with supervision-Mod I for safety. Noted to have 2/4 DOE post stair negotiation but VSS, Sp02 >91% and HR ranged from 70-90s bpm. Education re: exercise recommendations. Pt mainly concerned about understanding medications at d/c. Pt does not require skilled therapy services as pt functioning close to baseline. Discharge from therapy.    Follow Up Recommendations No PT follow up    Equipment Recommendations  None recommended by PT    Recommendations for Other Services       Precautions / Restrictions Precautions Precautions: None Restrictions Weight Bearing Restrictions: No      Mobility  Bed Mobility Overal bed mobility: Modified Independent             General bed mobility comments: No assist needed.  Transfers Overall transfer level: Modified independent Equipment used: None             General transfer comment: Stood from EOB x1, from toilet x1.  Ambulation/Gait Ambulation/Gait assistance: Modified independent (Device/Increase time) Gait Distance (Feet): 300 Feet Assistive device: None Gait Pattern/deviations: Step-through pattern Gait velocity: slow   General Gait Details: Very slow but steady gait (reports slow is baseline). Sp02 remained >91% on RA. HR ranged from 70-90s  bpm.  Stairs Stairs: Yes Stairs assistance: Modified independent (Device/Increase time) Stair Management: One rail Right Number of Stairs: 13 General stair comments: Cues for safety. 2/4 DOE.  Wheelchair Mobility    Modified Rankin (Stroke Patients Only)       Balance Overall balance assessment: Needs assistance Sitting-balance support: Feet supported;No upper extremity supported Sitting balance-Leahy Scale: Normal     Standing balance support: During functional activity Standing balance-Leahy Scale: Fair Standing balance comment: Able to stand at sink and wash hands without difficulty; performed SLS to adjust socks with UE support, no issues.                             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     Hand Dominance   Dominant Hand: Right    Extremity/Trunk Assessment   Upper Extremity Assessment Upper Extremity Assessment: Defer to OT evaluation    Lower Extremity Assessment Lower Extremity Assessment: Overall WFL for tasks assessed    Cervical / Trunk Assessment Cervical / Trunk Assessment: Normal  Communication   Communication: No difficulties  Cognition Arousal/Alertness: Awake/alert Behavior During Therapy: WFL for tasks assessed/performed Overall Cognitive Status: Within Functional Limits  for tasks assessed                                        General Comments General comments (skin integrity, edema, etc.): Daughter, who is a Control and instrumentation engineer (in the [place]) was present during  session.    Exercises     Assessment/Plan    PT Assessment Patent does not need any further PT services  PT Problem List         PT Treatment Interventions      PT Goals (Current goals can be found in the Care Plan section)  Acute Rehab PT Goals Patient Stated Goal: to go home PT Goal Formulation: All assessment and education complete, DC therapy    Frequency     Barriers to discharge        Co-evaluation               AM-PAC PT "6 Clicks" Mobility  Outcome Measure Help needed turning from your back to your side while in a flat bed without using bedrails?: None Help needed moving from lying on your back to sitting on the side of a flat bed without using bedrails?: None Help needed moving to and from a bed to a chair (including a wheelchair)?: None Help needed standing up from a chair using your arms (e.g., wheelchair or bedside chair)?: None Help needed to walk in hospital room?: None Help needed climbing 3-5 steps with a railing? : A Little 6 Click Score: 23    End of Session Equipment Utilized During Treatment: Gait belt Activity Tolerance: Patient tolerated treatment well Patient left: in bed;with call bell/phone within reach;with family/visitor present Nurse Communication: Mobility status PT Visit Diagnosis: Unsteadiness on feet (R26.81)    Time: 4174-0814 PT Time Calculation (min) (ACUTE ONLY): 20 min   Charges:   PT Evaluation $PT Eval Moderate Complexity: 1 Mod          Mylo Red, PT, DPT Acute Rehabilitation Services Pager [phone] Office [phone]      Blake Divine A Lanier Ensign 05/25/2019, 2:36 PM

## 2019-05-25 NOTE — Progress Notes (Signed)
     Subjective:   Sitting in chair looks better   Objective:  Vitals:   05/24/19 1742 05/24/19 1925 05/25/19 0011 05/25/19 0519  BP: 140/80 113/67 115/65 123/79  Pulse: 75 85 72 65  Resp: 18 18 18 18   Temp: 100.2 F (37.9 C) 99 F (37.2 C) 98.8 F (37.1 C) 98.2 F (36.8 C)  TempSrc: Oral Oral Oral Oral  SpO2: 94% 97% 97% 96%  Weight:    80.7 kg  Height:        Intake/Output from previous day:  Intake/Output Summary (Last 24 hours) at 05/25/2019 0850 Last data filed at 05/25/2019 0753 Gross per 24 hour  Intake 480 ml  Output 1000 ml  Net -520 ml    Physical Exam:  Clear lungs  No murmur Abdomen soft No edema Right radial cath site A   Lab Results: Basic Metabolic Panel: Recent Labs    05/23/19 0328 05/24/19 0431 05/25/19 0205  NA 140 141 140  K 3.5 3.2* 3.3*  CL 105 106 103  CO2 24 24 24   GLUCOSE 135* 92 90  BUN 18 24* 17  CREATININE 0.50 0.55 0.42*  CALCIUM 8.9 9.2 8.9  MG 2.1 2.0  --   PHOS 3.0 4.9*  --    Liver Function Tests: No results for input(s): AST, ALT, ALKPHOS, BILITOT, PROT, ALBUMIN in the last 72 hours. No results for input(s): LIPASE, AMYLASE in the last 72 hours. CBC: Recent Labs    05/24/19 0431 05/25/19 0205  WBC 17.4* 13.6*  NEUTROABS  --  6.7  HGB 10.2* 9.9*  HCT 30.1* 28.8*  MCV 72.2* 71.8*  PLT 334 355    Imaging: No results found.  Cardiac Studies:  ECG: ST non specific ST changes    Telemetry: ST rates in 1120 range   Echo: EF 25-30%   Medications:   . aspirin  81 mg Oral Daily  . atorvastatin  80 mg Oral q1800  . Chlorhexidine Gluconate Cloth  6 each Topical Daily  . enoxaparin (LOVENOX) injection  40 mg Subcutaneous Q24H  . feeding supplement (GLUCERNA SHAKE)  237 mL Oral TID BM  . furosemide  40 mg Intravenous Daily  . lisinopril  5 mg Oral Daily  . mouth rinse  15 mL Mouth Rinse BID  . methimazole  10 mg Oral BID  . metoprolol tartrate  25 mg Oral BID  . sodium chloride flush  3 mL Intravenous  Q12H  . sodium chloride flush  3 mL Intravenous Q12H  . ticagrelor  90 mg Oral BID     . sodium chloride    . nitroGLYCERIN Stopped (05/21/19 1016)    Assessment/Plan:   SEMI:  Post stenting of mid LAD and occluded OM EF 25-30% continue DAT and beta blocker  CHF:  BNP was only 137 and PCWP at cath 12  Continue beta blocker ace and diuretic   Thyroid:  TSH suppressed T4 elevated PTU, beta blocker hydrocortisone started Not compliant with her Graves meds at home Radioactive Iodine uptake study will not be accurate post cath   Will arrange outpatient f/u for patient in our [place] office since she lives in [place] Will sign off   Jenkins Rouge 05/25/2019, 8:50 AM

## 2019-05-25 NOTE — Progress Notes (Signed)
bril[employer] discount card provided to patient prior to DC.

## 2019-05-25 NOTE — Progress Notes (Signed)
CARDIAC REHAB PHASE I   PRE:  Rate/Rhythm: 100 ST    BP: sitting 130/79    SaO2: 97 RA  MODE:  Ambulation: 470 ft   POST:  Rate/Rhythm: 76 SR    BP: sitting 130/82     SaO2: 98 RA   Able to ambulate independently. C/o sinus drainage today. Sts she is still anxious about having acute SOB again. Had her practice IS and only able to do 500-700 mL. Highly encouraged. She is worried about her meds. Encouraged her to have RN explain them in depth at d/c.  [id number]  [place], ACSM 05/25/2019 11:18 AM

## 2019-05-25 NOTE — Discharge Summary (Signed)
Physician Discharge Summary  Floria Ravelingntonia Rosiak JXB:[id number] DOB: 02/19/1971 DOA: 05/20/2019  PCP: Doreene Nestlark, Katherine K, NP  Admit date: 05/20/2019 Discharge date: 05/25/2019  Recommendations for Outpatient Follow-up:  1. Cardiac Rehab at Discharge (Virtual Possible) 2. Follow up with Cardiology as directed 3. Follow up with PCP in 7-10 days. 4. BMP on 05/27/2019. 5. Weigh yourself daily and call cardiology if you gain 3lbs or more in one day or 5 lbs or more in one week.   Discharge Diagnoses: Principal diagnosis is #1 1. NSTAMI 2. Acute hypoxemic respiratory failure 3. Pulmonary edema 4. Hyperthyroidism 5. Hypertension 6. Hypokalemia  Discharge Condition: Fair Disposition: Home with Cardiac Rehab  Diet recommendation: Heart healthy  Filed Weights   05/23/19 1709 05/24/19 0519 05/25/19 0519  Weight: 82.1 kg 81.9 kg 80.7 kg    History of present illness:  48 year old female with PMH as below, which is significant for hyperthyroid on methimazole. She is a Designer, [employer] employee. [facility] on [facility]3 with complaints of chest pain x 3 days. Chest pain is accompanied by L arm radiation, palpitations, and SOB. Upon arrival to ED she was tachycardiac with initial troponin of 57 with ST changes on EKG. Troponin increased to 162. Cardiology was consulted who felt this was less likely to be ACS. Her dyspnea began to worsen and she was taken for CXR, CT chest. CXR demonstrates central congestion and fluid in fissure. CT chest demonstrated bilateral GGO. O2 escalated to 4L. PCCM asked to evaluate.   Hospital Course:  48 year old female presented with CP x 3 days. Typical chest pain by description. Troponin continued to increase in ED. Developed respiratory distress. PCCM asked to see. Required intubation early AM 8/4. Troponin climb. 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 Extubated later that afternoon.  The patient has been evaluated by cardiac rehab and will be following  with them after discharge. Cardiology will follow the patient as outpatient.    Today's assessment: S: The patient is resting comfortably. No new complaints. O: Vitals:  Vitals:   05/25/19 0519 05/25/19 0953  BP: 123/79 124/71  Pulse: 65 74  Resp: 18   Temp: 98.2 F (36.8 C)   SpO2: 96%     Constitutional:   The patient is awake, alert, and oriented x 3. No acute distress. Respiratory:   No increased work of breathing.  No wheezes, rales, or rhonchi.  No tactile fremitus. Cardiovascular:   Regular rate and rhythm  No murmurs, ectopy, or gallups.  No lateral PMI. No thrills. Abdomen:   Abdomen is soft, non-tender, non-distended.  No hernias, masses, or organomegaly  Normoactive bowel sounds. Musculoskeletal:   No cyanosis, clubbing, or edema Skin:   No rashes, lesions, ulcers  palpation of skin: no induration or nodules Neurologic:   CN 2-12 intact  Sensation all 4 extremities intact Psychiatric:   Mental status ? Mood, affect appropriate ? Orientation to person, place, time   judgment and insight appear intact    Discharge Instructions  Discharge Instructions    (HEART FAILURE PATIENTS) Call MD:  Anytime you have any of the following symptoms: 1) 3 pound weight gain in 24 hours or 5 pounds in 1 week 2) shortness of breath, with or without a dry hacking cough 3) swelling in the hands, feet or stomach 4) if you have to sleep on extra pillows at night in order to breathe.   Complete by: As directed    Amb Referral  to Cardiac Rehabilitation   Complete by: As directed    Diagnosis:  Coronary Stents PTCA NSTEMI     After initial evaluation and assessments completed: Virtual Based Care may be provided alone or in conjunction with Phase 2 Cardiac Rehab based on patient barriers.: Yes   Call MD for:  difficulty breathing, headache or visual disturbances   Complete by: As directed    Call MD for:  persistant dizziness or light-headedness   Complete  by: As directed    Call MD for:  severe uncontrolled pain   Complete by: As directed    Diet - low sodium heart healthy   Complete by: As directed    Discharge instructions   Complete by: As directed    Follow up with cardiology as directed. Follow up with cardiac rehab as directed. Follow up with PCP in 7-10 days. Check BMP in one week and report to PCP.   Heart Failure patients record your daily weight using the same scale at the same time of day   Complete by: As directed    Increase activity slowly   Complete by: As directed    STOP any activity that causes chest pain, shortness of breath, dizziness, sweating, or exessive weakness   Complete by: As directed      Allergies as of 05/25/2019   No Known Allergies     Medication List    STOP taking these medications   amLODipine 10 MG tablet Commonly known as: NORVASC   atenolol 50 MG tablet Commonly known as: TENORMIN   ibuprofen 200 MG tablet Commonly known as: ADVIL   naproxen 500 MG tablet Commonly known as: Naprosyn   valsartan-hydrochlorothiazide 320-25 MG tablet Commonly known as: DIOVAN-HCT     TAKE these medications   aspirin 81 MG chewable tablet Chew 1 tablet (81 mg total) by mouth daily. Start taking on: May 26, 2019   atorvastatin 80 MG tablet Commonly known as: LIPITOR Take 1 tablet (80 mg total) by mouth daily at 6 PM.   feeding supplement (GLUCERNA SHAKE) Liqd Take 237 mLs by mouth 3 (three) times daily between meals.   ferrous sulfate 325 (65 FE) MG tablet Take 325 mg by mouth every morning.   furosemide 20 MG tablet Commonly known as: Lasix Take 1 tablet (20 mg total) by mouth 2 (two) times daily.   lisinopril 5 MG tablet Commonly known as: ZESTRIL Take 1 tablet (5 mg total) by mouth daily. Start taking on: May 26, 2019   methimazole 10 MG tablet Commonly known as: TAPAZOLE Take 1 tablet (10 mg total) by mouth 2 (two) times daily. What changed:   how much to take  how to  take this  when to take this  additional instructions   metoprolol tartrate 25 MG tablet Commonly known as: LOPRESSOR Take 1 tablet (25 mg total) by mouth 2 (two) times daily.   nitroGLYCERIN 0.4 MG SL tablet Commonly known as: Nitrostat Place 1 tablet (0.4 mg total) under the tongue every 5 (five) minutes as needed for chest pain.   ticagrelor 90 MG Tabs tablet Commonly known as: BRILINTA Take 1 tablet (90 mg total) by mouth 2 (two) times daily.      No Known Allergies  The results of significant diagnostics from this hospitalization (including imaging, microbiology, ancillary and laboratory) are listed below for reference.    Significant Diagnostic Studies: Dg Chest 2 View  Result Date: 05/20/2019 CLINICAL DATA:  Chest pain and shortness of breath for 3  days EXAM: CHEST - 2 VIEW COMPARISON:  Radiograph 01/01/2015, 01/17/2013 FINDINGS: Patchy areas of consolidation with few air bronchograms are present in right lower lobe and retrocardiac space. No pneumothorax or effusion. Cardiomediastinal contours are normal. No acute osseous or soft tissue abnormality. IMPRESSION: Findings could reflect multifocal pneumonia in the appropriate setting. Electronically Signed   By: Kreg Shropshire M.D.   On: 05/20/2019 17:37   Dg Abd 1 View  Result Date: 05/21/2019 CLINICAL DATA:  NG tube placement EXAM: ABDOMEN - 1 VIEW COMPARISON:  None. FINDINGS: The tip of the NG tube is seen projecting over the distal stomach. The side hole is seen projecting over the mid stomach. Air seen in nondilated loops of bowel. IMPRESSION: Tip of NG tube seen projecting over the stomach. Electronically Signed   By: Jonna Clark M.D.   On: 05/21/2019 10:19   Ct Chest W Contrast  Result Date: 05/20/2019 CLINICAL DATA:  Abnormal chest x-ray EXAM: CT CHEST WITH CONTRAST TECHNIQUE: Multidetector CT imaging of the chest was performed during intravenous contrast administration. CONTRAST:  75mL OMNIPAQUE IOHEXOL 300 MG/ML  SOLN  COMPARISON:  Chest x-ray from earlier in the same day. FINDINGS: Cardiovascular: Thoracic aorta and its branches are within normal limits. No aneurysmal dilatation or dissection is seen. Moderate coronary calcifications are noted. No significant cardiac enlargement is noted. No large central pulmonary embolus is seen. Mediastinum/Nodes: Thoracic inlet is within normal limits. The esophagus as visualized is within normal limits. No hilar or mediastinal adenopathy is seen. Lungs/Pleura: The lungs are well aerated bilaterally. Patchy ground-glass infiltrates are identified bilaterally predominately within the bases. No associated effusion or pneumothorax is seen. Scattered calcified granulomas are noted. Upper Abdomen: The visualized upper abdomen is within normal limits. Musculoskeletal: No acute bony abnormality is seen. IMPRESSION: There are a spectrum of findings in the lungs which can be seen with acute atypical infection (as well as other non-infectious etiologies). In particular, viral pneumonia (including COVID-19) should be considered in the appropriate clinical setting. Electronically Signed   By: Alcide Clever M.D.   On: 05/20/2019 20:21   Dg Chest Port 1 View  Result Date: 05/23/2019 CLINICAL DATA:  Respiratory failure EXAM: PORTABLE CHEST 1 VIEW COMPARISON:  05/22/2019 FINDINGS: Endotracheal tube and NG tube removed. Vascular congestion and bibasilar airspace disease mildly improved in the bases. No significant pleural fluid. IMPRESSION: Endotracheal tube and NG tube removed. Mild improvement in bibasilar airspace disease. Electronically Signed   By: Marlan Palau M.D.   On: 05/23/2019 08:19   Dg Chest Port 1 View  Result Date: 05/22/2019 CLINICAL DATA:  Intubation.  Respiratory failure. EXAM: PORTABLE CHEST 1 VIEW COMPARISON:  05/21/2019. FINDINGS: Endotracheal tube has been withdrawn and its tip is now 4 cm above the carina in good anatomic position. NG tube noted with tip below left hemidiaphragm.  Cardiomegaly. Diffuse bilateral pulmonary infiltrates/edema again noted, improved from prior exam. Small left pleural effusion cannot be excluded. No pneumothorax. IMPRESSION: 1. Endotracheal tube has been withdrawn and its tip is now 4 cm above the carina in good anatomic position. NG tube noted with tip below left hemidiaphragm. 2. Cardiomegaly. Diffuse bilateral pulmonary infiltrates/edema again noted, improved from prior exam. Small left pleural effusion cannot be excluded. Electronically Signed   By: Maisie Fus  Register   On: 05/22/2019 08:06   Portable Chest X-ray  Result Date: 05/21/2019 CLINICAL DATA:  Check endotracheal tube placement EXAM: PORTABLE CHEST 1 VIEW COMPARISON:  Film from previous day FINDINGS: Endotracheal tube is noted with the  tip in the right mainstem bronchus. This should be withdrawn at least 4 cm. Increasing bilateral infiltrates are noted similar to that seen on recent CT examination left worse than right. Portion of this may be related to the inadvertent intubation of the right mainstem bronchus. Bony structures are within normal limits. IMPRESSION: Right mainstem bronchus intubation. This should be withdrawn approximately 4 cm. Diffuse bilateral infiltrates consistent with the patient's given clinical history. These results will be called to the ordering clinician or representative by the Radiologist Assistant, and communication documented in the PACS or zVision Dashboard. Electronically Signed   By: Alcide Clever M.D.   On: 05/21/2019 03:50   Vas [place] Lower Extremity Venous (dvt) (only [facility])  Result Date: 05/21/2019  Lower Venous Study Other Indications: Patient states she had pain in leg several days ago, now                    presenting with tachycardia and chest pain radiating to the                    left shoulder, SOB, and nausea. Comparison Study: No prior study on file for comparison. Performing Technologist: Sherren Kerns RVS  Examination Guidelines: A complete  evaluation includes B-mode imaging, spectral Doppler, color Doppler, and power Doppler as needed of all accessible portions of each vessel. Bilateral testing is considered an integral part of a complete examination. Limited examinations for reoccurring indications may be performed as noted.  +-----+---------------+---------+-----------+----------+-------+  RIGHT Compressibility Phasicity Spontaneity Properties Summary  +-----+---------------+---------+-----------+----------+-------+  CFV   Full            Yes       Yes                             +-----+---------------+---------+-----------+----------+-------+   +---------+---------------+---------+-----------+----------+-------+  LEFT      Compressibility Phasicity Spontaneity Properties Summary  +---------+---------------+---------+-----------+----------+-------+  CFV       Full            Yes       Yes                             +---------+---------------+---------+-----------+----------+-------+  SFJ       Full                                                      +---------+---------------+---------+-----------+----------+-------+  FV Prox   Full                                                      +---------+---------------+---------+-----------+----------+-------+  FV Mid    Full                                                      +---------+---------------+---------+-----------+----------+-------+  FV Distal Full                                                      +---------+---------------+---------+-----------+----------+-------+  PFV       Full                                                      +---------+---------------+---------+-----------+----------+-------+  POP       Full            Yes       Yes                             +---------+---------------+---------+-----------+----------+-------+  PTV       Full                                                      +---------+---------------+---------+-----------+----------+-------+  PERO      Full                                                       +---------+---------------+---------+-----------+----------+-------+     Summary: Right: No evidence of common femoral vein obstruction. Left: There is no evidence of deep vein thrombosis in the lower extremity.  *See table(s) above for measurements and observations. Electronically signed by Waverly Ferrarihristopher Dickson MD on 05/21/2019 at 1:33:46 PM.    Final     Microbiology: Recent Results (from the past 240 hour(s))  SARS Coronavirus 2 [facility](Hospital order, Performed in [facility] hospital lab) Nasopharyngeal Nasopharyngeal Swab     Status: None   Collection Time: 05/20/19  7:21 PM   Specimen: Nasopharyngeal Swab  Result Value Ref Range Status   SARS Coronavirus 2 NEGATIVE NEGATIVE Final    Comment: (NOTE) If result is NEGATIVE SARS-CoV-2 target nucleic acids are NOT DETECTED. The SARS-CoV-2 RNA is generally detectable in upper and lower  respiratory specimens during the acute phase of infection. The lowest  concentration of SARS-CoV-2 viral copies this assay can detect is 250  copies / mL. A negative result does not preclude SARS-CoV-2 infection  and should not be used as the sole basis for treatment or other  patient management decisions.  A negative result may occur with  improper specimen collection / handling, submission of specimen other  than nasopharyngeal swab, presence of viral mutation(s) within the  areas targeted by this assay, and inadequate number of viral copies  (<250 copies / mL). A negative result must be combined with clinical  observations, patient history, and epidemiological information. If result is POSITIVE SARS-CoV-2 target nucleic acids are DETECTED. The SARS-CoV-2 RNA is generally detectable in upper and lower  respiratory specimens dur ing the acute phase of infection.  Positive  results are indicative of active infection with SARS-CoV-2.  Clinical  correlation with patient history and other diagnostic information is    necessary to determine patient infection status.  Positive results do  not rule out bacterial infection or co-infection with other viruses. If result is PRESUMPTIVE POSTIVE SARS-CoV-2 nucleic acids MAY BE PRESENT.   A presumptive positive result was obtained on the submitted specimen  and confirmed on repeat testing.  While  2019 novel coronavirus  (SARS-CoV-2) nucleic acids may be present in the submitted sample  additional confirmatory testing may be necessary for epidemiological  and / or clinical management purposes  to differentiate between  SARS-CoV-2 and other Sarbecovirus currently known to infect humans.  If clinically indicated additional testing with an alternate test  methodology [phone] is advised. The SARS-CoV-2 RNA is generally  detectable in upper and lower respiratory sp ecimens during the acute  phase of infection. The expected result is Negative. Fact Sheet for Patients:  BoilerBrush.com.cyhttps://www.fda.gov/media/136312/download Fact Sheet for Healthcare Providers: https://pope.com/https://www.fda.gov/media/136313/download This test is not yet approved or cleared by the [place] FDA and has been authorized for detection and/or diagnosis of SARS-CoV-2 by FDA under an Emergency Use Authorization (EUA).  This EUA will remain in effect (meaning this test can be used) for the duration of the COVID-19 declaration under Section 564(b)(1) of the Act, 21 U.S.C. section 360bbb-3(b)(1), unless the authorization is terminated or revoked sooner. Performed at [facility], [address] [place], [place] [address]   MRSA PCR Screening     Status: None   Collection Time: 05/21/19  2:22 AM  Result Value Ref Range Status   MRSA by PCR NEGATIVE NEGATIVE Final    Comment:        The GeneXpert MRSA Assay (FDA approved for NASAL specimens only), is one component of a comprehensive MRSA colonization surveillance program. It is not intended to diagnose MRSA infection nor to guide or monitor  treatment for MRSA infections. Performed at [facility], [address] [place], [place] [address]   Culture, blood (routine x 2) Call MD if unable to obtain prior to antibiotics being given     Status: None (Preliminary result)   Collection Time: 05/21/19  2:24 AM   Specimen: BLOOD LEFT HAND  Result Value Ref Range Status   Specimen Description BLOOD LEFT HAND  Final   Special Requests   Final    BOTTLES DRAWN AEROBIC ONLY Blood Culture adequate volume   Culture   Final    NO GROWTH 4 DAYS Performed at [facility], [address] [place], [place] [address]    Report Status PENDING  Incomplete  Culture, blood (routine x 2) Call MD if unable to obtain prior to antibiotics being given     Status: None (Preliminary result)   Collection Time: 05/21/19  2:36 AM   Specimen: BLOOD LEFT HAND  Result Value Ref Range Status   Specimen Description BLOOD LEFT HAND  Final   Special Requests   Final    BOTTLES DRAWN AEROBIC ONLY Blood Culture adequate volume   Culture   Final    NO GROWTH 4 DAYS Performed at [facility], [address] [place], [place] [address]    Report Status PENDING  Incomplete  Culture, respiratory (non-expectorated)     Status: None   Collection Time: 05/21/19  5:55 AM   Specimen: Tracheal Aspirate; Respiratory  Result Value Ref Range Status   Specimen Description TRACHEAL ASPIRATE  Final   Special Requests NONE  Final   Gram Stain   Final    FEW WBC PRESENT, PREDOMINANTLY PMN RARE GRAM NEGATIVE RODS    Culture   Final    RARE Consistent with normal respiratory flora. Performed at [facility], [address] [place], [place] [address]    Report Status 05/23/2019 FINAL  Final     Labs: Basic Metabolic Panel: Recent Labs  Lab 05/20/19 2115  05/21/19 1025 05/22/19 6226 05/22/19 3335 05/23/19 0328 05/24/19 0431 05/25/19 0205  NA  --    < > 140 140 133*   141 140 141 140  K  --    < > 4.1 3.6 3.2*   3.3* 3.5 3.2* 3.3*  CL   --   --  106 105  --  105 106 103  CO2  --   --  18* 23  --  24 24 24   GLUCOSE  --   --  139* 143*  --  135* 92 90  BUN  --   --  15 16  --  18 24* 17  CREATININE  --   --  0.62 0.50  --  0.50 0.55 0.42*  CALCIUM  --   --  8.9 9.3  --  8.9 9.2 8.9  MG 1.7  --   --  2.1  --  2.1 2.0  --   PHOS 4.0  --   --  3.9  --  3.0 4.9*  --    < > = values in this interval not displayed.   Liver Function Tests: Recent Labs  Lab 05/20/19 2115  AST 35  ALT 15  ALKPHOS 114  BILITOT 0.4  PROT 7.1  ALBUMIN 3.3*   No results for input(s): LIPASE, AMYLASE in the last 168 hours. No results for input(s): AMMONIA in the last 168 hours. CBC: Recent Labs  Lab 05/21/19 0224  05/21/19 1058 05/22/19 0633 05/22/19 0938 05/23/19 0328 05/24/19 0431 05/25/19 0205  WBC 18.5*  --  33.4* 25.8*  --  16.7* 17.4* 13.6*  NEUTROABS 14.7*  --   --  17.9*  --   --   --  6.7  HGB 8.4*   < > 12.4 11.2* 10.5*   10.2* 9.4* 10.2* 9.9*  HCT 24.2*   < > 35.6* 32.9* 31.0*   30.0* 27.4* 30.1* 28.8*  MCV 72.9*  --  70.8* 72.3*  --  71.5* 72.2* 71.8*  PLT 247  --  365 309  --  298 334 355   < > = values in this interval not displayed.   Cardiac Enzymes: Recent Labs  Lab 05/20/19 2115  CKTOTAL 324*   BNP: BNP (last 3 results) Recent Labs    05/20/19 1726  BNP 137.5*    ProBNP (last 3 results) No results for input(s): PROBNP in the last 8760 hours.  CBG: No results for input(s): GLUCAP in the last 168 hours.  Active Problems:   Essential hypertension   Hyperlipidemia   Prediabetes   Hyperthyroidism   Elevated troponin   Hypokalemia   Acute respiratory failure with hypoxia (HCC)   Respiratory failure with hypoxia (HCC)   NSTEMI (non-ST elevated myocardial infarction) (HCC)   Hypoxemia   Cardiac shock syndrome ([place])   Community acquired pneumonia   Acute encephalopathy   Time coordinating discharge: 38 minutes.  Signed:        Jaidev Sanger, DO [facility]  05/25/2019, 2:17 PM

## 2019-05-25 NOTE — Progress Notes (Signed)
Patient discharged.  Peripheral IV's removed, CCMD called, telemetry box placed back at nurse's station. Pt education provided by Charge Nurse Caryl Pina, RN wrt to AVS (medications and appointments).  Brelinta discount card provided to patient to take to pharmacy at discharge.

## 2019-05-26 LAB — CULTURE, BLOOD (ROUTINE X 2)
Culture: NO GROWTH
Culture: NO GROWTH
Special Requests: ADEQUATE
Special Requests: ADEQUATE

## 2019-05-27 ENCOUNTER — Emergency Department (HOSPITAL_COMMUNITY)
Admission: EM | Admit: 2019-05-27 | Discharge: 2019-05-27 | Disposition: A | Discharge status: Home / Self Care | Payer: Commercial Managed Care - PPO | Attending: Emergency Medicine | Admitting: Emergency Medicine

## 2019-05-27 ENCOUNTER — Other Ambulatory Visit: Payer: Self-pay

## 2019-05-27 ENCOUNTER — Ambulatory Visit: Payer: Self-pay

## 2019-05-27 DIAGNOSIS — I252 Old myocardial infarction: Secondary | ICD-10-CM | POA: Diagnosis not present

## 2019-05-27 DIAGNOSIS — E0590 Thyrotoxicosis, unspecified without thyrotoxic crisis or storm: Secondary | ICD-10-CM | POA: Diagnosis not present

## 2019-05-27 DIAGNOSIS — Z7982 Long term (current) use of aspirin: Secondary | ICD-10-CM | POA: Diagnosis not present

## 2019-05-27 DIAGNOSIS — J9691 Respiratory failure, unspecified with hypoxia: Secondary | ICD-10-CM | POA: Insufficient documentation

## 2019-05-27 DIAGNOSIS — I10 Essential (primary) hypertension: Secondary | ICD-10-CM | POA: Diagnosis not present

## 2019-05-27 DIAGNOSIS — Z79899 Other long term (current) drug therapy: Secondary | ICD-10-CM | POA: Diagnosis not present

## 2019-05-27 DIAGNOSIS — R197 Diarrhea, unspecified: Secondary | ICD-10-CM | POA: Diagnosis not present

## 2019-05-27 LAB — CBC WITH DIFFERENTIAL/PLATELET
Abs Immature Granulocytes: 0.09 10*3/uL — ABNORMAL HIGH (ref 0.00–0.07)
Basophils Absolute: 0.1 10*3/uL (ref 0.0–0.1)
Basophils Relative: 0 %
Eosinophils Absolute: 0.3 10*3/uL (ref 0.0–0.5)
Eosinophils Relative: 1 %
HCT: 34.5 % — ABNORMAL LOW (ref 36.0–46.0)
Hemoglobin: 11.7 g/dL — ABNORMAL LOW (ref 12.0–15.0)
Immature Granulocytes: 1 %
Lymphocytes Relative: 30 %
Lymphs Abs: 5.3 10*3/uL — ABNORMAL HIGH (ref 0.7–4.0)
MCH: 24.5 pg — ABNORMAL LOW (ref 26.0–34.0)
MCHC: 33.9 g/dL (ref 30.0–36.0)
MCV: 72.2 fL — ABNORMAL LOW (ref 80.0–100.0)
Monocytes Absolute: 1 10*3/uL (ref 0.1–1.0)
Monocytes Relative: 6 %
Neutro Abs: 10.8 10*3/uL — ABNORMAL HIGH (ref 1.7–7.7)
Neutrophils Relative %: 62 %
Platelets: 457 10*3/uL — ABNORMAL HIGH (ref 150–400)
RBC: 4.78 MIL/uL (ref 3.87–5.11)
RDW: 16.1 % — ABNORMAL HIGH (ref 11.5–15.5)
WBC: 17.4 10*3/uL — ABNORMAL HIGH (ref 4.0–10.5)
nRBC: 0 % (ref 0.0–0.2)

## 2019-05-27 LAB — BASIC METABOLIC PANEL
Anion gap: 8 (ref 5–15)
BUN: 12 mg/dL (ref 6–20)
CO2: 23 mmol/L (ref 22–32)
Calcium: 9.2 mg/dL (ref 8.9–10.3)
Chloride: 108 mmol/L (ref 98–111)
Creatinine, Ser: 0.41 mg/dL — ABNORMAL LOW (ref 0.44–1.00)
GFR calc Af Amer: >60 mL/min (ref >60)
GFR calc non Af Amer: >60 mL/min (ref >60)
Glucose, Bld: 105 mg/dL — ABNORMAL HIGH (ref 70–99)
Potassium: 3.7 mmol/L (ref 3.5–5.1)
Sodium: 139 mmol/L (ref 135–145)

## 2019-05-27 LAB — C DIFFICILE QUICK SCREEN W PCR REFLEX
C Diff antigen: NEGATIVE
C Diff interpretation: NOT DETECTED
C Diff toxin: NEGATIVE

## 2019-05-27 NOTE — Telephone Encounter (Signed)
Returned call to pt.  Reported onset of diarrhea last eve, approx. 5:00 PM.  Stated she has had multiple watery stools since onset, and had to lay on BR floor, during night, to be close to toilet.  Stated she had too many stools to count.  Denied nausea, vomiting, abdominal cramps, or abdominal bloating. Denied fever or chills.  Last diarrhea stool reported was at 8:50 AM.  Denied dizziness, weakness, or decreased urine output.  Stated her mouth is somewhat dry.  Has not taken any OTC antidiarrheal medication.  Denies being on antibiotic at this time, but thought she received antibiotics recently in the hospital.   Advised pt. Of her possible exposure to a GI virus, in the Radiology Dept., when she was there on 8/6.  Care advice given per protocol.  Pt. Verb. Understanding.  Advised will reach out to the nurse at the office, and to expect a call back re: poss. Appt. Or further recommendations.  Agreed with plan.   Called office; spoke with nurse at [facility]. Advised of pt's recent hospital stay for MI, poss. Exposure to GI Virus in Radiology Dept., and of pt's c/o severe diarrhea overnight. She will contact the pt. For further recommendation.        Reason for Disposition . [1] SEVERE diarrhea (e.g., 7 or more times / day more than normal) AND [2] present > 24 hours (1 day)  Answer Assessment - Initial Assessment Questions 1. DIARRHEA SEVERITY: "How bad is the diarrhea?" "How many extra stools have you had in the past 24 hours than normal?"    - NO DIARRHEA (SCALE 0)   - MILD (SCALE 1-3): Few loose or mushy BMs; increase of 1-3 stools over normal daily number of stools; mild increase in ostomy output.   -  MODERATE (SCALE 4-7): Increase of 4-6 stools daily over normal; moderate increase in ostomy output. * SEVERE (SCALE 8-10; OR 'WORST POSSIBLE'): Increase of 7 or more stools daily over normal; moderate increase in ostomy output; incontinence.     Severe; multiple stools during night 2.  ONSET: "When did the diarrhea begin?"      5:00 PM on 8/9 3. BM CONSISTENCY: "How loose or watery is the diarrhea?"      Watery brown 4. VOMITING: "Are you also vomiting?" If so, ask: "How many times in the past 24 hours?"      Denied  5. ABDOMINAL PAIN: "Are you having any abdominal pain?" If yes: "What does it feel like?" (e.g., crampy, dull, intermittent, constant)      Denied  6. ABDOMINAL PAIN SEVERITY: If present, ask: "How bad is the pain?"  (e.g., Scale 1-10; mild, moderate, or severe)   - MILD (1-3): doesn't interfere with normal activities, abdomen soft and not tender to touch    - MODERATE (4-7): interferes with normal activities or awakens from sleep, tender to touch    - SEVERE (8-10): excruciating pain, doubled over, unable to do any normal activities      Denied 7. ORAL INTAKE: If vomiting, "Have you been able to drink liquids?" "How much fluids have you had in the past 24 hours?"    Drinking water  8. HYDRATION: "Any signs of dehydration?" (e.g., dry mouth [not just dry lips], too weak to stand, dizziness, new weight loss) "When did you last urinate?"     Denied any dizziness, weakness, or decreased urine output 9. EXPOSURE: "Have you traveled to a foreign country recently?" "Have you been exposed to anyone  with diarrhea?" "Could you have eaten any food that was spoiled?"    Recent possible exposure in Radiology Dept. of  GI illness   10. ANTIBIOTIC USE: "Are you taking antibiotics now or have you taken antibiotics in the past 2 months?"      Was on antibiotics in the hospital  11. OTHER SYMPTOMS: "Do you have any other symptoms?" (e.g., fever, blood in stool)       Denied any blood in stool  12. PREGNANCY: "Is there any chance you are pregnant?" "When was your last menstrual period?"      Has not had menstrual cycle for a long time.  Protocols used: [facility]  Message from Loma Boston sent at 05/27/2019 8:40 AM EDT  Summary: FU with pt about a call about a  possible exposure   Pt says that she was released from hospital last week and someone (PEC) # called her on Sunday and said she was exposed to a virus and she could not understand them. They told her to call back at this [facility] # No CRM in chart. She says now she was having Diarrhea all nite and wants to know what call was about . Pls fu with pt at [phone]

## 2019-05-27 NOTE — Telephone Encounter (Signed)
This encounter was created in error - please disregard.

## 2019-05-27 NOTE — ED Triage Notes (Addendum)
Pt states she has had loose stools since being discharged from hospital over weekend. Pt is alert and oriented x4.

## 2019-05-27 NOTE — ED Provider Notes (Signed)
[facility] EMERGENCY DEPARTMENT Provider Note   CSN: [id number] Arrival date & time: 05/27/19  1108    History   Chief Complaint Chief Complaint  Patient presents with  . Diarrhea    HPI Amber [name] is a 48 y.o. female.     48 year old female presents emergency room with complaint of watery brown diarrhea.  Patient states symptoms started last night have been numerous to the point of unable to count how many episodes she has had.  Patient denies blood in her stools, denies abdominal pain, nausea, vomiting, fevers, chills.  Patient was recently admitted to the hospital August 2 through the 8 for an NSTEMI, was advised that she was exposed to a GI virus while in the radiology department.  Patient called her PCP today for follow-up however they felt she should come to the emergency room for lab work.  Patient was recently given antibiotics for possible pneumonia while in the hospital.  No other known sick contacts.  Patient is feeling somewhat improved at this time, states in the past almost 2 hours while waiting in the emergency room she has only had 2 episodes of diarrhea.     Past Medical History:  Diagnosis Date  . HTN (hypertension)   . 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 Cardiac shock syndrome (HCC)   . Community acquired pneumonia   . Acute encephalopathy   . Acute pulmonary edema (HCC)   . Hypoxemia   . Hyperthyroidism 05/20/2019  . Elevated troponin 05/20/2019  . Hypokalemia 05/20/2019  . Acute respiratory failure with hypoxia (HCC) 05/20/2019  . Respiratory failure with hypoxia (HCC) 05/20/2019  . NSTEMI (non-ST elevated myocardial infarction) (HCC) 05/20/2019  . Unexplained weight loss 03/27/2019  . Chronic right shoulder pain 11/28/2018  . Prediabetes 12/05/2016  . Encounter for annual general medical examination with abnormal findings in adult 12/04/2015  . Hyperlipidemia 12/04/2015   . Essential hypertension 08/20/2015    Past Surgical History:  Procedure Laterality Date  . CESAREAN SECTION     x2  . CORONARY STENT INTERVENTION N/A 05/22/2019   Procedure: CORONARY STENT INTERVENTION;  Surgeon: Iran Ouch, MD;  Location: [facility] INVASIVE CV LAB;  Service: Cardiovascular;  Laterality: N/A;  . MASS EXCISION Right 01/18/2013   Procedure: EXCISION right scapular cyst;  Surgeon: Axel Filler, MD;  Location: [facility] ORS;  Service: General;  Laterality: Right;  . RIGHT/LEFT HEART CATH AND CORONARY ANGIOGRAPHY N/A 05/22/2019   Procedure: RIGHT/LEFT HEART CATH AND CORONARY ANGIOGRAPHY;  Surgeon: Iran Ouch, MD;  Location: [facility] INVASIVE CV LAB;  Service: Cardiovascular;  Laterality: N/A;     OB History   No obstetric history on file.      Home Medications    Prior to Admission medications   Medication Sig Start Date End Date Taking? Authorizing Provider  aspirin 81 MG chewable tablet Chew 1 tablet (81 mg total) by mouth daily. 05/26/19   Swayze, Ava, DO  atorvastatin (LIPITOR) 80 MG tablet Take 1 tablet (80 mg total) by mouth daily at 6 PM. 05/25/19   Swayze, Ava, DO  feeding supplement, GLUCERNA SHAKE, (GLUCERNA SHAKE) LIQD Take 237 mLs by mouth 3 (three) times daily between meals. 05/25/19   Swayze, Ava, DO  ferrous sulfate 325 (65 FE) MG tablet Take 325 mg by mouth every morning.     [provider]  furosemide (LASIX) 20 MG tablet Take 1 tablet (20 mg total) by  mouth 2 (two) times daily. 05/25/19 05/24/20  Swayze, Ava, DO  lisinopril (ZESTRIL) 5 MG tablet Take 1 tablet (5 mg total) by mouth daily. 05/26/19   Swayze, Ava, DO  methimazole (TAPAZOLE) 10 MG tablet Take 1 tablet (10 mg total) by mouth 2 (two) times daily. 05/25/19   Swayze, Ava, DO  metoprolol tartrate (LOPRESSOR) 25 MG tablet Take 1 tablet (25 mg total) by mouth 2 (two) times daily. 05/25/19   Swayze, Ava, DO  nitroGLYCERIN (NITROSTAT) 0.4 MG SL tablet Place 1 tablet (0.4 mg total) under the tongue every 5 (five)  minutes as needed for chest pain. 05/25/19 05/24/20  Swayze, Ava, DO  ticagrelor (BRILINTA) 90 MG TABS tablet Take 1 tablet (90 mg total) by mouth 2 (two) times daily. 05/25/19   Swayze, Ava, DO    Family History Family History  Problem Relation Age of Onset  . Diabetes Father   . Diabetes Mother   . Heart disease Mother   . Diabetes Sister   . Hypertension Sister     Social History Social History   Tobacco Use  . Smoking status: Never Smoker  . Smokeless tobacco: Never Used  Substance Use Topics  . Alcohol use: Yes    Alcohol/week: 0.0 standard drinks    Comment: rare  . Drug use: No     Allergies   Patient has no known allergies.   Review of Systems Review of Systems  Constitutional: Negative for chills and fever.  Respiratory: Negative for shortness of breath.   Cardiovascular: Negative for chest pain.  Gastrointestinal: Positive for diarrhea. Negative for abdominal pain, blood in stool, constipation, nausea and vomiting.  Genitourinary: Negative for decreased urine volume and difficulty urinating.  Musculoskeletal: Negative for arthralgias and myalgias.  Skin: Negative for rash and wound.  Allergic/Immunologic: Negative for immunocompromised state.  Neurological: Negative for dizziness and weakness.  Psychiatric/Behavioral: Negative for confusion.  All other systems reviewed and are negative.    Physical Exam Updated Vital Signs BP 123/67 (BP Location: Right Arm)   Pulse 74   Temp 97.8 F (36.6 C) (Oral)   Resp 16   LMP 06/19/2017 (Approximate)   SpO2 98%   Physical Exam Vitals signs and nursing note reviewed.  Constitutional:      General: She is not in acute distress.    Appearance: She is well-developed. She is not diaphoretic.  HENT:     Head: Normocephalic and atraumatic.  Cardiovascular:     Rate and Rhythm: Normal rate and regular rhythm.     Pulses: Normal pulses.     Heart sounds: Normal heart sounds.  Pulmonary:     Effort: Pulmonary  effort is normal.     Breath sounds: Normal breath sounds.  Abdominal:     Tenderness: There is no abdominal tenderness.     Comments: Ecchymosis to left lower quadrant from recent injection  Skin:    General: Skin is warm and dry.     Findings: No erythema or rash.  Neurological:     Mental Status: She is alert and oriented to person, place, and time.  Psychiatric:        Behavior: Behavior normal.      ED Treatments / Results  Labs (all labs ordered are listed, but only abnormal results are displayed) Labs Reviewed  BASIC METABOLIC PANEL - Abnormal; Notable for the following components:      Result Value   Glucose, Bld 105 (*)    Creatinine, Ser 0.41 (*)  All other components within normal limits  CBC WITH DIFFERENTIAL/PLATELET - Abnormal; Notable for the following components:   WBC 17.4 (*)    Hemoglobin 11.7 (*)    HCT 34.5 (*)    MCV 72.2 (*)    MCH 24.5 (*)    RDW 16.1 (*)    Platelets 457 (*)    Neutro Abs 10.8 (*)    Lymphs Abs 5.3 (*)    Abs Immature Granulocytes 0.09 (*)    All other components within normal limits  C DIFFICILE QUICK SCREEN W PCR REFLEX  GASTROINTESTINAL PANEL BY PCR, STOOL (REPLACES STOOL CULTURE)    EKG None  Radiology No results found.  Procedures Procedures (including critical care time)  Medications Ordered in ED Medications - No data to display   Initial Impression / Assessment and Plan / ED Course  I have reviewed the triage vital signs and the nursing notes.  Pertinent labs & imaging results that were available during my care of the patient were reviewed by me and considered in my medical decision making (see chart for details).  Clinical Course as of May 26 1541  Mon May 26, 7978  4738 48 year old female presents the emergency room with complaint of profuse watery brown diarrhea throughout the night, now improving.  Patient denies abdominal pain, nausea, vomiting, fevers or chills.  Patient presented to the hospital  for NSTEMI, was also given antibiotics for possible pneumonia.  Patient's COVID swab was negative.  Patient was advised while she was in the hospital she was exposed to a GI virus while in the radiology department.  Patient contacted PCP for virtual visit today however felt she needed labs and was instead directed to the emergency room.  On exam patient is well-appearing, her abdomen is soft and nontender.  Review of lab work, patient's BMP is unremarkable, her potassium is 3.7.  Patient's CBC shows a white blood cell count of 17.4, this is increased since her WBC at discharge, consistent with her previous WBC.  Discussed patient's lab changes, recommend that she follow-up with her cardiologist tomorrow as currently planned, may need PCP or cardiology to recheck CBC for WBC trend.  Patient's C. difficile test returns and is negative, GI bio fire pending.  Patient is aware this is a send out test and she will need to follow-up for results.  Patient continues to feel well, request to be discharged, was discharged with instructions for bland diet, may take OTC Imodium as directed if needed for her diarrhea however states she does not feel this to be necessary as her symptoms have improved.   [LM]    Clinical Course User Index [LM] Tacy Learn, PA-C      Final Clinical Impressions(s) / ED Diagnoses   Final diagnoses:  Diarrhea, unspecified type    ED Discharge Orders    None       Tacy Learn, PA-C 05/27/19 1542    Sherwood Gambler, MD 05/28/19 [phone]

## 2019-05-27 NOTE — Telephone Encounter (Signed)
[name] also noted:   FYI: [name]; c/o severe diarrhea; onset 8/9 @ 5:00 PM; recent d/c from hospital for MI; recent poss. Exposure to GI virus in Radiology Dept.    I spoke with pt and she was discharged from [facility] on 05/25/19 dx MI. Since 5 PM on 05/26/19 pt has had virtually continuous watery brown diarrhea; no blood seen. Pt said diarrhea so numerous she cannot count to give me an idea of how many times pt had diarrhea since 05/26/19 at 5 pm.pt denied symptoms of dehydration. No other covid symptoms except the diarrhea; advised pt should go to ED for urgent eval due to continuous watery diarrhea since 5 PM yesterday. No abd pain, fever or N&V. Pt said she wants virtual appt at [facility] because she knows she can drive up and get labs done at [facility]. I advised since pt has diarrhea which is a possible covid symptom pt could not come to [facility] for lab testing. Pt advised she does not want to go to ED and wants me to speak with a provider. Explained [name] is out of office but I will speak with a different provider. [name] is only provider with a 30' virtual appt left for today. [name] said pt needs ck of electrolytes and possible rapid stool test due to possible GI exposure at hospital for infectious organisms which the ED can get results back much quicker than we can and with pts watery diarrhea pt should not wait until 4 PM for virtual visit and lab results that would not be back to hour office until at least 05/28/19. Pt voiced understanding and I advised pt to let the ED know of possible GI exposure while pt was inpatient. Pt said can I not let them know instead of her having to tell them. I advised pt I would document in pts chart. Pt said OK. I asked pt which hospital ED she would be going to; pt told me the hospital that is 34' from her home and the one she was discharged from on 05/25/19. I thanked pt and asked if she was going to [facility] ED now and she said no that she did not feel  like driving 56' now but would go to ED later this morning. I offered to call ambulance for pt but pt declined. FYI to [name] and [name].

## 2019-05-27 NOTE — Telephone Encounter (Signed)
Noted. Per EMR, patient checked into ER.

## 2019-05-27 NOTE — Discharge Instructions (Addendum)
Follow up with your doctor tomorrow as planned.  Your WCB is elevated today, your doctor may want to recheck this tomorrow.  Your labs otherwise look ok, your electrolytes are good. Biofire stool and c. Diff tests pending. Follow up with your doctor for these results.  Return to the ER for worsening or concerning symptoms. Recommend bland diet (bananas, rice, applesauce, dry toast), advance slowly as tolerated.

## 2019-05-27 NOTE — ED Notes (Signed)
Pt provided hat to collect stool.

## 2019-05-28 ENCOUNTER — Telehealth (HOSPITAL_COMMUNITY): Payer: Self-pay

## 2019-05-28 ENCOUNTER — Encounter (HOSPITAL_COMMUNITY): Payer: Self-pay

## 2019-05-28 LAB — GASTROINTESTINAL PANEL BY PCR, STOOL (REPLACES STOOL CULTURE)

## 2019-05-28 NOTE — Telephone Encounter (Signed)
Pt insurance is active and benefits verified through [employer], DED $2,250/$1,264.77 met, out of pocket $4,000/$1,354.77 met, co-insurance 30%. no pre-authorization required, REF# [id number]  Will contact patient to see if she is interested in the Cardiac Rehab Program. If interested, patient will need to complete follow up appt. Once completed, patient will be contacted for scheduling upon review by the RN Navigator.

## 2019-05-28 NOTE — Telephone Encounter (Signed)
Please contact patient, how is she doing? Also need a hospital follow up scheduled for both visits and repeat labs.  Stool specimen was negative for infection.

## 2019-05-29 ENCOUNTER — Telehealth: Payer: Self-pay | Admitting: Cardiovascular Disease

## 2019-05-29 ENCOUNTER — Telehealth: Payer: Self-pay | Admitting: Primary Care

## 2019-05-29 NOTE — Telephone Encounter (Signed)
Message left for patient to return my call.  

## 2019-05-29 NOTE — Telephone Encounter (Signed)
Attempted to call patient. LMTCB 05/29/2019   

## 2019-05-29 NOTE — Telephone Encounter (Signed)
Best number [phone] °Pt returned your call °

## 2019-05-29 NOTE — Telephone Encounter (Signed)
Spoken to patient

## 2019-05-29 NOTE — Telephone Encounter (Signed)
Spoken to patient this morning. She stated that she is doing much better. No diarrhea at all. Notified patient of Tawni Millers comments. Patient requested to come in the office for the appointment. Got the okay from [place] and schedule patient for an appointment on 05/30/2019

## 2019-05-29 NOTE — Telephone Encounter (Signed)
-----   Message from Jeannette How sent at 05/24/2019  8:57 AM EDT ----- Please schedule for [facility] appointment.  ----- Message ----- From: Josue Hector, MD Sent: 05/24/2019   8:08 AM EDT To: Jeannette How  Needs outpatient TOC f/u post hospital d/c stent, CHF and thyroid storm

## 2019-05-29 NOTE — Telephone Encounter (Signed)
TCM....  Discharged 8/7  They saw nNshan / Fletcher Anon   They are scheduled to see Ryan 08/20 at 1030 .    They were seen for f/u post hospital d/c stent, CHF and thyroid storm   They need to be seen within 7-10 days      Please call

## 2019-05-30 ENCOUNTER — Ambulatory Visit (INDEPENDENT_AMBULATORY_CARE_PROVIDER_SITE_OTHER): Payer: Commercial Managed Care - PPO | Admitting: Primary Care

## 2019-05-30 ENCOUNTER — Encounter: Payer: Self-pay | Admitting: Primary Care

## 2019-05-30 ENCOUNTER — Other Ambulatory Visit: Payer: Self-pay

## 2019-05-30 VITALS — BP 118/72 | HR 73 | Temp 98.8°F | Ht 63.0 in | Wt 182.8 lb

## 2019-05-30 DIAGNOSIS — J189 Pneumonia, unspecified organism: Secondary | ICD-10-CM | POA: Diagnosis not present

## 2019-05-30 DIAGNOSIS — I214 Non-ST elevation (NSTEMI) myocardial infarction: Secondary | ICD-10-CM | POA: Diagnosis not present

## 2019-05-30 DIAGNOSIS — J810 Acute pulmonary edema: Secondary | ICD-10-CM

## 2019-05-30 DIAGNOSIS — I10 Essential (primary) hypertension: Secondary | ICD-10-CM | POA: Diagnosis not present

## 2019-05-30 DIAGNOSIS — E0590 Thyrotoxicosis, unspecified without thyrotoxic crisis or storm: Secondary | ICD-10-CM

## 2019-05-30 DIAGNOSIS — R570 Cardiogenic shock: Secondary | ICD-10-CM

## 2019-05-30 LAB — CBC
HCT: 30.6 % — ABNORMAL LOW (ref 36.0–46.0)
Hemoglobin: 10.1 g/dL — ABNORMAL LOW (ref 12.0–15.0)
MCHC: 32.9 g/dL (ref 30.0–36.0)
MCV: 73.4 fl — ABNORMAL LOW (ref 78.0–100.0)
Platelets: 337 10*3/uL (ref 150.0–400.0)
RBC: 4.17 Mil/uL (ref 3.87–5.11)
RDW: 17.1 % — ABNORMAL HIGH (ref 11.5–15.5)
WBC: 14 10*3/uL — ABNORMAL HIGH (ref 4.0–10.5)

## 2019-05-30 LAB — BASIC METABOLIC PANEL
BUN: 10 mg/dL (ref 6–23)
CO2: 26 mEq/L (ref 19–32)
Calcium: 9.1 mg/dL (ref 8.4–10.5)
Chloride: 110 mEq/L (ref 96–112)
Creatinine, Ser: 0.48 mg/dL (ref 0.40–1.20)
GFR: 137.49 mL/min (ref >60.00)
Glucose, Bld: 98 mg/dL (ref 70–99)
Potassium: 4.1 mEq/L (ref 3.5–5.1)
Sodium: 142 mEq/L (ref 135–145)

## 2019-05-30 NOTE — Patient Instructions (Signed)
Stop by the lab prior to leaving today. I will notify you of your results once received.   Follow up with cardiology as scheduled.  Continue to monitor your weight daily, call me if you see a gain of greater than 2 pounds in 24 hours or greater than 5 pounds in one week.  Continue your medications as prescribed.  It was a pleasure to see you today!

## 2019-05-30 NOTE — Assessment & Plan Note (Signed)
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 Also with NSTEMI with 80% occlusion to LAD, PCI to LAD.  Appears well today, compliant to prescribed regimen except for furosemide.

## 2019-05-30 NOTE — Assessment & Plan Note (Signed)
Recent hospital admission. 80% occlusion to LAD, PCI placed to LAD. Compliant to Brilinta, lisinopril, and metoprolol tartrate.  Plans on scheduling cardiac rehab. Follow-up for cardiology scheduled for next week.  Asymptomatic today.  No use of nitroglycerin. Return precautions provided.  Hospital notes, labs, imaging reviewed.

## 2019-05-30 NOTE — Assessment & Plan Note (Signed)
Stable in the office today.  Continue lisinopril, metoprolol tartrate.  Repeat BMP pending.

## 2019-05-30 NOTE — Progress Notes (Signed)
Subjective:    Patient ID: Amber [name], female    DOB: 06/22/1971, 48 y.o.   MRN: [id number]  HPI  Amber [name] is a 48 year old female who presents today for hospital follow up.  [facility] presented to [facility] ED on 05/20/2019 with a chief complaint of chest pain.  Her chest pain began earlier in the afternoon with palpitations and shortness of breath.  Chest pain located to left chest with radiation to left arm.  During her initial exam she was noted to be tachycardic.  Labs with troponin of 57 initially, repeat 2-hour later at 162.  CBC with leukocytosis.  EKG with ST changes.  She underwent chest x-ray which showed potential pneumonia.  She underwent CT chest which showed "atypical infection".  Room air oxygen saturation between 86 to 89%.  Given her symptoms and work-up she was admitted for further evaluation.  During her hospital stay she was treated with IV antibiotics, IV heparin, IV Lasix, IV fentanyl.  She underwent intubation on 05/21/19 due to increased work of breathing.  She underwent echocardiogram which showed LVEF of 20 to 25%, Takatsubo noted.  BNP of 137.  Initiated on IV beta-blockers initially, but heart failure and NSTEMI thought to be secondary to thyroid storm.  Later it was noted that she was not compliant to her hyperthyroidism treatment.  PTU and hydrocortisone was also initiated due to hyper thyroidism.  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  She was initiated on Brilinta.  She was extubated on 05/23/19, placed on BiPAP, pulmonary edema suspected.  Initiated on daily Lasix.  Lisinopril was initiated given echocardiogram results.  She was discharged home on 05/25/19 with recommendations for cardiology and PCP follow-up.  Since her discharge home she's been feeling better. She denies chest pain, diarrhea, shortness of breath, weight gain of 2+ pounds in 24 hours, lower extremity edema, cough,  fever. She has an appointment with cardiology on August 20th. She is planning on calling for cardiac rehabilitation today.   She is weighing herself daily at home and is weighing 182-183 lbs. She is not taking furosemide.  She plans on getting in touch with her endocrinologist.  She has resumed her hyperthyroidism medications.  Wt Readings from Last 3 Encounters:  05/30/19 182 lb 12 oz (82.9 kg)  05/25/19 177 lb 14.4 oz (80.7 kg)  04/03/19 178 lb 12.8 oz (81.1 kg)     BP Readings from Last 3 Encounters:  05/30/19 118/72  05/27/19 123/67  05/25/19 124/71     Review of Systems  Constitutional: Negative for fatigue and fever.  Eyes: Negative for visual disturbance.  Respiratory: Negative for cough and shortness of breath.   Cardiovascular: Negative for chest pain and palpitations.  Neurological: Negative for dizziness and headaches.       Past Medical History:  Diagnosis Date  . HTN (hypertension)   . 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 Marital status: Married    Spouse name: Not on file  . Number of children: 2  . Years of education: Not on file  . Highest education level: Not on file  Occupational History    Comment: front desk   Social Needs  . Financial resource strain: Not on file  . Food insecurity    Worry: Not on file    Inability: Not on file  . Transportation needs  Medical: Not on file    Non-medical: Not on file  Tobacco Use  . Smoking status: Never Smoker  . Smokeless tobacco: Never Used  Substance and Sexual Activity  . Alcohol use: Yes    Alcohol/week: 0.0 standard drinks    Comment: rare  . Drug use: No  . Sexual activity: Not on file  Lifestyle  . Physical activity    Days per week: Not on file    Minutes per session: Not on file  . Stress: Not on file  Relationships  . Social Herbalist on phone: Not on file    Gets together: Not on file    Attends religious service: Not on file     Active member of club or organization: Not on file    Attends meetings of clubs or organizations: Not on file    Relationship status: Not on file  . Intimate partner violence    Fear of current or ex partner: Not on file    Emotionally abused: Not on file    Physically abused: Not on file    Forced sexual activity: Not on file  Other Topics Concern  . Not on file  Social History Narrative   From [place]   Married.   2 children.   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    Past Surgical History:  Procedure Laterality Date  . CESAREAN SECTION     x2  . CORONARY STENT INTERVENTION N/A 05/22/2019   Procedure: CORONARY STENT INTERVENTION;  Surgeon: Wellington Hampshire, MD;  Location: [facility];  Service: Cardiovascular;  Laterality: N/A;  . MASS EXCISION Right 01/18/2013   Procedure: EXCISION right scapular cyst;  Surgeon: Ralene Ok, MD;  Location: [facility];  Service: General;  Laterality: Right;  . RIGHT/LEFT HEART CATH AND CORONARY ANGIOGRAPHY N/A 05/22/2019   Procedure: RIGHT/LEFT HEART CATH AND CORONARY ANGIOGRAPHY;  Surgeon: Wellington Hampshire, MD;  Location: [facility];  Service: Cardiovascular;  Laterality: N/A;    Family History  Problem Relation Age of Onset  . Diabetes Father   . Diabetes Mother   . Heart disease Mother   . Diabetes Sister   . Hypertension Sister     No Known Allergies  Current Outpatient Medications on File Prior to Visit  Medication Sig Dispense Refill  . aspirin 81 MG chewable tablet Chew 1 tablet (81 mg total) by mouth daily. 30 tablet 0  . atorvastatin (LIPITOR) 80 MG tablet Take 1 tablet (80 mg total) by mouth daily at 6 PM. 30 tablet 0  . feeding supplement, GLUCERNA SHAKE, (GLUCERNA SHAKE) LIQD Take 237 mLs by mouth 3 (three) times daily between meals. 237 mL 0  . ferrous sulfate 325 (65 FE) MG tablet Take 325 mg by mouth every morning.     Marland Kitchen lisinopril (ZESTRIL) 5  MG tablet Take 1 tablet (5 mg total) by mouth daily. 30 tablet 0  . methimazole (TAPAZOLE) 10 MG tablet Take 1 tablet (10 mg total) by mouth 2 (two) times daily. (Patient taking differently: Take 20 mg by mouth daily. ) 60 tablet 0  . metoprolol tartrate (LOPRESSOR) 25 MG tablet Take 1 tablet (25 mg total) by mouth 2 (two) times daily. 60 tablet 0  . ticagrelor (BRILINTA) 90 MG TABS tablet Take 1 tablet (90 mg total) by mouth 2 (two) times daily. 60 tablet 0  . furosemide (LASIX) 20 MG tablet  Take 1 tablet (20 mg total) by mouth 2 (two) times daily. (Patient not taking: Reported on 05/30/2019) 60 tablet 11  . nitroGLYCERIN (NITROSTAT) 0.4 MG SL tablet Place 1 tablet (0.4 mg total) under the tongue every 5 (five) minutes as needed for chest pain. (Patient not taking: Reported on 05/30/2019) 100 tablet 3   No current facility-administered medications on file prior to visit.     BP 118/72   Pulse 73   Temp 98.8 F (37.1 C) (Temporal)   Ht 5\' 3"  (1.6 m)   Wt 182 lb 12 oz (82.9 kg)   LMP 06/19/2017 (Approximate)   SpO2 98%   BMI 32.37 kg/m    Objective:   Physical Exam  Constitutional: She appears well-nourished.  Neck: Neck supple.  Cardiovascular: Normal rate and regular rhythm.  Respiratory: Effort normal and breath sounds normal.  Skin: Skin is warm and dry.  Psychiatric: She has a normal mood and affect.           Assessment & Plan:

## 2019-05-30 NOTE — Assessment & Plan Note (Signed)
Exam today with clear lungs, no cough or fever. Repeat CBC pending.

## 2019-05-30 NOTE — Assessment & Plan Note (Signed)
Diagnosed during recent hospital stay, echocardiogram with LVEF of 20 to 25%.  Diuresed with IV and oral Lasix during hospital stay, no longer taking furosemide now.  Appears euvolemic, daily weights stable.  No cough or abnormal breath sounds during exam.  Will defer continued furosemide to cardiology.

## 2019-05-30 NOTE — Assessment & Plan Note (Signed)
Diagnosed in June 2020 in our office. Following with endocrinology, has not been compliant to her prescribed medication. Discussed the importance of daily compliance. Follow-up with endocrinology as needed.

## 2019-05-31 NOTE — Telephone Encounter (Signed)
Patient contacted regarding discharge from [facility] on 05/25/2019.  Patient understands to follow up with provider Christell Faith, PA on Thursday 06/06/2019 at 10:30 am at the [facility] office. Patient understands discharge instructions? yes Patient understands medications and regiment? yes Patient understands to bring all medications to this visit? yes

## 2019-06-03 ENCOUNTER — Telehealth: Payer: Self-pay | Admitting: Primary Care

## 2019-06-03 ENCOUNTER — Encounter: Payer: Self-pay | Admitting: Physician Assistant

## 2019-06-03 ENCOUNTER — Other Ambulatory Visit: Payer: Self-pay

## 2019-06-03 NOTE — Telephone Encounter (Signed)
Was this within a 24 hour period or over the last four days? I recommend she start taking the furosemide 20 mg tablet once daily for now and follow up with the cardiologist as scheduled for later this week.

## 2019-06-03 NOTE — Telephone Encounter (Signed)
Patient is requesting a call back. She stated she was advised to call Gentry Fitz is something happened to her.  Patient did not go into detail just asked for call back

## 2019-06-03 NOTE — Progress Notes (Signed)
Cardiology Office Note    Date:  06/06/2019   ID:  Amber [name], DOB [birth year], MRN [id number]  PCP:  Doreene Nest, NP  Cardiologist:  Lorine Bears, MD  Electrophysiologist:  None   Chief Complaint: Hospital follow up  History of Present Illness:   Amber [name] is a 48 y.o. female with history of CAD as outlined below, recently diagnosed hyperthyroidism with thyroid storm previously noncompliant with methimazole, hypertension, anemia, and obesity who presents for hospital follow-up after recent admission to [facility] from 8/3 through 8/8 for acute hypoxic respiratory failure requiring mechanical ventilation in the setting of possible atypical pneumonia, non-STEMI, pulmonary edema, and hyperthyroidism with thyroid storm in the setting of medication noncompliance.  Patient was seen in 03/2019 with complaints of unexplained weight loss, palpitations, anxiety, insomnia, and shortness of breath with TSH being found to be suppressed at less than 0.01 with an elevated free T4 of 5.96.  In this setting she was started on methimazole.  She was admitted to [facility] earlier this month with development of central chest pain, palpitations, and shortness of breath requiring mechanical ventilation on 8/4 with subsequent extubation on 8/6.  EKG showed sinus tachycardia, 130s bpm, LVH, ST depression consistent with repolarization abnormalities.  D-dimer was less than 0.27.  High-sensitivity troponin was 57 with a delta troponin of 162, and peaking at 9688.  WBC was 19.8.  Chest CT revealed patchy groundglass infiltrates bilaterally predominantly within the bases.  COVID-19 test was negative.  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   Patient's cath had to be delayed until 8/5 in the setting of fever of 102.6 on 8/4.  Patient underwent right and left cardiac cath on 05/22/2019 which showed significant two-vessel CAD with 80% mid LAD stenosis, occluded OM branches and moderate RCA disease as outlined below.  Right heart catheterization showed high normal filling pressures with a mean RA pressure of 8 mmHg, pulmonary capillary wedge pressure of 12 to 13 mmHg, mild pulmonary hypertension and high cardiac output at 8 L/min.  The patient underwent successful PCI/DES to the mid LAD.  Documented discharge weight of 80.7 kg.  Following her discharge she was seen in the ED on 05/27/2019 with large-volume watery diarrhea following recent antibiotics for potential pneumonia during her admission above.  C. difficile and GI panel were negative.  Patient was recommended to follow-up with PCP as outpatient.  Patient was seen by PCP on 05/30/2019 and was feeling well.  Her weight remained stable around 182 to 183 pounds.  She had not taken any Lasix.  Patient comes in doing reasonably well today.  She denies any chest pain, shortness of breath, palpitations, dizziness, presyncope, or syncope.  She does report some bilateral pedal edema.  Her weight has slowly increased since her discharge from 182 pounds to her current weight of 187.4 pounds.  She denies any abdominal distention, orthopnea, PND, early satiety.  She is watching her salt intake and drinking less than 2 L of fluids daily.  Blood pressure typically runs in the 130s to 140s systolic.  She reports compliance with all medications including dual antiplatelet therapy.  She denies any falls, BRBPR, or melena.  She will be participating in cardiac rehab.  She has been out of work since her hospitalization.  No issues with the right radial cardiac cath site.   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   Thyroid storm     Past Surgical History:  Procedure Laterality Date   CESAREAN SECTION     x2   CORONARY STENT INTERVENTION N/A 05/22/2019   Procedure: CORONARY STENT INTERVENTION;  Surgeon: Wellington Hampshire, MD;  Location: [place] [facility];  Service: Cardiovascular;  Laterality: N/A;   MASS EXCISION Right 01/18/2013   Procedure: EXCISION right scapular cyst;  Surgeon: Ralene Ok, MD;  Location: [facility];  Service: General;  Laterality: Right;   RIGHT/LEFT HEART CATH AND CORONARY ANGIOGRAPHY N/A 05/22/2019   Procedure: RIGHT/LEFT HEART CATH AND CORONARY ANGIOGRAPHY;  Surgeon: Wellington Hampshire, MD;  Location: [place] [facility];  Service: Cardiovascular;  Laterality: N/A;    Current Medications: Current Meds  Medication Sig   aspirin 81 MG chewable tablet Chew 1 tablet (81 mg total) by mouth daily.   atorvastatin (LIPITOR) 80 MG tablet Take 1 tablet (80 mg total) by mouth daily at 6 PM.   calcium-vitamin D 250-100 MG-UNIT tablet Take 1 tablet by mouth 2 (two) times daily.   ferrous sulfate 325 (65 FE) MG tablet Take 325 mg by mouth every morning.    lisinopril (ZESTRIL) 5 MG tablet Take 1 tablet (5 mg total) by mouth daily.   methimazole (TAPAZOLE) 10 MG tablet Take 1 tablet (10 mg total) by mouth 2 (two) times daily.   metoprolol tartrate (LOPRESSOR) 25 MG tablet Take 1 tablet (25 mg total) by mouth 2 (two) times daily.   nitroGLYCERIN (NITROSTAT) 0.4 MG SL tablet Place 1 tablet (0.4 mg total) under the tongue every 5 (five) minutes as needed for chest pain.   ticagrelor (BRILINTA) 90 MG TABS tablet Take 1  tablet (90 mg total) by mouth 2 (two) times daily.    Allergies:   Patient has no known allergies.   Social History   Socioeconomic History   Marital status: Married    Spouse name: Not on file   Number of children: 2   Years of education: Not on file   Highest education level: Not on file  Occupational History    Comment: front desk   Social Needs   Financial resource strain: Not on file   Food insecurity    Worry: Not on file    Inability: Not on file   Transportation needs    Medical: Not on file    Non-medical: Not on file  Tobacco Use   Smoking status: Never Smoker   Smokeless tobacco: Never Used  Substance and Sexual Activity   Alcohol use: Yes    Alcohol/week: 0.0 standard drinks    Comment: rare   Drug use: No   Sexual activity: Not on file  Lifestyle   Physical activity    Days per week: Not on file    Minutes per session: Not on file   Stress: Not on file  Relationships   Social  connections    Talks on phone: Not on file    Gets together: Not on file    Attends religious service: Not on file    Active member of club or organization: Not on file    Attends meetings of clubs or organizations: Not on file    Relationship status: Not on file  Other Topics Concern   Not on file  Social History Narrative   From [place].   Married.   2 children.   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     Family History:  The patient's family history includes Diabetes in her father, mother, and sister; Heart disease in her mother; Hypertension in her sister.  ROS:   Review of Systems  Constitutional: Positive for malaise/fatigue. Negative for chills, diaphoresis, fever and weight loss.  HENT: Negative for congestion.   Eyes: Negative for discharge and redness.  Respiratory: Negative for cough, hemoptysis, sputum production, shortness of breath and wheezing.   Cardiovascular: Positive for  leg swelling. Negative for chest pain, palpitations, orthopnea, claudication and PND.       Bilateral pedal edema  Gastrointestinal: Negative for abdominal pain, blood in stool, heartburn, melena, nausea and vomiting.  Genitourinary: Negative for hematuria.  Musculoskeletal: Negative for falls and myalgias.  Skin: Negative for rash.  Neurological: Negative for dizziness, tingling, tremors, sensory change, speech change, focal weakness, loss of consciousness and weakness.  Endo/Heme/Allergies: Does not bruise/bleed easily.  Psychiatric/Behavioral: Negative for substance abuse. The patient is not nervous/anxious.      EKGs/Labs/Other Studies Reviewed:    Studies reviewed were summarized above. The additional studies were reviewed today:  R/LHC 05/22/2019:  1st Mrg lesion is 100% stenosed.  2nd Mrg lesion is 100% stenosed.  Mid LAD lesion is 80% stenosed.  Post intervention, there is a 0% residual stenosis.  A drug-eluting stent was successfully placed using a STENT RESOLUTE ONYX 3.0X18.  2nd Diag lesion is 30% stenosed.  Prox RCA to Mid RCA lesion is 50% stenosed.  RPAV lesion is 40% stenosed.  RPDA lesion is 30% stenosed.   1.  Significant two-vessel coronary artery disease with 80% mid LAD stenosis, occluded OM branches and moderate RCA disease. 2.  Left ventricular angiography was not performed.  EF was severely reduced by echo. 3.  Right heart catheterization showed high normal filling pressures with mean RA pressure of 8 mmHg, pulmonary capillary wedge pressure of 12 to 13 mmHg, mild pulmonary hypertension and high cardiac output at 8 L/min. 4.  Successful angioplasty and drug-eluting stent placement to the mid LAD.  Recommendations: Continue dual antiplatelet therapy for at least 1 year. The patient needs aggressive medical therapy for the rest of her coronary artery disease. I added high-dose atorvastatin. I added oral metoprolol to control tachycardia. I discontinued  IV furosemide for now and she might require resumption of this either intravenously or orally as needed. The patient was alert throughout the procedure .  Her hemodynamics are good enough to allow extubation if no other issues. __________  2D Echo 05/21/2019: 1. The left ventricle has severely reduced systolic function, with an ejection fraction of 25-30%. The cavity size was moderately dilated. Left ventricular diastolic parameters were normal.  2. 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  3. The right ventricle has normal systolic function. The cavity was normal. There is no increase in right ventricular wall thickness.  4. The aortic valve is tricuspid. Mild thickening  of the aortic valve.  5. The aorta is normal in size and structure.   EKG:  EKG is ordered today.  The EKG ordered today demonstrates NSR, 69 bpm, rare PVC, LVH, anterior lateral T wave inversion consistent with prior known MI  Recent Labs: 05/20/2019: ALT 15; B Natriuretic Peptide 137.5 05/24/2019: Magnesium 2.0 05/30/2019: BUN 10; Creatinine, Ser 0.48; Hemoglobin 10.1; Platelets 337.0; Potassium 4.1; Sodium 142 06/04/2019: TSH <0.01 Repeated and verified X2.  Recent Lipid Panel    Component Value Date/Time   CHOL 132 11/21/2018 0911   TRIG 69 05/21/2019 0512   HDL 32.10 (L) 11/21/2018 0911   CHOLHDL 4 11/21/2018 0911   VLDL 18.2 11/21/2018 0911   LDLCALC 82 11/21/2018 0911    PHYSICAL EXAM:    VS:  BP (!) 144/82    Pulse 74    Ht 5\' 4"  (1.626 m)    Wt 187 lb 6.4 oz (85 kg)    LMP 06/19/2017 (Approximate)    SpO2 98%    BMI 32.17 kg/m   BMI: Body mass index is 32.17 kg/m.  Physical Exam  Constitutional: She is oriented to person, place, and time. She appears well-developed and well-nourished.  HENT:  Head: Normocephalic and atraumatic.  Eyes: Right eye exhibits no discharge. Left eye exhibits no discharge.  Neck: Normal range of motion. No JVD present.  Cardiovascular: Normal rate,  regular rhythm, S1 normal, S2 normal and normal heart sounds. Exam reveals no distant heart sounds, no friction rub, no midsystolic click and no opening snap.  No murmur heard. Pulses:      Dorsalis pedis pulses are 2+ on the right side and 2+ on the left side.       Posterior tibial pulses are 2+ on the right side and 2+ on the left side.  Right radial cardiac cath site is well-healing without any active bleeding, bruising, swelling, warmth, erythema, or tenderness to palpation.  Radial pulse 2+.  Pulmonary/Chest: Effort normal and breath sounds normal. No respiratory distress. She has no decreased breath sounds. She has no wheezes. She has no rales. She exhibits no tenderness.  Abdominal: Soft. She exhibits no distension. There is no abdominal tenderness.  Musculoskeletal:        General: No edema.  Neurological: She is alert and oriented to person, place, and time.  Skin: Skin is warm and dry. No cyanosis. Nails show no clubbing.  Psychiatric: She has a normal mood and affect. Her speech is normal and behavior is normal. Judgment and thought content normal.    Wt Readings from Last 3 Encounters:  06/06/19 187 lb 6.4 oz (85 kg)  06/04/19 185 lb (83.9 kg)  05/30/19 182 lb 12 oz (82.9 kg)     ASSESSMENT & PLAN:   1. CAD involving native coronary arteries: She is doing well without any symptoms concerning for angina.  Continue secondary prevention with aspirin and Brilinta along with Lipitor and Lopressor.  She reports compliance with antiplatelet therapy.  Importance of dual antiplatelet therapy was discussed in detail with patient.  She will be enrolling in cardiac rehab.  Given patient's recent MI and acute systolic CHF she will remain out of work through September, 2020.  Check CBC.  2. 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  It appears she was supposed  to be discharged with Lasix 20 mg daily by discharge summary though this was not at the pharmacy for her to pick up.  I have had a lengthy discussion with the patient regarding CHF education and diet.  She will be started on Lasix 20 mg daily.  With this, we will obtain a BMP today and again in 1 week to ensure stable renal function.  Given the addition of Lasix today we will hold off on transitioning her from lisinopril to potentially Entresto given her cardiomyopathy.  We will likely undertake this at her follow-up visit in 2 weeks.  At that time, we should also consider transitioning her from Lopressor to either Toprol-XL or carvedilol given her cardiomyopathy.  Following this, if vital signs and lab work allow we will undertake addition of spironolactone to optimize her heart failure therapy.  Patient will need to undergo follow-up echo in 08/2019 to evaluate for improvement in her EF following revascularization and optimization of evidence-based heart failure therapy.  If EF remains less than 35% at that time she will need to be referred to EP for consideration of ICD.  3. Hyperthyroidism: Followed by endocrinology.  Reports compliance with methimazole.  4. Hypertension: Blood pressure is suboptimally controlled at 144/82 today.  Add Lasix as above.  Otherwise, she will continue current doses of Lopressor and lisinopril.  5. Hyperlipidemia: LDL of 82 from 11/2018.  Patient was not on Lipitor at that time, this was started at time of her MI earlier this month.  Goal LDL less than 70.  Recheck fasting lipid and liver function in approximately 2 months.  Disposition: F/u with Dr. Kirke CorinArida in 2 weeks.   Medication Adjustments/Labs and Tests Ordered: Current medicines are reviewed at length with the patient today.  Concerns regarding medicines are outlined above. Medication changes, Labs and Tests ordered today are summarized above and listed in  the Patient Instructions accessible in Encounters.   Signed, Eula Listenyan Raymir Frommelt, PA-C 06/06/2019 10:10 AM     [facility] [address] [place], [place] [address] [phone]

## 2019-06-03 NOTE — Telephone Encounter (Signed)
Patient stated that she stated that Anda Kraft told patient to call if she noticed any weight changes. Patient stated that since we saw patient, she had gain 3 lbs.

## 2019-06-04 ENCOUNTER — Ambulatory Visit: Payer: Commercial Managed Care - PPO | Admitting: Internal Medicine

## 2019-06-04 ENCOUNTER — Encounter: Payer: Self-pay | Admitting: Internal Medicine

## 2019-06-04 VITALS — BP 132/84 | HR 59 | Temp 98.5°F | Ht 64.0 in | Wt 185.0 lb

## 2019-06-04 DIAGNOSIS — E0500 Thyrotoxicosis with diffuse goiter without thyrotoxic crisis or storm: Secondary | ICD-10-CM | POA: Diagnosis not present

## 2019-06-04 DIAGNOSIS — E0590 Thyrotoxicosis, unspecified without thyrotoxic crisis or storm: Secondary | ICD-10-CM | POA: Diagnosis not present

## 2019-06-04 HISTORY — DX: Thyrotoxicosis with diffuse goiter without thyrotoxic crisis or storm: E05.00

## 2019-06-04 LAB — T4, FREE: Free T4: 1.4 ng/dL (ref 0.60–1.60)

## 2019-06-04 LAB — TSH: TSH: <0.01 u[IU]/mL — ABNORMAL LOW (ref 0.35–4.50)

## 2019-06-04 NOTE — Telephone Encounter (Signed)
Noted. Will have her move forward with daily furosemide 20 mg as mentioned below.

## 2019-06-04 NOTE — Telephone Encounter (Signed)
Patient returned Chan's call.  Patient can be reached at [phone].

## 2019-06-04 NOTE — Patient Instructions (Signed)
We recommend that you follow these hyperthyroidism instructions at home:  1) Take Methimazole 10 mg tablets, two tablets daily   If you develop severe sore throat with high fevers OR develop unexplained yellowing of your skin, eyes, under your tongue, severe abdominal pain with nausea or vomiting --> then please get evaluated immediately.  2) Get repeat thyroid labs 6 weeks.   It is ESSENTIAL to get follow-up labs to help avoid over or undertreatment of your hyperthyroidism - both of which can be dangerous to your health.

## 2019-06-04 NOTE — Telephone Encounter (Signed)
Message left for patient to return my call.  

## 2019-06-04 NOTE — Telephone Encounter (Signed)
Spoken and notified patient of Tawni Millers comments. Patient stated that it is over the last 4 days.

## 2019-06-04 NOTE — Progress Notes (Signed)
Name: Amber [name]  MRN/ DOB: [id number], 02-19-1971    Age/ Sex: 48 y.o., female     PCP: [name], NP   Reason for Endocrinology Evaluation: Hyperthytroidism     Initial Endocrinology Clinic Visit: 04/03/2019    PATIENT IDENTIFIER: [name] is a 48 y.o., female with a past medical history of HTN and CAD (S/P PCI 05/2019).She has followed with [facility] since 04/03/2019 for consultative assistance with management of her Hyperthyrodism  HISTORICAL SUMMARY: The patient was first diagnosed with hyperthyroidism in 03/2019, with a suppressed TSH at < 0.01 uIU/mL with elevated FT4 5.96 ng/dL. She did have weight loss  For ~ 4 months prior to her presentation.  Methimazole started in 03/2019  SUBJECTIVE:   During last visit (04/03/2019): Started on methimazole and Atenolol   Today (06/04/2019):  Amber [name] is here for a follow up on hyperthyroidism. Since her last visit here, she has been diagnosed with NSTMI. She is S/P PCI with stent placement. Brother decreased in his 27's.    She has noted weight gain since her last vsiti here, noted minimal LE swelling.  No palpitations or chest pain   No burring or itching in the eyes No local neck symptoms  No biotin    ROS:  As per HPI.   HISTORY:  Past Medical History:  Past Medical History:  Diagnosis Date  . Acute encephalopathy   . Anemia   . CAD (coronary artery disease)   . HFrEF (heart failure with reduced ejection fraction) ([place])   . HTN (hypertension)   . Hyperthyroidism   . 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 Thyroid storm    Past Surgical History:  Past Surgical History:  Procedure Laterality Date  . CESAREAN SECTION     x2  . CORONARY STENT INTERVENTION N/A 05/22/2019   Procedure: CORONARY STENT INTERVENTION;  Surgeon: Wellington Hampshire, MD;  Location: [place] [facility];  Service: Cardiovascular;  Laterality: N/A;  . MASS EXCISION Right 01/18/2013   Procedure: EXCISION right  scapular cyst;  Surgeon: Ralene Ok, MD;  [facility];  Service: General;  Laterality: Right;  . RIGHT/LEFT HEART CATH AND CORONARY ANGIOGRAPHY N/A 05/22/2019   Procedure: RIGHT/LEFT HEART CATH AND CORONARY ANGIOGRAPHY;  Surgeon: Wellington Hampshire, MD;  Location: [place] [facility];  Service: Cardiovascular;  Laterality: N/A;    Social History:  reports that she has never smoked. She has never used smokeless tobacco. She reports current alcohol use. She reports that she does not use drugs. Family History:  Family History  Problem Relation Age of Onset  . Diabetes Father   . Diabetes Mother   . Heart disease Mother   . Diabetes Sister   . Hypertension Sister      HOME MEDICATIONS: Allergies as of 06/04/2019   No Known Allergies     Medication List       Accurate as of June 04, 2019  3:33 PM. If you have any questions, ask your nurse or doctor.        STOP taking these medications   feeding supplement (GLUCERNA SHAKE) Liqd Stopped by: Dorita Sciara, MD     TAKE these medications   aspirin 81 MG chewable tablet Chew 1 tablet (81 mg total) by mouth daily.   atorvastatin 80 MG tablet Commonly known as: LIPITOR Take 1 tablet (80 mg total) by mouth daily at 6 PM.   calcium-vitamin D 250-100 MG-UNIT tablet Take 1 tablet by mouth  2 (two) times daily.   ferrous sulfate 325 (65 FE) MG tablet Take 325 mg by mouth every morning.   furosemide 20 MG tablet Commonly known as: Lasix Take 1 tablet (20 mg total) by mouth 2 (two) times daily.   lisinopril 5 MG tablet Commonly known as: ZESTRIL Take 1 tablet (5 mg total) by mouth daily.   methimazole 10 MG tablet Commonly known as: TAPAZOLE Take 1 tablet (10 mg total) by mouth 2 (two) times daily. What changed:   how much to take  when to take this   metoprolol tartrate 25 MG tablet Commonly known as: LOPRESSOR Take 1 tablet (25 mg total) by mouth 2 (two) times daily.   nitroGLYCERIN 0.4 MG SL tablet  Commonly known as: Nitrostat Place 1 tablet (0.4 mg total) under the tongue every 5 (five) minutes as needed for chest pain.   ticagrelor 90 MG Tabs tablet Commonly known as: BRILINTA Take 1 tablet (90 mg total) by mouth 2 (two) times daily.         OBJECTIVE:   PHYSICAL EXAM: VS: BP 132/84 (BP Location: Left Arm, Patient Position: Sitting, Cuff Size: Large)   Pulse (!) 59   Temp 98.5 F (36.9 C)   Ht 5\' 4"  (1.626 m)   Wt 185 lb (83.9 kg)   LMP 06/19/2017 (Approximate)   SpO2 98%   BMI 31.76 kg/m    EXAM: General: Pt appears well and is in NAD  Eyes: External eye exam with a stare, but no lid lag or exophthalmos.  EOM intact.    Neck: General: Supple without adenopathy. Thyroid: Thyroid size normal.  No goiter or nodules appreciated. No thyroid bruit.  Lungs: Clear with good BS bilat with no rales, rhonchi, or wheezes  Heart: Auscultation: RRR.  Abdomen: Normoactive bowel sounds, soft, nontender, without masses or organomegaly palpable  Extremities:  BL LE: No pretibial edema normal ROM and strength.  Mental Status: Mood and affect: No depression, anxiety, or agitation     DATA REVIEWED: Results for PEYSON, COPADO (MRN [id number]) as of 06/05/2019 08:46  Ref. Range 06/04/2019 11:01  TSH Latest Ref Range: 0.35 - 4.50 uIU/mL <0.01 Repeated and verified X2. (L)  T4,Free(Direct) Latest Ref Range: 0.60 - 1.60 ng/dL 9.92      ASSESSMENT / PLAN / RECOMMENDATIONS:   1. Hyperthyroidism Secondary to Graves' Disease:  - Clinically euthyroid - No local neck symptoms  - She has been compliant with methimazole without side effects  - FT4 has normalized, will continue current dose, as TSH lags behind  Medications  Methimazole 10 mg BID    2. Graves' Disease:   - Pt advised to have an ophthalmological exam for graves' disease.    Labs in 6 weeks  F/U in 3 months   Signed electronically by: Lyndle Herrlich, MD  [facility]  [facility] [address] [place] [place], [place] [address] Phone: [phone] FAX: [phone]      CC: Doreene Nest, NP [address] [place] [address] Phone: [phone]  Fax: [phone]   Return to Endocrinology clinic as below: Future Appointments  Date Time Provider Department Center  06/06/2019 10:30 AM Sondra Barges, PA-C [facility]  07/15/2019 10:45 AM [facility] None  09/04/2019  9:10 AM Amirrah Quigley, Konrad Dolores, MD [facility] None

## 2019-06-05 ENCOUNTER — Encounter: Payer: Self-pay | Admitting: Internal Medicine

## 2019-06-05 MED ORDER — METHIMAZOLE 10 MG PO TABS: 10.0000 mg | ORAL_TABLET | Freq: Two times a day (BID) | ORAL | 6 refills | Status: DC

## 2019-06-06 ENCOUNTER — Ambulatory Visit (INDEPENDENT_AMBULATORY_CARE_PROVIDER_SITE_OTHER): Payer: Commercial Managed Care - PPO | Admitting: Physician Assistant

## 2019-06-06 ENCOUNTER — Other Ambulatory Visit: Payer: Self-pay

## 2019-06-06 ENCOUNTER — Telehealth: Payer: Self-pay

## 2019-06-06 ENCOUNTER — Other Ambulatory Visit
Admission: RE | Admit: 2019-06-06 | Discharge: 2019-06-06 | Disposition: A | Discharge status: Home / Self Care | Payer: Commercial Managed Care - PPO | Source: Ambulatory Visit | Attending: Physician Assistant | Admitting: Physician Assistant

## 2019-06-06 ENCOUNTER — Encounter: Payer: Self-pay | Admitting: Physician Assistant

## 2019-06-06 VITALS — BP 144/82 | HR 74 | Ht 64.0 in | Wt 187.4 lb

## 2019-06-06 DIAGNOSIS — I5022 Chronic systolic (congestive) heart failure: Secondary | ICD-10-CM

## 2019-06-06 DIAGNOSIS — I10 Essential (primary) hypertension: Secondary | ICD-10-CM

## 2019-06-06 DIAGNOSIS — I255 Ischemic cardiomyopathy: Secondary | ICD-10-CM | POA: Diagnosis not present

## 2019-06-06 DIAGNOSIS — E0590 Thyrotoxicosis, unspecified without thyrotoxic crisis or storm: Secondary | ICD-10-CM

## 2019-06-06 DIAGNOSIS — I2510 Atherosclerotic heart disease of native coronary artery without angina pectoris: Secondary | ICD-10-CM

## 2019-06-06 DIAGNOSIS — E785 Hyperlipidemia, unspecified: Secondary | ICD-10-CM

## 2019-06-06 LAB — BASIC METABOLIC PANEL
Anion gap: 6 (ref 5–15)
BUN: 9 mg/dL (ref 6–20)
CO2: 26 mmol/L (ref 22–32)
Calcium: 9.1 mg/dL (ref 8.9–10.3)
Chloride: 107 mmol/L (ref 98–111)
Creatinine, Ser: 0.41 mg/dL — ABNORMAL LOW (ref 0.44–1.00)
GFR calc Af Amer: >60 mL/min (ref >60)
GFR calc non Af Amer: >60 mL/min (ref >60)
Glucose, Bld: 103 mg/dL — ABNORMAL HIGH (ref 70–99)
Potassium: 4.3 mmol/L (ref 3.5–5.1)
Sodium: 139 mmol/L (ref 135–145)

## 2019-06-06 MED ORDER — ASPIRIN 81 MG PO CHEW: 81.0000 mg | CHEWABLE_TABLET | Freq: Every day | ORAL | 11 refills | Status: AC

## 2019-06-06 MED ORDER — TICAGRELOR 90 MG PO TABS: 90.0000 mg | ORAL_TABLET | Freq: Two times a day (BID) | ORAL | 11 refills | Status: DC

## 2019-06-06 MED ORDER — NITROGLYCERIN 0.4 MG SL SUBL: 0.4000 mg | SUBLINGUAL_TABLET | SUBLINGUAL | 3 refills | Status: DC | PRN

## 2019-06-06 MED ORDER — ATORVASTATIN CALCIUM 80 MG PO TABS: 80.0000 mg | ORAL_TABLET | Freq: Every day | ORAL | 11 refills | Status: DC

## 2019-06-06 MED ORDER — METOPROLOL TARTRATE 25 MG PO TABS: 25.0000 mg | ORAL_TABLET | Freq: Two times a day (BID) | ORAL | 11 refills | Status: DC

## 2019-06-06 MED ORDER — LISINOPRIL 5 MG PO TABS: 5.0000 mg | ORAL_TABLET | Freq: Every day | ORAL | 11 refills | Status: DC

## 2019-06-06 MED ORDER — FUROSEMIDE 20 MG PO TABS: 20.0000 mg | ORAL_TABLET | Freq: Every day | ORAL | 3 refills | Status: DC

## 2019-06-06 NOTE — Addendum Note (Signed)
Addended by: Verlon Au on: 06/06/2019 12:27 PM   Modules accepted: Orders

## 2019-06-06 NOTE — Telephone Encounter (Signed)
-----   Message from Rise Mu, [place] sent at 06/06/2019 12:06 PM EDT ----- Renal function and potassium are stable and at goal. Continue with planned starting of Lasix with recheck BMP in 1 week. Follow up in 2 weeks as planned for further optimization of medical therapy.

## 2019-06-06 NOTE — Patient Instructions (Signed)
Medication Instructions:  1- START Lasix Take 1 tablet (20 mg total) by mouth daily.  If you need a refill on your cardiac medications before your next appointment, please call your pharmacy.   Lab work: 1- Your physician recommends that you return for lab work [id number] at [facility]. (BM[facility])  2- Your physician recommends that you return for lab work in: 1 week at [facility]. No appt is needed. Hours are M-F 7AM- 6 PM. (BM[facility])  If you have labs (blood work) drawn today and your tests are completely normal, you will receive your results only by: Marland Kitchen MyChart Message (if you have MyChart) OR . A paper copy in the mail If you have any lab test that is abnormal or we need to change your treatment, we will call you to review the results.  Testing/Procedures: None ordered   Follow-Up: At [facility], you and your health needs are our priority.  As part of our continuing mission to provide you with exceptional heart care, we have created designated Provider Care Teams.  These Care Teams include your primary Cardiologist (physician) and Advanced Practice Providers (APPs -  Physician Assistants and Nurse Practitioners) who all work together to provide you with the care you need, when you need it. You will need a follow up appointment in 2 weeks.  You may see [name], MD or Christell Faith, PA-C.

## 2019-06-06 NOTE — Telephone Encounter (Signed)
Patient made aware of lab results and Ryan Dunn, PA recommendation with verbalized understanding. 

## 2019-06-06 NOTE — Telephone Encounter (Signed)
Spoken to patient and she stated that [facility] does not have the furosemide 20 mg. Patient asked if Anda Kraft can send some to [facility] until she sees the cardio

## 2019-06-06 NOTE — Telephone Encounter (Signed)
It appears that furosemide 20 mg was sent to patient's pharmacy today per cardiology.

## 2019-06-07 ENCOUNTER — Other Ambulatory Visit: Payer: Self-pay | Admitting: Internal Medicine

## 2019-06-07 ENCOUNTER — Telehealth: Payer: Self-pay | Admitting: Primary Care

## 2019-06-07 NOTE — Telephone Encounter (Signed)
Best number [phone] Pt returned your call

## 2019-06-11 NOTE — Telephone Encounter (Signed)
Spoken to patient on 06/07/2019. Inform her that [facility] stated that patient had pick the furosemide on 05/20/2019. Patient stated that she did not and will go to [facility] and talk to them.

## 2019-06-13 ENCOUNTER — Other Ambulatory Visit
Admission: RE | Admit: 2019-06-13 | Discharge: 2019-06-13 | Disposition: A | Discharge status: Home / Self Care | Payer: Commercial Managed Care - PPO | Source: Ambulatory Visit | Attending: Physician Assistant | Admitting: Physician Assistant

## 2019-06-13 ENCOUNTER — Telehealth: Payer: Self-pay | Admitting: *Deleted

## 2019-06-13 DIAGNOSIS — I2510 Atherosclerotic heart disease of native coronary artery without angina pectoris: Secondary | ICD-10-CM | POA: Insufficient documentation

## 2019-06-13 LAB — BASIC METABOLIC PANEL
Anion gap: 8 (ref 5–15)
BUN: 13 mg/dL (ref 6–20)
CO2: 25 mmol/L (ref 22–32)
Calcium: 9.1 mg/dL (ref 8.9–10.3)
Chloride: 108 mmol/L (ref 98–111)
Creatinine, Ser: 0.51 mg/dL (ref 0.44–1.00)
GFR calc Af Amer: >60 mL/min (ref >60)
GFR calc non Af Amer: >60 mL/min (ref >60)
Glucose, Bld: 160 mg/dL — ABNORMAL HIGH (ref 70–99)
Potassium: 3.6 mmol/L (ref 3.5–5.1)
Sodium: 141 mmol/L (ref 135–145)

## 2019-06-13 MED ORDER — POTASSIUM CHLORIDE CRYS ER 20 MEQ PO TBCR: 20.0000 meq | EXTENDED_RELEASE_TABLET | Freq: Every day | ORAL | 3 refills | Status: DC

## 2019-06-13 NOTE — Telephone Encounter (Signed)
Results called to pt. Pt verbalized understanding of results and to start kdur 20 meQ daily.  Rx sent to pharmacy.

## 2019-06-13 NOTE — Telephone Encounter (Signed)
-----   Message from Theora Gianotti, NP sent at 06/13/2019  1:56 PM EDT ----- Renal fxn stable.  Potassium a little low.  Pls add kdur 20meq daily.

## 2019-06-14 ENCOUNTER — Encounter: Payer: Self-pay | Admitting: *Deleted

## 2019-06-14 ENCOUNTER — Other Ambulatory Visit: Payer: Self-pay

## 2019-06-14 ENCOUNTER — Encounter: Payer: Commercial Managed Care - PPO | Attending: Cardiovascular Disease | Admitting: *Deleted

## 2019-06-14 DIAGNOSIS — Z79899 Other long term (current) drug therapy: Secondary | ICD-10-CM | POA: Insufficient documentation

## 2019-06-14 DIAGNOSIS — I214 Non-ST elevation (NSTEMI) myocardial infarction: Secondary | ICD-10-CM | POA: Insufficient documentation

## 2019-06-14 DIAGNOSIS — Z7901 Long term (current) use of anticoagulants: Secondary | ICD-10-CM | POA: Insufficient documentation

## 2019-06-14 DIAGNOSIS — Z955 Presence of coronary angioplasty implant and graft: Secondary | ICD-10-CM | POA: Insufficient documentation

## 2019-06-14 DIAGNOSIS — E0590 Thyrotoxicosis, unspecified without thyrotoxic crisis or storm: Secondary | ICD-10-CM | POA: Insufficient documentation

## 2019-06-14 DIAGNOSIS — G9340 Encephalopathy, unspecified: Secondary | ICD-10-CM | POA: Insufficient documentation

## 2019-06-14 DIAGNOSIS — I2510 Atherosclerotic heart disease of native coronary artery without angina pectoris: Secondary | ICD-10-CM | POA: Insufficient documentation

## 2019-06-14 DIAGNOSIS — I5020 Unspecified systolic (congestive) heart failure: Secondary | ICD-10-CM | POA: Insufficient documentation

## 2019-06-14 DIAGNOSIS — Z7982 Long term (current) use of aspirin: Secondary | ICD-10-CM | POA: Insufficient documentation

## 2019-06-14 DIAGNOSIS — E0591 Thyrotoxicosis, unspecified with thyrotoxic crisis or storm: Secondary | ICD-10-CM | POA: Insufficient documentation

## 2019-06-14 NOTE — Progress Notes (Signed)
Virtual Initial Orientation completed. Diagnosis can be found in [facility] 8/3. EP/RD orientation scheduled for 8/31 at 1pm

## 2019-06-17 ENCOUNTER — Other Ambulatory Visit: Payer: Self-pay

## 2019-06-17 VITALS — Ht 64.0 in | Wt 188.9 lb

## 2019-06-17 DIAGNOSIS — Z79899 Other long term (current) drug therapy: Secondary | ICD-10-CM | POA: Diagnosis not present

## 2019-06-17 DIAGNOSIS — Z955 Presence of coronary angioplasty implant and graft: Secondary | ICD-10-CM

## 2019-06-17 DIAGNOSIS — I214 Non-ST elevation (NSTEMI) myocardial infarction: Secondary | ICD-10-CM

## 2019-06-17 DIAGNOSIS — Z7982 Long term (current) use of aspirin: Secondary | ICD-10-CM | POA: Diagnosis not present

## 2019-06-17 DIAGNOSIS — I2510 Atherosclerotic heart disease of native coronary artery without angina pectoris: Secondary | ICD-10-CM | POA: Diagnosis not present

## 2019-06-17 DIAGNOSIS — G9340 Encephalopathy, unspecified: Secondary | ICD-10-CM | POA: Diagnosis not present

## 2019-06-17 DIAGNOSIS — Z7901 Long term (current) use of anticoagulants: Secondary | ICD-10-CM | POA: Diagnosis not present

## 2019-06-17 DIAGNOSIS — E0590 Thyrotoxicosis, unspecified without thyrotoxic crisis or storm: Secondary | ICD-10-CM | POA: Diagnosis not present

## 2019-06-17 DIAGNOSIS — E0591 Thyrotoxicosis, unspecified with thyrotoxic crisis or storm: Secondary | ICD-10-CM | POA: Diagnosis not present

## 2019-06-17 DIAGNOSIS — I5020 Unspecified systolic (congestive) heart failure: Secondary | ICD-10-CM | POA: Diagnosis not present

## 2019-06-17 NOTE — Progress Notes (Signed)
Cardiac Individual Treatment Plan  Patient Details  Name: Amber [name] MRN: [id number] Date of Birth: [birth year] Referring Provider:     Cardiac Rehab from 06/17/2019 in [facility] Cardiac and Pulmonary Rehab  Referring Provider  Arida      Initial Encounter Date:    Cardiac Rehab from 06/17/2019 in [facility] Cardiac and Pulmonary Rehab  Date  06/17/19      Visit Diagnosis: NSTEMI (non-ST elevated myocardial infarction) [facility])  Status post coronary artery stent placement  Patient's Home Medications on Admission:  Current Outpatient Medications:  .  aspirin 81 MG chewable tablet, Chew 1 tablet (81 mg total) by mouth daily., Disp: 30 tablet, Rfl: 11 .  atorvastatin (LIPITOR) 80 MG tablet, Take 1 tablet (80 mg total) by mouth daily at 6 PM., Disp: 30 tablet, Rfl: 11 .  calcium-vitamin D 250-100 MG-UNIT tablet, Take 1 tablet by mouth 2 (two) times daily., Disp: , Rfl:  .  ferrous sulfate 325 (65 FE) MG tablet, Take 325 mg by mouth every morning. , Disp: , Rfl:  .  furosemide (LASIX) 20 MG tablet, Take 1 tablet (20 mg total) by mouth daily., Disp: 90 tablet, Rfl: 3 .  lisinopril (ZESTRIL) 5 MG tablet, Take 1 tablet (5 mg total) by mouth daily., Disp: 30 tablet, Rfl: 11 .  methimazole (TAPAZOLE) 10 MG tablet, Take 1 tablet (10 mg total) by mouth 2 (two) times daily., Disp: 60 tablet, Rfl: 6 .  metoprolol tartrate (LOPRESSOR) 25 MG tablet, Take 1 tablet (25 mg total) by mouth 2 (two) times daily., Disp: 60 tablet, Rfl: 11 .  nitroGLYCERIN (NITROSTAT) 0.4 MG SL tablet, Place 1 tablet (0.4 mg total) under the tongue every 5 (five) minutes as needed for chest pain., Disp: 100 tablet, Rfl: 3 .  potassium chloride SA (K-DUR) 20 MEQ tablet, Take 1 tablet (20 mEq total) by mouth daily., Disp: 90 tablet, Rfl: 3 .  ticagrelor (BRILINTA) 90 MG TABS tablet, Take 1 tablet (90 mg total) by mouth 2 (two) times daily., Disp: 60 tablet, Rfl: 11  Past Medical History: Past Medical History:  Diagnosis Date  . Acute  encephalopathy   . Anemia   . CAD (coronary artery disease)   . HFrEF (heart failure with reduced ejection fraction) ([place])   . HTN (hypertension)   . Hyperthyroidism   . 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 Thyroid storm     Tobacco Use: Social History   Tobacco Use  Smoking Status Never Smoker  Smokeless Tobacco Never Used    Labs: Recent Review Flowsheet Data    Labs for ITP Cardiac and Pulmonary Rehab Latest Ref Rng & Units 05/21/2019 05/21/2019 05/21/2019 05/22/2019 05/22/2019   Cholestrol 0 - 200 mg/dL - - - - -   LDLCALC 0 - 99 mg/dL - - - - -   HDL >39.00 mg/dL - - - - -   Trlycerides <150 mg/dL - - 69 - -   Hemoglobin A1c 4.6 - 6.5 % - - - - -   PHART 7.350 - 7.450 7.290(L) 7.288(L) - 7.484(H) -   PCO2ART 32.0 - 48.0 mmHg 43.2 39.3 - 34.5 -   HCO3 20.0 - 28.0 mmol/L 20.7 18.8(L) - 26.0 22.5   TCO2 22 - 32 mmol/L 22 20(L) - 27 24   ACIDBASEDEF 0.0 - 2.0 mmol/L 6.0(H) 7.0(H) - - 2.0   O2SAT % 67.0 90.0 - 100.0 79.0       Exercise Target Goals: Exercise Program Goal: Individual exercise prescription set using results  from initial 6 min walk test and THRR while considering  patient's activity barriers and safety.   Exercise Prescription Goal: Initial exercise prescription builds to 30-45 minutes a day of aerobic activity, 2-3 days per week.  Home exercise guidelines will be given to patient during program as part of exercise prescription that the participant will acknowledge.  Activity Barriers & Risk Stratification: Activity Barriers & Cardiac Risk Stratification - 06/14/19 1218      Activity Barriers & Cardiac Risk Stratification   Activity Barriers  None    Cardiac Risk Stratification  Moderate       6 Minute Walk: 6 Minute Walk    Row Name 06/17/19 1413         6 Minute Walk   Phase  Initial     Distance  900 feet     Walk Time  6 minutes     # of Rest Breaks  0     MPH  1.7     METS  2.88     RPE  9     Perceived Dyspnea   0     VO2 Peak  10.08      Symptoms  No     Resting HR  61 bpm     Resting BP  132/80     Exercise Oxygen Saturation  during 6 min walk  98 %     Max Ex. HR  84 bpm     Max Ex. BP  126/64     2 Minute Post BP  110/66        Oxygen Initial Assessment:   Oxygen Re-Evaluation:   Oxygen Discharge (Final Oxygen Re-Evaluation):   Initial Exercise Prescription: Initial Exercise Prescription - 06/17/19 1400      Date of Initial Exercise RX and Referring Provider   Date  06/17/19    Referring Provider  Arida      Arm Ergometer   Level  1    Watts  43    RPM  25    Minutes  15    METs  1.7      T5 Nustep   Level  1    SPM  80    Minutes  15    METs  1.7      Biostep-RELP   Level  2    SPM  50    Minutes  15    METs  2      Prescription Details   Frequency (times per week)  3    Duration  Progress to 30 minutes of continuous aerobic without signs/symptoms of physical distress      Intensity   THRR 40-80% of Max Heartrate  105-149    Ratings of Perceived Exertion  11-13    Perceived Dyspnea  0-4      Resistance Training   Training Prescription  Yes    Weight  3 lb    Reps  10-15       Perform Capillary Blood Glucose checks as needed.  Exercise Prescription Changes: Exercise Prescription Changes    Row Name 06/17/19 1400             Response to Exercise   Blood Pressure (Admit)  132/80       Blood Pressure (Exercise)  126/64       Blood Pressure (Exit)  110/66       Heart Rate (Admit)  61 bpm       Heart Rate (Exercise)  84 bpm       Heart Rate (Exit)  58 bpm       Oxygen Saturation (Exercise)  98 %       Rating of Perceived Exertion (Exercise)  9       Perceived Dyspnea (Exercise)  0       Symptoms  none          Exercise Comments:   Exercise Goals and Review: Exercise Goals    Row Name 06/17/19 1426             Exercise Goals   Increase Physical Activity  Yes       Intervention  Provide advice, education, support and counseling about physical  activity/exercise needs.;Develop an individualized exercise prescription for aerobic and resistive training based on initial evaluation findings, risk stratification, comorbidities and participant's personal goals.       Expected Outcomes  Short Term: Attend rehab on a regular basis to increase amount of physical activity.;Long Term: Add in home exercise to make exercise part of routine and to increase amount of physical activity.;Long Term: Exercising regularly at least 3-5 days a week.       Increase Strength and Stamina  Yes       Intervention  Provide advice, education, support and counseling about physical activity/exercise needs.;Develop an individualized exercise prescription for aerobic and resistive training based on initial evaluation findings, risk stratification, comorbidities and participant's personal goals.       Expected Outcomes  Short Term: Increase workloads from initial exercise prescription for resistance, speed, and METs.;Short Term: Perform resistance training exercises routinely during rehab and add in resistance training at home;Long Term: Improve cardiorespiratory fitness, muscular endurance and strength as measured by increased METs and functional capacity (6MWT)       Able to understand and use rate of perceived exertion (RPE) scale  Yes       Intervention  Provide education and explanation on how to use RPE scale       Expected Outcomes  Short Term: Able to use RPE daily in rehab to express subjective intensity level;Long Term:  Able to use RPE to guide intensity level when exercising independently       Able to understand and use Dyspnea scale  Yes       Intervention  Provide education and explanation on how to use Dyspnea scale       Expected Outcomes  Short Term: Able to use Dyspnea scale daily in rehab to express subjective sense of shortness of breath during exertion;Long Term: Able to use Dyspnea scale to guide intensity level when exercising independently        Knowledge and understanding of Target Heart Rate Range (THRR)  Yes       Intervention  Provide education and explanation of THRR including how the numbers were predicted and where they are located for reference       Expected Outcomes  Short Term: Able to state/look up THRR;Short Term: Able to use daily as guideline for intensity in rehab;Long Term: Able to use THRR to govern intensity when exercising independently       Able to check pulse independently  Yes       Intervention  Provide education and demonstration on how to check pulse in carotid and radial arteries.;Review the importance of being able to check your own pulse for safety during independent exercise       Expected Outcomes  Short Term: Able to explain why pulse checking is  important during independent exercise;Long Term: Able to check pulse independently and accurately       Understanding of Exercise Prescription  Yes       Intervention  Provide education, explanation, and written materials on patient's individual exercise prescription       Expected Outcomes  Short Term: Able to explain program exercise prescription;Long Term: Able to explain home exercise prescription to exercise independently          Exercise Goals Re-Evaluation :   Discharge Exercise Prescription (Final Exercise Prescription Changes): Exercise Prescription Changes - 06/17/19 1400      Response to Exercise   Blood Pressure (Admit)  132/80    Blood Pressure (Exercise)  126/64    Blood Pressure (Exit)  110/66    Heart Rate (Admit)  61 bpm    Heart Rate (Exercise)  84 bpm    Heart Rate (Exit)  58 bpm    Oxygen Saturation (Exercise)  98 %    Rating of Perceived Exertion (Exercise)  9    Perceived Dyspnea (Exercise)  0    Symptoms  none       Nutrition:  Target Goals: Understanding of nutrition guidelines, daily intake of sodium '1500mg'$ , cholesterol '200mg'$ , calories 30% from fat and 7% or less from saturated fats, daily to have 5 or more servings of  fruits and vegetables.  Biometrics: Pre Biometrics - 06/17/19 1415      Pre Biometrics   Height  '5\' 4"'$  (1.626 m)    Weight  188 lb 14.4 oz (85.7 kg)    BMI (Calculated)  32.41    Single Leg Stand  26.79 seconds        Nutrition Therapy Plan and Nutrition Goals:   Nutrition Assessments:   Nutrition Goals Re-Evaluation:   Nutrition Goals Discharge (Final Nutrition Goals Re-Evaluation):   Psychosocial: Target Goals: Acknowledge presence or absence of significant depression and/or stress, maximize coping skills, provide positive support system. Participant is able to verbalize types and ability to use techniques and skills needed for reducing stress and depression.   Initial Review & Psychosocial Screening: Initial Psych Review & Screening - 06/14/19 1216      Initial Review   Current issues with  Current Stress Concerns    Source of Stress Concerns  Family;Occupation    [place] lives with her son. 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 She states they are working well with her. Her sister is still in her home country of [place] she is questioning returning there sometime in the future.      Family Dynamics   Good Support System?  Yes      Barriers   Psychosocial barriers to participate in program  There are no identifiable barriers or psychosocial needs.;The patient should benefit from training in stress management and relaxation.      Screening Interventions   Interventions  Encouraged to exercise;To provide support and resources with identified psychosocial needs;Provide feedback about the scores to participant    Expected Outcomes  Short Term goal: Utilizing psychosocial counselor, staff and physician to assist with identification of specific Stressors or current issues interfering with healing process. Setting desired goal for each stressor or current issue identified.;Long Term Goal: Stressors or  current issues are controlled or eliminated.;Short Term goal: Identification and review with participant of any Quality of Life or Depression concerns found by scoring the questionnaire.;Long Term goal: The participant improves  quality of Life and PHQ9 Scores as seen by post scores and/or verbalization of changes       Quality of Life Scores:  Quality of Life - 06/17/19 1440      Quality of Life   Select  Quality of Life      Quality of Life Scores   Health/Function Pre  14.17 %    Socioeconomic Pre  22.13 %    Psych/Spiritual Pre  20.57 %    Family Pre  30 %    GLOBAL Pre  19.37 %      Scores of 19 and below usually indicate a poorer quality of life in these areas.  A difference of  2-3 points is a clinically meaningful difference.  A difference of 2-3 points in the total score of the Quality of Life Index has been associated with significant improvement in overall quality of life, self-image, physical symptoms, and general health in studies assessing change in quality of life.  PHQ-9: Recent Review Flowsheet Data    Depression screen [facility] 2/9 06/17/2019 12/04/2015   Decreased Interest 0 3   Down, Depressed, Hopeless 0 0   PHQ - 2 Score 0 3   Altered sleeping 0 1   Tired, decreased energy 0 3   Change in appetite 0 1   Feeling bad or failure about yourself  0 3   Trouble concentrating 0 0   Moving slowly or fidgety/restless 0 1   Suicidal thoughts 0 0   PHQ-9 Score 0 12   Difficult doing work/chores Not difficult at all Not difficult at all     Interpretation of Total Score  Total Score Depression Severity:  1-4 = Minimal depression, 5-9 = Mild depression, 10-14 = Moderate depression, 15-19 = Moderately severe depression, 20-27 = Severe depression   Psychosocial Evaluation and Intervention:   Psychosocial Re-Evaluation:   Psychosocial Discharge (Final Psychosocial Re-Evaluation):   Vocational Rehabilitation: Provide vocational rehab assistance to qualifying  candidates.   Vocational Rehab Evaluation & Intervention: Vocational Rehab - 06/14/19 1216      Initial Vocational Rehab Evaluation & Intervention   Assessment shows need for Vocational Rehabilitation  No       Education: Education Goals: Education classes will be provided on a variety of topics geared toward better understanding of heart health and risk factor modification. Participant will state understanding/return demonstration of topics presented as noted by education test scores.  Learning Barriers/Preferences: Learning Barriers/Preferences - 06/14/19 1216      Learning Barriers/Preferences   Learning Barriers  None    Learning Preferences  None       Education Topics:  AED/CPR: - Group verbal and written instruction with the use of models to demonstrate the basic use of the AED with the basic ABC's of resuscitation.   General Nutrition Guidelines/Fats and Fiber: -Group instruction provided by verbal, written material, models and posters to present the general guidelines for heart healthy nutrition. Gives an explanation and review of dietary fats and fiber.   Controlling Sodium/Reading Food Labels: -Group verbal and written material supporting the discussion of sodium use in heart healthy nutrition. Review and explanation with models, verbal and written materials for utilization of the food label.   Exercise Physiology & General Exercise Guidelines: - Group verbal and written instruction with models to review the exercise physiology of the cardiovascular system and associated critical values. Provides general exercise guidelines with specific guidelines to those with heart or lung disease.    Aerobic Exercise & Resistance  Training: - Gives group verbal and written instruction on the various components of exercise. Focuses on aerobic and resistive training programs and the benefits of this training and how to safely progress through these programs..   Flexibility,  Balance, Mind/Body Relaxation: Provides group verbal/written instruction on the benefits of flexibility and balance training, including mind/body exercise modes such as yoga, pilates and tai chi.  Demonstration and skill practice provided.   Stress and Anxiety: - Provides group verbal and written instruction about the health risks of elevated stress and causes of high stress.  Discuss the correlation between heart/lung disease and anxiety and treatment options. Review healthy ways to manage with stress and anxiety.   Depression: - Provides group verbal and written instruction on the correlation between heart/lung disease and depressed mood, treatment options, and the stigmas associated with seeking treatment.   Anatomy & Physiology of the Heart: - Group verbal and written instruction and models provide basic cardiac anatomy and physiology, with the coronary electrical and arterial systems. Review of Valvular disease and Heart Failure   Cardiac Procedures: - Group verbal and written instruction to review commonly prescribed medications for heart disease. Reviews the medication, class of the drug, and side effects. Includes the steps to properly store meds and maintain the prescription regimen. (beta blockers and nitrates)   Cardiac Medications I: - Group verbal and written instruction to review commonly prescribed medications for heart disease. Reviews the medication, class of the drug, and side effects. Includes the steps to properly store meds and maintain the prescription regimen.   Cardiac Medications II: -Group verbal and written instruction to review commonly prescribed medications for heart disease. Reviews the medication, class of the drug, and side effects. (all other drug classes)    Go Sex-Intimacy & Heart Disease, Get SMART - Goal Setting: - Group verbal and written instruction through game format to discuss heart disease and the return to sexual intimacy. Provides group  verbal and written material to discuss and apply goal setting through the application of the S.M.A.R.T. Method.   Other Matters of the Heart: - Provides group verbal, written materials and models to describe Stable Angina and Peripheral Artery. Includes description of the disease process and treatment options available to the cardiac patient.   Exercise & Equipment Safety: - Individual verbal instruction and demonstration of equipment use and safety with use of the equipment.   Cardiac Rehab from 06/17/2019 in [facility] Cardiac and Pulmonary Rehab  Date  06/17/19  Educator  AS  Instruction Review Code  1- Verbalizes Understanding      Infection Prevention: - Provides verbal and written material to individual with discussion of infection control including proper hand washing and proper equipment cleaning during exercise session.   Cardiac Rehab from 06/17/2019 in [facility] Cardiac and Pulmonary Rehab  Date  06/17/19  Educator  AS  Instruction Review Code  1- Verbalizes Understanding      Falls Prevention: - Provides verbal and written material to individual with discussion of falls prevention and safety.   Cardiac Rehab from 06/17/2019 in [facility] Cardiac and Pulmonary Rehab  Date  06/17/19  Educator  AS  Instruction Review Code  1- Verbalizes Understanding      Diabetes: - Individual verbal and written instruction to review signs/symptoms of diabetes, desired ranges of glucose level fasting, after meals and with exercise. Acknowledge that pre and post exercise glucose checks will be done for 3 sessions at entry of program.   Know Your Numbers and Risk Factors: -Group verbal  and written instruction about important numbers in your health.  Discussion of what are risk factors and how they play a role in the disease process.  Review of Cholesterol, Blood Pressure, Diabetes, and BMI and the role they play in your overall health.   Sleep Hygiene: -Provides group verbal and written instruction  about how sleep can affect your health.  Define sleep hygiene, discuss sleep cycles and impact of sleep habits. Review good sleep hygiene tips.    Other: -Provides group and verbal instruction on various topics (see comments)   Knowledge Questionnaire Score:   Core Components/Risk Factors/Patient Goals at Admission: Personal Goals and Risk Factors at Admission - 06/17/19 1414      Core Components/Risk Factors/Patient Goals on Admission    Weight Management  Yes;Obesity;Weight Loss    Intervention  Weight Management/Obesity: Establish reasonable short term and long term weight goals.    Admit Weight  188 lb 14.4 oz (85.7 kg)    Goal Weight: Short Term  178 lb (80.7 kg)    Goal Weight: Long Term  168 lb (76.2 kg)    Expected Outcomes  Weight Maintenance: Understanding of the daily nutrition guidelines, which includes 25-35% calories from fat, 7% or less cal from saturated fats, less than '200mg'$  cholesterol, less than 1.5gm of sodium, & 5 or more servings of fruits and vegetables daily;Short Term: Continue to assess and modify interventions until short term weight is achieved;Long Term: Adherence to nutrition and physical activity/exercise program aimed toward attainment of established weight goal    Intervention  Provide a combined exercise and nutrition program that is supplemented with education, support and counseling about heart failure. Directed toward relieving symptoms such as shortness of breath, decreased exercise tolerance, and extremity edema.    Expected Outcomes  Improve functional capacity of life;Short term: Attendance in program 2-3 days a week with increased exercise capacity. Reported lower sodium intake. Reported increased fruit and vegetable intake. Reports medication compliance.;Short term: Daily weights obtained and reported for increase. Utilizing diuretic protocols set by physician.;Long term: Adoption of self-care skills and reduction of barriers for early signs and  symptoms recognition and intervention leading to self-care maintenance.    Intervention  Provide education on lifestyle modifcations including regular physical activity/exercise, weight management, moderate sodium restriction and increased consumption of fresh fruit, vegetables, and low fat dairy, alcohol moderation, and smoking cessation.;Monitor prescription use compliance.    Expected Outcomes  Short Term: Continued assessment and intervention until BP is < 140/73m HG in hypertensive participants. < 130/891mHG in hypertensive participants with diabetes, heart failure or chronic kidney disease.;Long Term: Maintenance of blood pressure at goal levels.    Intervention  Provide education and support for participant on nutrition & aerobic/resistive exercise along with prescribed medications to achieve LDL '70mg'$ , HDL >'40mg'$ .    Expected Outcomes  Short Term: Participant states understanding of desired cholesterol values and is compliant with medications prescribed. Participant is following exercise prescription and nutrition guidelines.;Long Term: Cholesterol controlled with medications as prescribed, with individualized exercise RX and with personalized nutrition plan. Value goals: LDL < '70mg'$ , HDL > 40 mg.       Core Components/Risk Factors/Patient Goals Review:    Core Components/Risk Factors/Patient Goals at Discharge (Final Review):    ITP Comments: ITP Comments    Row Name 06/14/19 1213           ITP Comments  Virtual Initial Orientation completed. Diagnosis can be found in [facility] 8/3. EP/RD orientation scheduled for 8/31 at 1pm  Comments: initial ITP

## 2019-06-17 NOTE — Patient Instructions (Addendum)
Patient Instructions  Patient Details  Name: Amber [name] MRN: [id number] Date of Birth: Nov 01, 1970 Referring Provider:  Wellington Hampshire, MD  Below are your personal goals for exercise, nutrition, and risk factors. Our goal is to help you stay on track towards obtaining and maintaining these goals. We will be discussing your progress on these goals with you throughout the program.  Initial Exercise Prescription: Initial Exercise Prescription - 06/17/19 1400      Date of Initial Exercise RX and Referring Provider   Date  06/17/19    Referring Provider  Arida      Treadmill   MPH  1.8    Grade  0.5    Minutes  15    METs  2.5      Arm Ergometer   Level  1    Watts  43    RPM  25    Minutes  15    METs  2.5      T5 Nustep   Level  1    SPM  80    Minutes  15    METs  2.5      Biostep-RELP   Level  2    SPM  50    Minutes  15    METs  2      Prescription Details   Frequency (times per week)  3    Duration  Progress to 30 minutes of continuous aerobic without signs/symptoms of physical distress      Intensity   THRR 40-80% of Max Heartrate  105-149    Ratings of Perceived Exertion  11-13    Perceived Dyspnea  0-4      Resistance Training   Training Prescription  Yes    Weight  3 lb    Reps  10-15       Exercise Goals: Frequency: Be able to perform aerobic exercise two to three times per week in program working toward 2-5 days per week of home exercise.  Intensity: Work with a perceived exertion of 11 (fairly light) - 15 (hard) while following your exercise prescription.  We will make changes to your prescription with you as you progress through the program.   Duration: Be able to do 30 to 45 minutes of continuous aerobic exercise in addition to a 5 minute warm-up and a 5 minute cool-down routine.   Nutrition Goals: Your personal nutrition goals will be established when you do your nutrition analysis with the dietician.  The following are general  nutrition guidelines to follow: Cholesterol < 200mg /day Sodium < 1500mg /day Fiber: Women under 50 yrs - 25 grams per day  Personal Goals: Personal Goals and Risk Factors at Admission - 06/17/19 1414      Core Components/Risk Factors/Patient Goals on Admission    Weight Management  Yes;Obesity;Weight Loss    Intervention  Weight Management/Obesity: Establish reasonable short term and long term weight goals.    Admit Weight  188 lb 14.4 oz (85.7 kg)    Goal Weight: Short Term  178 lb (80.7 kg)    Goal Weight: Long Term  168 lb (76.2 kg)    Expected Outcomes  Weight Maintenance: Understanding of the daily nutrition guidelines, which includes 25-35% calories from fat, 7% or less cal from saturated fats, less than 200mg  cholesterol, less than 1.5gm of sodium, & 5 or more servings of fruits and vegetables daily;Short Term: Continue to assess and modify interventions until short term weight is achieved;Long Term: Adherence to nutrition and  physical activity/exercise program aimed toward attainment of established weight goal    Intervention  Provide a combined exercise and nutrition program that is supplemented with education, support and counseling about heart failure. Directed toward relieving symptoms such as shortness of breath, decreased exercise tolerance, and extremity edema.    Expected Outcomes  Improve functional capacity of life;Short term: Attendance in program 2-3 days a week with increased exercise capacity. Reported lower sodium intake. Reported increased fruit and vegetable intake. Reports medication compliance.;Short term: Daily weights obtained and reported for increase. Utilizing diuretic protocols set by physician.;Long term: Adoption of self-care skills and reduction of barriers for early signs and symptoms recognition and intervention leading to self-care maintenance.    Intervention  Provide education on lifestyle modifcations including regular physical activity/exercise, weight  management, moderate sodium restriction and increased consumption of fresh fruit, vegetables, and low fat dairy, alcohol moderation, and smoking cessation.;Monitor prescription use compliance.    Expected Outcomes  Short Term: Continued assessment and intervention until BP is < 140/90mm HG in hypertensive participants. < 130/80mm HG in hypertensive participants with diabetes, heart failure or chronic kidney disease.;Long Term: Maintenance of blood pressure at goal levels.    Intervention  Provide education and support for participant on nutrition & aerobic/resistive exercise along with prescribed medications to achieve LDL 70mg , HDL >40mg .    Expected Outcomes  Short Term: Participant states understanding of desired cholesterol values and is compliant with medications prescribed. Participant is following exercise prescription and nutrition guidelines.;Long Term: Cholesterol controlled with medications as prescribed, with individualized exercise RX and with personalized nutrition plan. Value goals: LDL < 70mg , HDL > 40 mg.       Tobacco Use Initial Evaluation: Social History   Tobacco Use  Smoking Status Never Smoker  Smokeless Tobacco Never Used    Exercise Goals and Review: Exercise Goals    Row Name 06/17/19 1426             Exercise Goals   Increase Physical Activity  Yes       Intervention  Provide advice, education, support and counseling about physical activity/exercise needs.;Develop an individualized exercise prescription for aerobic and resistive training based on initial evaluation findings, risk stratification, comorbidities and participant's personal goals.       Expected Outcomes  Short Term: Attend rehab on a regular basis to increase amount of physical activity.;Long Term: Add in home exercise to make exercise part of routine and to increase amount of physical activity.;Long Term: Exercising regularly at least 3-5 days a week.       Increase Strength and Stamina  Yes        Intervention  Provide advice, education, support and counseling about physical activity/exercise needs.;Develop an individualized exercise prescription for aerobic and resistive training based on initial evaluation findings, risk stratification, comorbidities and participant's personal goals.       Expected Outcomes  Short Term: Increase workloads from initial exercise prescription for resistance, speed, and METs.;Short Term: Perform resistance training exercises routinely during rehab and add in resistance training at home;Long Term: Improve cardiorespiratory fitness, muscular endurance and strength as measured by increased METs and functional capacity (<MEASUREME<MEASUREMENT<MEASUREMENT<MEASUREMENT<MEASUREMENT<MEASUREMENT<MEASUREMENT<MEASUREMENT<MEASUREMENT T>)       Able to understand and use rate of perceived exertion (RPE) scale  Yes       Intervention  Provide education and explanation on how to use RPE scale       Expected Outcomes  Short Term: Able to use RPE daily in rehab to express subjective intensity level;Long Term:  Able to use RPE to guide intensity level when exercising independently       Able to understand and use Dyspnea scale  Yes       Intervention  Provide education and explanation on how to use Dyspnea scale       Expected Outcomes  Short Term: Able to use Dyspnea scale daily in rehab to express subjective sense of shortness of breath during exertion;Long Term: Able to use Dyspnea scale to guide intensity level when exercising independently       Knowledge and understanding of Target Heart Rate Range (THRR)  Yes       Intervention  Provide education and explanation of THRR including how the numbers were predicted and where they are located for reference       Expected Outcomes  Short Term: Able to state/look up THRR;Short Term: Able to use daily as guideline for intensity in rehab;Long Term: Able to use THRR to govern intensity when exercising independently       Able to check pulse independently  Yes       Intervention  Provide education and demonstration on how to check pulse in  carotid and radial arteries.;Review the importance of being able to check your own pulse for safety during independent exercise       Expected Outcomes  Short Term: Able to explain why pulse checking is important during independent exercise;Long Term: Able to check pulse independently and accurately       Understanding of Exercise Prescription  Yes       Intervention  Provide education, explanation, and written materials on patient's individual exercise prescription       Expected Outcomes  Short Term: Able to explain program exercise prescription;Long Term: Able to explain home exercise prescription to exercise independently          Copy of goals given to participant.

## 2019-06-17 NOTE — Progress Notes (Signed)
Cardiology Office Note    Date:  06/21/2019   ID:  Amber [name], DOB 02/15/1971, MRN [id number]  PCP:  Doreene Nestlark, Katherine K, NP  Cardiologist:  Lorine BearsMuhammad Arida, MD  Electrophysiologist:  None   Chief Complaint: Follow up  History of Present Illness:   Amber Ravelingntonia Stfleur is a 48 y.o. 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   Patient was seen by PCP in 03/2019 with complaints of unexplained weight loss, palpitations, anxiety, insomnia, and shortness of breath with TSH being found to be suppressed at less than 0.01 with an elevated free T4 of 5.96.  In this setting she was started on methimazole.  She was admitted to [facility] in 05/2019, with acute hypoxic respiratory failure requiring mechanical ventilation in the setting of possible atypical pneumonia, non-STEMI, pulmonary edema, and hyperthyroidism with thyroid storm in the setting of medication noncompliance.  EKG showed sinus tachycardia, 130s bpm, LVH, ST depression consistent with repolarization abnormalities.  D-dimer was less than 0.27.  High-sensitivity troponin was 57 with a delta troponin of 162, and peaking at 9688.  WBC was 19.8.  Chest CT revealed patchy groundglass infiltrates bilaterally predominantly within the bases.  COVID-19 test was negative.  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  Patient's cath had to be delayed until 8/5 in the setting of fever of 102.6 on 8/4.  Patient underwent right and left cardiac cath on 05/22/2019 which showed significant  two-vessel CAD with 80% mid LAD stenosis, occluded OM branches and moderate RCA disease as outlined below.  Right heart catheterization showed high normal filling pressures with a mean RA pressure of 8 mmHg, pulmonary capillary wedge pressure of 12 to 13 mmHg, mild pulmonary hypertension and high cardiac output at 8 L/min.  The patient underwent successful PCI/DES to the mid LAD.  Documented discharge weight of 80.7 kg.  Following her discharge, she was seen in the ED on 05/27/2019 with large-volume watery diarrhea following recent antibiotics for potential pneumonia during her admission above.  C. difficile and GI panel were negative.  Patient was seen by PCP on 05/30/2019 and was feeling well.  Her weight remained stable around 182 to 183 pounds.  She had not taken any Lasix.  She was seen by cardiology on 06/06/2019 and was doing well with some slow weight increase from 182 to 187.4 pounds with pedal edema.  She was compliant with diet and medications.  Her BP was running in the 130s to 140s systolic.  She was started on Lasix 20 mg daily with follow up labs showing stable renal function as below.   She comes in doing well from a cardiac perspective.  She denies any chest pain, lower extremity swelling, palpitations, dizziness, presyncope, or syncope.  She does note some shortness of breath if she talks very fast or for extended timeframes.  She denies any abdominal distention, PND, or early satiety.  She has stable 2-3 pillow orthopnea.  She is compliant with all medications including dual antiplatelet therapy.  No falls, BRBPR, or melena.  She is not adding salt to food and not eating out at  restaurants.  She is drinking less than 2 L of fluids daily.  Her main concern today is her weight continues to trend up on a gradual basis with a current weight of 190 pounds.  She wonders if this is in the setting of treatment of her hyperthyroidism.   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   Thyroid storm     Past Surgical History:  Procedure Laterality Date   CESAREAN SECTION     x2   CORONARY STENT INTERVENTION N/A 05/22/2019   Procedure: CORONARY STENT INTERVENTION;  Surgeon: Iran OuchArida, Muhammad A, MD;  Location: [facility] INVASIVE CV LAB;  Service: Cardiovascular;  Laterality: N/A;   MASS EXCISION Right 01/18/2013   Procedure: EXCISION right scapular cyst;  Surgeon: Axel FillerArmando Ramirez, MD;  Location: [facility] ORS;  Service: General;  Laterality: Right;   RIGHT/LEFT HEART CATH AND CORONARY ANGIOGRAPHY N/A 05/22/2019   Procedure: RIGHT/LEFT HEART CATH AND CORONARY ANGIOGRAPHY;  Surgeon: Iran OuchArida, Muhammad A, MD;  Location: [facility] INVASIVE CV LAB;  Service: Cardiovascular;  Laterality: N/A;    Current Medications: Current Meds  Medication Sig   aspirin 81 MG chewable tablet Chew 1 tablet (81 mg total) by mouth daily.   atorvastatin (LIPITOR) 80 MG tablet Take 1 tablet (80 mg total) by mouth daily at 6 PM.   calcium-vitamin D 250-100 MG-UNIT tablet Take 1 tablet by mouth 2 (two) times daily.   ferrous sulfate 325 (65 FE) MG tablet Take 325 mg by mouth every morning.    furosemide (LASIX) 20 MG tablet Take 1 tablet (20 mg total) by mouth daily.   lisinopril (ZESTRIL) 5 MG tablet Take 1 tablet (5 mg total) by mouth daily.   methimazole (TAPAZOLE) 10 MG tablet Take 1 tablet (10 mg total) by mouth 2 (two) times daily.   metoprolol tartrate (LOPRESSOR) 25 MG tablet Take 1 tablet (25 mg total) by mouth 2 (two) times daily.   nitroGLYCERIN (NITROSTAT) 0.4 MG SL tablet Place  1 tablet (0.4 mg total) under the tongue every 5 (five) minutes as needed for chest pain.   potassium chloride SA (K-DUR) 20 MEQ tablet Take 1 tablet (20 mEq total) by mouth daily.   ticagrelor (BRILINTA) 90 MG TABS tablet Take 1 tablet (90 mg total) by mouth 2 (two) times daily.     Allergies:   Patient has no known allergies.   Social History   Socioeconomic History   Marital status: Married    Spouse name: Not on file   Number of children: 2   Years of education: Not on file   Highest education level: Not on file  Occupational History    Comment: front desk   Social Needs   Financial resource strain: Not on file   Food insecurity    Worry: Not on file    Inability: Not on file   Transportation needs    Medical: Not on file    Non-medical: Not on file  Tobacco Use   Smoking status: Never Smoker  Smokeless tobacco: Never Used  Substance and Sexual Activity   Alcohol use: Yes    Alcohol/week: 0.0 standard drinks    Comment: rare   Drug use: No   Sexual activity: Not on file  Lifestyle   Physical activity    Days per week: Not on file    Minutes per session: Not on file   Stress: Not on file  Relationships   Social connections    Talks on phone: Not on file    Gets together: Not on file    Attends religious service: Not on file    Active member of club or organization: Not on file    Attends meetings of clubs or organizations: Not on file    Relationship status: Not on file  Other Topics Concern   Not on file  Social History Narrative   From [place].   Married.   2 children.   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     Family History:  The patient's family history includes Diabetes in her father, mother, and sister; Heart disease in her mother; Hypertension in her sister.  ROS:   Review of Systems  Constitutional: Positive for malaise/fatigue. Negative for chills, diaphoresis,  fever and weight loss.       Slow weight gain  HENT: Negative for congestion.   Eyes: Negative for discharge and redness.  Respiratory: Positive for shortness of breath. Negative for cough, hemoptysis, sputum production and wheezing.   Cardiovascular: Negative for chest pain, palpitations, orthopnea, claudication, leg swelling and PND.  Gastrointestinal: Negative for abdominal pain, blood in stool, heartburn, melena, nausea and vomiting.  Genitourinary: Negative for hematuria.  Musculoskeletal: Negative for falls and myalgias.  Skin: Negative for rash.  Neurological: Negative for dizziness, tingling, tremors, sensory change, speech change, focal weakness, loss of consciousness and weakness.  Endo/Heme/Allergies: Does not bruise/bleed easily.  Psychiatric/Behavioral: Negative for substance abuse. The patient is not nervous/anxious.   All other systems reviewed and are negative.    EKGs/Labs/Other Studies Reviewed:    Studies reviewed were summarized above. The additional studies were reviewed today:  R/LHC 05/22/2019:  1st Mrg lesion is 100% stenosed.  2nd Mrg lesion is 100% stenosed.  Mid LAD lesion is 80% stenosed.  Post intervention, there is a 0% residual stenosis.  A drug-eluting stent was successfully placed using a STENT RESOLUTE ONYX 3.0X18.  2nd Diag lesion is 30% stenosed.  Prox RCA to Mid RCA lesion is 50% stenosed.  RPAV lesion is 40% stenosed.  RPDA lesion is 30% stenosed.  1. Significant two-vessel coronary artery disease with 80% mid LAD stenosis, occluded OM branches and moderate RCA disease. 2. Left ventricular angiography was not performed. EF was severely reduced by echo. 3. Right heart catheterization showed high normal filling pressures with mean RA pressure of 8 mmHg, pulmonary capillary wedge pressure of 12 to 13 mmHg, mild pulmonary hypertension and high cardiac output at 8 L/min. 4. Successful angioplasty and drug-eluting stent placement to the  mid LAD.  Recommendations: Continue dual antiplatelet therapy for at least 1 year. The patient needs aggressive medical therapy for the rest of her coronary artery disease. I added high-dose atorvastatin. I added oral metoprolol to control tachycardia. I discontinued IV furosemide for now and she might require resumption of this either intravenously or orally as needed. The patient was alert throughout the procedure . Her hemodynamics are good enough to allow extubation if no other issues. __________  2D Echo 05/21/2019: 1. The left ventricle has severely reduced systolic function, with an ejection fraction of 25-30%. The cavity size was moderately dilated. Left ventricular diastolic parameters were normal. 2. 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 3. The right ventricle has normal systolic function. The cavity was normal. There is no increase in right ventricular wall thickness. 4. The aortic valve is tricuspid. Mild thickening of the aortic valve. 5. The aorta is normal in size and structure.   EKG:  EKG is ordered today.  The EKG ordered today demonstrates NSR, 62 bpm, LVH, prior inferior infarct, nonspecific ST-T changes improving from prior  Recent Labs: 05/20/2019: ALT 15; B Natriuretic Peptide 137.5 05/24/2019: Magnesium 2.0 05/30/2019: Hemoglobin 10.1; Platelets 337.0 06/04/2019: TSH <0.01 Repeated and verified X2. 06/13/2019: BUN 13; Creatinine, Ser 0.51; Potassium 3.6; Sodium 141  Recent Lipid Panel    Component Value Date/Time   CHOL 132 11/21/2018 0911   TRIG 69 05/21/2019 0512   HDL 32.10 (L) 11/21/2018 0911   CHOLHDL 4 11/21/2018 0911   VLDL 18.2 11/21/2018 0911   LDLCALC 82 11/21/2018 0911    PHYSICAL EXAM:    VS:  BP 130/86 (BP Location: Left Arm, Patient Position: Sitting, Cuff Size: Normal)    Pulse 68    Ht 5\' 4"  (1.626 m)    Wt 190 lb (86.2 kg)    LMP 06/19/2017 (Approximate)    SpO2 98%    BMI 32.61 kg/m   BMI: Body mass index is  32.61 kg/m.  Physical Exam  Constitutional: She is oriented to person, place, and time. She appears well-developed and well-nourished.  HENT:  Head: Normocephalic and atraumatic.  Eyes: Right eye exhibits no discharge. Left eye exhibits no discharge.  Neck: Normal range of motion. No JVD present.  Cardiovascular: Normal rate, regular rhythm, S1 normal, S2 normal and normal heart sounds. Exam reveals no distant heart sounds, no friction rub, no midsystolic click and no opening snap.  No murmur heard. Pulses:      Dorsalis pedis pulses are 2+ on the right side and 2+ on the left side.       Posterior tibial pulses are 2+ on the right side and 2+ on the left side.  Pulmonary/Chest: Effort normal and breath sounds normal. No respiratory distress. She has no decreased breath sounds. She has no wheezes. She has no rales. She exhibits no tenderness.  Abdominal: Soft. She exhibits no distension. There is no abdominal tenderness.  Musculoskeletal:        General: No edema.  Neurological: She is alert and oriented to person, place, and time.  Skin: Skin is warm and dry. No cyanosis. Nails show no clubbing.  Psychiatric: She has a normal mood and affect. Her speech is normal and behavior is normal. Judgment and thought content normal.    REDs vest: 29  Wt Readings from Last 3 Encounters:  06/21/19 190 lb (86.2 kg)  06/17/19 188 lb 14.4 oz (85.7 kg)  06/06/19 187 lb 6.4 oz (85 kg)     ASSESSMENT & PLAN:   1. CAD involving the native coronary arteries: She is doing well with any symptoms concerning for angina.  Continue dual antiplatelet therapy with aspirin and Brilinta without interruption through at least 12 months following date of PCI.  Participating in cardiac rehab.  No plans of further ischemic evaluation at this time.  2. 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  REDs vest  29 today.  Stop lisinopril (last dose this morning)  start Entresto 24/26 mg twice daily on the morning of 06/24/2019 to allow for greater than 36-hour washout of ACE inhibitor.  Transition from Lopressor to Toprol-XL 25 mg daily given her cardiomyopathy.  Continue Lasix 20 mg daily.  Check BMP today and again in 1 week given recent initiation of Entresto as outlined above.  Following BMP next week we will look to add spironolactone as long as renal function and potassium allow to further optimize her evidence-based heart failure therapy.  We will see her back in 2 weeks to continue titration of evidence-based heart failure therapy prior to repeating limited echo in 08/2019 to reevaluate her LV systolic function.  If at that time, despite revascularization and optimization of medical therapy her EF remains less than 35% we will refer her to EP for consideration of ICD.  3. Hyperthyroidism: Followed by endocrinology.  Reports compliance with methimazole.  Cannot exclude treatment of her hyperthyroidism playing a role in her slow weight gain as her hyperthyroidism is being treated.  Less likely volume overload given no lower extremity swelling, no abdominal distention, lungs clear to auscultation bilaterally, no JVD, and normal REDs vest reading in office today.  Follow-up with endocrinology/PCP as directed.  4. Hypertension: Blood pressure is suboptimally controlled today at 130/86.  Medication changes as noted above.  5. Hyperlipidemia: LDL of 82 from 11/2018 with the patient not on statin therapy at that time.  Placed on Lipitor in 05/2019 with a goal LDL less than 70.  Recheck lipid and liver function in 07/2019.   Disposition: F/u with Dr. Fletcher Anon or an APP in 2 weeks.   Medication Adjustments/Labs and Tests Ordered: Current medicines are reviewed at length with the patient today.  Concerns regarding medicines are outlined above. Medication changes, Labs and Tests ordered today are summarized above and listed in the Patient Instructions accessible in  Encounters.   Signed, Christell Faith, PA-C 06/21/2019 10:20 AM     [facility] [address] [place], [place] [address] [phone]

## 2019-06-18 ENCOUNTER — Other Ambulatory Visit: Payer: Self-pay | Admitting: Ophthalmology

## 2019-06-18 DIAGNOSIS — E0500 Thyrotoxicosis with diffuse goiter without thyrotoxic crisis or storm: Secondary | ICD-10-CM

## 2019-06-19 ENCOUNTER — Other Ambulatory Visit: Payer: Self-pay

## 2019-06-19 ENCOUNTER — Encounter: Payer: Commercial Managed Care - PPO | Attending: Cardiovascular Disease | Admitting: *Deleted

## 2019-06-19 DIAGNOSIS — I2510 Atherosclerotic heart disease of native coronary artery without angina pectoris: Secondary | ICD-10-CM | POA: Insufficient documentation

## 2019-06-19 DIAGNOSIS — E0591 Thyrotoxicosis, unspecified with thyrotoxic crisis or storm: Secondary | ICD-10-CM | POA: Diagnosis not present

## 2019-06-19 DIAGNOSIS — Z7901 Long term (current) use of anticoagulants: Secondary | ICD-10-CM | POA: Insufficient documentation

## 2019-06-19 DIAGNOSIS — Z7982 Long term (current) use of aspirin: Secondary | ICD-10-CM | POA: Insufficient documentation

## 2019-06-19 DIAGNOSIS — I5020 Unspecified systolic (congestive) heart failure: Secondary | ICD-10-CM | POA: Insufficient documentation

## 2019-06-19 DIAGNOSIS — I214 Non-ST elevation (NSTEMI) myocardial infarction: Secondary | ICD-10-CM | POA: Diagnosis not present

## 2019-06-19 DIAGNOSIS — Z79899 Other long term (current) drug therapy: Secondary | ICD-10-CM | POA: Insufficient documentation

## 2019-06-19 DIAGNOSIS — E0590 Thyrotoxicosis, unspecified without thyrotoxic crisis or storm: Secondary | ICD-10-CM | POA: Diagnosis not present

## 2019-06-19 DIAGNOSIS — G9340 Encephalopathy, unspecified: Secondary | ICD-10-CM | POA: Insufficient documentation

## 2019-06-19 DIAGNOSIS — Z955 Presence of coronary angioplasty implant and graft: Secondary | ICD-10-CM

## 2019-06-19 NOTE — Progress Notes (Signed)
Daily Session Note  Patient Details  Name: Amber [name] MRN: [id number] Date of Birth: 1971/10/07 Referring Provider:     Cardiac Rehab from 06/17/2019 in [facility]  Referring Provider  Fletcher Anon      Encounter Date: 06/19/2019  Check In: Session Check In - 06/19/19 0950      Check-In   Supervising physician immediately available to respond to emergencies  See telemetry face sheet for immediately available ER MD    Location  [facility]-Cardiac & Pulmonary Rehab    Staff Present  Alberteen Sam, MA, RCEP, CCRP, CCET;Joseph Christain [place], [place] BSN    Virtual Visit  No    Medication changes reported      No    Fall or balance concerns reported     No    Warm-up and Cool-down  Performed on first and last piece of equipment    Resistance Training Performed  Yes    VAD Patient?  No    PAD/SET Patient?  No      Pain Assessment   Currently in Pain?  No/denies          Social History   Tobacco Use  Smoking Status Never Smoker  Smokeless Tobacco Never Used    Goals Met:  Exercise tolerated well Personal goals reviewed No report of cardiac concerns or symptoms Strength training completed today  Goals Unmet:  Not Applicable  Comments: First full day of exercise!  Patient was oriented to gym and equipment including functions, settings, policies, and procedures.  Patient's individual exercise prescription and treatment plan were reviewed.  All starting workloads were established based on the results of the 6 minute walk test done at initial orientation visit.  The plan for exercise progression was also introduced and progression will be customized based on patient's performance and goals.    Dr. Emily Filbert is Medical Director for [facility] and [facility].

## 2019-06-21 ENCOUNTER — Ambulatory Visit (INDEPENDENT_AMBULATORY_CARE_PROVIDER_SITE_OTHER): Payer: Commercial Managed Care - PPO | Admitting: Physician Assistant

## 2019-06-21 ENCOUNTER — Encounter: Payer: Self-pay | Admitting: Physician Assistant

## 2019-06-21 ENCOUNTER — Other Ambulatory Visit: Payer: Self-pay

## 2019-06-21 VITALS — BP 130/86 | HR 68 | Ht 64.0 in | Wt 190.0 lb

## 2019-06-21 DIAGNOSIS — I5022 Chronic systolic (congestive) heart failure: Secondary | ICD-10-CM

## 2019-06-21 DIAGNOSIS — I255 Ischemic cardiomyopathy: Secondary | ICD-10-CM

## 2019-06-21 DIAGNOSIS — I10 Essential (primary) hypertension: Secondary | ICD-10-CM | POA: Diagnosis not present

## 2019-06-21 DIAGNOSIS — I2510 Atherosclerotic heart disease of native coronary artery without angina pectoris: Secondary | ICD-10-CM

## 2019-06-21 DIAGNOSIS — E0590 Thyrotoxicosis, unspecified without thyrotoxic crisis or storm: Secondary | ICD-10-CM | POA: Diagnosis not present

## 2019-06-21 DIAGNOSIS — E785 Hyperlipidemia, unspecified: Secondary | ICD-10-CM

## 2019-06-21 MED ORDER — METOPROLOL SUCCINATE ER 25 MG PO TB24: 25.0000 mg | ORAL_TABLET | Freq: Every day | ORAL | 0 refills | Status: DC

## 2019-06-21 MED ORDER — ENTRESTO 24-26 MG PO TABS: 1.0000 | ORAL_TABLET | Freq: Two times a day (BID) | ORAL | 2 refills | Status: DC

## 2019-06-21 MED ORDER — ATORVASTATIN CALCIUM 80 MG PO TABS: 80.0000 mg | ORAL_TABLET | Freq: Every day | ORAL | 11 refills | Status: DC

## 2019-06-21 NOTE — Patient Instructions (Signed)
Medication Instructions:    Your physician has recommended you make the following change in your medication.  1. STOP Lopressor  2. STOP Lisinopril Today 3. START Troprol 25MG  Take one tablet by mouth daily. 4. START Entresto 24-26MG  Take 1 tablet by mouth twice a day. START ENTRESTO ON Monday 06/24/2019  If you need a refill on your cardiac medications before your next appointment, please call your pharmacy.   Lab work:  Your physician recommends that you return for lab work: 1. Today- BMP 2. In one week 06/28/[facility]. No appointment is needed. You can go anytime between 7AM-6PM   If you have labs (blood work) drawn today and your tests are completely normal, you will receive your results only by: Marland Kitchen MyChart Message (if you have MyChart) OR . A paper copy in the mail If you have any lab test that is abnormal or we need to change your treatment, we will call you to review the results.  Testing/Procedures:  NONE  Follow-Up: At [facility], you and your health needs are our priority.  As part of our continuing mission to provide you with exceptional heart care, we have created designated Provider Care Teams.  These Care Teams include your primary Cardiologist (physician) and Advanced Practice Providers (APPs -  Physician Assistants and Nurse Practitioners) who all work together to provide you with the care you need, when you need it. You will need a follow up appointment in 2 weeks with Christell Faith, PA.

## 2019-06-22 LAB — BASIC METABOLIC PANEL
BUN/Creatinine Ratio: 16 (ref 9–23)
BUN: 9 mg/dL (ref 6–24)
CO2: 21 mmol/L (ref 20–29)
Calcium: 9.4 mg/dL (ref 8.7–10.2)
Chloride: 107 mmol/L — ABNORMAL HIGH (ref 96–106)
Creatinine, Ser: 0.55 mg/dL — ABNORMAL LOW (ref 0.57–1.00)
GFR calc Af Amer: 127 mL/min/{1.73_m2} (ref >59)
GFR calc non Af Amer: 110 mL/min/{1.73_m2} (ref >59)
Glucose: 107 mg/dL — ABNORMAL HIGH (ref 65–99)
Potassium: 4.3 mmol/L (ref 3.5–5.2)
Sodium: 141 mmol/L (ref 134–144)

## 2019-06-25 ENCOUNTER — Telehealth: Payer: Self-pay

## 2019-06-25 NOTE — Telephone Encounter (Signed)
Patient made aware of lab results with verbalized understanding. 

## 2019-06-25 NOTE — Telephone Encounter (Signed)
-----   Message from Rise Mu, PA-C sent at 06/24/2019  9:26 AM EDT ----- Renal function remains stable.  Potassium at goal.  No changes from recommendation at last office visit.  Follow up as planned.

## 2019-06-26 ENCOUNTER — Encounter: Payer: Self-pay | Admitting: *Deleted

## 2019-06-26 ENCOUNTER — Encounter: Payer: Commercial Managed Care - PPO | Admitting: *Deleted

## 2019-06-26 ENCOUNTER — Other Ambulatory Visit: Payer: Self-pay

## 2019-06-26 DIAGNOSIS — Z955 Presence of coronary angioplasty implant and graft: Secondary | ICD-10-CM

## 2019-06-26 DIAGNOSIS — I214 Non-ST elevation (NSTEMI) myocardial infarction: Secondary | ICD-10-CM

## 2019-06-26 NOTE — Progress Notes (Signed)
Cardiac Individual Treatment Plan  Patient Details  Name: Coralie Stanke MRN: [id number] Date of Birth: 02/02/1971 Referring Provider:     Cardiac Rehab from 06/17/2019 in [facility] Cardiac and Pulmonary Rehab  Referring Provider  Arida      Initial Encounter Date:    Cardiac Rehab from 06/17/2019 in [facility] Cardiac and Pulmonary Rehab  Date  06/17/19      Visit Diagnosis: NSTEMI (non-ST elevated myocardial infarction) [facility])  Status post coronary artery stent placement  Patient's Home Medications on Admission:  Current Outpatient Medications:  .  aspirin 81 MG chewable tablet, Chew 1 tablet (81 mg total) by mouth daily., Disp: 30 tablet, Rfl: 11 .  atorvastatin (LIPITOR) 80 MG tablet, Take 1 tablet (80 mg total) by mouth daily at 6 PM., Disp: 30 tablet, Rfl: 11 .  calcium-vitamin D 250-100 MG-UNIT tablet, Take 1 tablet by mouth 2 (two) times daily., Disp: , Rfl:  .  ferrous sulfate 325 (65 FE) MG tablet, Take 325 mg by mouth every morning. , Disp: , Rfl:  .  furosemide (LASIX) 20 MG tablet, Take 1 tablet (20 mg total) by mouth daily., Disp: 90 tablet, Rfl: 3 .  methimazole (TAPAZOLE) 10 MG tablet, Take 1 tablet (10 mg total) by mouth 2 (two) times daily., Disp: 60 tablet, Rfl: 6 .  metoprolol succinate (TOPROL XL) 25 MG 24 hr tablet, Take 1 tablet (25 mg total) by mouth daily., Disp: 90 tablet, Rfl: 0 .  nitroGLYCERIN (NITROSTAT) 0.4 MG SL tablet, Place 1 tablet (0.4 mg total) under the tongue every 5 (five) minutes as needed for chest pain., Disp: 100 tablet, Rfl: 3 .  potassium chloride SA (K-DUR) 20 MEQ tablet, Take 1 tablet (20 mEq total) by mouth daily., Disp: 90 tablet, Rfl: 3 .  sacubitril-valsartan (ENTRESTO) 24-26 MG, Take 1 tablet by mouth 2 (two) times daily., Disp: 60 tablet, Rfl: 2 .  ticagrelor (BRILINTA) 90 MG TABS tablet, Take 1 tablet (90 mg total) by mouth 2 (two) times daily., Disp: 60 tablet, Rfl: 11  Past Medical History: Past Medical History:  Diagnosis Date  . Acute  encephalopathy   . Anemia   . CAD (coronary artery disease)   . HFrEF (heart failure with reduced ejection fraction) ([place])   . HTN (hypertension)   . Hyperthyroidism   . 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 Thyroid storm     Tobacco Use: Social History   Tobacco Use  Smoking Status Never Smoker  Smokeless Tobacco Never Used    Labs: Recent Review Flowsheet Data    Labs for ITP Cardiac and Pulmonary Rehab Latest Ref Rng & Units 05/21/2019 05/21/2019 05/21/2019 05/22/2019 05/22/2019   Cholestrol 0 - 200 mg/dL - - - - -   LDLCALC 0 - 99 mg/dL - - - - -   HDL >39.00 mg/dL - - - - -   Trlycerides <150 mg/dL - - 69 - -   Hemoglobin A1c 4.6 - 6.5 % - - - - -   PHART 7.350 - 7.450 7.290(L) 7.288(L) - 7.484(H) -   PCO2ART 32.0 - 48.0 mmHg 43.2 39.3 - 34.5 -   HCO3 20.0 - 28.0 mmol/L 20.7 18.8(L) - 26.0 22.5   TCO2 22 - 32 mmol/L 22 20(L) - 27 24   ACIDBASEDEF 0.0 - 2.0 mmol/L 6.0(H) 7.0(H) - - 2.0   O2SAT % 67.0 90.0 - 100.0 79.0       Exercise Target Goals: Exercise Program Goal: Individual exercise prescription set using results from  initial 6 min walk test and THRR while considering  patient's activity barriers and safety.   Exercise Prescription Goal: Initial exercise prescription builds to 30-45 minutes a day of aerobic activity, 2-3 days per week.  Home exercise guidelines will be given to patient during program as part of exercise prescription that the participant will acknowledge.  Activity Barriers & Risk Stratification: Activity Barriers & Cardiac Risk Stratification - 06/14/19 1218      Activity Barriers & Cardiac Risk Stratification   Activity Barriers  None    Cardiac Risk Stratification  Moderate       6 Minute Walk: 6 Minute Walk    Row Name 06/17/19 1413         6 Minute Walk   Phase  Initial     Distance  900 feet     Walk Time  6 minutes     # of Rest Breaks  0     MPH  1.7     METS  2.88     RPE  9     Perceived Dyspnea   0     VO2 Peak  10.08      Symptoms  No     Resting HR  61 bpm     Resting BP  132/80     Exercise Oxygen Saturation  during 6 min walk  98 %     Max Ex. HR  84 bpm     Max Ex. BP  126/64     2 Minute Post BP  110/66        Oxygen Initial Assessment:   Oxygen Re-Evaluation:   Oxygen Discharge (Final Oxygen Re-Evaluation):   Initial Exercise Prescription: Initial Exercise Prescription - 06/17/19 1400      Date of Initial Exercise RX and Referring Provider   Date  06/17/19    Referring Provider  Arida      Treadmill   MPH  1.8    Grade  0.5    Minutes  15    METs  2.5      Arm Ergometer   Level  1    Watts  43    RPM  25    Minutes  15    METs  2.5      T5 Nustep   Level  1    SPM  80    Minutes  15    METs  2.5      Biostep-RELP   Level  2    SPM  50    Minutes  15    METs  2      Prescription Details   Frequency (times per week)  3    Duration  Progress to 30 minutes of continuous aerobic without signs/symptoms of physical distress      Intensity   THRR 40-80% of Max Heartrate  105-149    Ratings of Perceived Exertion  11-13    Perceived Dyspnea  0-4      Resistance Training   Training Prescription  Yes    Weight  3 lb    Reps  10-15       Perform Capillary Blood Glucose checks as needed.  Exercise Prescription Changes: Exercise Prescription Changes    Row Name 06/17/19 1400             Response to Exercise   Blood Pressure (Admit)  132/80       Blood Pressure (Exercise)  126/64  Blood Pressure (Exit)  110/66       Heart Rate (Admit)  61 bpm       Heart Rate (Exercise)  84 bpm       Heart Rate (Exit)  58 bpm       Oxygen Saturation (Exercise)  98 %       Rating of Perceived Exertion (Exercise)  9       Perceived Dyspnea (Exercise)  0       Symptoms  none          Exercise Comments: Exercise Comments    Row Name 06/19/19 0953           Exercise Comments  First full day of exercise!  Patient was oriented to gym and equipment including  functions, settings, policies, and procedures.  Patient's individual exercise prescription and treatment plan were reviewed.  All starting workloads were established based on the results of the 6 minute walk test done at initial orientation visit.  The plan for exercise progression was also introduced and progression will be customized based on patient's performance and goals.          Exercise Goals and Review: Exercise Goals    Row Name 06/17/19 1426             Exercise Goals   Increase Physical Activity  Yes       Intervention  Provide advice, education, support and counseling about physical activity/exercise needs.;Develop an individualized exercise prescription for aerobic and resistive training based on initial evaluation findings, risk stratification, comorbidities and participant's personal goals.       Expected Outcomes  Short Term: Attend rehab on a regular basis to increase amount of physical activity.;Long Term: Add in home exercise to make exercise part of routine and to increase amount of physical activity.;Long Term: Exercising regularly at least 3-5 days a week.       Increase Strength and Stamina  Yes       Intervention  Provide advice, education, support and counseling about physical activity/exercise needs.;Develop an individualized exercise prescription for aerobic and resistive training based on initial evaluation findings, risk stratification, comorbidities and participant's personal goals.       Expected Outcomes  Short Term: Increase workloads from initial exercise prescription for resistance, speed, and METs.;Short Term: Perform resistance training exercises routinely during rehab and add in resistance training at home;Long Term: Improve cardiorespiratory fitness, muscular endurance and strength as measured by increased METs and functional capacity (6MWT)       Able to understand and use rate of perceived exertion (RPE) scale  Yes       Intervention  Provide education  and explanation on how to use RPE scale       Expected Outcomes  Short Term: Able to use RPE daily in rehab to express subjective intensity level;Long Term:  Able to use RPE to guide intensity level when exercising independently       Able to understand and use Dyspnea scale  Yes       Intervention  Provide education and explanation on how to use Dyspnea scale       Expected Outcomes  Short Term: Able to use Dyspnea scale daily in rehab to express subjective sense of shortness of breath during exertion;Long Term: Able to use Dyspnea scale to guide intensity level when exercising independently       Knowledge and understanding of Target Heart Rate Range (THRR)  Yes       Intervention  Provide education and explanation of THRR including how the numbers were predicted and where they are located for reference       Expected Outcomes  Short Term: Able to state/look up THRR;Short Term: Able to use daily as guideline for intensity in rehab;Long Term: Able to use THRR to govern intensity when exercising independently       Able to check pulse independently  Yes       Intervention  Provide education and demonstration on how to check pulse in carotid and radial arteries.;Review the importance of being able to check your own pulse for safety during independent exercise       Expected Outcomes  Short Term: Able to explain why pulse checking is important during independent exercise;Long Term: Able to check pulse independently and accurately       Understanding of Exercise Prescription  Yes       Intervention  Provide education, explanation, and written materials on patient's individual exercise prescription       Expected Outcomes  Short Term: Able to explain program exercise prescription;Long Term: Able to explain home exercise prescription to exercise independently          Exercise Goals Re-Evaluation : Exercise Goals Re-Evaluation    Row Name 06/19/19 0953             Exercise Goal Re-Evaluation    Exercise Goals Review  Increase Physical Activity;Increase Strength and Stamina;Able to understand and use rate of perceived exertion (RPE) scale;Knowledge and understanding of Target Heart Rate Range (THRR);Able to check pulse independently;Understanding of Exercise Prescription       Comments  Reviewed RPE scale, THR and program prescription with pt today.  Pt voiced understanding and was given a copy of goals to take home.       Expected Outcomes  Short: Use RPE daily to regulate intensity. Long: Follow program prescription in THR.          Discharge Exercise Prescription (Final Exercise Prescription Changes): Exercise Prescription Changes - 06/17/19 1400      Response to Exercise   Blood Pressure (Admit)  132/80    Blood Pressure (Exercise)  126/64    Blood Pressure (Exit)  110/66    Heart Rate (Admit)  61 bpm    Heart Rate (Exercise)  84 bpm    Heart Rate (Exit)  58 bpm    Oxygen Saturation (Exercise)  98 %    Rating of Perceived Exertion (Exercise)  9    Perceived Dyspnea (Exercise)  0    Symptoms  none       Nutrition:  Target Goals: Understanding of nutrition guidelines, daily intake of sodium <1564m, cholesterol <2058m calories 30% from fat and 7% or less from saturated fats, daily to have 5 or more servings of fruits and vegetables.  Biometrics: Pre Biometrics - 06/17/19 1415      Pre Biometrics   Height  _0  (1.626 m)    Weight  188 lb 14.4 oz (85.7 kg)    BMI (Calculated)  32.41    Single Leg Stand  26.79 seconds        Nutrition Therapy Plan and Nutrition Goals: Nutrition Therapy & Goals - 06/17/19 1448      Nutrition Therapy   Diet  low Na, HH eating    Protein (specify units)  70g    Fiber  25 grams    Whole Grain Foods  3 servings    Saturated Fats  12 max. grams  Fruits and Vegetables  5 servings/day    Sodium  1.5 grams      Personal Nutrition Goals   Nutrition Goal  ST: limit Na to 2g LT: be able to do what she did a couple of weeks ago     Comments  Pt reports living in the [place] before here and eating fresh produce and seafood. Pt reports that she has white rice with oil and salt, sautees food with a little oil, sister is cooking more and adding in vegetables and chicken. Pt didn't give full day of intake. Discussed HH eating, low Na eating. Pt receptive. Pt doesn't normally eat processed foods and usually cooks, will sometimes grab cheese from the fridge for a snack.      Intervention Plan   Intervention  Prescribe, educate and counsel regarding individualized specific dietary modifications aiming towards targeted core components such as weight, hypertension, lipid management, diabetes, heart failure and other comorbidities.;Nutrition handout(s) given to patient.    Expected Outcomes  Short Term Goal: Understand basic principles of dietary content, such as calories, fat, sodium, cholesterol and nutrients.;Short Term Goal: A plan has been developed with personal nutrition goals set during dietitian appointment.;Long Term Goal: Adherence to prescribed nutrition plan.       Nutrition Assessments: Nutrition Assessments - 06/17/19 1518      Rate Your Plate Scores   Pre Score  35       Nutrition Goals Re-Evaluation:   Nutrition Goals Discharge (Final Nutrition Goals Re-Evaluation):   Psychosocial: Target Goals: Acknowledge presence or absence of significant depression and/or stress, maximize coping skills, provide positive support system. Participant is able to verbalize types and ability to use techniques and skills needed for reducing stress and depression.   Initial Review & Psychosocial Screening: Initial Psych Review & Screening - 06/14/19 1216      Initial Review   Current issues with  Current Stress Concerns    Source of Stress Concerns  Family;Occupation    [place] lives with her son. 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 She states they are working  well with her. Her sister is still in her home country [place] she is questioning returning there sometime in the future.      Family Dynamics   Good Support System?  Yes      Barriers   Psychosocial barriers to participate in program  There are no identifiable barriers or psychosocial needs.;The patient should benefit from training in stress management and relaxation.      Screening Interventions   Interventions  Encouraged to exercise;To provide support and resources with identified psychosocial needs;Provide feedback about the scores to participant    Expected Outcomes  Short Term goal: Utilizing psychosocial counselor, staff and physician to assist with identification of specific Stressors or current issues interfering with healing process. Setting desired goal for each stressor or current issue identified.;Long Term Goal: Stressors or current issues are controlled or eliminated.;Short Term goal: Identification and review with participant of any Quality of Life or Depression concerns found by scoring the questionnaire.;Long Term goal: The participant improves quality of Life and PHQ9 Scores as seen by post scores and/or verbalization of changes       Quality of Life Scores:  Quality of Life - 06/17/19 1440      Quality of Life   Select  Quality of Life      Quality of Life  Scores   Health/Function Pre  14.17 %    Socioeconomic Pre  22.13 %    Psych/Spiritual Pre  20.57 %    Family Pre  30 %    GLOBAL Pre  19.37 %      Scores of 19 and below usually indicate a poorer quality of life in these areas.  A difference of  2-3 points is a clinically meaningful difference.  A difference of 2-3 points in the total score of the Quality of Life Index has been associated with significant improvement in overall quality of life, self-image, physical symptoms, and general health in studies assessing change in quality of life.  PHQ-9: Recent Review Flowsheet Data    Depression  screen [facility] 2/9 06/17/2019 12/04/2015   Decreased Interest 0 3   Down, Depressed, Hopeless 0 0   PHQ - 2 Score 0 3   Altered sleeping 0 1   Tired, decreased energy 0 3   Change in appetite 0 1   Feeling bad or failure about yourself  0 3   Trouble concentrating 0 0   Moving slowly or fidgety/restless 0 1   Suicidal thoughts 0 0   PHQ-9 Score 0 12   Difficult doing work/chores Not difficult at all Not difficult at all     Interpretation of Total Score  Total Score Depression Severity:  1-4 = Minimal depression, 5-9 = Mild depression, 10-14 = Moderate depression, 15-19 = Moderately severe depression, 20-27 = Severe depression   Psychosocial Evaluation and Intervention:   Psychosocial Re-Evaluation:   Psychosocial Discharge (Final Psychosocial Re-Evaluation):   Vocational Rehabilitation: Provide vocational rehab assistance to qualifying candidates.   Vocational Rehab Evaluation & Intervention: Vocational Rehab - 06/14/19 1216      Initial Vocational Rehab Evaluation & Intervention   Assessment shows need for Vocational Rehabilitation  No       Education: Education Goals: Education classes will be provided on a variety of topics geared toward better understanding of heart health and risk factor modification. Participant will state understanding/return demonstration of topics presented as noted by education test scores.  Learning Barriers/Preferences: Learning Barriers/Preferences - 06/14/19 1216      Learning Barriers/Preferences   Learning Barriers  None    Learning Preferences  None       Education Topics:  AED/CPR: - Group verbal and written instruction with the use of models to demonstrate the basic use of the AED with the basic ABC's of resuscitation.   General Nutrition Guidelines/Fats and Fiber: -Group instruction provided by verbal, written material, models and posters to present the general guidelines for heart healthy nutrition. Gives an explanation and  review of dietary fats and fiber.   Controlling Sodium/Reading Food Labels: -Group verbal and written material supporting the discussion of sodium use in heart healthy nutrition. Review and explanation with models, verbal and written materials for utilization of the food label.   Exercise Physiology & General Exercise Guidelines: - Group verbal and written instruction with models to review the exercise physiology of the cardiovascular system and associated critical values. Provides general exercise guidelines with specific guidelines to those with heart or lung disease.    Aerobic Exercise & Resistance Training: - Gives group verbal and written instruction on the various components of exercise. Focuses on aerobic and resistive training programs and the benefits of this training and how to safely progress through these programs..   Flexibility, Balance, Mind/Body Relaxation: Provides group verbal/written instruction on the benefits of flexibility and balance training, including  mind/body exercise modes such as yoga, pilates and tai chi.  Demonstration and skill practice provided.   Stress and Anxiety: - Provides group verbal and written instruction about the health risks of elevated stress and causes of high stress.  Discuss the correlation between heart/lung disease and anxiety and treatment options. Review healthy ways to manage with stress and anxiety.   Depression: - Provides group verbal and written instruction on the correlation between heart/lung disease and depressed mood, treatment options, and the stigmas associated with seeking treatment.   Anatomy & Physiology of the Heart: - Group verbal and written instruction and models provide basic cardiac anatomy and physiology, with the coronary electrical and arterial systems. Review of Valvular disease and Heart Failure   Cardiac Procedures: - Group verbal and written instruction to review commonly prescribed medications for heart  disease. Reviews the medication, class of the drug, and side effects. Includes the steps to properly store meds and maintain the prescription regimen. (beta blockers and nitrates)   Cardiac Medications I: - Group verbal and written instruction to review commonly prescribed medications for heart disease. Reviews the medication, class of the drug, and side effects. Includes the steps to properly store meds and maintain the prescription regimen.   Cardiac Medications II: -Group verbal and written instruction to review commonly prescribed medications for heart disease. Reviews the medication, class of the drug, and side effects. (all other drug classes)    Go Sex-Intimacy & Heart Disease, Get SMART - Goal Setting: - Group verbal and written instruction through game format to discuss heart disease and the return to sexual intimacy. Provides group verbal and written material to discuss and apply goal setting through the application of the S.M.A.R.T. Method.   Other Matters of the Heart: - Provides group verbal, written materials and models to describe Stable Angina and Peripheral Artery. Includes description of the disease process and treatment options available to the cardiac patient.   Exercise & Equipment Safety: - Individual verbal instruction and demonstration of equipment use and safety with use of the equipment.   Cardiac Rehab from 06/17/2019 in [facility] Cardiac and Pulmonary Rehab  Date  06/17/19  Educator  AS  Instruction Review Code  1- Verbalizes Understanding      Infection Prevention: - Provides verbal and written material to individual with discussion of infection control including proper hand washing and proper equipment cleaning during exercise session.   Cardiac Rehab from 06/17/2019 in [facility] Cardiac and Pulmonary Rehab  Date  06/17/19  Educator  AS  Instruction Review Code  1- Verbalizes Understanding      Falls Prevention: - Provides verbal and written material to  individual with discussion of falls prevention and safety.   Cardiac Rehab from 06/17/2019 in [facility] Cardiac and Pulmonary Rehab  Date  06/17/19  Educator  AS  Instruction Review Code  1- Verbalizes Understanding      Diabetes: - Individual verbal and written instruction to review signs/symptoms of diabetes, desired ranges of glucose level fasting, after meals and with exercise. Acknowledge that pre and post exercise glucose checks will be done for 3 sessions at entry of program.   Know Your Numbers and Risk Factors: -Group verbal and written instruction about important numbers in your health.  Discussion of what are risk factors and how they play a role in the disease process.  Review of Cholesterol, Blood Pressure, Diabetes, and BMI and the role they play in your overall health.   Sleep Hygiene: -Provides group verbal and written instruction  about how sleep can affect your health.  Define sleep hygiene, discuss sleep cycles and impact of sleep habits. Review good sleep hygiene tips.    Other: -Provides group and verbal instruction on various topics (see comments)   Knowledge Questionnaire Score: Knowledge Questionnaire Score - 06/17/19 1441      Knowledge Questionnaire Score   Pre Score  19/26       Core Components/Risk Factors/Patient Goals at Admission: Personal Goals and Risk Factors at Admission - 06/17/19 1414      Core Components/Risk Factors/Patient Goals on Admission    Weight Management  Yes;Obesity;Weight Loss    Intervention  Weight Management/Obesity: Establish reasonable short term and long term weight goals.    Admit Weight  188 lb 14.4 oz (85.7 kg)    Goal Weight: Short Term  178 lb (80.7 kg)    Goal Weight: Long Term  168 lb (76.2 kg)    Expected Outcomes  Weight Maintenance: Understanding of the daily nutrition guidelines, which includes 25-35% calories from fat, 7% or less cal from saturated fats, less than 266m cholesterol, less than 1.5gm of sodium, & 5 or  more servings of fruits and vegetables daily;Short Term: Continue to assess and modify interventions until short term weight is achieved;Long Term: Adherence to nutrition and physical activity/exercise program aimed toward attainment of established weight goal    Intervention  Provide a combined exercise and nutrition program that is supplemented with education, support and counseling about heart failure. Directed toward relieving symptoms such as shortness of breath, decreased exercise tolerance, and extremity edema.    Expected Outcomes  Improve functional capacity of life;Short term: Attendance in program 2-3 days a week with increased exercise capacity. Reported lower sodium intake. Reported increased fruit and vegetable intake. Reports medication compliance.;Short term: Daily weights obtained and reported for increase. Utilizing diuretic protocols set by physician.;Long term: Adoption of self-care skills and reduction of barriers for early signs and symptoms recognition and intervention leading to self-care maintenance.    Intervention  Provide education on lifestyle modifcations including regular physical activity/exercise, weight management, moderate sodium restriction and increased consumption of fresh fruit, vegetables, and low fat dairy, alcohol moderation, and smoking cessation.;Monitor prescription use compliance.    Expected Outcomes  Short Term: Continued assessment and intervention until BP is < 140/958mHG in hypertensive participants. < 130/8052mG in hypertensive participants with diabetes, heart failure or chronic kidney disease.;Long Term: Maintenance of blood pressure at goal levels.    Intervention  Provide education and support for participant on nutrition & aerobic/resistive exercise along with prescribed medications to achieve LDL <7m28mDL >40mg57m Expected Outcomes  Short Term: Participant states understanding of desired cholesterol values and is compliant with medications  prescribed. Participant is following exercise prescription and nutrition guidelines.;Long Term: Cholesterol controlled with medications as prescribed, with individualized exercise RX and with personalized nutrition plan. Value goals: LDL < 7mg,23m > 40 mg.       Core Components/Risk Factors/Patient Goals Review:    Core Components/Risk Factors/Patient Goals at Discharge (Final Review):    ITP Comments: ITP Comments    Row Name 06/14/19 1213 06/26/19 0648         ITP Comments  Virtual Initial Orientation completed. Diagnosis can be found in [facility] 8/3. EP/RD orientation scheduled for 8/31 at 1pm  30 Day review. Continue with ITP unless directed changes per Medical Director review.  New to program         Comments:

## 2019-06-26 NOTE — Progress Notes (Signed)
Daily Session Note  Patient Details  Name: Amber [name] MRN: [id number] Date of Birth: 1970/11/15 Referring Provider:     Cardiac Rehab from 06/17/2019 in [facility] Cardiac and Pulmonary Rehab  Referring Provider  Fletcher Anon      Encounter Date: 06/26/2019  Check In: Session Check In - 06/26/19 0953      Check-In   Supervising physician immediately available to respond to emergencies  See telemetry face sheet for immediately available ER MD    Location  [facility]-Cardiac & Pulmonary Rehab    Staff Present  Heath Lark, RN, BSN, CCRP;Jessica Hortense, MA, RCEP, CCRP, CCET;Joseph [place] RCP,RRT,BSRT    Virtual Visit  No    Medication changes reported      No    Fall or balance concerns reported     No    Warm-up and Cool-down  Performed on first and last piece of equipment    Resistance Training Performed  Yes    VAD Patient?  No    PAD/SET Patient?  No      Pain Assessment   Currently in Pain?  No/denies          Social History   Tobacco Use  Smoking Status Never Smoker  Smokeless Tobacco Never Used    Goals Met:  Exercise tolerated well No report of cardiac concerns or symptoms Strength training completed today  Goals Unmet:  Not Applicable  Comments: Pt able to follow exercise prescription today without complaint.  Will continue to monitor for progression.    Dr. Emily Filbert is Medical Director for [place] [facility].

## 2019-06-28 ENCOUNTER — Telehealth: Payer: Self-pay | Admitting: *Deleted

## 2019-06-28 ENCOUNTER — Other Ambulatory Visit
Admission: RE | Admit: 2019-06-28 | Discharge: 2019-06-28 | Disposition: A | Discharge status: Home / Self Care | Payer: Commercial Managed Care - PPO | Attending: Physician Assistant | Admitting: Physician Assistant

## 2019-06-28 DIAGNOSIS — I5022 Chronic systolic (congestive) heart failure: Secondary | ICD-10-CM | POA: Diagnosis not present

## 2019-06-28 DIAGNOSIS — I10 Essential (primary) hypertension: Secondary | ICD-10-CM | POA: Diagnosis present

## 2019-06-28 LAB — BASIC METABOLIC PANEL
Anion gap: 7 (ref 5–15)
BUN: 7 mg/dL (ref 6–20)
CO2: 25 mmol/L (ref 22–32)
Calcium: 9 mg/dL (ref 8.9–10.3)
Chloride: 108 mmol/L (ref 98–111)
Creatinine, Ser: 0.44 mg/dL (ref 0.44–1.00)
GFR calc Af Amer: >60 mL/min (ref >60)
GFR calc non Af Amer: >60 mL/min (ref >60)
Glucose, Bld: 118 mg/dL — ABNORMAL HIGH (ref 70–99)
Potassium: 4.1 mmol/L (ref 3.5–5.1)
Sodium: 140 mmol/L (ref 135–145)

## 2019-06-28 NOTE — Telephone Encounter (Signed)
No answer. Left message to call back.   

## 2019-06-28 NOTE — Telephone Encounter (Signed)
-----   Message from Ryan M Dunn, PA-C sent at 06/28/2019 12:35 PM EDT ----- Renal function and potassium remain normal following the transition to Entresto.  To further optimize her medical therapy, I recommend discontinuing KCl and starting spironolactone 12.5 mg daily.  When she is seen in follow-up next week we will recheck a BMP following the addition of spironolactone to her regimen. 

## 2019-07-01 ENCOUNTER — Other Ambulatory Visit: Payer: Self-pay

## 2019-07-01 ENCOUNTER — Encounter: Payer: Commercial Managed Care - PPO | Admitting: *Deleted

## 2019-07-01 DIAGNOSIS — Z955 Presence of coronary angioplasty implant and graft: Secondary | ICD-10-CM

## 2019-07-01 DIAGNOSIS — I214 Non-ST elevation (NSTEMI) myocardial infarction: Secondary | ICD-10-CM | POA: Diagnosis not present

## 2019-07-01 NOTE — Progress Notes (Signed)
Daily Session Note  Patient Details  Name: Mikeria Valin MRN: [id number] Date of Birth: 27-Jul-1971 Referring Provider:     Cardiac Rehab from 06/17/2019 in [facility] Cardiac and Pulmonary Rehab  Referring Provider  Fletcher Anon      Encounter Date: 07/01/2019  Check In: Session Check In - 07/01/19 0951      Check-In   Supervising physician immediately available to respond to emergencies  See telemetry face sheet for immediately available ER MD    Location  [facility]-Cardiac & Pulmonary Rehab    Staff Present  Heath Lark, RN, BSN, Laveda Norman, BS, ACSM CEP, Exercise Physiologist;Joseph Tessie Fass RCP,RRT,BSRT    Virtual Visit  No    Medication changes reported      No    Fall or balance concerns reported     No    Warm-up and Cool-down  Performed on first and last piece of equipment    Resistance Training Performed  Yes    VAD Patient?  No    PAD/SET Patient?  No      Pain Assessment   Currently in Pain?  No/denies          Social History   Tobacco Use  Smoking Status Never Smoker  Smokeless Tobacco Never Used    Goals Met:  Exercise tolerated well Personal goals reviewed No report of cardiac concerns or symptoms  Goals Unmet:  Not Applicable  Comments: Pt able to follow exercise prescription today without complaint.  Will continue to monitor for progression.  Reviewed home exercise with pt today.  Pt plans to walk and use videos at home for exercise.  Reviewed THR, pulse, RPE, sign and symptoms, NTG use, and when to call 911 or MD.  Also discussed weather considerations and indoor options.  Pt voiced understanding.   Dr. Emily Filbert is Medical Director for [place] [facility].

## 2019-07-01 NOTE — Telephone Encounter (Signed)
2nd attempt to call the patient. No answer- I left a message to please call back.  

## 2019-07-02 NOTE — Telephone Encounter (Signed)
No answer. Left message to call back.   

## 2019-07-02 NOTE — Telephone Encounter (Signed)
3rd attempt to contact the patient with results. [email or web address].

## 2019-07-02 NOTE — Telephone Encounter (Signed)
-----   Message from Rise Mu, PA-C sent at 06/28/2019 12:35 PM EDT ----- Renal function and potassium remain normal following the transition to [facility].  To further optimize her medical therapy, I recommend discontinuing KCl and starting spironolactone 12.5 mg daily.  When she is seen in follow-up next week we will recheck a BMP following the addition of spironolactone to her regimen.

## 2019-07-03 ENCOUNTER — Encounter: Payer: Commercial Managed Care - PPO | Admitting: *Deleted

## 2019-07-03 ENCOUNTER — Encounter: Payer: Self-pay | Admitting: *Deleted

## 2019-07-03 ENCOUNTER — Other Ambulatory Visit: Payer: Self-pay

## 2019-07-03 DIAGNOSIS — I214 Non-ST elevation (NSTEMI) myocardial infarction: Secondary | ICD-10-CM

## 2019-07-03 DIAGNOSIS — Z955 Presence of coronary angioplasty implant and graft: Secondary | ICD-10-CM

## 2019-07-03 NOTE — Progress Notes (Signed)
Cardiology Office Note    Date:  07/05/2019   ID:  Amber [name], DOB September 30, 1971, MRN [id number]  PCP:  Doreene Nest, NP  Cardiologist:  Lorine Bears, MD  Electrophysiologist:  None   Chief Complaint: Follow up  History of Present Illness:   Amber [name] is a 48 y.o. 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   Patient was seen by PCP in 03/2019 with complaints of unexplained weight loss, palpitations, anxiety, insomnia, and shortness of breath with TSH being found to be suppressed at less than 0.01 with an elevated free T4 of 5.96. In this setting she was started on methimazole. She was admitted to [facility] in 05/2019, with acute hypoxic respiratory failure requiring mechanical ventilation in the setting of possible atypical pneumonia, non-STEMI, pulmonary edema, and hyperthyroidism with thyroid storm in the setting of medication noncompliance. EKG showed sinus tachycardia, 130s bpm, LVH, ST depression consistent with repolarization abnormalities. D-dimer was less than 0.27. High-sensitivity troponin was 57 with a delta troponin of 162, and peaking at 9688. WBC was 19.8. Chest CT revealed patchy groundglass infiltrates bilaterally predominantly within the bases. COVID-19 test was negative. 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 Patient's cath had to be delayed until 8/5 in the setting of fever of 102.6 on 8/4. Patient underwent right and left cardiac cath on 05/22/2019 which  showed significant two-vessel CAD with 80% mid LAD stenosis, occluded OM branches and moderate RCA disease as outlined below. Right heart catheterization showed high normal filling pressures with a mean RA pressure of 8 mmHg, pulmonary capillary wedge pressure of 12 to 13 mmHg, mild pulmonary hypertension and high cardiac output at 8 L/min. The patient underwent successful PCI/DES to the mid LAD. Documented discharge weight of 80.7 kg.  Following her discharge, she was seen in the ED on 05/27/2019 with large-volume watery diarrhea following recent antibiotics for potential pneumonia during her admission above. C. difficile and GI panel were negative. Patient was seen by PCP on 05/30/2019 and was feeling well. Her weight remained stable around 182 to 183 pounds. She had not taken any Lasix.  She was seen by cardiology on 06/06/2019 and was doing well with some slow weight increase from 182 to 187.4 pounds with pedal edema.  She was compliant with diet and medications.  Her BP was running in the 130s to 140s systolic.  She was started on Lasix 20 mg daily with follow up labs showing stable renal function as below.  She was most recently seen in the office on 06/21/2019 and doing well.  Her weight has gradually trended up to 190 pounds.  She had a ReDs vest reading of 29.  Her weight gain was felt to be in the setting of improved control of her thyroid function in the setting of hyperthyroidism.  She underwent ACE inhibitor washout and initiation of Entresto as well as transition of Lopressor to Toprol-XL and was continued on Lasix 20 mg daily.  Follow-up labs on 06/28/2019 demonstrated stable renal function and potassium as outlined below.  In this setting, she was started on spironolactone 12.5 mg daily.  Unfortunately, we are unable to get a hold of the patient and she was unable to start spironolactone prior to today's visit.  She comes in doing very well today.  She denies any chest pain, shortness of breath,  palpitations, dizziness, presyncope, or syncope.  No lower extremity swelling, abdominal distention, PND, orthopnea, or early satiety.  She is watching her salt intake and drinking less than 2 L of fluids per day.  She is compliant with medications.  As noted above, she did not pick up spironolactone.  She reports her blood pressures at home have been running in the 140s to 150s systolic.  She does continue to note slow gradual weight gain which is felt to be in the setting of improved treatment of her hyperthyroidism in the setting of a diet that was likely higher in caloric intake while she was significantly hyperthyroid.  No falls, BRBPR, or melena.  She is compliant with dual antiplatelet therapy and denies missing any doses.  Overall, she does not have any issues or concerns today.  She would like to return back to work at the end of this month.   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   Thyroid storm     Past Surgical History:  Procedure Laterality Date   CESAREAN SECTION     x2   CORONARY STENT INTERVENTION N/A 05/22/2019   Procedure: CORONARY STENT INTERVENTION;  Surgeon: Iran OuchArida, Muhammad A, MD;  Location: [facility] INVASIVE CV LAB;  Service: Cardiovascular;  Laterality: N/A;   MASS EXCISION Right 01/18/2013   Procedure: EXCISION right scapular cyst;  Surgeon: Axel FillerArmando Ramirez, MD;  Location: [facility] ORS;  Service: General;  Laterality: Right;   RIGHT/LEFT HEART CATH AND CORONARY ANGIOGRAPHY N/A 05/22/2019     Procedure: RIGHT/LEFT HEART CATH AND CORONARY ANGIOGRAPHY;  Surgeon: Iran OuchArida, Muhammad A, MD;  Location: [facility] INVASIVE CV LAB;  Service: Cardiovascular;  Laterality: N/A;    Current Medications: Current Meds  Medication Sig   aspirin 81 MG chewable tablet Chew 1 tablet (81 mg total) by mouth daily.   atorvastatin (LIPITOR) 80 MG tablet Take 1 tablet (80 mg total) by mouth daily at 6 PM.   calcium-vitamin D 250-100 MG-UNIT tablet Take 1 tablet by mouth 2 (two) times daily.   ferrous sulfate 325 (65 FE) MG tablet Take 325 mg by mouth every morning.    furosemide (LASIX) 20 MG tablet Take 1 tablet (20 mg total) by mouth daily.   methimazole (TAPAZOLE) 10 MG tablet Take 1 tablet (10 mg total) by mouth 2 (two) times daily.   metoprolol succinate (TOPROL XL) 25 MG 24 hr tablet Take 1 tablet (25 mg total) by mouth daily.   nitroGLYCERIN (NITROSTAT) 0.4 MG SL tablet Place 1 tablet (0.4 mg total) under the tongue every 5 (five) minutes as needed for chest pain.   potassium chloride SA (K-DUR) 20 MEQ tablet Take 1 tablet (20 mEq total) by mouth daily.   sacubitril-valsartan (ENTRESTO) 24-26 MG Take 1 tablet by mouth 2 (  two) times daily.   ticagrelor (BRILINTA) 90 MG TABS tablet Take 1 tablet (90 mg total) by mouth 2 (two) times daily.    Allergies:   Patient has no known allergies.   Social History   Socioeconomic History   Marital status: Married    Spouse name: Not on file   Number of children: 2   Years of education: Not on file   Highest education level: Not on file  Occupational History    Comment: front desk   Social Needs   Financial resource strain: Not on file   Food insecurity    Worry: Not on file    Inability: Not on file   Transportation needs    Medical: Not on file    Non-medical: Not on file  Tobacco Use   Smoking status: Never Smoker   Smokeless tobacco: Never Used  Substance and Sexual Activity   Alcohol use: Yes    Alcohol/week: 0.0 standard  drinks    Comment: rare   Drug use: No   Sexual activity: Not on file  Lifestyle   Physical activity    Days per week: Not on file    Minutes per session: Not on file   Stress: Not on file  Relationships   Social connections    Talks on phone: Not on file    Gets together: Not on file    Attends religious service: Not on file    Active member of club or organization: Not on file    Attends meetings of clubs or organizations: Not on file    Relationship status: Not on file  Other Topics Concern   Not on file  Social History Narrative   From [place]   Married.   2 children.   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     Family History:  The patient's family history includes Diabetes in her father, mother, and sister; Heart disease in her mother; Hypertension in her sister.  ROS:   Review of Systems  Constitutional: Negative for chills, diaphoresis, fever, malaise/fatigue and weight loss.  HENT: Negative for congestion.   Eyes: Negative for discharge and redness.  Respiratory: Negative for cough, hemoptysis, sputum production, shortness of breath and wheezing.   Cardiovascular: Negative for chest pain, palpitations, orthopnea, claudication, leg swelling and PND.  Gastrointestinal: Negative for abdominal pain, blood in stool, heartburn, melena, nausea and vomiting.  Genitourinary: Negative for hematuria.  Musculoskeletal: Negative for falls and myalgias.  Skin: Negative for rash.  Neurological: Negative for dizziness, tingling, tremors, sensory change, speech change, focal weakness, loss of consciousness and weakness.  Endo/Heme/Allergies: Does not bruise/bleed easily.  Psychiatric/Behavioral: Negative for substance abuse. The patient is not nervous/anxious.   All other systems reviewed and are negative.    EKGs/Labs/Other Studies Reviewed:    Studies reviewed were summarized above. The additional studies  were reviewed today:  R/LHC 05/22/2019:  1st Mrg lesion is 100% stenosed.  2nd Mrg lesion is 100% stenosed.  Mid LAD lesion is 80% stenosed.  Post intervention, there is a 0% residual stenosis.  A drug-eluting stent was successfully placed using a STENT RESOLUTE ONYX 3.0X18.  2nd Diag lesion is 30% stenosed.  Prox RCA to Mid RCA lesion is 50% stenosed.  RPAV lesion is 40% stenosed.  RPDA lesion is 30% stenosed.  1. Significant two-vessel coronary artery disease with 80% mid LAD stenosis, occluded OM branches and moderate RCA disease. 2. Left ventricular  angiography was not performed. EF was severely reduced by echo. 3. Right heart catheterization showed high normal filling pressures with mean RA pressure of 8 mmHg, pulmonary capillary wedge pressure of 12 to 13 mmHg, mild pulmonary hypertension and high cardiac output at 8 L/min. 4. Successful angioplasty and drug-eluting stent placement to the mid LAD.  Recommendations: Continue dual antiplatelet therapy for at least 1 year. The patient needs aggressive medical therapy for the rest of her coronary artery disease. I added high-dose atorvastatin. I added oral metoprolol to control tachycardia. I discontinued IV furosemide for now and she might require resumption of this either intravenously or orally as needed. The patient was alert throughout the procedure . Her hemodynamics are good enough to allow extubation if no other issues. __________  2D Echo 05/21/2019: 1. The left ventricle has severely reduced systolic function, with an ejection fraction of 25-30%. The cavity size was moderately dilated. Left ventricular diastolic parameters were normal. 2. 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 3. The right ventricle has normal systolic function. The cavity was normal. There is no increase in right ventricular wall thickness. 4. The aortic valve is tricuspid. Mild thickening of the aortic  valve. 5. The aorta is normal in size and structure.   EKG:  EKG is ordered today.  The EKG ordered today demonstrates NSR, 63 bpm, LVH, nonspecific ST-T changes (unchanged from prior)  Recent Labs: 05/20/2019: ALT 15; B Natriuretic Peptide 137.5 05/24/2019: Magnesium 2.0 05/30/2019: Hemoglobin 10.1; Platelets 337.0 06/04/2019: TSH <0.01 Repeated and verified X2. 06/28/2019: BUN 7; Creatinine, Ser 0.44; Potassium 4.1; Sodium 140  Recent Lipid Panel    Component Value Date/Time   CHOL 132 11/21/2018 0911   TRIG 69 05/21/2019 0512   HDL 32.10 (L) 11/21/2018 0911   CHOLHDL 4 11/21/2018 0911   VLDL 18.2 11/21/2018 0911   LDLCALC 82 11/21/2018 0911    PHYSICAL EXAM:    VS:  BP 140/84 (BP Location: Left Arm, Patient Position: Sitting, Cuff Size: Normal)    Pulse 63    Ht 5\' 4"  (1.626 m)    Wt 192 lb 8 oz (87.3 kg)    LMP 06/19/2017 (Approximate)    BMI 33.04 kg/m   BMI: Body mass index is 33.04 kg/m.  Physical Exam  Constitutional: She is oriented to person, place, and time. She appears well-developed and well-nourished.  HENT:  Head: Normocephalic and atraumatic.  Eyes: Right eye exhibits no discharge. Left eye exhibits no discharge.  Neck: Normal range of motion. No JVD present.  Cardiovascular: Normal rate, regular rhythm, S1 normal, S2 normal and normal heart sounds. Exam reveals no distant heart sounds, no friction rub, no midsystolic click and no opening snap.  No murmur heard. Pulses:      Posterior tibial pulses are 2+ on the right side and 2+ on the left side.  Pulmonary/Chest: Effort normal and breath sounds normal. No respiratory distress. She has no decreased breath sounds. She has no wheezes. She has no rales. She exhibits no tenderness.  Abdominal: Soft. She exhibits no distension. There is no abdominal tenderness.  Musculoskeletal:        General: No edema.  Neurological: She is alert and oriented to person, place, and time.  Skin: Skin is warm and dry. No cyanosis.  Nails show no clubbing.  Psychiatric: She has a normal mood and affect. Her speech is normal and behavior is normal. Judgment and thought content normal.    Wt Readings from Last 3 Encounters:  07/05/19 192 lb 8 oz (87.3 kg)  06/21/19 190 lb (86.2 kg)  06/17/19 188 lb 14.4 oz (85.7 kg)     ASSESSMENT & PLAN:   1. CAD involving the native coronary arteries: She is doing well without any symptoms concerning for angina.  Continue dual antiplatelet therapy with aspirin and Brilinta without interruption through at least 12 months dating back to the date of her PCI which was 05/22/2019.  Continue beta-blocker and statin therapy as outlined below.  Participating in cardiac rehab.  No plans for further ischemic evaluation.  2. 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  Tolerating Entresto without issues.  Blood pressure continues to run on the high side.  Unfortunately, we were unable to get in touch with her following her labs drawn last week and she did not start spironolactone.  In this setting, we will titrate her Entresto to 49/51 mg twice daily.  She will continue current doses of Toprol-XL and Lasix.  Check BMP today.  If renal function and potassium allow would look to add spironolactone in follow-up to further optimize her evidence-based therapy.  Schedule echocardiogram for after 08/22/2019 to reevaluate her LV systolic function.  If at that time, despite revascularization and optimization of medical therapy, her EF remains less than 35% we will refer her to EP for consideration of ICD.  3. Hyperthyroidism: She reports compliance with methimazole.  Followed by endocrinology.  Her slow gradual weight gain is likely in the setting of improved treatment of her hyperthyroidism with a diet that historically has been high in calories.  Recommend heart healthy diet that is low in salt and fat.  No evidence of volume overload.  Follow-up with endocrinology and  PCP as directed.  4. Hypertension: Blood pressure remains mildly elevated today.  Titrate Entresto as outlined above.  Otherwise, she will continue current dose of Lasix and Toprol-XL.  Low-sodium diet recommended.  Weight loss recommended.  Following repeat echo in 08/2019 he may need to consider referring to pulmonology for consideration of sleep study.  5. Hyperlipidemia: LDL of 82 from 11/2018 with the patient not on statin therapy at that time.  She was started on Lipitor in 05/2019 with a goal LDL of less than 70.  Recheck fasting lipid panel and liver function in 07/2019.  Disposition: F/u with Dr. Kirke Corin or an APP in 1 month.   Medication Adjustments/Labs and Tests Ordered: Current medicines are reviewed at length with the patient today.  Concerns regarding medicines are outlined above. Medication changes, Labs and Tests ordered today are summarized above and listed in the Patient Instructions accessible in Encounters.   Signed, Eula Listen, PA-C 07/05/2019 10:26 AM     [facility] [place] [facility] [address] [place], [place] [address] [phone]

## 2019-07-03 NOTE — Telephone Encounter (Signed)
Patient has been contacted a total of 4 times to discuss results without success.  Letter mailed to the patient to please call the office.

## 2019-07-03 NOTE — Progress Notes (Signed)
Daily Session Note  Patient Details  Name: Amber [name] MRN: [id number] Date of Birth: 09/26/1971 Referring Provider:     Cardiac Rehab from 06/17/2019 in [facility]  Referring Provider  Arida      Encounter Date: 07/03/2019  Check In: Session Check In - 07/03/19 0946      Check-In   Supervising physician immediately available to respond to emergencies  See telemetry face sheet for immediately available ER MD    Location  [facility]    Staff Present   , RN BSN;Jessica Hawkins, MA, RCEP, CCRP, CCET;Joseph Hood RCP,RRT,BSRT    Virtual Visit  No    Medication changes reported      No    Fall or balance concerns reported     No    Warm-up and Cool-down  Performed on first and last piece of equipment    Resistance Training Performed  Yes    VAD Patient?  No    PAD/SET Patient?  No      Pain Assessment   Currently in Pain?  No/denies          Social History   Tobacco Use  Smoking Status Never Smoker  Smokeless Tobacco Never Used    Goals Met:  Independence with exercise equipment Exercise tolerated well No report of cardiac concerns or symptoms Strength training completed today  Goals Unmet:  Not Applicable  Comments: Weight up today approx 4lbs from 07/01/19. Denies and trouble breathing, SOB, or swelling in extremities. Some recent med changes have been made by MD but states she is still on a diuretic. Will f/u with MD as she has appt on Friday and monitor weight and symptoms for next 2 days. Pt able to follow exercise prescription today without complaint.  Will continue to monitor for progression.    Dr. Mark Miller is Medical Director for [facility] and [facility]. 

## 2019-07-04 ENCOUNTER — Ambulatory Visit: Payer: Commercial Managed Care - PPO | Admitting: Internal Medicine

## 2019-07-05 ENCOUNTER — Ambulatory Visit (INDEPENDENT_AMBULATORY_CARE_PROVIDER_SITE_OTHER): Payer: Commercial Managed Care - PPO | Admitting: Physician Assistant

## 2019-07-05 ENCOUNTER — Encounter: Payer: Self-pay | Admitting: Physician Assistant

## 2019-07-05 ENCOUNTER — Other Ambulatory Visit: Payer: Self-pay

## 2019-07-05 VITALS — BP 140/84 | HR 63 | Ht 64.0 in | Wt 192.5 lb

## 2019-07-05 DIAGNOSIS — E0590 Thyrotoxicosis, unspecified without thyrotoxic crisis or storm: Secondary | ICD-10-CM

## 2019-07-05 DIAGNOSIS — I10 Essential (primary) hypertension: Secondary | ICD-10-CM

## 2019-07-05 DIAGNOSIS — I2510 Atherosclerotic heart disease of native coronary artery without angina pectoris: Secondary | ICD-10-CM

## 2019-07-05 DIAGNOSIS — I255 Ischemic cardiomyopathy: Secondary | ICD-10-CM | POA: Diagnosis not present

## 2019-07-05 DIAGNOSIS — I5022 Chronic systolic (congestive) heart failure: Secondary | ICD-10-CM | POA: Diagnosis not present

## 2019-07-05 DIAGNOSIS — E785 Hyperlipidemia, unspecified: Secondary | ICD-10-CM

## 2019-07-05 MED ORDER — ENTRESTO 49-51 MG PO TABS: 1.0000 | ORAL_TABLET | Freq: Two times a day (BID) | ORAL | 6 refills | Status: DC

## 2019-07-05 NOTE — Patient Instructions (Signed)
Medication Instructions:  1- INC Entresto Take 1 tablet by mouth 2 (two) times daily. If you need a refill on your cardiac medications before your next appointment, please call your pharmacy.   Lab work: Your physician recommends that you have lab work today(BMET)   If you have labs (blood work) drawn today and your tests are completely normal, you will receive your results only by: Marland Kitchen MyChart Message (if you have MyChart) OR . A paper copy in the mail If you have any lab test that is abnormal or we need to change your treatment, we will call you to review the results.  Testing/Procedures: 1- Echo  Please return to [facility] on ________(after 11/5)______ at _______________ AM/PM for an Echocardiogram. Your physician has requested that you have an echocardiogram. Echocardiography is a painless test that uses sound waves to create images of your heart. It provides your doctor with information about the size and shape of your heart and how well your heart's chambers and valves are working. This procedure takes approximately one hour. There are no restrictions for this procedure. Please note; depending on visual quality an IV may need to be placed.    Follow-Up: At [facility], you and your health needs are our priority.  As part of our continuing mission to provide you with exceptional heart care, we have created designated Provider Care Teams.  These Care Teams include your primary Cardiologist (physician) and Advanced Practice Providers (APPs -  Physician Assistants and Nurse Practitioners) who all work together to provide you with the care you need, when you need it. You will need a follow up appointment in 1 months.  You may see [name], MD or Christell Faith, PA-C.    Any Other Special Instructions Will Be Listed Below (If Applicable). Return to work 9/30 (letter provided)

## 2019-07-06 LAB — BASIC METABOLIC PANEL
BUN/Creatinine Ratio: 22 (ref 9–23)
BUN: 13 mg/dL (ref 6–24)
CO2: 12 mmol/L — ABNORMAL LOW (ref 20–29)
Calcium: 9.4 mg/dL (ref 8.7–10.2)
Chloride: 107 mmol/L — ABNORMAL HIGH (ref 96–106)
Creatinine, Ser: 0.59 mg/dL (ref 0.57–1.00)
GFR calc Af Amer: 124 mL/min/{1.73_m2} (ref >59)
GFR calc non Af Amer: 108 mL/min/{1.73_m2} (ref >59)
Glucose: 117 mg/dL — ABNORMAL HIGH (ref 65–99)
Potassium: 4.8 mmol/L (ref 3.5–5.2)
Sodium: 139 mmol/L (ref 134–144)

## 2019-07-08 ENCOUNTER — Encounter: Payer: Commercial Managed Care - PPO | Admitting: *Deleted

## 2019-07-08 ENCOUNTER — Other Ambulatory Visit: Payer: Self-pay

## 2019-07-08 DIAGNOSIS — I214 Non-ST elevation (NSTEMI) myocardial infarction: Secondary | ICD-10-CM

## 2019-07-08 DIAGNOSIS — Z955 Presence of coronary angioplasty implant and graft: Secondary | ICD-10-CM

## 2019-07-08 NOTE — Progress Notes (Signed)
Daily Session Note  Patient Details  Name: Amber [name] MRN: [id number] Date of Birth: 1971/07/15 Referring Provider:     Cardiac Rehab from 06/17/2019 in [facility]  Referring Provider  Fletcher Anon      Encounter Date: 07/08/2019  Check In: Session Check In - 07/08/19 0946      Check-In   Supervising physician immediately available to respond to emergencies  See telemetry face sheet for immediately available ER MD    Location  [facility]    Staff Present  Heath Lark, RN, BSN, CCRP;Joseph Hood RCP,RRT,BSRT;Kelly [place], [place], ACSM CEP, Exercise Physiologist    Virtual Visit  No    Medication changes reported      No    Fall or balance concerns reported     No    Warm-up and Cool-down  Performed on first and last piece of equipment    Resistance Training Performed  Yes    VAD Patient?  No    PAD/SET Patient?  No      Pain Assessment   Currently in Pain?  No/denies          Social History   Tobacco Use  Smoking Status Never Smoker  Smokeless Tobacco Never Used    Goals Met:  Independence with exercise equipment Exercise tolerated well No report of cardiac concerns or symptoms  Goals Unmet:  Not Applicable  Comments: Pt able to follow exercise prescription today without complaint.  Will continue to monitor for progression.    Dr. Emily Filbert is Medical Director for [place] and [facility].

## 2019-07-09 ENCOUNTER — Telehealth: Payer: Self-pay

## 2019-07-09 NOTE — Telephone Encounter (Signed)
Spoke to patient to review lab results and POC. She verbalized understanding and had no further questions at this time.   Advised pt to call for any further questions or concerns.

## 2019-07-09 NOTE — Telephone Encounter (Signed)
-----   Message from Rise Mu, PA-C sent at 07/07/2019 10:26 AM EDT ----- Random glucose is ok.  Renal function remains normal.  Potassium is at goal and high normal.  Will hold off on addition of spironolactone at this time given recently titrated Entresto with high-normal potassium. In follow up, consider addition of spironolactone if BP and labs allow.

## 2019-07-15 ENCOUNTER — Other Ambulatory Visit (INDEPENDENT_AMBULATORY_CARE_PROVIDER_SITE_OTHER): Payer: Commercial Managed Care - PPO

## 2019-07-15 ENCOUNTER — Other Ambulatory Visit: Payer: Self-pay

## 2019-07-15 DIAGNOSIS — E0590 Thyrotoxicosis, unspecified without thyrotoxic crisis or storm: Secondary | ICD-10-CM

## 2019-07-15 LAB — T4, FREE: Free T4: 0.69 ng/dL (ref 0.60–1.60)

## 2019-07-15 LAB — TSH: TSH: <0.01 u[IU]/mL — ABNORMAL LOW (ref 0.35–4.50)

## 2019-07-16 ENCOUNTER — Telehealth: Payer: Self-pay | Admitting: Internal Medicine

## 2019-07-16 DIAGNOSIS — E0500 Thyrotoxicosis with diffuse goiter without thyrotoxic crisis or storm: Secondary | ICD-10-CM

## 2019-07-16 NOTE — Telephone Encounter (Signed)
lft vm with results 

## 2019-07-16 NOTE — Telephone Encounter (Signed)
Please let her know her thyroid continues to do better and to continue current dose of methimazole     Thanks   Abby Nena Jordan, MD  [facility] Endocrinology  [facility] [address] [place],  [address] Phone: [phone] FAX: [phone]

## 2019-07-17 ENCOUNTER — Telehealth: Payer: Self-pay

## 2019-07-17 NOTE — Telephone Encounter (Signed)
LMOM

## 2019-07-22 ENCOUNTER — Encounter: Payer: Commercial Managed Care - PPO | Attending: Cardiovascular Disease

## 2019-07-22 DIAGNOSIS — I2510 Atherosclerotic heart disease of native coronary artery without angina pectoris: Secondary | ICD-10-CM | POA: Insufficient documentation

## 2019-07-22 DIAGNOSIS — I214 Non-ST elevation (NSTEMI) myocardial infarction: Secondary | ICD-10-CM | POA: Insufficient documentation

## 2019-07-22 DIAGNOSIS — E0591 Thyrotoxicosis, unspecified with thyrotoxic crisis or storm: Secondary | ICD-10-CM | POA: Insufficient documentation

## 2019-07-22 DIAGNOSIS — I5020 Unspecified systolic (congestive) heart failure: Secondary | ICD-10-CM | POA: Insufficient documentation

## 2019-07-22 DIAGNOSIS — Z79899 Other long term (current) drug therapy: Secondary | ICD-10-CM | POA: Insufficient documentation

## 2019-07-22 DIAGNOSIS — Z955 Presence of coronary angioplasty implant and graft: Secondary | ICD-10-CM | POA: Insufficient documentation

## 2019-07-22 DIAGNOSIS — Z7901 Long term (current) use of anticoagulants: Secondary | ICD-10-CM | POA: Insufficient documentation

## 2019-07-22 DIAGNOSIS — Z7982 Long term (current) use of aspirin: Secondary | ICD-10-CM | POA: Insufficient documentation

## 2019-07-22 DIAGNOSIS — G9340 Encephalopathy, unspecified: Secondary | ICD-10-CM | POA: Insufficient documentation

## 2019-07-22 DIAGNOSIS — E0590 Thyrotoxicosis, unspecified without thyrotoxic crisis or storm: Secondary | ICD-10-CM | POA: Insufficient documentation

## 2019-07-24 ENCOUNTER — Encounter: Payer: Self-pay | Admitting: *Deleted

## 2019-07-24 ENCOUNTER — Telehealth: Payer: Self-pay

## 2019-07-24 DIAGNOSIS — Z955 Presence of coronary angioplasty implant and graft: Secondary | ICD-10-CM

## 2019-07-24 DIAGNOSIS — I214 Non-ST elevation (NSTEMI) myocardial infarction: Secondary | ICD-10-CM

## 2019-07-24 NOTE — Telephone Encounter (Signed)
Patient returned call stating that she is going to get the Brilinta Rx refilled for 10 days at a time for at least one month while she is deciding whether or not to apply for Medicaid or possibly get this Rx sent to her from [place] She states she does not know much about her current health plan or what the deductible is so she is unsure why the cost is so high for this medication. Patient states she will let us know in the future if she needs any further  assistance getting this medication filled.

## 2019-07-24 NOTE — Telephone Encounter (Signed)
Received fax from [place] stating "Brilinta not covered by insurance. Patient cost is $400." Per [facility] pharmacist, this pharmacy does not use covermymeds.com so I initiated PA through  [employer] in covermymeds. Received response stating "Drug is covered by current benefit plan. No further PA activity required." Pharmacy informed. Pharmacist states then there must be another issue such as the patient possibly having a large deductible. She also states patient picked up a 10 day supply for $54 which was covered by insurance however she does not know why she did not pick up the full supply. Call attempted to discuss with patient. Left voice mail message requesting for her to call back.

## 2019-07-24 NOTE — Progress Notes (Signed)
Cardiac Individual Treatment Plan  Patient Details  Name: Amber [name] MRN: [id number] Date of Birth: October 16, 1971 Referring Provider:     Cardiac Rehab from 06/17/2019 in [facility] Cardiac and Pulmonary Rehab  Referring Provider  Arida      Initial Encounter Date:    Cardiac Rehab from 06/17/2019 in [facility] Cardiac and Pulmonary Rehab  Date  06/17/19      Visit Diagnosis: NSTEMI (non-ST elevated myocardial infarction) [facility])  Status post coronary artery stent placement  Patient's Home Medications on Admission:  Current Outpatient Medications:  .  aspirin 81 MG chewable tablet, Chew 1 tablet (81 mg total) by mouth daily., Disp: 30 tablet, Rfl: 11 .  atorvastatin (LIPITOR) 80 MG tablet, Take 1 tablet (80 mg total) by mouth daily at 6 PM., Disp: 30 tablet, Rfl: 11 .  calcium-vitamin D 250-100 MG-UNIT tablet, Take 1 tablet by mouth 2 (two) times daily., Disp: , Rfl:  .  ferrous sulfate 325 (65 FE) MG tablet, Take 325 mg by mouth every morning. , Disp: , Rfl:  .  furosemide (LASIX) 20 MG tablet, Take 1 tablet (20 mg total) by mouth daily., Disp: 90 tablet, Rfl: 3 .  methimazole (TAPAZOLE) 10 MG tablet, Take 1 tablet (10 mg total) by mouth 2 (two) times daily., Disp: 60 tablet, Rfl: 6 .  metoprolol succinate (TOPROL XL) 25 MG 24 hr tablet, Take 1 tablet (25 mg total) by mouth daily., Disp: 90 tablet, Rfl: 0 .  nitroGLYCERIN (NITROSTAT) 0.4 MG SL tablet, Place 1 tablet (0.4 mg total) under the tongue every 5 (five) minutes as needed for chest pain., Disp: 100 tablet, Rfl: 3 .  potassium chloride SA (K-DUR) 20 MEQ tablet, Take 1 tablet (20 mEq total) by mouth daily., Disp: 90 tablet, Rfl: 3 .  sacubitril-valsartan (ENTRESTO) 49-51 MG, Take 1 tablet by mouth 2 (two) times daily., Disp: 60 tablet, Rfl: 6 .  ticagrelor (BRILINTA) 90 MG TABS tablet, Take 1 tablet (90 mg total) by mouth 2 (two) times daily., Disp: 60 tablet, Rfl: 11  Past Medical History: Past Medical History:  Diagnosis Date  . Acute  encephalopathy   . Anemia   . CAD (coronary artery disease)   . HFrEF (heart failure with reduced ejection fraction) ([place])   . HTN (hypertension)   . Hyperthyroidism   . 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 Thyroid storm     Tobacco Use: Social History   Tobacco Use  Smoking Status Never Smoker  Smokeless Tobacco Never Used    Labs: Recent Review Flowsheet Data    Labs for ITP Cardiac and Pulmonary Rehab Latest Ref Rng & Units 05/21/2019 05/21/2019 05/21/2019 05/22/2019 05/22/2019   Cholestrol 0 - 200 mg/dL - - - - -   LDLCALC 0 - 99 mg/dL - - - - -   HDL >39.00 mg/dL - - - - -   Trlycerides <150 mg/dL - - 69 - -   Hemoglobin A1c 4.6 - 6.5 % - - - - -   PHART 7.350 - 7.450 7.290(L) 7.288(L) - 7.484(H) -   PCO2ART 32.0 - 48.0 mmHg 43.2 39.3 - 34.5 -   HCO3 20.0 - 28.0 mmol/L 20.7 18.8(L) - 26.0 22.5   TCO2 22 - 32 mmol/L 22 20(L) - 27 24   ACIDBASEDEF 0.0 - 2.0 mmol/L 6.0(H) 7.0(H) - - 2.0   O2SAT % 67.0 90.0 - 100.0 79.0       Exercise Target Goals: Exercise Program Goal: Individual exercise prescription set using results from  initial 6 min walk test and THRR while considering  patient's activity barriers and safety.   Exercise Prescription Goal: Initial exercise prescription builds to 30-45 minutes a day of aerobic activity, 2-3 days per week.  Home exercise guidelines will be given to patient during program as part of exercise prescription that the participant will acknowledge.  Activity Barriers & Risk Stratification: Activity Barriers & Cardiac Risk Stratification - 06/14/19 1218      Activity Barriers & Cardiac Risk Stratification   Activity Barriers  None    Cardiac Risk Stratification  Moderate       6 Minute Walk: 6 Minute Walk    Row Name 06/17/19 1413         6 Minute Walk   Phase  Initial     Distance  900 feet     Walk Time  6 minutes     # of Rest Breaks  0     MPH  1.7     METS  2.88     RPE  9     Perceived Dyspnea   0     VO2 Peak  10.08      Symptoms  No     Resting HR  61 bpm     Resting BP  132/80     Exercise Oxygen Saturation  during 6 min walk  98 %     Max Ex. HR  84 bpm     Max Ex. BP  126/64     2 Minute Post BP  110/66        Oxygen Initial Assessment:   Oxygen Re-Evaluation:   Oxygen Discharge (Final Oxygen Re-Evaluation):   Initial Exercise Prescription: Initial Exercise Prescription - 06/17/19 1400      Date of Initial Exercise RX and Referring Provider   Date  06/17/19    Referring Provider  Arida      Treadmill   MPH  1.8    Grade  0.5    Minutes  15    METs  2.5      Arm Ergometer   Level  1    Watts  43    RPM  25    Minutes  15    METs  2.5      T5 Nustep   Level  1    SPM  80    Minutes  15    METs  2.5      Biostep-RELP   Level  2    SPM  50    Minutes  15    METs  2      Prescription Details   Frequency (times per week)  3    Duration  Progress to 30 minutes of continuous aerobic without signs/symptoms of physical distress      Intensity   THRR 40-80% of Max Heartrate  105-149    Ratings of Perceived Exertion  11-13    Perceived Dyspnea  0-4      Resistance Training   Training Prescription  Yes    Weight  3 lb    Reps  10-15       Perform Capillary Blood Glucose checks as needed.  Exercise Prescription Changes: Exercise Prescription Changes    Row Name 06/17/19 1400 07/17/19 1200           Response to Exercise   Blood Pressure (Admit)  132/80  118/70      Blood Pressure (Exercise)  126/64  152/74  Blood Pressure (Exit)  110/66  110/64      Heart Rate (Admit)  61 bpm  71 bpm      Heart Rate (Exercise)  84 bpm  108 bpm      Heart Rate (Exit)  58 bpm  77 bpm      Oxygen Saturation (Exercise)  98 %  -      Rating of Perceived Exertion (Exercise)  9  11      Perceived Dyspnea (Exercise)  0  -      Symptoms  none  none      Duration  -  Progress to 30 minutes of  aerobic without signs/symptoms of physical distress      Intensity  -  THRR unchanged         Progression   Progression  -  Continue to progress workloads to maintain intensity without signs/symptoms of physical distress.      Average METs  -  2.1        Resistance Training   Training Prescription  -  Yes      Weight  -  3 lb      Reps  -  10-15        Treadmill   MPH  -  1.8      Grade  -  0.5      Minutes  -  15      METs  -  2.5        Arm Ergometer   Level  -  1      RPM  -  25      Minutes  -  15      METs  -  1.9        T5 Nustep   Level  -  1      SPM  -  80      Minutes  -  15      METs  -  1.7        Biostep-RELP   Level  -  2      SPM  -  50      Minutes  -  15      METs  -  2         Exercise Comments: Exercise Comments    Row Name 06/19/19 [id number]           Exercise Comments  First full day of exercise!  Patient was oriented to gym and equipment including functions, settings, policies, and procedures.  Patient's individual exercise prescription and treatment plan were reviewed.  All starting workloads were established based on the results of the 6 minute walk test done at initial orientation visit.  The plan for exercise progression was also introduced and progression will be customized based on patient's performance and goals.          Exercise Goals and Review: Exercise Goals    Row Name 06/17/19 1426             Exercise Goals   Increase Physical Activity  Yes       Intervention  Provide advice, education, support and counseling about physical activity/exercise needs.;Develop an individualized exercise prescription for aerobic and resistive training based on initial evaluation findings, risk stratification, comorbidities and participant's personal goals.       Expected Outcomes  Short Term: Attend rehab on a regular basis to increase amount of physical activity.;Long Term: Add in home exercise  to make exercise part of routine and to increase amount of physical activity.;Long Term: Exercising regularly at least 3-5 days a week.        Increase Strength and Stamina  Yes       Intervention  Provide advice, education, support and counseling about physical activity/exercise needs.;Develop an individualized exercise prescription for aerobic and resistive training based on initial evaluation findings, risk stratification, comorbidities and participant's personal goals.       Expected Outcomes  Short Term: Increase workloads from initial exercise prescription for resistance, speed, and METs.;Short Term: Perform resistance training exercises routinely during rehab and add in resistance training at home;Long Term: Improve cardiorespiratory fitness, muscular endurance and strength as measured by increased METs and functional capacity (6MWT)       Able to understand and use rate of perceived exertion (RPE) scale  Yes       Intervention  Provide education and explanation on how to use RPE scale       Expected Outcomes  Short Term: Able to use RPE daily in rehab to express subjective intensity level;Long Term:  Able to use RPE to guide intensity level when exercising independently       Able to understand and use Dyspnea scale  Yes       Intervention  Provide education and explanation on how to use Dyspnea scale       Expected Outcomes  Short Term: Able to use Dyspnea scale daily in rehab to express subjective sense of shortness of breath during exertion;Long Term: Able to use Dyspnea scale to guide intensity level when exercising independently       Knowledge and understanding of Target Heart Rate Range (THRR)  Yes       Intervention  Provide education and explanation of THRR including how the numbers were predicted and where they are located for reference       Expected Outcomes  Short Term: Able to state/look up THRR;Short Term: Able to use daily as guideline for intensity in rehab;Long Term: Able to use THRR to govern intensity when exercising independently       Able to check pulse independently  Yes       Intervention  Provide education and  demonstration on how to check pulse in carotid and radial arteries.;Review the importance of being able to check your own pulse for safety during independent exercise       Expected Outcomes  Short Term: Able to explain why pulse checking is important during independent exercise;Long Term: Able to check pulse independently and accurately       Understanding of Exercise Prescription  Yes       Intervention  Provide education, explanation, and written materials on patient's individual exercise prescription       Expected Outcomes  Short Term: Able to explain program exercise prescription;Long Term: Able to explain home exercise prescription to exercise independently          Exercise Goals Re-Evaluation : Exercise Goals Re-Evaluation    Row Name 06/19/19 0953 07/01/19 1002 07/17/19 1300         Exercise Goal Re-Evaluation   Exercise Goals Review  Increase Physical Activity;Increase Strength and Stamina;Able to understand and use rate of perceived exertion (RPE) scale;Knowledge and understanding of Target Heart Rate Range (THRR);Able to check pulse independently;Understanding of Exercise Prescription  Increase Physical Activity;Increase Strength and Stamina;Understanding of Exercise Prescription  Increase Physical Activity;Increase Strength and Stamina;Able to understand and use rate of perceived exertion (RPE) scale;Knowledge and  understanding of Target Heart Rate Range (THRR);Understanding of Exercise Prescription     Comments  Reviewed RPE scale, THR and program prescription with pt today.  Pt voiced understanding and was given a copy of goals to take home.  Takeya is doing well in rehab.  She is still feeling tired and was worn out after her walk yesterday.  She is feeling better overall, and notices that exercise helps! Reviewed home exercise with pt today.  Pt plans to walk and use videos at home for exercise.  Reviewed THR, pulse, RPE, sign and symptoms, NTG use, and when to call 911 or MD.   Also discussed weather considerations and indoor options.  Pt voiced understanding.  Shanira needs to attend consistently to achieve better results.  She has been 5 times in September.  Staff LM today.     Expected Outcomes  Short: Use RPE daily to regulate intensity. Long: Follow program prescription in THR.  Short:  Short - attend consistently Long - improve overall stamina        Discharge Exercise Prescription (Final Exercise Prescription Changes): Exercise Prescription Changes - 07/17/19 1200      Response to Exercise   Blood Pressure (Admit)  118/70    Blood Pressure (Exercise)  152/74    Blood Pressure (Exit)  110/64    Heart Rate (Admit)  71 bpm    Heart Rate (Exercise)  108 bpm    Heart Rate (Exit)  77 bpm    Rating of Perceived Exertion (Exercise)  11    Symptoms  none    Duration  Progress to 30 minutes of  aerobic without signs/symptoms of physical distress    Intensity  THRR unchanged      Progression   Progression  Continue to progress workloads to maintain intensity without signs/symptoms of physical distress.    Average METs  2.1      Resistance Training   Training Prescription  Yes    Weight  3 lb    Reps  10-15      Treadmill   MPH  1.8    Grade  0.5    Minutes  15    METs  2.5      Arm Ergometer   Level  1    RPM  25    Minutes  15    METs  1.9      T5 Nustep   Level  1    SPM  80    Minutes  15    METs  1.7      Biostep-RELP   Level  2    SPM  50    Minutes  15    METs  2       Nutrition:  Target Goals: Understanding of nutrition guidelines, daily intake of sodium <1535m, cholesterol <206m calories 30% from fat and 7% or less from saturated fats, daily to have 5 or more servings of fruits and vegetables.  Biometrics: Pre Biometrics - 06/17/19 1415      Pre Biometrics   Height  _0  (1.626 m)    Weight  188 lb 14.4 oz (85.7 kg)    BMI (Calculated)  32.41    Single Leg Stand  26.79 seconds        Nutrition Therapy Plan and  Nutrition Goals: Nutrition Therapy & Goals - 06/17/19 1448      Nutrition Therapy   Diet  low Na, HH eating    Protein (specify units)  70g    Fiber  25 grams    Whole Grain Foods  3 servings    Saturated Fats  12 max. grams    Fruits and Vegetables  5 servings/day    Sodium  1.5 grams      Personal Nutrition Goals   Nutrition Goal  ST: limit Na to 2g LT: be able to do what she did a couple of weeks ago    Comments  Pt reports living in the [place] before here and eating fresh produce and seafood. Pt reports that she has white rice with oil and salt, sautees food with a little oil, sister is cooking more and adding in vegetables and chicken. Pt didn't give full day of intake. Discussed HH eating, low Na eating. Pt receptive. Pt doesn't normally eat processed foods and usually cooks, will sometimes grab cheese from the fridge for a snack.      Intervention Plan   Intervention  Prescribe, educate and counsel regarding individualized specific dietary modifications aiming towards targeted core components such as weight, hypertension, lipid management, diabetes, heart failure and other comorbidities.;Nutrition handout(s) given to patient.    Expected Outcomes  Short Term Goal: Understand basic principles of dietary content, such as calories, fat, sodium, cholesterol and nutrients.;Short Term Goal: A plan has been developed with personal nutrition goals set during dietitian appointment.;Long Term Goal: Adherence to prescribed nutrition plan.       Nutrition Assessments: Nutrition Assessments - 06/17/19 1518      Rate Your Plate Scores   Pre Score  35       Nutrition Goals Re-Evaluation: Nutrition Goals Re-Evaluation    [place] Name 07/08/19 1030             Goals   Nutrition Goal  ST: limit addedd sugar from juice LT: be able to do what she did before heart event       Comment  Pt reports juice is her main source of added sugar and would like to reduce this. Pt reports her  sister has been here and has been feeding her recipes her mother used to cook which may not be as HH, but she is leaving soon. Pt nervous about weight fluctuations (2#) even though she has been told she is not carrying fluid. Discussed normal weight fluctuations. Will work on Na and other things after this goal.       Expected Outcome  ST: limit addedd sugar from juice LT: be able to do what she did before heart event          Nutrition Goals Discharge (Final Nutrition Goals Re-Evaluation): Nutrition Goals Re-Evaluation - 07/08/19 1030      Goals   Nutrition Goal  ST: limit addedd sugar from juice LT: be able to do what she did before heart event    Comment  Pt reports juice is her main source of added sugar and would like to reduce this. Pt reports her sister has been here and has been feeding her recipes her mother used to cook which may not be as HH, but she is leaving soon. Pt nervous about weight fluctuations (2#) even though she has been told she is not carrying fluid. Discussed normal weight fluctuations. Will work on Na and other things after this goal.    Expected Outcome  ST: limit addedd sugar from juice LT: be able to do what she did before heart event       Psychosocial: Target Goals: Acknowledge presence or absence  of significant depression and/or stress, maximize coping skills, provide positive support system. Participant is able to verbalize types and ability to use techniques and skills needed for reducing stress and depression.   Initial Review & Psychosocial Screening: Initial Psych Review & Screening - 06/14/19 1216      Initial Review   Current issues with  Current Stress Concerns    Source of Stress Concerns  Family;Occupation    [place] lives with her son. 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 She states they are working well with her. Her sister is still in her home [place] she is  questioning returning there sometime in the future.      Family Dynamics   Good Support System?  Yes      Barriers   Psychosocial barriers to participate in program  There are no identifiable barriers or psychosocial needs.;The patient should benefit from training in stress management and relaxation.      Screening Interventions   Interventions  Encouraged to exercise;To provide support and resources with identified psychosocial needs;Provide feedback about the scores to participant    Expected Outcomes  Short Term goal: Utilizing psychosocial counselor, staff and physician to assist with identification of specific Stressors or current issues interfering with healing process. Setting desired goal for each stressor or current issue identified.;Long Term Goal: Stressors or current issues are controlled or eliminated.;Short Term goal: Identification and review with participant of any Quality of Life or Depression concerns found by scoring the questionnaire.;Long Term goal: The participant improves quality of Life and PHQ9 Scores as seen by post scores and/or verbalization of changes       Quality of Life Scores:  Quality of Life - 06/17/19 1440      Quality of Life   Select  Quality of Life      Quality of Life Scores   Health/Function Pre  14.17 %    Socioeconomic Pre  22.13 %    Psych/Spiritual Pre  20.57 %    Family Pre  30 %    GLOBAL Pre  19.37 %      Scores of 19 and below usually indicate a poorer quality of life in these areas.  A difference of  2-3 points is a clinically meaningful difference.  A difference of 2-3 points in the total score of the Quality of Life Index has been associated with significant improvement in overall quality of life, self-image, physical symptoms, and general health in studies assessing change in quality of life.  PHQ-9: Recent Review Flowsheet Data    Depression screen [facility] 2/9 06/17/2019 12/04/2015   Decreased Interest 0 3   Down, Depressed, Hopeless  0 0   PHQ - 2 Score 0 3   Altered sleeping 0 1   Tired, decreased energy 0 3   Change in appetite 0 1   Feeling bad or failure about yourself  0 3   Trouble concentrating 0 0   Moving slowly or fidgety/restless 0 1   Suicidal thoughts 0 0   PHQ-9 Score 0 12   Difficult doing work/chores Not difficult at all Not difficult at all     Interpretation of Total Score  Total Score Depression Severity:  1-4 = Minimal depression, 5-9 = Mild depression, 10-14 = Moderate depression, 15-19 = Moderately severe depression, 20-27 = Severe depression   Psychosocial Evaluation and Intervention:   Psychosocial Re-Evaluation: Psychosocial Re-Evaluation  [place] Name 07/01/19 1004             Psychosocial Re-Evaluation   Current issues with  Current Stress Concerns;Current Sleep Concerns       Comments  She is doing well mentally.  She enjoys having her sister with her to help out and brings her joy.  She has been helping with bills and caregiving for her son.  Her son is happier now and back to working again.  She is sleeping okay, most nights are good, but last night was a little rough.       Expected Outcomes  Short: Watch sleep videos on YouTube.  Long: Continue to stay positive.       Interventions  Encouraged to attend Cardiac Rehabilitation for the exercise       Continue Psychosocial Services   Follow up required by staff         Initial Review   Source of Stress Concerns  Family;Occupation          Psychosocial Discharge (Final Psychosocial Re-Evaluation): Psychosocial Re-Evaluation - 07/01/19 1004      Psychosocial Re-Evaluation   Current issues with  Current Stress Concerns;Current Sleep Concerns    Comments  She is doing well mentally.  She enjoys having her sister with her to help out and brings her joy.  She has been helping with bills and caregiving for her son.  Her son is happier now and back to working again.  She is sleeping okay, most nights are good, but last night was a  little rough.    Expected Outcomes  Short: Watch sleep videos on YouTube.  Long: Continue to stay positive.    Interventions  Encouraged to attend Cardiac Rehabilitation for the exercise    Continue Psychosocial Services   Follow up required by staff      Initial Review   Source of Stress Concerns  Family;Occupation       Vocational Rehabilitation: Provide vocational rehab assistance to qualifying candidates.   Vocational Rehab Evaluation & Intervention: Vocational Rehab - 06/14/19 1216      Initial Vocational Rehab Evaluation & Intervention   Assessment shows need for Vocational Rehabilitation  No       Education: Education Goals: Education classes will be provided on a variety of topics geared toward better understanding of heart health and risk factor modification. Participant will state understanding/return demonstration of topics presented as noted by education test scores.  Learning Barriers/Preferences: Learning Barriers/Preferences - 06/14/19 1216      Learning Barriers/Preferences   Learning Barriers  None    Learning Preferences  None       Education Topics:  AED/CPR: - Group verbal and written instruction with the use of models to demonstrate the basic use of the AED with the basic ABC's of resuscitation.   General Nutrition Guidelines/Fats and Fiber: -Group instruction provided by verbal, written material, models and posters to present the general guidelines for heart healthy nutrition. Gives an explanation and review of dietary fats and fiber.   Controlling Sodium/Reading Food Labels: -Group verbal and written material supporting the discussion of sodium use in heart healthy nutrition. Review and explanation with models, verbal and written materials for utilization of the food label.   Exercise Physiology & General Exercise Guidelines: - Group verbal and written instruction with models to review the exercise physiology of the cardiovascular system and  associated critical values. Provides general exercise guidelines with specific guidelines to those with heart or lung disease.  Aerobic Exercise & Resistance Training: - Gives group verbal and written instruction on the various components of exercise. Focuses on aerobic and resistive training programs and the benefits of this training and how to safely progress through these programs..   Flexibility, Balance, Mind/Body Relaxation: Provides group verbal/written instruction on the benefits of flexibility and balance training, including mind/body exercise modes such as yoga, pilates and tai chi.  Demonstration and skill practice provided.   Stress and Anxiety: - Provides group verbal and written instruction about the health risks of elevated stress and causes of high stress.  Discuss the correlation between heart/lung disease and anxiety and treatment options. Review healthy ways to manage with stress and anxiety.   Depression: - Provides group verbal and written instruction on the correlation between heart/lung disease and depressed mood, treatment options, and the stigmas associated with seeking treatment.   Anatomy & Physiology of the Heart: - Group verbal and written instruction and models provide basic cardiac anatomy and physiology, with the coronary electrical and arterial systems. Review of Valvular disease and Heart Failure   Cardiac Procedures: - Group verbal and written instruction to review commonly prescribed medications for heart disease. Reviews the medication, class of the drug, and side effects. Includes the steps to properly store meds and maintain the prescription regimen. (beta blockers and nitrates)   Cardiac Medications I: - Group verbal and written instruction to review commonly prescribed medications for heart disease. Reviews the medication, class of the drug, and side effects. Includes the steps to properly store meds and maintain the prescription  regimen.   Cardiac Medications II: -Group verbal and written instruction to review commonly prescribed medications for heart disease. Reviews the medication, class of the drug, and side effects. (all other drug classes)    Go Sex-Intimacy & Heart Disease, Get SMART - Goal Setting: - Group verbal and written instruction through game format to discuss heart disease and the return to sexual intimacy. Provides group verbal and written material to discuss and apply goal setting through the application of the S.M.A.R.T. Method.   Other Matters of the Heart: - Provides group verbal, written materials and models to describe Stable Angina and Peripheral Artery. Includes description of the disease process and treatment options available to the cardiac patient.   Exercise & Equipment Safety: - Individual verbal instruction and demonstration of equipment use and safety with use of the equipment.   Cardiac Rehab from 06/17/2019 in [facility] Cardiac and Pulmonary Rehab  Date  06/17/19  Educator  AS  Instruction Review Code  1- Verbalizes Understanding      Infection Prevention: - Provides verbal and written material to individual with discussion of infection control including proper hand washing and proper equipment cleaning during exercise session.   Cardiac Rehab from 06/17/2019 in [facility] Cardiac and Pulmonary Rehab  Date  06/17/19  Educator  AS  Instruction Review Code  1- Verbalizes Understanding      Falls Prevention: - Provides verbal and written material to individual with discussion of falls prevention and safety.   Cardiac Rehab from 06/17/2019 in [facility] Cardiac and Pulmonary Rehab  Date  06/17/19  Educator  AS  Instruction Review Code  1- Verbalizes Understanding      Diabetes: - Individual verbal and written instruction to review signs/symptoms of diabetes, desired ranges of glucose level fasting, after meals and with exercise. Acknowledge that pre and post exercise glucose checks will  be done for 3 sessions at entry of program.   Know Your Numbers and  Risk Factors: -Group verbal and written instruction about important numbers in your health.  Discussion of what are risk factors and how they play a role in the disease process.  Review of Cholesterol, Blood Pressure, Diabetes, and BMI and the role they play in your overall health.   Sleep Hygiene: -Provides group verbal and written instruction about how sleep can affect your health.  Define sleep hygiene, discuss sleep cycles and impact of sleep habits. Review good sleep hygiene tips.    Other: -Provides group and verbal instruction on various topics (see comments)   Knowledge Questionnaire Score: Knowledge Questionnaire Score - 06/17/19 1441      Knowledge Questionnaire Score   Pre Score  19/26       Core Components/Risk Factors/Patient Goals at Admission: Personal Goals and Risk Factors at Admission - 06/17/19 1414      Core Components/Risk Factors/Patient Goals on Admission    Weight Management  Yes;Obesity;Weight Loss    Intervention  Weight Management/Obesity: Establish reasonable short term and long term weight goals.    Admit Weight  188 lb 14.4 oz (85.7 kg)    Goal Weight: Short Term  178 lb (80.7 kg)    Goal Weight: Long Term  168 lb (76.2 kg)    Expected Outcomes  Weight Maintenance: Understanding of the daily nutrition guidelines, which includes 25-35% calories from fat, 7% or less cal from saturated fats, less than 236m cholesterol, less than 1.5gm of sodium, & 5 or more servings of fruits and vegetables daily;Short Term: Continue to assess and modify interventions until short term weight is achieved;Long Term: Adherence to nutrition and physical activity/exercise program aimed toward attainment of established weight goal    Intervention  Provide a combined exercise and nutrition program that is supplemented with education, support and counseling about heart failure. Directed toward relieving symptoms  such as shortness of breath, decreased exercise tolerance, and extremity edema.    Expected Outcomes  Improve functional capacity of life;Short term: Attendance in program 2-3 days a week with increased exercise capacity. Reported lower sodium intake. Reported increased fruit and vegetable intake. Reports medication compliance.;Short term: Daily weights obtained and reported for increase. Utilizing diuretic protocols set by physician.;Long term: Adoption of self-care skills and reduction of barriers for early signs and symptoms recognition and intervention leading to self-care maintenance.    Intervention  Provide education on lifestyle modifcations including regular physical activity/exercise, weight management, moderate sodium restriction and increased consumption of fresh fruit, vegetables, and low fat dairy, alcohol moderation, and smoking cessation.;Monitor prescription use compliance.    Expected Outcomes  Short Term: Continued assessment and intervention until BP is < 140/937mHG in hypertensive participants. < 130/8037mG in hypertensive participants with diabetes, heart failure or chronic kidney disease.;Long Term: Maintenance of blood pressure at goal levels.    Intervention  Provide education and support for participant on nutrition & aerobic/resistive exercise along with prescribed medications to achieve LDL <47m6mDL >40mg22m Expected Outcomes  Short Term: Participant states understanding of desired cholesterol values and is compliant with medications prescribed. Participant is following exercise prescription and nutrition guidelines.;Long Term: Cholesterol controlled with medications as prescribed, with individualized exercise RX and with personalized nutrition plan. Value goals: LDL < 47mg,68m > 40 mg.       Core Components/Risk Factors/Patient Goals Review:  Goals and Risk Factor Review    Row Name 07/01/19 1002             Core Components/Risk Factors/Patient Goals Review  Personal Goals Review  Weight Management/Obesity;Hypertension;Heart Failure;Lipids       Review  Wilbur is doing well in rehab.  Her pressures are improving and she is excited to hear the lower numbers.  She is doing well with her heart failure.  She notices that she still gets SOB when talking too much.   Her weight is at 190 lbs which is up a little and could be fluid in lungs.  She is trying to watch what she is eating.       Expected Outcomes  Short: Continue to work on weight loss.  Long: Continue to manage heart failure.          Core Components/Risk Factors/Patient Goals at Discharge (Final Review):  Goals and Risk Factor Review - 07/01/19 1002      Core Components/Risk Factors/Patient Goals Review   Personal Goals Review  Weight Management/Obesity;Hypertension;Heart Failure;Lipids    Review  Melainie is doing well in rehab.  Her pressures are improving and she is excited to hear the lower numbers.  She is doing well with her heart failure.  She notices that she still gets SOB when talking too much.   Her weight is at 190 lbs which is up a little and could be fluid in lungs.  She is trying to watch what she is eating.    Expected Outcomes  Short: Continue to work on weight loss.  Long: Continue to manage heart failure.       ITP Comments: ITP Comments    Row Name 06/14/19 1213 06/26/19 0648 07/03/19 1038 07/24/19 1109     ITP Comments  Virtual Initial Orientation completed. Diagnosis can be found in [facility] 8/3. EP/RD orientation scheduled for 8/31 at 1pm  30 Day review. Continue with ITP unless directed changes per Medical Director review.  New to program  Weight up today approx 4lbs from 07/01/19. Denies and trouble breathing, SOB, or swelling in extremities. Some recent med changes have been made by MD but states she is still on a diuretic. Will f/u with MD as she has appt on Friday and monitor weight and symptoms for next 2 days.  30 day review completed. ITP sent to Dr. Emily Filbert,  Medical Director of Cardiac and Pulmonary Rehab. Continue with ITP unless changes are made by physician.  Department closed starting 10/2 until further notice by infection prevention and Health at Work teams for [place].       Comments: 30 day review

## 2019-07-30 ENCOUNTER — Encounter: Payer: Self-pay | Admitting: *Deleted

## 2019-07-30 DIAGNOSIS — Z955 Presence of coronary angioplasty implant and graft: Secondary | ICD-10-CM

## 2019-07-30 DIAGNOSIS — I214 Non-ST elevation (NSTEMI) myocardial infarction: Secondary | ICD-10-CM

## 2019-07-30 NOTE — Progress Notes (Signed)
Discharge Progress Report  Patient Details  Name: Amber [name] MRN: [id number] Date of Birth: 04/07/1971 Referring Provider:     Cardiac Rehab from 06/17/2019 [facility] Cardiac and Pulmonary Rehab  Referring Provider  Arida       Number of Visits: 6  Reason for Discharge:  Early Exit:  [place] of [place]  Smoking History:  Social History   Tobacco Use  Smoking Status Never Smoker  Smokeless Tobacco Never Used    Diagnosis:  NSTEMI (non-ST elevated myocardial infarction) (HCC)  Status post coronary artery stent placement  ADL UCSD:   Initial Exercise Prescription: Initial Exercise Prescription - 06/17/19 1400      Date of Initial Exercise RX and Referring Provider   Date  06/17/19    Referring Provider  Arida      Treadmill   MPH  1.8    Grade  0.5    Minutes  15    METs  2.5      Arm Ergometer   Level  1    Watts  43    RPM  25    Minutes  15    METs  2.5      T5 Nustep   Level  1    SPM  80    Minutes  15    METs  2.5      Biostep-RELP   Level  2    SPM  50    Minutes  15    METs  2      Prescription Details   Frequency (times per week)  3    Duration  Progress to 30 minutes of continuous aerobic without signs/symptoms of physical distress      Intensity   THRR 40-80% of Max Heartrate  105-149    Ratings of Perceived Exertion  11-13    Perceived Dyspnea  0-4      Resistance Training   Training Prescription  Yes    Weight  3 lb    Reps  10-15       Discharge Exercise Prescription (Final Exercise Prescription Changes): Exercise Prescription Changes - 07/17/19 1200      Response to Exercise   Blood Pressure (Admit)  118/70    Blood Pressure (Exercise)  152/74    Blood Pressure (Exit)  110/64    Heart Rate (Admit)  71 bpm    Heart Rate (Exercise)  108 bpm    Heart Rate (Exit)  77 bpm    Rating of Perceived Exertion (Exercise)  11    Symptoms  none    Duration  Progress to 30 minutes of  aerobic without signs/symptoms of physical  distress    Intensity  THRR unchanged      Progression   Progression  Continue to progress workloads to maintain intensity without signs/symptoms of physical distress.    Average METs  2.1      Resistance Training   Training Prescription  Yes    Weight  3 lb    Reps  10-15      Treadmill   MPH  1.8    Grade  0.5    Minutes  15    METs  2.5      Arm Ergometer   Level  1    RPM  25    Minutes  15    METs  1.9      T5 Nustep   Level  1    SPM  80  Minutes  15    METs  1.7      Biostep-RELP   Level  2    SPM  50    Minutes  15    METs  2       Functional Capacity: 6 Minute Walk    Row Name 06/17/19 1413         6 Minute Walk   Phase  Initial     Distance  900 feet     Walk Time  6 minutes     # of Rest Breaks  0     MPH  1.7     METS  2.88     RPE  9     Perceived Dyspnea   0     VO2 Peak  10.08     Symptoms  No     Resting HR  61 bpm     Resting BP  132/80     Exercise Oxygen Saturation  during 6 min walk  98 %     Max Ex. HR  84 bpm     Max Ex. BP  126/64     2 Minute Post BP  110/66        Psychological, QOL, Others - Outcomes: PHQ 2/9: Depression screen [facility] 2/9 06/17/2019 12/04/2015  Decreased Interest 0 3  Down, Depressed, Hopeless 0 0  PHQ - 2 Score 0 3  Altered sleeping 0 1  Tired, decreased energy 0 3  Change in appetite 0 1  Feeling bad or failure about yourself  0 3  Trouble concentrating 0 0  Moving slowly or fidgety/restless 0 1  Suicidal thoughts 0 0  PHQ-9 Score 0 12  Difficult doing work/chores Not difficult at all Not difficult at all    Quality of Life: Quality of Life - 06/17/19 1440      Quality of Life   Select  Quality of Life      Quality of Life Scores   Health/Function Pre  14.17 %    Socioeconomic Pre  22.13 %    Psych/Spiritual Pre  20.57 %    Family Pre  30 %    GLOBAL Pre  19.37 %       Personal Goals: Goals established at orientation with interventions provided to work toward goal. Personal  Goals and Risk Factors at Admission - 06/17/19 1414      Core Components/Risk Factors/Patient Goals on Admission    Weight Management  Yes;Obesity;Weight Loss    Intervention  Weight Management/Obesity: Establish reasonable short term and long term weight goals.    Admit Weight  188 lb 14.4 oz (85.7 kg)    Goal Weight: Short Term  178 lb (80.7 kg)    Goal Weight: Long Term  168 lb (76.2 kg)    Expected Outcomes  Weight Maintenance: Understanding of the daily nutrition guidelines, which includes 25-35% calories from fat, 7% or less cal from saturated fats, less than 200mg  cholesterol, less than 1.5gm of sodium, & 5 or more servings of fruits and vegetables daily;Short Term: Continue to assess and modify interventions until short term weight is achieved;Long Term: Adherence to nutrition and physical activity/exercise program aimed toward attainment of established weight goal    Intervention  Provide a combined exercise and nutrition program that is supplemented with education, support and counseling about heart failure. Directed toward relieving symptoms such as shortness of breath, decreased exercise tolerance, and extremity edema.    Expected Outcomes  Improve functional  capacity of life;Short term: Attendance in program 2-3 days a week with increased exercise capacity. Reported lower sodium intake. Reported increased fruit and vegetable intake. Reports medication compliance.;Short term: Daily weights obtained and reported for increase. Utilizing diuretic protocols set by physician.;Long term: Adoption of self-care skills and reduction of barriers for early signs and symptoms recognition and intervention leading to self-care maintenance.    Intervention  Provide education on lifestyle modifcations including regular physical activity/exercise, weight management, moderate sodium restriction and increased consumption of fresh fruit, vegetables, and low fat dairy, alcohol moderation, and smoking  cessation.;Monitor prescription use compliance.    Expected Outcomes  Short Term: Continued assessment and intervention until BP is < 140/8mm HG in hypertensive participants. < 130/1mm HG in hypertensive participants with diabetes, heart failure or chronic kidney disease.;Long Term: Maintenance of blood pressure at goal levels.    Intervention  Provide education and support for participant on nutrition & aerobic/resistive exercise along with prescribed medications to achieve LDL 70mg , HDL >40mg .    Expected Outcomes  Short Term: Participant states understanding of desired cholesterol values and is compliant with medications prescribed. Participant is following exercise prescription and nutrition guidelines.;Long Term: Cholesterol controlled with medications as prescribed, with individualized exercise RX and with personalized nutrition plan. Value goals: LDL < 70mg , HDL > 40 mg.        Personal Goals Discharge: Goals and Risk Factor Review    Row Name 07/01/19 1002             Core Components/Risk Factors/Patient Goals Review   Personal Goals Review  Weight Management/Obesity;Hypertension;Heart Failure;Lipids       Review  Amber [name] is doing well in rehab.  Her pressures are improving and she is excited to hear the lower numbers.  She is doing well with her heart failure.  She notices that she still gets SOB when talking too much.   Her weight is at 190 lbs which is up a little and could be fluid in lungs.  She is trying to watch what she is eating.       Expected Outcomes  Short: Continue to work on weight loss.  Long: Continue to manage heart failure.          Exercise Goals and Review: Exercise Goals    Row Name 06/17/19 1426             Exercise Goals   Increase Physical Activity  Yes       Intervention  Provide advice, education, support and counseling about physical activity/exercise needs.;Develop an individualized exercise prescription for aerobic and resistive training based  on initial evaluation findings, risk stratification, comorbidities and participant's personal goals.       Expected Outcomes  Short Term: Attend rehab on a regular basis to increase amount of physical activity.;Long Term: Add in home exercise to make exercise part of routine and to increase amount of physical activity.;Long Term: Exercising regularly at least 3-5 days a week.       Increase Strength and Stamina  Yes       Intervention  Provide advice, education, support and counseling about physical activity/exercise needs.;Develop an individualized exercise prescription for aerobic and resistive training based on initial evaluation findings, risk stratification, comorbidities and participant's personal goals.       Expected Outcomes  Short Term: Increase workloads from initial exercise prescription for resistance, speed, and METs.;Short Term: Perform resistance training exercises routinely during rehab and add in resistance training at home;Long Term: Improve cardiorespiratory fitness, muscular  endurance and strength as measured by increased METs and functional capacity (6MWT)       Able to understand and use rate of perceived exertion (RPE) scale  Yes       Intervention  Provide education and explanation on how to use RPE scale       Expected Outcomes  Short Term: Able to use RPE daily in rehab to express subjective intensity level;Long Term:  Able to use RPE to guide intensity level when exercising independently       Able to understand and use Dyspnea scale  Yes       Intervention  Provide education and explanation on how to use Dyspnea scale       Expected Outcomes  Short Term: Able to use Dyspnea scale daily in rehab to express subjective sense of shortness of breath during exertion;Long Term: Able to use Dyspnea scale to guide intensity level when exercising independently       Knowledge and understanding of Target Heart Rate Range (THRR)  Yes       Intervention  Provide education and explanation  of THRR including how the numbers were predicted and where they are located for reference       Expected Outcomes  Short Term: Able to state/look up THRR;Short Term: Able to use daily as guideline for intensity in rehab;Long Term: Able to use THRR to govern intensity when exercising independently       Able to check pulse independently  Yes       Intervention  Provide education and demonstration on how to check pulse in carotid and radial arteries.;Review the importance of being able to check your own pulse for safety during independent exercise       Expected Outcomes  Short Term: Able to explain why pulse checking is important during independent exercise;Long Term: Able to check pulse independently and accurately       Understanding of Exercise Prescription  Yes       Intervention  Provide education, explanation, and written materials on patient's individual exercise prescription       Expected Outcomes  Short Term: Able to explain program exercise prescription;Long Term: Able to explain home exercise prescription to exercise independently          Exercise Goals Re-Evaluation: Exercise Goals Re-Evaluation    Row Name 06/19/19 0953 07/01/19 1002 07/17/19 1300         Exercise Goal Re-Evaluation   Exercise Goals Review  Increase Physical Activity;Increase Strength and Stamina;Able to understand and use rate of perceived exertion (RPE) scale;Knowledge and understanding of Target Heart Rate Range (THRR);Able to check pulse independently;Understanding of Exercise Prescription  Increase Physical Activity;Increase Strength and Stamina;Understanding of Exercise Prescription  Increase Physical Activity;Increase Strength and Stamina;Able to understand and use rate of perceived exertion (RPE) scale;Knowledge and understanding of Target Heart Rate Range (THRR);Understanding of Exercise Prescription     Comments  Reviewed RPE scale, THR and program prescription with pt today.  Pt voiced understanding and  was given a copy of goals to take home.  Amber [name] is doing well in rehab.  She is still feeling tired and was worn out after her walk yesterday.  She is feeling better overall, and notices that exercise helps! Reviewed home exercise with pt today.  Pt plans to walk and use videos at home for exercise.  Reviewed THR, pulse, RPE, sign and symptoms, NTG use, and when to call 911 or MD.  Also discussed weather considerations and indoor options.  Pt voiced understanding.  Amber [name] needs to attend consistently to achieve better results.  She has been 5 times in September.  Staff LM today.     Expected Outcomes  Short: Use RPE daily to regulate intensity. Long: Follow program prescription in THR.  Short:  Short - attend consistently Long - improve overall stamina        Nutrition & Weight - Outcomes: Pre Biometrics - 06/17/19 1415      Pre Biometrics   Height  5\' 4"  (1.626 m)    Weight  188 lb 14.4 oz (85.7 kg)    BMI (Calculated)  32.41    Single Leg Stand  26.79 seconds        Nutrition: Nutrition Therapy & Goals - 06/17/19 1448      Nutrition Therapy   Diet  low Na, HH eating    Protein (specify units)  70g    Fiber  25 grams    Whole Grain Foods  3 servings    Saturated Fats  12 max. grams    Fruits and Vegetables  5 servings/day    Sodium  1.5 grams      Personal Nutrition Goals   Nutrition Goal  ST: limit Na to 2g LT: be able to do what she did a couple of weeks ago    Comments  Pt reports living in the 06/19/19 before here and eating fresh produce and seafood. Pt reports that she has white rice with oil and salt, sautees food with a little oil, sister is cooking more and adding in vegetables and chicken. Pt didn't give full day of intake. Discussed HH eating, low Na eating. Pt receptive. Pt doesn't normally eat processed foods and usually cooks, will sometimes grab cheese from the fridge for a snack.      Intervention Plan   Intervention  Prescribe, educate and counsel  regarding individualized specific dietary modifications aiming towards targeted core components such as weight, hypertension, lipid management, diabetes, heart failure and other comorbidities.;Nutrition handout(s) given to patient.    Expected Outcomes  Short Term Goal: Understand basic principles of dietary content, such as calories, fat, sodium, cholesterol and nutrients.;Short Term Goal: A plan has been developed with personal nutrition goals set during dietitian appointment.;Long Term Goal: Adherence to prescribed nutrition plan.       Nutrition Discharge: Nutrition Assessments - 06/17/19 1518      Rate Your Plate Scores   Pre Score  35       Education Questionnaire Score: Knowledge Questionnaire Score - 06/17/19 1441      Knowledge Questionnaire Score   Pre Score  19/26       Goals reviewed with patient; copy given to patient.

## 2019-07-30 NOTE — Progress Notes (Signed)
Cardiac Individual Treatment Plan  Patient Details  Name: Amber [name] MRN: [id number] Date of Birth: October 16, 1971 Referring Provider:     Cardiac Rehab from 06/17/2019 in [facility] Cardiac and Pulmonary Rehab  Referring Provider  Arida      Initial Encounter Date:    Cardiac Rehab from 06/17/2019 in [facility] Cardiac and Pulmonary Rehab  Date  06/17/19      Visit Diagnosis: NSTEMI (non-ST elevated myocardial infarction) [facility])  Status post coronary artery stent placement  Patient's Home Medications on Admission:  Current Outpatient Medications:  .  aspirin 81 MG chewable tablet, Chew 1 tablet (81 mg total) by mouth daily., Disp: 30 tablet, Rfl: 11 .  atorvastatin (LIPITOR) 80 MG tablet, Take 1 tablet (80 mg total) by mouth daily at 6 PM., Disp: 30 tablet, Rfl: 11 .  calcium-vitamin D 250-100 MG-UNIT tablet, Take 1 tablet by mouth 2 (two) times daily., Disp: , Rfl:  .  ferrous sulfate 325 (65 FE) MG tablet, Take 325 mg by mouth every morning. , Disp: , Rfl:  .  furosemide (LASIX) 20 MG tablet, Take 1 tablet (20 mg total) by mouth daily., Disp: 90 tablet, Rfl: 3 .  methimazole (TAPAZOLE) 10 MG tablet, Take 1 tablet (10 mg total) by mouth 2 (two) times daily., Disp: 60 tablet, Rfl: 6 .  metoprolol succinate (TOPROL XL) 25 MG 24 hr tablet, Take 1 tablet (25 mg total) by mouth daily., Disp: 90 tablet, Rfl: 0 .  nitroGLYCERIN (NITROSTAT) 0.4 MG SL tablet, Place 1 tablet (0.4 mg total) under the tongue every 5 (five) minutes as needed for chest pain., Disp: 100 tablet, Rfl: 3 .  potassium chloride SA (K-DUR) 20 MEQ tablet, Take 1 tablet (20 mEq total) by mouth daily., Disp: 90 tablet, Rfl: 3 .  sacubitril-valsartan (ENTRESTO) 49-51 MG, Take 1 tablet by mouth 2 (two) times daily., Disp: 60 tablet, Rfl: 6 .  ticagrelor (BRILINTA) 90 MG TABS tablet, Take 1 tablet (90 mg total) by mouth 2 (two) times daily., Disp: 60 tablet, Rfl: 11  Past Medical History: Past Medical History:  Diagnosis Date  . Acute  encephalopathy   . Anemia   . CAD (coronary artery disease)   . HFrEF (heart failure with reduced ejection fraction) ([place])   . HTN (hypertension)   . Hyperthyroidism   . 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 Thyroid storm     Tobacco Use: Social History   Tobacco Use  Smoking Status Never Smoker  Smokeless Tobacco Never Used    Labs: Recent Review Flowsheet Data    Labs for ITP Cardiac and Pulmonary Rehab Latest Ref Rng & Units 05/21/2019 05/21/2019 05/21/2019 05/22/2019 05/22/2019   Cholestrol 0 - 200 mg/dL - - - - -   LDLCALC 0 - 99 mg/dL - - - - -   HDL >39.00 mg/dL - - - - -   Trlycerides <150 mg/dL - - 69 - -   Hemoglobin A1c 4.6 - 6.5 % - - - - -   PHART 7.350 - 7.450 7.290(L) 7.288(L) - 7.484(H) -   PCO2ART 32.0 - 48.0 mmHg 43.2 39.3 - 34.5 -   HCO3 20.0 - 28.0 mmol/L 20.7 18.8(L) - 26.0 22.5   TCO2 22 - 32 mmol/L 22 20(L) - 27 24   ACIDBASEDEF 0.0 - 2.0 mmol/L 6.0(H) 7.0(H) - - 2.0   O2SAT % 67.0 90.0 - 100.0 79.0       Exercise Target Goals: Exercise Program Goal: Individual exercise prescription set using results from  initial 6 min walk test and THRR while considering  patient's activity barriers and safety.   Exercise Prescription Goal: Initial exercise prescription builds to 30-45 minutes a day of aerobic activity, 2-3 days per week.  Home exercise guidelines will be given to patient during program as part of exercise prescription that the participant will acknowledge.  Activity Barriers & Risk Stratification: Activity Barriers & Cardiac Risk Stratification - 06/14/19 1218      Activity Barriers & Cardiac Risk Stratification   Activity Barriers  None    Cardiac Risk Stratification  Moderate       6 Minute Walk: 6 Minute Walk    Row Name 06/17/19 1413         6 Minute Walk   Phase  Initial     Distance  900 feet     Walk Time  6 minutes     # of Rest Breaks  0     MPH  1.7     METS  2.88     RPE  9     Perceived Dyspnea   0     VO2 Peak  10.08      Symptoms  No     Resting HR  61 bpm     Resting BP  132/80     Exercise Oxygen Saturation  during 6 min walk  98 %     Max Ex. HR  84 bpm     Max Ex. BP  126/64     2 Minute Post BP  110/66        Oxygen Initial Assessment:   Oxygen Re-Evaluation:   Oxygen Discharge (Final Oxygen Re-Evaluation):   Initial Exercise Prescription: Initial Exercise Prescription - 06/17/19 1400      Date of Initial Exercise RX and Referring Provider   Date  06/17/19    Referring Provider  Arida      Treadmill   MPH  1.8    Grade  0.5    Minutes  15    METs  2.5      Arm Ergometer   Level  1    Watts  43    RPM  25    Minutes  15    METs  2.5      T5 Nustep   Level  1    SPM  80    Minutes  15    METs  2.5      Biostep-RELP   Level  2    SPM  50    Minutes  15    METs  2      Prescription Details   Frequency (times per week)  3    Duration  Progress to 30 minutes of continuous aerobic without signs/symptoms of physical distress      Intensity   THRR 40-80% of Max Heartrate  105-149    Ratings of Perceived Exertion  11-13    Perceived Dyspnea  0-4      Resistance Training   Training Prescription  Yes    Weight  3 lb    Reps  10-15       Perform Capillary Blood Glucose checks as needed.  Exercise Prescription Changes: Exercise Prescription Changes    Row Name 06/17/19 1400 07/17/19 1200           Response to Exercise   Blood Pressure (Admit)  132/80  118/70      Blood Pressure (Exercise)  126/64  152/74  Blood Pressure (Exit)  110/66  110/64      Heart Rate (Admit)  61 bpm  71 bpm      Heart Rate (Exercise)  84 bpm  108 bpm      Heart Rate (Exit)  58 bpm  77 bpm      Oxygen Saturation (Exercise)  98 %  -      Rating of Perceived Exertion (Exercise)  9  11      Perceived Dyspnea (Exercise)  0  -      Symptoms  none  none      Duration  -  Progress to 30 minutes of  aerobic without signs/symptoms of physical distress      Intensity  -  THRR unchanged         Progression   Progression  -  Continue to progress workloads to maintain intensity without signs/symptoms of physical distress.      Average METs  -  2.1        Resistance Training   Training Prescription  -  Yes      Weight  -  3 lb      Reps  -  10-15        Treadmill   MPH  -  1.8      Grade  -  0.5      Minutes  -  15      METs  -  2.5        Arm Ergometer   Level  -  1      RPM  -  25      Minutes  -  15      METs  -  1.9        T5 Nustep   Level  -  1      SPM  -  80      Minutes  -  15      METs  -  1.7        Biostep-RELP   Level  -  2      SPM  -  50      Minutes  -  15      METs  -  2         Exercise Comments: Exercise Comments    Row Name 06/19/19 [id number]           Exercise Comments  First full day of exercise!  Patient was oriented to gym and equipment including functions, settings, policies, and procedures.  Patient's individual exercise prescription and treatment plan were reviewed.  All starting workloads were established based on the results of the 6 minute walk test done at initial orientation visit.  The plan for exercise progression was also introduced and progression will be customized based on patient's performance and goals.          Exercise Goals and Review: Exercise Goals    Row Name 06/17/19 1426             Exercise Goals   Increase Physical Activity  Yes       Intervention  Provide advice, education, support and counseling about physical activity/exercise needs.;Develop an individualized exercise prescription for aerobic and resistive training based on initial evaluation findings, risk stratification, comorbidities and participant's personal goals.       Expected Outcomes  Short Term: Attend rehab on a regular basis to increase amount of physical activity.;Long Term: Add in home exercise  to make exercise part of routine and to increase amount of physical activity.;Long Term: Exercising regularly at least 3-5 days a week.        Increase Strength and Stamina  Yes       Intervention  Provide advice, education, support and counseling about physical activity/exercise needs.;Develop an individualized exercise prescription for aerobic and resistive training based on initial evaluation findings, risk stratification, comorbidities and participant's personal goals.       Expected Outcomes  Short Term: Increase workloads from initial exercise prescription for resistance, speed, and METs.;Short Term: Perform resistance training exercises routinely during rehab and add in resistance training at home;Long Term: Improve cardiorespiratory fitness, muscular endurance and strength as measured by increased METs and functional capacity (6MWT)       Able to understand and use rate of perceived exertion (RPE) scale  Yes       Intervention  Provide education and explanation on how to use RPE scale       Expected Outcomes  Short Term: Able to use RPE daily in rehab to express subjective intensity level;Long Term:  Able to use RPE to guide intensity level when exercising independently       Able to understand and use Dyspnea scale  Yes       Intervention  Provide education and explanation on how to use Dyspnea scale       Expected Outcomes  Short Term: Able to use Dyspnea scale daily in rehab to express subjective sense of shortness of breath during exertion;Long Term: Able to use Dyspnea scale to guide intensity level when exercising independently       Knowledge and understanding of Target Heart Rate Range (THRR)  Yes       Intervention  Provide education and explanation of THRR including how the numbers were predicted and where they are located for reference       Expected Outcomes  Short Term: Able to state/look up THRR;Short Term: Able to use daily as guideline for intensity in rehab;Long Term: Able to use THRR to govern intensity when exercising independently       Able to check pulse independently  Yes       Intervention  Provide education and  demonstration on how to check pulse in carotid and radial arteries.;Review the importance of being able to check your own pulse for safety during independent exercise       Expected Outcomes  Short Term: Able to explain why pulse checking is important during independent exercise;Long Term: Able to check pulse independently and accurately       Understanding of Exercise Prescription  Yes       Intervention  Provide education, explanation, and written materials on patient's individual exercise prescription       Expected Outcomes  Short Term: Able to explain program exercise prescription;Long Term: Able to explain home exercise prescription to exercise independently          Exercise Goals Re-Evaluation : Exercise Goals Re-Evaluation    Row Name 06/19/19 0953 07/01/19 1002 07/17/19 1300         Exercise Goal Re-Evaluation   Exercise Goals Review  Increase Physical Activity;Increase Strength and Stamina;Able to understand and use rate of perceived exertion (RPE) scale;Knowledge and understanding of Target Heart Rate Range (THRR);Able to check pulse independently;Understanding of Exercise Prescription  Increase Physical Activity;Increase Strength and Stamina;Understanding of Exercise Prescription  Increase Physical Activity;Increase Strength and Stamina;Able to understand and use rate of perceived exertion (RPE) scale;Knowledge and  understanding of Target Heart Rate Range (THRR);Understanding of Exercise Prescription     Comments  Reviewed RPE scale, THR and program prescription with pt today.  Pt voiced understanding and was given a copy of goals to take home.  Amber [name] is doing well in rehab.  She is still feeling tired and was worn out after her walk yesterday.  She is feeling better overall, and notices that exercise helps! Reviewed home exercise with pt today.  Pt plans to walk and use videos at home for exercise.  Reviewed THR, pulse, RPE, sign and symptoms, NTG use, and when to call 911 or MD.   Also discussed weather considerations and indoor options.  Pt voiced understanding.  Amber [name] needs to attend consistently to achieve better results.  She has been 5 times in September.  Staff LM today.     Expected Outcomes  Short: Use RPE daily to regulate intensity. Long: Follow program prescription in THR.  Short:  Short - attend consistently Long - improve overall stamina        Discharge Exercise Prescription (Final Exercise Prescription Changes): Exercise Prescription Changes - 07/17/19 1200      Response to Exercise   Blood Pressure (Admit)  118/70    Blood Pressure (Exercise)  152/74    Blood Pressure (Exit)  110/64    Heart Rate (Admit)  71 bpm    Heart Rate (Exercise)  108 bpm    Heart Rate (Exit)  77 bpm    Rating of Perceived Exertion (Exercise)  11    Symptoms  none    Duration  Progress to 30 minutes of  aerobic without signs/symptoms of physical distress    Intensity  THRR unchanged      Progression   Progression  Continue to progress workloads to maintain intensity without signs/symptoms of physical distress.    Average METs  2.1      Resistance Training   Training Prescription  Yes    Weight  3 lb    Reps  10-15      Treadmill   MPH  1.8    Grade  0.5    Minutes  15    METs  2.5      Arm Ergometer   Level  1    RPM  25    Minutes  15    METs  1.9      T5 Nustep   Level  1    SPM  80    Minutes  15    METs  1.7      Biostep-RELP   Level  2    SPM  50    Minutes  15    METs  2       Nutrition:  Target Goals: Understanding of nutrition guidelines, daily intake of sodium <1535m, cholesterol <206m calories 30% from fat and 7% or less from saturated fats, daily to have 5 or more servings of fruits and vegetables.  Biometrics: Pre Biometrics - 06/17/19 1415      Pre Biometrics   Height  _0  (1.626 m)    Weight  188 lb 14.4 oz (85.7 kg)    BMI (Calculated)  32.41    Single Leg Stand  26.79 seconds        Nutrition Therapy Plan and  Nutrition Goals: Nutrition Therapy & Goals - 06/17/19 1448      Nutrition Therapy   Diet  low Na, HH eating    Protein (specify units)  70g    Fiber  25 grams    Whole Grain Foods  3 servings    Saturated Fats  12 max. grams    Fruits and Vegetables  5 servings/day    Sodium  1.5 grams      Personal Nutrition Goals   Nutrition Goal  ST: limit Na to 2g LT: be able to do what she did a couple of weeks ago    Comments  Pt reports living in the [place] before here and eating fresh produce and seafood. Pt reports that she has white rice with oil and salt, sautees food with a little oil, sister is cooking more and adding in vegetables and chicken. Pt didn't give full day of intake. Discussed HH eating, low Na eating. Pt receptive. Pt doesn't normally eat processed foods and usually cooks, will sometimes grab cheese from the fridge for a snack.      Intervention Plan   Intervention  Prescribe, educate and counsel regarding individualized specific dietary modifications aiming towards targeted core components such as weight, hypertension, lipid management, diabetes, heart failure and other comorbidities.;Nutrition handout(s) given to patient.    Expected Outcomes  Short Term Goal: Understand basic principles of dietary content, such as calories, fat, sodium, cholesterol and nutrients.;Short Term Goal: A plan has been developed with personal nutrition goals set during dietitian appointment.;Long Term Goal: Adherence to prescribed nutrition plan.       Nutrition Assessments: Nutrition Assessments - 06/17/19 1518      Rate Your Plate Scores   Pre Score  35       Nutrition Goals Re-Evaluation: Nutrition Goals Re-Evaluation    [place] Name 07/08/19 1030             Goals   Nutrition Goal  ST: limit addedd sugar from juice LT: be able to do what she did before heart event       Comment  Pt reports juice is her main source of added sugar and would like to reduce this. Pt reports her  sister has been here and has been feeding her recipes her mother used to cook which may not be as HH, but she is leaving soon. Pt nervous about weight fluctuations (2#) even though she has been told she is not carrying fluid. Discussed normal weight fluctuations. Will work on Na and other things after this goal.       Expected Outcome  ST: limit addedd sugar from juice LT: be able to do what she did before heart event          Nutrition Goals Discharge (Final Nutrition Goals Re-Evaluation): Nutrition Goals Re-Evaluation - 07/08/19 1030      Goals   Nutrition Goal  ST: limit addedd sugar from juice LT: be able to do what she did before heart event    Comment  Pt reports juice is her main source of added sugar and would like to reduce this. Pt reports her sister has been here and has been feeding her recipes her mother used to cook which may not be as HH, but she is leaving soon. Pt nervous about weight fluctuations (2#) even though she has been told she is not carrying fluid. Discussed normal weight fluctuations. Will work on Na and other things after this goal.    Expected Outcome  ST: limit addedd sugar from juice LT: be able to do what she did before heart event       Psychosocial: Target Goals: Acknowledge presence or absence  of significant depression and/or stress, maximize coping skills, provide positive support system. Participant is able to verbalize types and ability to use techniques and skills needed for reducing stress and depression.   Initial Review & Psychosocial Screening: Initial Psych Review & Screening - 06/14/19 1216      Initial Review   Current issues with  Current Stress Concerns    Source of Stress Concerns  Family;Occupation    [place] lives with her son. 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 She states they are working well with her. Her sister is still in her home [place] she is  questioning returning there sometime in the future.      Family Dynamics   Good Support System?  Yes      Barriers   Psychosocial barriers to participate in program  There are no identifiable barriers or psychosocial needs.;The patient should benefit from training in stress management and relaxation.      Screening Interventions   Interventions  Encouraged to exercise;To provide support and resources with identified psychosocial needs;Provide feedback about the scores to participant    Expected Outcomes  Short Term goal: Utilizing psychosocial counselor, staff and physician to assist with identification of specific Stressors or current issues interfering with healing process. Setting desired goal for each stressor or current issue identified.;Long Term Goal: Stressors or current issues are controlled or eliminated.;Short Term goal: Identification and review with participant of any Quality of Life or Depression concerns found by scoring the questionnaire.;Long Term goal: The participant improves quality of Life and PHQ9 Scores as seen by post scores and/or verbalization of changes       Quality of Life Scores:  Quality of Life - 06/17/19 1440      Quality of Life   Select  Quality of Life      Quality of Life Scores   Health/Function Pre  14.17 %    Socioeconomic Pre  22.13 %    Psych/Spiritual Pre  20.57 %    Family Pre  30 %    GLOBAL Pre  19.37 %      Scores of 19 and below usually indicate a poorer quality of life in these areas.  A difference of  2-3 points is a clinically meaningful difference.  A difference of 2-3 points in the total score of the Quality of Life Index has been associated with significant improvement in overall quality of life, self-image, physical symptoms, and general health in studies assessing change in quality of life.  PHQ-9: Recent Review Flowsheet Data    Depression screen [facility] 2/9 06/17/2019 12/04/2015   Decreased Interest 0 3   Down, Depressed, Hopeless  0 0   PHQ - 2 Score 0 3   Altered sleeping 0 1   Tired, decreased energy 0 3   Change in appetite 0 1   Feeling bad or failure about yourself  0 3   Trouble concentrating 0 0   Moving slowly or fidgety/restless 0 1   Suicidal thoughts 0 0   PHQ-9 Score 0 12   Difficult doing work/chores Not difficult at all Not difficult at all     Interpretation of Total Score  Total Score Depression Severity:  1-4 = Minimal depression, 5-9 = Mild depression, 10-14 = Moderate depression, 15-19 = Moderately severe depression, 20-27 = Severe depression   Psychosocial Evaluation and Intervention:   Psychosocial Re-Evaluation: Psychosocial Re-Evaluation  [place] Name 07/01/19 1004             Psychosocial Re-Evaluation   Current issues with  Current Stress Concerns;Current Sleep Concerns       Comments  She is doing well mentally.  She enjoys having her sister with her to help out and brings her joy.  She has been helping with bills and caregiving for her son.  Her son is happier now and back to working again.  She is sleeping okay, most nights are good, but last night was a little rough.       Expected Outcomes  Short: Watch sleep videos on YouTube.  Long: Continue to stay positive.       Interventions  Encouraged to attend Cardiac Rehabilitation for the exercise       Continue Psychosocial Services   Follow up required by staff         Initial Review   Source of Stress Concerns  Family;Occupation          Psychosocial Discharge (Final Psychosocial Re-Evaluation): Psychosocial Re-Evaluation - 07/01/19 1004      Psychosocial Re-Evaluation   Current issues with  Current Stress Concerns;Current Sleep Concerns    Comments  She is doing well mentally.  She enjoys having her sister with her to help out and brings her joy.  She has been helping with bills and caregiving for her son.  Her son is happier now and back to working again.  She is sleeping okay, most nights are good, but last night was a  little rough.    Expected Outcomes  Short: Watch sleep videos on YouTube.  Long: Continue to stay positive.    Interventions  Encouraged to attend Cardiac Rehabilitation for the exercise    Continue Psychosocial Services   Follow up required by staff      Initial Review   Source of Stress Concerns  Family;Occupation       Vocational Rehabilitation: Provide vocational rehab assistance to qualifying candidates.   Vocational Rehab Evaluation & Intervention: Vocational Rehab - 06/14/19 1216      Initial Vocational Rehab Evaluation & Intervention   Assessment shows need for Vocational Rehabilitation  No       Education: Education Goals: Education classes will be provided on a variety of topics geared toward better understanding of heart health and risk factor modification. Participant will state understanding/return demonstration of topics presented as noted by education test scores.  Learning Barriers/Preferences: Learning Barriers/Preferences - 06/14/19 1216      Learning Barriers/Preferences   Learning Barriers  None    Learning Preferences  None       Education Topics:  AED/CPR: - Group verbal and written instruction with the use of models to demonstrate the basic use of the AED with the basic ABC's of resuscitation.   General Nutrition Guidelines/Fats and Fiber: -Group instruction provided by verbal, written material, models and posters to present the general guidelines for heart healthy nutrition. Gives an explanation and review of dietary fats and fiber.   Controlling Sodium/Reading Food Labels: -Group verbal and written material supporting the discussion of sodium use in heart healthy nutrition. Review and explanation with models, verbal and written materials for utilization of the food label.   Exercise Physiology & General Exercise Guidelines: - Group verbal and written instruction with models to review the exercise physiology of the cardiovascular system and  associated critical values. Provides general exercise guidelines with specific guidelines to those with heart or lung disease.  Aerobic Exercise & Resistance Training: - Gives group verbal and written instruction on the various components of exercise. Focuses on aerobic and resistive training programs and the benefits of this training and how to safely progress through these programs..   Flexibility, Balance, Mind/Body Relaxation: Provides group verbal/written instruction on the benefits of flexibility and balance training, including mind/body exercise modes such as yoga, pilates and tai chi.  Demonstration and skill practice provided.   Stress and Anxiety: - Provides group verbal and written instruction about the health risks of elevated stress and causes of high stress.  Discuss the correlation between heart/lung disease and anxiety and treatment options. Review healthy ways to manage with stress and anxiety.   Depression: - Provides group verbal and written instruction on the correlation between heart/lung disease and depressed mood, treatment options, and the stigmas associated with seeking treatment.   Anatomy & Physiology of the Heart: - Group verbal and written instruction and models provide basic cardiac anatomy and physiology, with the coronary electrical and arterial systems. Review of Valvular disease and Heart Failure   Cardiac Procedures: - Group verbal and written instruction to review commonly prescribed medications for heart disease. Reviews the medication, class of the drug, and side effects. Includes the steps to properly store meds and maintain the prescription regimen. (beta blockers and nitrates)   Cardiac Medications I: - Group verbal and written instruction to review commonly prescribed medications for heart disease. Reviews the medication, class of the drug, and side effects. Includes the steps to properly store meds and maintain the prescription  regimen.   Cardiac Medications II: -Group verbal and written instruction to review commonly prescribed medications for heart disease. Reviews the medication, class of the drug, and side effects. (all other drug classes)    Go Sex-Intimacy & Heart Disease, Get SMART - Goal Setting: - Group verbal and written instruction through game format to discuss heart disease and the return to sexual intimacy. Provides group verbal and written material to discuss and apply goal setting through the application of the S.M.A.R.T. Method.   Other Matters of the Heart: - Provides group verbal, written materials and models to describe Stable Angina and Peripheral Artery. Includes description of the disease process and treatment options available to the cardiac patient.   Exercise & Equipment Safety: - Individual verbal instruction and demonstration of equipment use and safety with use of the equipment.   Cardiac Rehab from 06/17/2019 in [facility] Cardiac and Pulmonary Rehab  Date  06/17/19  Educator  AS  Instruction Review Code  1- Verbalizes Understanding      Infection Prevention: - Provides verbal and written material to individual with discussion of infection control including proper hand washing and proper equipment cleaning during exercise session.   Cardiac Rehab from 06/17/2019 in [facility] Cardiac and Pulmonary Rehab  Date  06/17/19  Educator  AS  Instruction Review Code  1- Verbalizes Understanding      Falls Prevention: - Provides verbal and written material to individual with discussion of falls prevention and safety.   Cardiac Rehab from 06/17/2019 in [facility] Cardiac and Pulmonary Rehab  Date  06/17/19  Educator  AS  Instruction Review Code  1- Verbalizes Understanding      Diabetes: - Individual verbal and written instruction to review signs/symptoms of diabetes, desired ranges of glucose level fasting, after meals and with exercise. Acknowledge that pre and post exercise glucose checks will  be done for 3 sessions at entry of program.   Know Your Numbers and  Risk Factors: -Group verbal and written instruction about important numbers in your health.  Discussion of what are risk factors and how they play a role in the disease process.  Review of Cholesterol, Blood Pressure, Diabetes, and BMI and the role they play in your overall health.   Sleep Hygiene: -Provides group verbal and written instruction about how sleep can affect your health.  Define sleep hygiene, discuss sleep cycles and impact of sleep habits. Review good sleep hygiene tips.    Other: -Provides group and verbal instruction on various topics (see comments)   Knowledge Questionnaire Score: Knowledge Questionnaire Score - 06/17/19 1441      Knowledge Questionnaire Score   Pre Score  19/26       Core Components/Risk Factors/Patient Goals at Admission: Personal Goals and Risk Factors at Admission - 06/17/19 1414      Core Components/Risk Factors/Patient Goals on Admission    Weight Management  Yes;Obesity;Weight Loss    Intervention  Weight Management/Obesity: Establish reasonable short term and long term weight goals.    Admit Weight  188 lb 14.4 oz (85.7 kg)    Goal Weight: Short Term  178 lb (80.7 kg)    Goal Weight: Long Term  168 lb (76.2 kg)    Expected Outcomes  Weight Maintenance: Understanding of the daily nutrition guidelines, which includes 25-35% calories from fat, 7% or less cal from saturated fats, less than 236m cholesterol, less than 1.5gm of sodium, & 5 or more servings of fruits and vegetables daily;Short Term: Continue to assess and modify interventions until short term weight is achieved;Long Term: Adherence to nutrition and physical activity/exercise program aimed toward attainment of established weight goal    Intervention  Provide a combined exercise and nutrition program that is supplemented with education, support and counseling about heart failure. Directed toward relieving symptoms  such as shortness of breath, decreased exercise tolerance, and extremity edema.    Expected Outcomes  Improve functional capacity of life;Short term: Attendance in program 2-3 days a week with increased exercise capacity. Reported lower sodium intake. Reported increased fruit and vegetable intake. Reports medication compliance.;Short term: Daily weights obtained and reported for increase. Utilizing diuretic protocols set by physician.;Long term: Adoption of self-care skills and reduction of barriers for early signs and symptoms recognition and intervention leading to self-care maintenance.    Intervention  Provide education on lifestyle modifcations including regular physical activity/exercise, weight management, moderate sodium restriction and increased consumption of fresh fruit, vegetables, and low fat dairy, alcohol moderation, and smoking cessation.;Monitor prescription use compliance.    Expected Outcomes  Short Term: Continued assessment and intervention until BP is < 140/937mHG in hypertensive participants. < 130/8037mG in hypertensive participants with diabetes, heart failure or chronic kidney disease.;Long Term: Maintenance of blood pressure at goal levels.    Intervention  Provide education and support for participant on nutrition & aerobic/resistive exercise along with prescribed medications to achieve LDL <47m6mDL >40mg22m Expected Outcomes  Short Term: Participant states understanding of desired cholesterol values and is compliant with medications prescribed. Participant is following exercise prescription and nutrition guidelines.;Long Term: Cholesterol controlled with medications as prescribed, with individualized exercise RX and with personalized nutrition plan. Value goals: LDL < 47mg,68m > 40 mg.       Core Components/Risk Factors/Patient Goals Review:  Goals and Risk Factor Review    Row Name 07/01/19 1002             Core Components/Risk Factors/Patient Goals Review  Personal Goals Review  Weight Management/Obesity;Hypertension;Heart Failure;Lipids       Review  Amber [name] is doing well in rehab.  Her pressures are improving and she is excited to hear the lower numbers.  She is doing well with her heart failure.  She notices that she still gets SOB when talking too much.   Her weight is at 190 lbs which is up a little and could be fluid in lungs.  She is trying to watch what she is eating.       Expected Outcomes  Short: Continue to work on weight loss.  Long: Continue to manage heart failure.          Core Components/Risk Factors/Patient Goals at Discharge (Final Review):  Goals and Risk Factor Review - 07/01/19 1002      Core Components/Risk Factors/Patient Goals Review   Personal Goals Review  Weight Management/Obesity;Hypertension;Heart Failure;Lipids    Review  Amber [name] is doing well in rehab.  Her pressures are improving and she is excited to hear the lower numbers.  She is doing well with her heart failure.  She notices that she still gets SOB when talking too much.   Her weight is at 190 lbs which is up a little and could be fluid in lungs.  She is trying to watch what she is eating.    Expected Outcomes  Short: Continue to work on weight loss.  Long: Continue to manage heart failure.       ITP Comments: ITP Comments    Row Name 06/14/19 1213 06/26/19 0648 07/03/19 1038 07/24/19 1109 07/30/19 1429   ITP Comments  Virtual Initial Orientation completed. Diagnosis can be found in [facility] 8/3. EP/RD orientation scheduled for 8/31 at 1pm  30 Day review. Continue with ITP unless directed changes per Medical Director review.  New to program  Weight up today approx 4lbs from 07/01/19. Denies and trouble breathing, SOB, or swelling in extremities. Some recent med changes have been made by MD but states she is still on a diuretic. Will f/u with MD as she has appt on Friday and monitor weight and symptoms for next 2 days.  30 day review completed. ITP sent to Dr. Emily Filbert, Medical Director of Cardiac and Pulmonary Rehab. Continue with ITP unless changes are made by physician.  Department closed starting 10/2 until further notice by infection prevention and Health at Work teams for [place].  Pt called and will be moving out of [place].  She would like to discharge at this time.  Discharge ITP sent.      Comments: Discharge ITP

## 2019-08-05 NOTE — Progress Notes (Signed)
Cardiology Office Note    Date:  08/09/2019   ID:  Amber [name], DOB 27-Oct-1970, MRN [id number]  PCP:  Doreene Nest, NP  Cardiologist:  Lorine Bears, MD  Electrophysiologist:  None   Chief Complaint: Follow up  History of Present Illness:   Amber [name] is a 48 y.o. 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  Patient was seenby PCPin 03/2019 with complaints of unexplained weight loss, palpitations, anxiety, insomnia, and shortness of breath with TSH being found to be suppressed at less than 0.01 with an elevated free T4 of 5.96. In this setting she was started on methimazole. She was admitted to [facility] 05/2019,with acute hypoxic respiratory failure requiring mechanical ventilation in the setting of possible atypical pneumonia, non-STEMI, pulmonary edema, and hyperthyroidism with thyroid storm in the setting of medication noncompliance. EKG showed sinus tachycardia, 130s bpm, LVH, ST depression consistent with repolarization abnormalities. D-dimer was less than 0.27. High-sensitivity troponin was 57 with a delta troponin of 162, and peaking at 9688. WBC was 19.8. Chest CT revealed patchy groundglass infiltrates bilaterally predominantly within the bases. COVID-19 test was negative. 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 Patient's cath had to be delayed until 8/5 in the setting of fever of 102.6 on 8/4. Patient underwent right and left cardiac cath on 05/22/2019 which  showed significant two-vessel CAD with 80% mid LAD stenosis, occluded OM branches and moderate RCA disease as outlined below. Right heart catheterization showed high normal filling pressures with a mean RA pressure of 8 mmHg, pulmonary capillary wedge pressure of 12 to 13 mmHg, mild pulmonary hypertension and high cardiac output at 8 L/min. The patient underwent successful PCI/DES to the mid LAD. Documented discharge weight of 80.7 kg. Following her discharge,she was seen in the ED on 05/27/2019 with large-volume watery diarrhea following recent antibiotics for potential pneumonia during her admission above. C. difficile and GI panel were negative. Patient was seen by PCP on 05/30/2019 and was feeling well. Her weight remained stable around 182 to 183 pounds. She had not taken any Lasix.She was seen by cardiology on 06/06/2019 and was doing well with some slow weight increase from 182 to 187.4 pounds with pedal edema. She was compliant with diet and medications. Her BP was running in the 130s to 140s systolic. She was started on Lasix 20 mg daily with follow up labs showing stable renal function as below.  She was most recently seen in the office on 06/21/2019 and doing well.  Her weight has gradually trended up to 190 pounds.  She had a ReDs vest reading of 29.  Her weight gain was felt to be in the setting of improved control of her thyroid function in the setting of hyperthyroidism.  She underwent ACE inhibitor washout and initiation of Entresto as well as transition of Lopressor to Toprol-XL and was continued on Lasix 20 mg daily.  Follow-up labs on 06/28/2019 demonstrated stable renal function and potassium as outlined below.  In this setting, spironolactone was recommended.  Unfortunately, we were unable to get a hold of the patient and she was unable to start spironolactone prior to  her visit on 07/05/2019. She was last seen in the office on 07/05/2019 and was doing very well.  She denied any symptoms  of volume overload.  BP had been running in the 140s to 150s mmHg systolic at home.  Her weight had continued a slow gradual trend up to 192 pounds.  Her Entresto was titrated to 49/51 mg bid.  Follow up labs showed stable renal function and high normal potassium as below.   She comes in doing well.  She denies any chest pain, shortness of breath, palpitations, dizziness, presyncope, syncope, lower extremity swelling, abdominal distention, orthopnea, PND, early satiety.  She does note some fatigue now that she is back at work.  Compliant with all medications including dual antiplatelet therapy.  Drinking less than 2 L of fluid per day.  No falls, BRBPR, or melena.   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   Thyroid storm     Past Surgical History:  Procedure Laterality Date   CESAREAN SECTION     x2   CORONARY STENT INTERVENTION N/A 05/22/2019   Procedure: CORONARY STENT INTERVENTION;  Surgeon: Iran OuchArida, Muhammad A, MD;  Location: [facility];  Service: Cardiovascular;  Laterality: N/A;   MASS EXCISION Right 01/18/2013   Procedure: EXCISION right scapular cyst;  Surgeon: Axel FillerArmando Ramirez, MD;  Location: [facility];  Service: General;  Laterality: Right;   RIGHT/LEFT HEART CATH AND CORONARY ANGIOGRAPHY N/A 05/22/2019   Procedure: RIGHT/LEFT HEART CATH AND CORONARY ANGIOGRAPHY;  Surgeon: Iran OuchArida, Muhammad A, MD;  Location: [facility];   Service: Cardiovascular;  Laterality: N/A;    Current Medications: Current Meds  Medication Sig   aspirin 81 MG chewable tablet Chew 1 tablet (81 mg total) by mouth daily.   atorvastatin (LIPITOR) 80 MG tablet Take 1 tablet (80 mg total) by mouth daily at 6 PM.   calcium-vitamin D 250-100 MG-UNIT tablet Take 1 tablet by mouth 2 (two) times daily.   ferrous sulfate 325 (65 FE) MG tablet Take 325 mg by mouth every morning.    furosemide (LASIX) 20 MG tablet Take 1 tablet (20 mg total) by mouth daily.   methimazole (TAPAZOLE) 10 MG tablet Take 1 tablet (10 mg total) by mouth 2 (two) times daily.   metoprolol succinate (TOPROL XL) 25 MG 24 hr tablet Take 1 tablet (25 mg total) by mouth daily.   nitroGLYCERIN (NITROSTAT) 0.4 MG SL tablet Place 1 tablet (0.4 mg total) under the tongue every 5 (five) minutes as needed for chest pain.   potassium chloride SA (K-DUR) 20 MEQ tablet Take 1 tablet (20 mEq total) by mouth daily.   sacubitril-valsartan (ENTRESTO) 49-51 MG Take 1 tablet by mouth 2 (two) times daily.   ticagrelor (BRILINTA) 90 MG TABS tablet Take 1 tablet (90 mg total) by mouth 2 (two) times daily.    Allergies:   Other   Social History   Socioeconomic History  Marital status: Married    Spouse name: Not on file   Number of children: 2   Years of education: Not on file   Highest education level: Not on file  Occupational History    Comment: front desk   Social Needs   Financial resource strain: Not on file   Food insecurity    Worry: Not on file    Inability: Not on file   Transportation needs    Medical: Not on file    Non-medical: Not on file  Tobacco Use   Smoking status: Never Smoker   Smokeless tobacco: Never Used  Substance and Sexual Activity   Alcohol use: Yes    Alcohol/week: 0.0 standard drinks    Comment: rare   Drug use: No   Sexual activity: Not on file  Lifestyle   Physical activity    Days per week: Not on file    Minutes per  session: Not on file   Stress: Not on file  Relationships   Social connections    Talks on phone: Not on file    Gets together: Not on file    Attends religious service: Not on file    Active member of club or organization: Not on file    Attends meetings of clubs or organizations: Not on file    Relationship status: Not on file  Other Topics Concern   Not on file  Social History Narrative   [place]   Married.   2 children.   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     Family History:  The patient's family history includes Diabetes in her father, mother, and sister; Heart disease in her mother; Hypertension in her sister.  ROS:   Review of Systems  Constitutional: Positive for malaise/fatigue. Negative for chills, diaphoresis, fever and weight loss.  HENT: Negative for congestion.   Eyes: Negative for discharge and redness.  Respiratory: Negative for cough, hemoptysis, sputum production, shortness of breath and wheezing.   Cardiovascular: Negative for chest pain, palpitations, orthopnea, claudication, leg swelling and PND.  Gastrointestinal: Negative for abdominal pain, blood in stool, heartburn, melena, nausea and vomiting.  Genitourinary: Negative for hematuria.  Musculoskeletal: Negative for falls and myalgias.  Skin: Negative for rash.  Neurological: Negative for dizziness, tingling, tremors, sensory change, speech change, focal weakness, loss of consciousness and weakness.  Endo/Heme/Allergies: Does not bruise/bleed easily.  Psychiatric/Behavioral: Negative for substance abuse. The patient is not nervous/anxious.   All other systems reviewed and are negative.    EKGs/Labs/Other Studies Reviewed:    Studies reviewed were summarized above. The additional studies were reviewed today:  R/LHC 05/22/2019:  1st Mrg lesion is 100% stenosed.  2nd Mrg lesion is 100% stenosed.  Mid LAD lesion is 80%  stenosed.  Post intervention, there is a 0% residual stenosis.  A drug-eluting stent was successfully placed using a STENT RESOLUTE ONYX 3.0X18.  2nd Diag lesion is 30% stenosed.  Prox RCA to Mid RCA lesion is 50% stenosed.  RPAV lesion is 40% stenosed.  RPDA lesion is 30% stenosed.  1. Significant two-vessel coronary artery disease with 80% mid LAD stenosis, occluded OM branches and moderate RCA disease. 2. Left ventricular angiography was not performed. EF was severely reduced by echo. 3. Right heart catheterization showed high normal filling pressures with mean RA pressure of 8 mmHg, pulmonary capillary wedge pressure of 12 to 13 mmHg, mild pulmonary hypertension and high cardiac output  at 8 L/min. 4. Successful angioplasty and drug-eluting stent placement to the mid LAD.  Recommendations: Continue dual antiplatelet therapy for at least 1 year. The patient needs aggressive medical therapy for the rest of her coronary artery disease. I added high-dose atorvastatin. I added oral metoprolol to control tachycardia. I discontinued IV furosemide for now and she might require resumption of this either intravenously or orally as needed. The patient was alert throughout the procedure . Her hemodynamics are good enough to allow extubation if no other issues. __________  2D Echo 05/21/2019: 1. The left ventricle has severely reduced systolic function, with an ejection fraction of 25-30%. The cavity size was moderately dilated. Left ventricular diastolic parameters were normal. 2. 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 3. The right ventricle has normal systolic function. The cavity was normal. There is no increase in right ventricular wall thickness. 4. The aortic valve is tricuspid. Mild thickening of the aortic valve. 5. The aorta is normal in size and structure.   EKG:  EKG is ordered today.  The EKG ordered today demonstrates sinus bradycardia, 54  bpm, LVH, nonspecific ST-T changes, baseline wander, grossly unchanged from prior  Recent Labs: 05/20/2019: ALT 15; B Natriuretic Peptide 137.5 05/24/2019: Magnesium 2.0 05/30/2019: Hemoglobin 10.1; Platelets 337.0 07/05/2019: BUN 13; Creatinine, Ser 0.59; Potassium 4.8; Sodium 139 07/15/2019: TSH <0.01  Recent Lipid Panel    Component Value Date/Time   CHOL 132 11/21/2018 0911   TRIG 69 05/21/2019 0512   HDL 32.10 (L) 11/21/2018 0911   CHOLHDL 4 11/21/2018 0911   VLDL 18.2 11/21/2018 0911   LDLCALC 82 11/21/2018 0911    PHYSICAL EXAM:    VS:  BP 120/80 (BP Location: Left Arm, Patient Position: Sitting, Cuff Size: Large)    Pulse (!) 54    Temp (!) 97.2 F (36.2 C)    Ht 5\' 4"  (1.626 m)    Wt 201 lb (91.2 kg)    LMP 06/19/2017 (Approximate)    BMI 34.50 kg/m   BMI: Body mass index is 34.5 kg/m.  Physical Exam  Constitutional: She is oriented to person, place, and time. She appears well-developed and well-nourished.  HENT:  Head: Normocephalic and atraumatic.  Eyes: Right eye exhibits no discharge. Left eye exhibits no discharge.  Neck: Normal range of motion. No JVD present.  Cardiovascular: Regular rhythm, S1 normal, S2 normal and normal heart sounds. Bradycardia present. Exam reveals no distant heart sounds, no friction rub, no midsystolic click and no opening snap.  No murmur heard. Pulses:      Dorsalis pedis pulses are 2+ on the right side and 2+ on the left side.       Posterior tibial pulses are 2+ on the right side and 2+ on the left side.  Pulmonary/Chest: Effort normal and breath sounds normal. No respiratory distress. She has no decreased breath sounds. She has no wheezes. She has no rales. She exhibits no tenderness.  Abdominal: Soft. She exhibits no distension. There is no abdominal tenderness.  Musculoskeletal:        General: No edema.  Neurological: She is alert and oriented to person, place, and time.  Skin: Skin is warm and dry. No cyanosis. Nails show no  clubbing.  Psychiatric: She has a normal mood and affect. Her speech is normal and behavior is normal. Judgment and thought content normal.    Wt Readings from Last 3 Encounters:  08/09/19 201 lb (91.2 kg)  07/05/19 192 lb 8 oz (87.3 kg)  06/21/19 190 lb (86.2 kg)     ASSESSMENT & PLAN:   1. CAD involving the native coronary arteries without angina: She is doing well without any symptoms concerning for chest pain.  Continue dual antiplatelet therapy with aspirin and Brilinta without interruption through at least 12 months dating back to the date of her PCI, 05/22/2019.  Decrease metoprolol as outlined below given bradycardia and fatigue.  Continue current dose of Lipitor.  Cardiac rehab.  No plans for further ischemic evaluation.  2. 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  Decrease metoprolol to 12.5 mg daily in the setting of bradycardic heart rates and fatigue.  Continue current dose Entresto.  Add spironolactone 12.5 mg daily.  Check BMP today and again in 1 week.  It does appear she had her repeat echo prematurely on 08/08/2019 with read currently pending at this time.  Patient was not due to have echo until after 08/22/2019 to reevaluate her LV systolic function.  If her EF remains less than 35% on this study we will have to discuss with administration the possibility of this being a no charge study given it was premature.  If EF is greater than 35% on the repeat echo we would not likely need to repeat her echo next month and would continue advancing her evidence-based heart failure therapy as tolerated.  If appropriately timed echo demonstrates her EF continues to remain less than 35% she will be referred to EP for consideration of ICD.  CHF education discussed.  She has had a slow weight gain over the past several months which has not been consistent with volume overload based on ReDs vest readings and clinical picture.  I suspect her weight gain  is in the setting of improved thyroid function control.  3. Hyperthyroidism: She reports compliance with methimazole.  Followed by endocrinology.  4. Hypertension: Blood pressure is reasonably controlled today.  Continue current medications as outlined above.  5. Hyperlipidemia: LDL of 82 from 11/2018 with the patient not on statin therapy at that time.  She was started on Lipitor in 05/2019 with a goal LDL being less than 70.  Recheck fasting lipid panel and liver function in follow-up.  Disposition: F/u with Dr. Fletcher Anon or an APP in 1 month.   Medication Adjustments/Labs and Tests Ordered: Current medicines are reviewed at length with the patient today.  Concerns regarding medicines are outlined above. Medication changes, Labs and Tests ordered today are summarized above and listed in the Patient Instructions accessible in Encounters.   Signed, Christell Faith, PA-C 08/09/2019 10:55 AM     [facility] [address] [place], [place] [address] [phone]

## 2019-08-08 ENCOUNTER — Other Ambulatory Visit: Payer: Self-pay

## 2019-08-08 ENCOUNTER — Ambulatory Visit (INDEPENDENT_AMBULATORY_CARE_PROVIDER_SITE_OTHER): Payer: Commercial Managed Care - PPO

## 2019-08-08 DIAGNOSIS — I255 Ischemic cardiomyopathy: Secondary | ICD-10-CM | POA: Diagnosis not present

## 2019-08-09 ENCOUNTER — Ambulatory Visit (INDEPENDENT_AMBULATORY_CARE_PROVIDER_SITE_OTHER): Payer: Commercial Managed Care - PPO | Admitting: Physician Assistant

## 2019-08-09 ENCOUNTER — Encounter: Payer: Self-pay | Admitting: Physician Assistant

## 2019-08-09 VITALS — BP 120/80 | HR 54 | Temp 97.2°F | Ht 64.0 in | Wt 201.0 lb

## 2019-08-09 DIAGNOSIS — E0590 Thyrotoxicosis, unspecified without thyrotoxic crisis or storm: Secondary | ICD-10-CM | POA: Diagnosis not present

## 2019-08-09 DIAGNOSIS — I10 Essential (primary) hypertension: Secondary | ICD-10-CM

## 2019-08-09 DIAGNOSIS — I255 Ischemic cardiomyopathy: Secondary | ICD-10-CM | POA: Diagnosis not present

## 2019-08-09 DIAGNOSIS — I5022 Chronic systolic (congestive) heart failure: Secondary | ICD-10-CM | POA: Diagnosis not present

## 2019-08-09 DIAGNOSIS — I2510 Atherosclerotic heart disease of native coronary artery without angina pectoris: Secondary | ICD-10-CM | POA: Diagnosis not present

## 2019-08-09 DIAGNOSIS — E785 Hyperlipidemia, unspecified: Secondary | ICD-10-CM

## 2019-08-09 MED ORDER — METOPROLOL SUCCINATE ER 25 MG PO TB24: 12.5000 mg | ORAL_TABLET | Freq: Every day | ORAL | 3 refills | Status: DC

## 2019-08-09 MED ORDER — SPIRONOLACTONE 25 MG PO TABS: 12.5000 mg | ORAL_TABLET | Freq: Every day | ORAL | 3 refills | Status: DC

## 2019-08-09 NOTE — Patient Instructions (Addendum)
Medication Instructions:  1- DECREASE Toprol Take 0.5 tablets (12.5 mg total) by mouth daily 2- START Spironolactone Take 0.5 tablets (12.5 mg total) by mouth daily *If you need a refill on your cardiac medications before your next appointment, please call your pharmacy*  Lab Work: 1- Your physician recommends that you have lab work today(BMET)  2- Your physician recommends that you return for lab work in: 1 week at the medical mall. (BMET) No appt is needed. Hours are M-F 7AM- 6 PM.   If you have labs (blood work) drawn today and your tests are completely normal, you will receive your results only by: Marland Kitchen MyChart Message (if you have MyChart) OR . A paper copy in the mail If you have any lab test that is abnormal or we need to change your treatment, we will call you to review the results.  Testing/Procedures: None ordered   Follow-Up: At [facility], you and your health needs are our priority.  As part of our continuing mission to provide you with exceptional heart care, we have created designated Provider Care Teams.  These Care Teams include your primary Cardiologist (physician) and Advanced Practice Providers (APPs -  Physician Assistants and Nurse Practitioners) who all work together to provide you with the care you need, when you need it.  Your next appointment:   1 month  The format for your next appointment:   In Person  Provider:    You may see [name], MD or Christell Faith, PA-C.

## 2019-08-10 LAB — BASIC METABOLIC PANEL
BUN/Creatinine Ratio: 24 — ABNORMAL HIGH (ref 9–23)
BUN: 13 mg/dL (ref 6–24)
CO2: 23 mmol/L (ref 20–29)
Calcium: 9.3 mg/dL (ref 8.7–10.2)
Chloride: 107 mmol/L — ABNORMAL HIGH (ref 96–106)
Creatinine, Ser: 0.55 mg/dL — ABNORMAL LOW (ref 0.57–1.00)
GFR calc Af Amer: 127 mL/min/{1.73_m2} (ref >59)
GFR calc non Af Amer: 110 mL/min/{1.73_m2} (ref >59)
Glucose: 116 mg/dL — ABNORMAL HIGH (ref 65–99)
Potassium: 4.1 mmol/L (ref 3.5–5.2)
Sodium: 143 mmol/L (ref 134–144)

## 2019-08-14 ENCOUNTER — Telehealth: Payer: Self-pay | Admitting: Primary Care

## 2019-08-14 NOTE — Telephone Encounter (Signed)
Pt called to schedule pneumonia vaccine.  She stated her carodogist recommend her to get    Ok to schedule   Pt stated she needs an appointment with ophthalmologist.    She tried called [facility] they are scheduling 6 months out.  I gave her dr Verlin Fester number [phone]  Darleen Crocker [phone]  [facility] ophthalmology [phone]  Pt stated she is going to call to make her own appointment to ophthalmologies if she has any problems she will call back

## 2019-08-14 NOTE — Telephone Encounter (Signed)
Yes, okay to have Pneumovax pneumonia vaccination.  This is due every 5 years.

## 2019-08-14 NOTE — Telephone Encounter (Signed)
Ok to get the pneumonia vaccine?

## 2019-08-15 NOTE — Telephone Encounter (Signed)
Spoken to patient and schedule nurse visit on 08/29/2019

## 2019-08-16 ENCOUNTER — Telehealth: Payer: Self-pay | Admitting: Physician Assistant

## 2019-08-16 NOTE — Telephone Encounter (Signed)
Labs: Notes recorded by Rise Mu, PA-C on 08/11/2019 at 10:28 AM EDT  Renal function remains normal.  Potassium at goal.  No changes to plan.  Continue with planned BMP 1 week after starting spironolactone.  Pleas see echo report prior to calling patient.   Echo: Notes recorded by Rise Mu, PA-C on 08/11/2019 at 10:27 AM EDT  Echo performed too soon. Was to be scheduled for after 11/5.  Pump function has improved to 40-45%, slightly stiffened heart, mildly elevated pressure along the right side of the heart.   With revascularization and medications, her pump function has improved. No indication for EP referral or ICD at this time.  Please see BMP prior to calling patient.

## 2019-08-16 NOTE — Telephone Encounter (Signed)
Attempted to call the patient. No answer- I left a message to please call back.  

## 2019-08-19 ENCOUNTER — Other Ambulatory Visit: Payer: Self-pay

## 2019-08-19 ENCOUNTER — Other Ambulatory Visit
Admission: RE | Admit: 2019-08-19 | Discharge: 2019-08-19 | Disposition: A | Discharge status: Home / Self Care | Payer: Commercial Managed Care - PPO | Source: Ambulatory Visit | Attending: Physician Assistant | Admitting: Physician Assistant

## 2019-08-19 DIAGNOSIS — I10 Essential (primary) hypertension: Secondary | ICD-10-CM | POA: Insufficient documentation

## 2019-08-19 LAB — BASIC METABOLIC PANEL
Anion gap: 8 (ref 5–15)
BUN: 14 mg/dL (ref 6–20)
CO2: 28 mmol/L (ref 22–32)
Calcium: 9.1 mg/dL (ref 8.9–10.3)
Chloride: 104 mmol/L (ref 98–111)
Creatinine, Ser: 0.56 mg/dL (ref 0.44–1.00)
GFR calc Af Amer: >60 mL/min (ref >60)
GFR calc non Af Amer: >60 mL/min (ref >60)
Glucose, Bld: 103 mg/dL — ABNORMAL HIGH (ref 70–99)
Potassium: 3.9 mmol/L (ref 3.5–5.1)
Sodium: 140 mmol/L (ref 135–145)

## 2019-08-20 NOTE — Telephone Encounter (Signed)
I spoke with the patient regarding her echo results and BMP results from 10/23 & 11/2.  The patient voiced understanding and her no other questions at this time.

## 2019-08-29 ENCOUNTER — Ambulatory Visit (INDEPENDENT_AMBULATORY_CARE_PROVIDER_SITE_OTHER): Payer: Commercial Managed Care - PPO | Admitting: *Deleted

## 2019-08-29 DIAGNOSIS — Z23 Encounter for immunization: Secondary | ICD-10-CM

## 2019-09-04 ENCOUNTER — Ambulatory Visit: Payer: Commercial Managed Care - PPO | Admitting: Internal Medicine

## 2019-09-06 ENCOUNTER — Encounter: Payer: Self-pay | Admitting: Internal Medicine

## 2019-09-06 ENCOUNTER — Other Ambulatory Visit: Payer: Self-pay

## 2019-09-06 ENCOUNTER — Ambulatory Visit: Payer: Commercial Managed Care - PPO | Admitting: Internal Medicine

## 2019-09-06 VITALS — BP 126/76 | HR 66 | Ht 64.0 in | Wt 206.4 lb

## 2019-09-06 DIAGNOSIS — E0500 Thyrotoxicosis with diffuse goiter without thyrotoxic crisis or storm: Secondary | ICD-10-CM

## 2019-09-06 DIAGNOSIS — E0590 Thyrotoxicosis, unspecified without thyrotoxic crisis or storm: Secondary | ICD-10-CM | POA: Diagnosis not present

## 2019-09-06 LAB — T4, FREE: Free T4: 0.64 ng/dL (ref 0.60–1.60)

## 2019-09-06 LAB — TSH: TSH: <0.01 u[IU]/mL — ABNORMAL LOW (ref 0.35–4.50)

## 2019-09-06 MED ORDER — METHIMAZOLE 10 MG PO TABS: 10.0000 mg | ORAL_TABLET | Freq: Two times a day (BID) | ORAL | 6 refills | Status: DC

## 2019-09-06 NOTE — Patient Instructions (Signed)
We recommend that you follow these hyperthyroidism instructions at home:  1) Take Methimazole 10 mg tablets, two tablets daily   If you develop severe sore throat with high fevers OR develop unexplained yellowing of your skin, eyes, under your tongue, severe abdominal pain with nausea or vomiting --> then please get evaluated immediately.  2) Get repeat thyroid labs 6 weeks.   It is ESSENTIAL to get follow-up labs to help avoid over or undertreatment of your hyperthyroidism - both of which can be dangerous to your health.

## 2019-09-06 NOTE — Progress Notes (Signed)
Name: Amber [name]  MRN/ DOB: [id number], [birth year]    Age/ Sex: 48 y.o., female     PCP: Doreene Nest, NP   Reason for Endocrinology Evaluation: Hyperthytroidism     Initial Endocrinology Clinic Visit: 04/03/2019    PATIENT IDENTIFIER: Amber [name] is a 48 y.o., female with a past medical history of HTN and CAD (S/P PCI 05/2019).She has followed with [facility] since 04/03/2019 for consultative assistance with management of her Hyperthyrodism  HISTORICAL SUMMARY: The patient was first diagnosed with hyperthyroidism in 03/2019, with a suppressed TSH at < 0.01 uIU/mL with elevated FT4 5.96 ng/dL. She did have weight loss  For ~ 4 months prior to her presentation.  Methimazole started in 03/2019  SUBJECTIVE:   During last visit (06/04/2019): Continued methimazole   Today (09/06/2019):  [name] is here for a follow up on hyperthyroidism.   No palpitations or chest pain  Denies constipation   No burring or itching in the eyes No local neck symptoms  Last eye exam 09/05/19     ROS:  As per HPI.   HISTORY:  Past Medical History:  Past Medical History:  Diagnosis Date  . Acute encephalopathy   . Anemia   . CAD (coronary artery disease)   . HFrEF (heart failure with reduced ejection fraction) (HCC)   . HTN (hypertension)   . Hyperthyroidism   . 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 Thyroid storm    Past Surgical History:  Past Surgical History:  Procedure Laterality Date  . CESAREAN SECTION     x2  . CORONARY STENT INTERVENTION N/A 05/22/2019   Procedure: CORONARY STENT INTERVENTION;  Surgeon: Iran Ouch, MD;  [facility]: [facility];  Service: Cardiovascular;  Laterality: N/A;  . MASS EXCISION Right 01/18/2013   Procedure: EXCISION right scapular cyst;  Surgeon: Axel Filler, MD;  Location: [facility];  Service: General;  Laterality: Right;  . RIGHT/LEFT HEART CATH AND CORONARY ANGIOGRAPHY N/A 05/22/2019   Procedure: RIGHT/LEFT  HEART CATH AND CORONARY ANGIOGRAPHY;  Surgeon: Iran Ouch, MD;  Location: [facility];  Service: Cardiovascular;  Laterality: N/A;    Social History:  reports that she has never smoked. She has never used smokeless tobacco. She reports current alcohol use. She reports that she does not use drugs. Family History:  Family History  Problem Relation Age of Onset  . Diabetes Father   . Diabetes Mother   . Heart disease Mother   . Diabetes Sister   . Hypertension Sister      HOME MEDICATIONS: Allergies as of 09/06/2019      Reactions   Other       Medication List       Accurate as of September 06, 2019  2:14 PM. If you have any questions, ask your nurse or doctor.        aspirin 81 MG chewable tablet Chew 1 tablet (81 mg total) by mouth daily.   atorvastatin 80 MG tablet Commonly known as: LIPITOR Take 1 tablet (80 mg total) by mouth daily at 6 PM.   calcium-vitamin D 250-100 MG-UNIT tablet Take 1 tablet by mouth 2 (two) times daily.   Entresto 49-51 MG Generic drug: sacubitril-valsartan Take 1 tablet by mouth 2 (two) times daily.   ferrous sulfate 325 (65 FE) MG tablet Take 325 mg by mouth every morning.   furosemide 20 MG tablet Commonly known as: LASIX Take 1 tablet (20 mg total) by mouth daily.  methimazole 10 MG tablet Commonly known as: TAPAZOLE Take 1 tablet (10 mg total) by mouth 2 (two) times daily.   metoprolol succinate 25 MG 24 hr tablet Commonly known as: Toprol XL Take 0.5 tablets (12.5 mg total) by mouth daily.   nitroGLYCERIN 0.4 MG SL tablet Commonly known as: Nitrostat Place 1 tablet (0.4 mg total) under the tongue every 5 (five) minutes as needed for chest pain.   potassium chloride SA 20 MEQ tablet Commonly known as: KLOR-CON Take 1 tablet (20 mEq total) by mouth daily.   spironolactone 25 MG tablet Commonly known as: ALDACTONE Take 0.5 tablets (12.5 mg total) by mouth daily.   ticagrelor 90 MG Tabs tablet Commonly  known as: BRILINTA Take 1 tablet (90 mg total) by mouth 2 (two) times daily.         OBJECTIVE:   PHYSICAL EXAM: VS: BP 126/76   Pulse 66   Ht 5\' 4"  (1.626 m)   Wt 206 lb 6.4 oz (93.6 kg)   LMP 06/19/2017 (Approximate)   SpO2 98%   BMI 35.43 kg/m    EXAM: General: Pt appears well and is in NAD  Eyes: External eye exam with a stare, but no lid lag or exophthalmos.  EOM intact.    Neck: General: Supple without adenopathy. Thyroid: Thyroid size normal.  No goiter or nodules appreciated. No thyroid bruit.  Lungs: Clear with good BS bilat with no rales, rhonchi, or wheezes  Heart: Auscultation: RRR.  Abdomen: Normoactive bowel sounds, soft, nontender, without masses or organomegaly palpable  Extremities:  BL LE: No pretibial edema normal ROM and strength.  Mental Status: Mood and affect: No depression, anxiety, or agitation     DATA REVIEWED: Results for JUDEA, FENNIMORE (MRN [id number]) as of 09/06/2019 14:14  Ref. Range 07/15/2019 10:49 08/09/2019 11:12 08/19/2019 15:06 09/06/2019 10:41  TSH Latest Ref Range: 0.35 - 4.50 uIU/mL <0.01 (L)   <0.01 (L)  T4,Free(Direct) Latest Ref Range: 0.60 - 1.60 ng/dL 0.69   0.64       ASSESSMENT / PLAN / RECOMMENDATIONS:   1. Hyperthyroidism Secondary to Graves' Disease:  - Clinically euthyroid - No local neck symptoms  - She has been compliant with methimazole without side effects  - FT4 continues to be normal , will continue current dose, as TSH continues to be low   Medications  Methimazole 10 mg BID    2. Graves' Disease:   - She is up to date on ophthalmological exam   This visit occurred during the SARS-CoV-2 public health emergency.  Safety protocols were in place, including screening questions prior to the visit, additional usage of staff PPE, and extensive cleaning of exam room while observing appropriate contact time as indicated for disinfecting solutions.    Labs in 6 weeks  F/U in 3 months   Signed  electronically by: Mack Guise, MD  [facility]  [facility] [address] [place], [place] [address] Phone: [phone] FAX: [phone]      CC: [name], NP [place] [address] Phone: [phone]  Fax: [phone]   Return to Endocrinology clinic as below: Future Appointments  Date Time Provider [place]  09/09/2019 11:30 AM Rise Mu, PA-C [facility]  10/23/2019  9:30 AM [facility] None  12/12/2019  9:50 AM Breckin Savannah, Melanie Crazier, MD [facility] None  12/13/2019  9:30 AM [facility]

## 2019-09-08 NOTE — Progress Notes (Signed)
Cardiology Office Note    Date:  09/09/2019   ID:  Amber [name], DOB [birth year], MRN [id number]  PCP:  Doreene Nest, NP  Cardiologist:  Lorine Bears, MD  Electrophysiologist:  None   Chief Complaint: Follow up  History of Present Illness:   Amber [name] is a 48 y.o. 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  Patient was seenby PCPin 03/2019 with complaints of unexplained weight loss, palpitations, anxiety, insomnia, and shortness of breath with TSH being found to be suppressed at less than 0.01 with an elevated free T4 of 5.96. In this setting she was started on methimazole. She was admitted to [facility] 05/2019,with acute hypoxic respiratory failure requiring mechanical ventilation in the setting of possible atypical pneumonia, non-STEMI, pulmonary edema, and hyperthyroidism with thyroid storm in the setting of medication noncompliance. EKG showed sinus tachycardia, 130s bpm, LVH, ST depression consistent with repolarization abnormalities. D-dimer was less than 0.27. High-sensitivity troponin was 57 with a delta troponin of 162, and peaking at 9688. WBC was 19.8. Chest CT revealed patchy groundglass infiltrates bilaterally predominantly within the bases. COVID-19 test was negative. 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 Patient's cath had to be delayed until 8/5 in the setting of fever of 102.6 on 8/4. Patient underwent right and left cardiac cath on 05/22/2019 which  showed significant two-vessel CAD with 80% mid LAD stenosis, occluded OM branches and moderate RCA disease as outlined below. Right heart catheterization showed high normal filling pressures with a mean RA pressure of 8 mmHg, pulmonary capillary wedge pressure of 12 to 13 mmHg, mild pulmonary hypertension and high cardiac output at 8 L/min. The patient underwent successful PCI/DES to the mid LAD. Documented discharge weight of 80.7 kg. Following her discharge,she was seen in the ED on 05/27/2019 with large-volume watery diarrhea following recent antibiotics for potential pneumonia during her admission above. C. difficile and GI panel were negative. Patient was seen by PCP on 05/30/2019 and was feeling well. Her weight remained stable around 182 to 183 pounds. She had not taken any Lasix.She was seen by cardiology on 06/06/2019 and was doing well with some slow weight increase from 182 to 187.4 pounds with pedal edema. She was compliant with diet and medications. Her BP was running in the 130s to 140s systolic. She was started on Lasix 20 mg daily with follow up labs showing stable renal function.She was seen in the office on 06/21/2019 and doing well. Her weight has gradually trended up to 190 pounds. She had a ReDs vestreading of 29. Her weight gain was felt to be in the setting of improved control of her thyroid function in the setting of hyperthyroidism.She underwent ACE inhibitor washout and initiation of Entresto as well as transition of Lopressor to Toprol-XL and was continued on Lasix 20 mg daily. Follow-up labs on 06/28/2019 demonstrated stable renal function and potassium. In this setting, spironolactone was recommended. Unfortunately, we were unable to get a hold of the patient and she was unable to start spironolactone. She was seen in the office on 07/05/2019 and was doing very well.  She denied any symptoms of volume overload.  BP had been running in the 140s to 785Y mmHg systolic at  home.  Her weight had continued a slow gradual trend up to 192 pounds.  Her Entresto was titrated to 49/51 mg bid.  Follow up labs showed stable renal function and high normal potassium.  Echo performed 08/08/2019 showed an improved EF of 40-45%, DD, normal RVSF and cavity size, mild biatrial enlargement, mild MR/TR, mildly elevated PASP I last saw her on 08/09/2019.  She was doing well.  Her Toprol was decreased to 12.5 mg daily in the setting of bradycardia and fatigue.  She was started on spironolactone 12.5 mg daily  She comes in doing very well from a cardiac perspective.  She denies any chest pain, shortness of breath, palpitations, dizziness, presyncope, syncope, lower extremity swelling, abdominal distention, orthopnea, PND, or early satiety.  She indicates the pharmacy told her it would be $700 for her refill of Entresto.  In this setting, she was unable to refill this and took her last dose of 49/51 mg on 11/21.  She did have some leftover 24/26 mg Entresto tabs and began taking these on 11/22 twice daily.  Her weight has stabilized.  She remains compliant with dual antiplatelet therapy.  No falls, BRBPR, or melena.   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   Thyroid storm     Past Surgical History:  Procedure Laterality Date   CESAREAN SECTION     x2   CORONARY STENT INTERVENTION N/A 05/22/2019   Procedure: CORONARY STENT INTERVENTION;  Surgeon: Wellington Hampshire, MD;  Location: [place] [facility];  Service: Cardiovascular;  Laterality: N/A;   MASS EXCISION Right 01/18/2013   Procedure: EXCISION right scapular cyst;  Surgeon: Ralene Ok, MD;  Location: [facility];  Service: General;  Laterality: Right;   RIGHT/LEFT HEART CATH AND CORONARY ANGIOGRAPHY N/A 05/22/2019   Procedure: RIGHT/LEFT HEART CATH AND CORONARY ANGIOGRAPHY;  Surgeon: Wellington Hampshire, MD;  Location: [place] [facility];  Service: Cardiovascular;  Laterality: N/A;    Current Medications: Current Meds  Medication Sig   aspirin 81 MG chewable tablet Chew 1 tablet (81 mg total) by mouth daily.   atorvastatin (LIPITOR) 80 MG tablet Take 1 tablet (80 mg total) by mouth daily at 6 PM.   calcium-vitamin D 250-100 MG-UNIT tablet Take 1 tablet by mouth 2 (two) times daily.   ferrous sulfate 325 (65 FE) MG tablet Take 325 mg by mouth every morning.    furosemide (LASIX) 20 MG tablet Take 1 tablet (20 mg total) by mouth daily.   methimazole (TAPAZOLE) 10 MG tablet Take 1 tablet (10 mg total) by mouth 2 (two) times daily.   metoprolol succinate (TOPROL XL) 25 MG 24 hr tablet Take 0.5 tablets (12.5 mg total) by mouth daily.   nitroGLYCERIN (NITROSTAT) 0.4 MG SL tablet Place 1 tablet (0.4 mg total) under the tongue every 5 (five) minutes as needed for chest pain.   potassium chloride SA (K-DUR) 20 MEQ tablet Take 1  tablet (20 mEq total) by mouth daily.   sacubitril-valsartan (ENTRESTO) 49-51 MG Take 1 tablet by mouth 2 (two) times daily.   spironolactone (ALDACTONE) 25 MG tablet Take 0.5 tablets (12.5 mg total) by mouth daily.   ticagrelor (BRILINTA) 90 MG TABS tablet Take 1 tablet (90 mg total) by mouth 2 (two) times daily.    Allergies:   Other   Social History   Socioeconomic History   Marital status: Married    Spouse name: Not on file   Number of children: 2   Years of education: Not on file   Highest education level: Not on file  Occupational History    Comment: front desk   Social Needs    Financial resource strain: Not on file   Food insecurity    Worry: Not on file    Inability: Not on file   Transportation needs    Medical: Not on file    Non-medical: Not on file  Tobacco Use   Smoking status: Never Smoker   Smokeless tobacco: Never Used  Substance and Sexual Activity   Alcohol use: Yes    Alcohol/week: 0.0 standard drinks    Comment: rare   Drug use: No   Sexual activity: Not on file  Lifestyle   Physical activity    Days per week: Not on file    Minutes per session: Not on file   Stress: Not on file  Relationships   Social connections    Talks on phone: Not on file    Gets together: Not on file    Attends religious service: Not on file    Active member of club or organization: Not on file    Attends meetings of clubs or organizations: Not on file    Relationship status: Not on file  Other Topics Concern   Not on file  Social History Narrative   [place].   Married.   2 children.   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     Family History:  The patient's family history includes Diabetes in her father, mother, and sister; Heart disease in her mother; Hypertension in her sister.  ROS:   Review of Systems  Constitutional: Positive for malaise/fatigue. Negative for chills, diaphoresis, fever and weight loss.  HENT: Negative for congestion.   Eyes: Negative for discharge and redness.  Respiratory: Negative for cough, hemoptysis, sputum production, shortness of breath and wheezing.   Cardiovascular: Negative for chest pain, palpitations, orthopnea, claudication, leg swelling and PND.  Gastrointestinal: Negative for abdominal pain, blood in stool, heartburn, melena, nausea and vomiting.  Genitourinary: Negative for hematuria.  Musculoskeletal: Negative for falls and myalgias.  Skin: Negative for rash.  Neurological: Negative for dizziness, tingling, tremors, sensory change,  speech change, focal weakness, loss of consciousness and weakness.  Endo/Heme/Allergies: Does not bruise/bleed easily.  Psychiatric/Behavioral: Negative for substance abuse. The patient is not nervous/anxious.   All other systems reviewed and are negative.    EKGs/Labs/Other Studies Reviewed:    Studies reviewed were summarized above. The additional studies were reviewed today:  R/LHC 05/22/2019:  1st Mrg lesion is 100% stenosed.  2nd Mrg lesion is 100% stenosed.  Mid LAD lesion is 80% stenosed.  Post intervention, there is a 0% residual stenosis.  A drug-eluting stent was successfully placed using a STENT RESOLUTE ONYX 3.0X18.  2nd Diag lesion is 30% stenosed.  Prox RCA to Mid RCA lesion is 50% stenosed.  RPAV lesion is 40% stenosed.  RPDA lesion is 30% stenosed.  1. Significant two-vessel coronary artery disease with 80% mid LAD stenosis, occluded OM branches and moderate RCA disease. 2. Left ventricular angiography was not performed. EF was severely reduced by echo. 3. Right heart catheterization showed high normal filling pressures with mean RA pressure of 8 mmHg, pulmonary capillary wedge pressure of 12 to 13 mmHg, mild pulmonary hypertension and high cardiac output at 8 L/min. 4. Successful angioplasty and drug-eluting stent placement to the mid LAD.  Recommendations: Continue dual antiplatelet therapy for at least 1 year. The patient needs aggressive medical therapy for the rest of her coronary artery disease. I added high-dose atorvastatin. I added oral metoprolol to control tachycardia. I discontinued IV furosemide for now and she might require resumption of this either intravenously or orally as needed. The patient was alert throughout the procedure . Her hemodynamics are good enough to allow extubation if no other issues. __________  2D Echo 05/21/2019: 1. The left ventricle has severely reduced systolic function, with an ejection fraction of 25-30%. The  cavity size was moderately dilated. Left ventricular diastolic parameters were normal. 2. 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 3. The right ventricle has normal systolic function. The cavity was normal. There is no increase in right ventricular wall thickness. 4. The aortic valve is tricuspid. Mild thickening of the aortic valve. 5. The aorta is normal in size and structure. __________  2D Echo 08/09/2019: 1. Left ventricular ejection fraction, by visual estimation, is 40 to 45%. The left ventricle has severely decreased function. Mildly increased left ventricular size. There is no left ventricular hypertrophy.  2. Left ventricular diastolic Doppler parameters are consistent with pseudonormalization pattern of LV diastolic filling.  3. Global right ventricle has normal systolic function.The right ventricular size is normal. No increase in right ventricular wall thickness.  4. Left atrial size was mildly dilated.  5. Mildly elevated pulmonary artery systolic pressure.   EKG:  EKG is ordered today.  The EKG ordered today demonstrates sinus bradycardia, 54 bpm, LVH, no acute ST-T changes  Recent Labs: 05/20/2019: ALT 15; B Natriuretic Peptide 137.5 05/24/2019: Magnesium 2.0 05/30/2019: Hemoglobin 10.1; Platelets 337.0 08/19/2019: BUN 14; Creatinine, Ser 0.56; Potassium 3.9; Sodium 140 09/06/2019: TSH <0.01  Recent Lipid Panel    Component Value Date/Time   CHOL 132 11/21/2018 0911   TRIG 69 05/21/2019 0512   HDL 32.10 (L) 11/21/2018 0911   CHOLHDL 4 11/21/2018 0911   VLDL 18.2 11/21/2018 0911   LDLCALC 82 11/21/2018 0911    PHYSICAL EXAM:    VS:  BP 120/80 (BP Location: Left Arm, Patient Position: Sitting, Cuff Size: Large)    Pulse (!) 57    Temp (!) 97.3 F (36.3 C)    Ht 5\' 4"  (1.626 m)    Wt 203 lb 4 oz (92.2 kg)    LMP 06/19/2017 (Approximate)    BMI 34.89 kg/m   BMI: Body mass index is 34.89 kg/m.  Physical Exam  Constitutional: She is  oriented to person, place, and time. She appears well-developed and well-nourished.  HENT:  Head: Normocephalic and atraumatic.  Eyes: Right eye exhibits no discharge. Left eye exhibits no discharge.  Neck: Normal range of motion. No JVD present.  Cardiovascular: Regular rhythm, S1 normal, S2 normal and normal heart sounds. Bradycardia present. Exam reveals no distant heart sounds, no friction rub, no midsystolic click and no opening snap.  No murmur heard. Pulses:  Posterior tibial pulses are 2+ on the right side and 2+ on the left side.  Pulmonary/Chest: Effort normal and breath sounds normal. No respiratory distress. She has no decreased breath sounds. She has no wheezes. She has no rales. She exhibits no tenderness.  Abdominal: Soft. She exhibits no distension. There is no abdominal tenderness.  Musculoskeletal:        General: No edema.  Neurological: She is alert and oriented to person, place, and time.  Skin: Skin is warm and dry. No cyanosis. Nails show no clubbing.  Psychiatric: She has a normal mood and affect. Her speech is normal and behavior is normal. Judgment and thought content normal.    Wt Readings from Last 3 Encounters:  09/09/19 203 lb 4 oz (92.2 kg)  09/06/19 206 lb 6.4 oz (93.6 kg)  08/09/19 201 lb (91.2 kg)     ASSESSMENT & PLAN:   1. CAD involving the native coronary arteries without angina: She is doing well but any symptoms concerning for chest pain.  Continue dual antiplatelet therapy with aspirin and Brilinta without interruption through at least 12 months dating back to the date of her PCI, 05/22/2019.  Continue current dose of Toprol-XL and Lipitor.  No plans for further ischemic evaluation at this time.  2. 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  Unfortunately, she is having difficulty affording Entresto and has self decreased her dosage back down to 24/26 mg twice daily given this is what she had leftover  prior to the titration to 49/51 mg twice daily.  We have supplied her with 1 month of samples for 24/26 mg twice daily along with paperwork to complete for patient assistance.  She will contact our office prior to running out of Entresto if she has not heard back from patient assistance.  At that time, if she has not been approved for patient assistance with Sherryll Burger we will need to transition her back to losartan.  Otherwise, she will continue current dose of Toprol-XL and spironolactone.  Most recent follow-up BMP showed a stable renal function and potassium.  Follow-up echo after percutaneous revascularization and optimization of medical therapy showed improved LV systolic function with an EF of 45%.  Continue to optimize medical therapy as able and advance activity.   3. Hyperthyroidism: Compliant with methimazole.  Followed by endocrinology.  Weight has stabilized.  4. HTN: Blood pressure is well controlled.  Continue current regimen as outlined above.  5. HLD: LDL of 82 from 11/2018 with patient not on statin therapy at that time.  She was started on atorvastatin in 05/2019 with goal LDL being less than 70.  Order has been placed for [facility] to [facility] fasting lipid panel and liver function at her convenience.  If LDL remains above goal at that time recommend addition of Zetia 10 mg daily.  Disposition: F/u with Dr. Kirke Corin or an APP in 3 months.   Medication Adjustments/Labs and Tests Ordered: Current medicines are reviewed at length with the patient today.  Concerns regarding medicines are outlined above. Medication changes, Labs and Tests ordered today are summarized above and listed in the Patient Instructions accessible in Encounters.   Signed, Eula Listen, PA-C 09/09/2019 11:39 AM     [facility] -  [address] [place], [place] [address] [phone]

## 2019-09-09 ENCOUNTER — Ambulatory Visit (INDEPENDENT_AMBULATORY_CARE_PROVIDER_SITE_OTHER): Payer: Commercial Managed Care - PPO | Admitting: Physician Assistant

## 2019-09-09 ENCOUNTER — Encounter: Payer: Self-pay | Admitting: Physician Assistant

## 2019-09-09 ENCOUNTER — Other Ambulatory Visit: Payer: Self-pay

## 2019-09-09 VITALS — BP 120/80 | HR 57 | Temp 97.3°F | Ht 64.0 in | Wt 203.2 lb

## 2019-09-09 DIAGNOSIS — I5022 Chronic systolic (congestive) heart failure: Secondary | ICD-10-CM

## 2019-09-09 DIAGNOSIS — I255 Ischemic cardiomyopathy: Secondary | ICD-10-CM | POA: Diagnosis not present

## 2019-09-09 DIAGNOSIS — E0590 Thyrotoxicosis, unspecified without thyrotoxic crisis or storm: Secondary | ICD-10-CM | POA: Diagnosis not present

## 2019-09-09 DIAGNOSIS — I2510 Atherosclerotic heart disease of native coronary artery without angina pectoris: Secondary | ICD-10-CM

## 2019-09-09 DIAGNOSIS — E785 Hyperlipidemia, unspecified: Secondary | ICD-10-CM

## 2019-09-09 DIAGNOSIS — I10 Essential (primary) hypertension: Secondary | ICD-10-CM

## 2019-09-09 NOTE — Patient Instructions (Addendum)
Medication Instructions:   1. Your physician recommends that you continue on your current medications as directed. Please refer to the Current Medication list given to you today.  *If you need a refill on your cardiac medications before your next appointment, please call your pharmacy*  Lab Work:  1. Your physician recommends that you return for lab work at your convience at the medical mall. (liver/lipid) You will need to be fasting.  No appt is needed. Hours are M-F 7AM- 6 PM.  If you have labs (blood work) drawn today and your tests are completely normal, you will receive your results only by: Marland Kitchen MyChart Message (if you have MyChart) OR . A paper copy in the mail If you have any lab test that is abnormal or we need to change your treatment, we will call you to review the results.  Testing/Procedures:  1. None ordered  Follow-Up: At [facility], you and your health needs are our priority.  As part of our continuing mission to provide you with exceptional heart care, we have created designated Provider Care Teams.  These Care Teams include your primary Cardiologist (physician) and Advanced Practice Providers (APPs -  Physician Assistants and Nurse Practitioners) who all work together to provide you with the care you need, when you need it.  Your next appointment:   3 month(s)  The format for your next appointment:   In Person  Provider:    You may see [name], MD or one of the following Advanced Practice Providers on your designated Care Team:    Murray Hodgkins, NP  Christell Faith, PA-C  Marrianne Mood, PA-C   Other Instructions  1. Medication Samples have been provided to the patient.  Drug name: ENTRESTO      Strength: 24/26       Qty: 2 bottle  LOT: [id number]  Exp.Date: 05/22  Amber [name] 12:01 PM 09/09/2019

## 2019-10-08 ENCOUNTER — Telehealth: Payer: Self-pay | Admitting: Physician Assistant

## 2019-10-08 NOTE — Telephone Encounter (Signed)
I called and spoke with the patient as I do not see any documentation in her chart that we were working on patient assistance for [facility] for her.  I have looked several places in the office for any trace of an application and cannot find one.  I advised the patient of this. I asked if she actually completed the paperwork and she claims she did.  I asked her where she sent it when she completed her portion and she wasn't sure if she mailed it here or to the company.  The patient is willing to come back to the office tomorrow to pick up another application. I have advised her to fill out the patient portion, then return to our office for completion and we will sent this in for her.  The patient voices understanding and is agreeable.   [employer] application placed at our front desk for patient pick up.

## 2019-10-08 NOTE — Telephone Encounter (Addendum)
Pt is almost out of the samples of her Amber [name] and she is asking the status of her patient assistance.. she says she has filled out the paperwork already. I will forward to the nurse that has been assisting her for review. Unable to locate paperwork in the pts record.

## 2019-10-08 NOTE — Telephone Encounter (Signed)
Patient calling Patient was told when she was getting towards the end of Entresto medication to call in and make office aware   States R Dunn was considering maybe prescribing something different  Please call to discuss

## 2019-10-14 ENCOUNTER — Other Ambulatory Visit
Admission: RE | Admit: 2019-10-14 | Discharge: 2019-10-14 | Disposition: A | Discharge status: Home / Self Care | Payer: Commercial Managed Care - PPO | Source: Ambulatory Visit | Attending: Physician Assistant | Admitting: Physician Assistant

## 2019-10-14 DIAGNOSIS — I5022 Chronic systolic (congestive) heart failure: Secondary | ICD-10-CM | POA: Diagnosis not present

## 2019-10-14 LAB — LIPID PANEL
Cholesterol: 144 mg/dL (ref 0–200)
HDL: 55 mg/dL (ref >40)
LDL Cholesterol: 75 mg/dL (ref 0–99)
Total CHOL/HDL Ratio: 2.6 RATIO
Triglycerides: 69 mg/dL (ref <150)
VLDL: 14 mg/dL (ref 0–40)

## 2019-10-14 LAB — HEPATIC FUNCTION PANEL
ALT: 19 U/L (ref 0–44)
AST: 18 U/L (ref 15–41)
Albumin: 4 g/dL (ref 3.5–5.0)
Alkaline Phosphatase: 129 U/L — ABNORMAL HIGH (ref 38–126)
Bilirubin, Direct: <0.1 mg/dL (ref 0.0–0.2)
Total Bilirubin: 0.7 mg/dL (ref 0.3–1.2)
Total Protein: 7.6 g/dL (ref 6.5–8.1)

## 2019-10-15 ENCOUNTER — Telehealth: Payer: Self-pay

## 2019-10-15 DIAGNOSIS — I5022 Chronic systolic (congestive) heart failure: Secondary | ICD-10-CM

## 2019-10-15 NOTE — Telephone Encounter (Signed)
Attempted to call patient. LMTCB 10/15/2019   

## 2019-10-15 NOTE — Telephone Encounter (Signed)
-----   Message from Theora Gianotti, NP sent at 10/14/2019  4:31 PM EST ----- LDL not quite @ goal.  Currently 75 - goal < 70.  We should add zetia 10mg  daily, if she'd be willing.  Alkaline phosphatase is minimally elevated, but lft's otw ok.  Plan to f/u lipids/lft's in 6-8 wks after starting zetia.

## 2019-10-22 MED ORDER — EZETIMIBE 10 MG PO TABS: 10.0000 mg | ORAL_TABLET | Freq: Every day | ORAL | 3 refills | Status: DC

## 2019-10-22 NOTE — Telephone Encounter (Signed)
Call to patient to review results and POC. Rx placed and repeats labs in order. Advised pt to call for any further questions or concerns.

## 2019-10-23 ENCOUNTER — Other Ambulatory Visit: Payer: Commercial Managed Care - PPO

## 2019-10-30 ENCOUNTER — Other Ambulatory Visit: Payer: Commercial Managed Care - PPO

## 2019-10-31 ENCOUNTER — Other Ambulatory Visit: Payer: Self-pay

## 2019-10-31 ENCOUNTER — Other Ambulatory Visit (INDEPENDENT_AMBULATORY_CARE_PROVIDER_SITE_OTHER): Payer: Commercial Managed Care - PPO

## 2019-10-31 DIAGNOSIS — E0590 Thyrotoxicosis, unspecified without thyrotoxic crisis or storm: Secondary | ICD-10-CM | POA: Diagnosis not present

## 2019-10-31 LAB — T4, FREE: Free T4: 0.57 ng/dL — ABNORMAL LOW (ref 0.60–1.60)

## 2019-10-31 LAB — TSH: TSH: 0.06 u[IU]/mL — ABNORMAL LOW (ref 0.35–4.50)

## 2019-11-01 ENCOUNTER — Telehealth: Payer: Self-pay | Admitting: Internal Medicine

## 2019-11-01 MED ORDER — METHIMAZOLE 10 MG PO TABS: 15.0000 mg | ORAL_TABLET | Freq: Every day | ORAL | 6 refills | Status: DC

## 2019-11-01 NOTE — Telephone Encounter (Signed)
Lft vm to return call 

## 2019-11-01 NOTE — Telephone Encounter (Signed)
Please let her know that her thyroid is doing better and I suggest cutting the methimazole down to ONE and a HALF tablet daily (currently at 2 a day )     Thanks   Abby Raelyn Mora, MD  [facility] Endocrinology  [facility] [address] [place], [place] [address] Phone: [phone] FAX: [phone]

## 2019-11-04 NOTE — Telephone Encounter (Signed)
Pt aware of results and instructions.  

## 2019-12-09 ENCOUNTER — Other Ambulatory Visit: Payer: Self-pay | Admitting: Internal Medicine

## 2019-12-09 ENCOUNTER — Other Ambulatory Visit
Admission: RE | Admit: 2019-12-09 | Discharge: 2019-12-09 | Disposition: A | Discharge status: Home / Self Care | Payer: Commercial Managed Care - PPO | Source: Ambulatory Visit | Attending: Nurse Practitioner | Admitting: Nurse Practitioner

## 2019-12-09 DIAGNOSIS — E876 Hypokalemia: Secondary | ICD-10-CM

## 2019-12-09 DIAGNOSIS — I509 Heart failure, unspecified: Secondary | ICD-10-CM

## 2019-12-09 DIAGNOSIS — I5022 Chronic systolic (congestive) heart failure: Secondary | ICD-10-CM | POA: Diagnosis not present

## 2019-12-09 DIAGNOSIS — E0590 Thyrotoxicosis, unspecified without thyrotoxic crisis or storm: Secondary | ICD-10-CM

## 2019-12-09 LAB — LIPID PANEL
Cholesterol: 140 mg/dL (ref 0–200)
HDL: 52 mg/dL (ref >40)
LDL Cholesterol: 74 mg/dL (ref 0–99)
Total CHOL/HDL Ratio: 2.7 RATIO
Triglycerides: 71 mg/dL (ref <150)
VLDL: 14 mg/dL (ref 0–40)

## 2019-12-09 LAB — T4, FREE: Free T4: 0.5 ng/dL — ABNORMAL LOW (ref 0.61–1.12)

## 2019-12-09 LAB — HEPATIC FUNCTION PANEL
ALT: 16 U/L (ref 0–44)
AST: 18 U/L (ref 15–41)
Albumin: 4.4 g/dL (ref 3.5–5.0)
Alkaline Phosphatase: 115 U/L (ref 38–126)
Bilirubin, Direct: <0.1 mg/dL (ref 0.0–0.2)
Total Bilirubin: 0.8 mg/dL (ref 0.3–1.2)
Total Protein: 8.1 g/dL (ref 6.5–8.1)

## 2019-12-09 LAB — TSH: TSH: 2.755 u[IU]/mL (ref 0.350–4.500)

## 2019-12-10 ENCOUNTER — Telehealth: Payer: Self-pay

## 2019-12-10 ENCOUNTER — Other Ambulatory Visit: Payer: Self-pay

## 2019-12-10 ENCOUNTER — Telehealth: Payer: Self-pay | Admitting: Internal Medicine

## 2019-12-10 DIAGNOSIS — E785 Hyperlipidemia, unspecified: Secondary | ICD-10-CM

## 2019-12-10 NOTE — Telephone Encounter (Signed)
Pt has appt tomorrow

## 2019-12-10 NOTE — Telephone Encounter (Signed)
Lft vm to return call 

## 2019-12-10 NOTE — Telephone Encounter (Signed)
-----   Message from Creig Hines, NP sent at 12/09/2019  5:05 PM EST ----- Lft's wnl. Please ensure that she is taking atorvastatin.  If so, we should start zetia 10mg  daily.  F/u lipids/lft's in 6-8 wks. As I haven't seen her in the office before, that order should probably go under a provider that has ).

## 2019-12-10 NOTE — Telephone Encounter (Signed)
Please let her know her know that based on her current thyroid function, need to cut the methimazole down from one and half tablet to ONE tablet daily.    Will recheck on next visit     Thanks Abby Raelyn Mora, MD  [facility] Endocrinology  [facility] [address] [place], [place] [address] Phone: [phone] FAX: [phone]

## 2019-12-10 NOTE — Telephone Encounter (Signed)
Called patient to review all recent labs. Pt verbalized understanding and no had no further questions at this time.   Orders placed as advised.   Advised pt to call for any further questions or concerns.

## 2019-12-11 ENCOUNTER — Ambulatory Visit: Payer: Commercial Managed Care - PPO | Admitting: Internal Medicine

## 2019-12-11 ENCOUNTER — Encounter: Payer: Self-pay | Admitting: Internal Medicine

## 2019-12-11 VITALS — BP 118/80 | HR 67 | Temp 98.3°F | Ht 64.0 in | Wt 216.2 lb

## 2019-12-11 DIAGNOSIS — E0500 Thyrotoxicosis with diffuse goiter without thyrotoxic crisis or storm: Secondary | ICD-10-CM

## 2019-12-11 DIAGNOSIS — E0590 Thyrotoxicosis, unspecified without thyrotoxic crisis or storm: Secondary | ICD-10-CM | POA: Diagnosis not present

## 2019-12-11 MED ORDER — METHIMAZOLE 10 MG PO TABS: 10.0000 mg | ORAL_TABLET | Freq: Every day | ORAL | 0 refills | Status: DC

## 2019-12-11 MED ORDER — METHIMAZOLE 5 MG PO TABS: 10.0000 mg | ORAL_TABLET | Freq: Every day | ORAL | 6 refills | Status: DC

## 2019-12-11 NOTE — Patient Instructions (Signed)
-   Decrease Methimazole to 1 tablet daily

## 2019-12-11 NOTE — Progress Notes (Signed)
Name: Amber [name]  MRN/ DOB: [id number], 1971/09/14    Age/ Sex: 49 y.o., female     PCP: [name], NP   Reason for Endocrinology Evaluation: Hyperthytroidism     Initial Endocrinology Clinic Visit: 04/03/2019    PATIENT IDENTIFIER: Amber [name] is a 49 y.o., female with a past medical history of HTN and CAD (S/P PCI 05/2019).She has followed with [place] [facility] since 04/03/2019 for consultative assistance with management of her Hyperthyrodism  HISTORICAL SUMMARY: The patient was first diagnosed with hyperthyroidism in 03/2019, with a suppressed TSH at < 0.01 uIU/mL with elevated FT4 5.96 ng/dL. She did have weight loss  For ~ 4 months prior to her presentation.  Methimazole started in 03/2019  SUBJECTIVE:   During last visit (06/04/2019): Continued methimazole   Today (12/11/2019):  Amber [name] is here for a follow up on hyperthyroidism.   Patient has noted weight gain which is concerning to her. Denies constipation She is not sure if she has depression  She has occasional mild chest pain.   No burring or itching in the eyes No local neck symptoms  Last eye exam 09/05/19     ROS:  As per HPI.   HISTORY:  Past Medical History:  Past Medical History:  Diagnosis Date  . Acute encephalopathy   . Anemia   . CAD (coronary artery disease)   . HFrEF (heart failure with reduced ejection fraction) ([place])   . HTN (hypertension)   . Hyperthyroidism   . 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 Thyroid storm    Past Surgical History:  Past Surgical History:  Procedure Laterality Date  . CESAREAN SECTION     x2  . CORONARY STENT INTERVENTION N/A 05/22/2019   Procedure: CORONARY STENT INTERVENTION;  Surgeon: Wellington Hampshire, MD;  Location: [place] [facility];  Service: Cardiovascular;  Laterality: N/A;  . MASS EXCISION Right 01/18/2013   Procedure: EXCISION right scapular cyst;  Surgeon: Ralene Ok, MD;  Location: [facility];  Service: General;   Laterality: Right;  . RIGHT/LEFT HEART CATH AND CORONARY ANGIOGRAPHY N/A 05/22/2019   Procedure: RIGHT/LEFT HEART CATH AND CORONARY ANGIOGRAPHY;  Surgeon: Wellington Hampshire, MD;  Location: [place] [facility];  Service: Cardiovascular;  Laterality: N/A;    Social History:  reports that she has never smoked. She has never used smokeless tobacco. She reports current alcohol use. She reports that she does not use drugs. Family History:  Family History  Problem Relation Age of Onset  . Diabetes Father   . Diabetes Mother   . Heart disease Mother   . Diabetes Sister   . Hypertension Sister      HOME MEDICATIONS: Allergies as of 12/11/2019      Reactions   Other       Medication List       Accurate as of December 11, 2019  8:28 AM. If you have any questions, ask your nurse or doctor.        aspirin 81 MG chewable tablet Chew 1 tablet (81 mg total) by mouth daily.   atorvastatin 80 MG tablet Commonly known as: LIPITOR Take 1 tablet (80 mg total) by mouth daily at 6 PM.   calcium-vitamin D 250-100 MG-UNIT tablet Take 1 tablet by mouth 2 (two) times daily.   Entresto 49-51 MG Generic drug: sacubitril-valsartan Take 1 tablet by mouth 2 (two) times daily.   ezetimibe 10 MG tablet Commonly known as: ZETIA Take 1 tablet (10 mg total)  by mouth daily.   ferrous sulfate 325 (65 FE) MG tablet Take 325 mg by mouth every morning.   furosemide 20 MG tablet Commonly known as: LASIX Take 1 tablet (20 mg total) by mouth daily.   methimazole 10 MG tablet Commonly known as: TAPAZOLE Take 1.5 tablets (15 mg total) by mouth daily.   metoprolol succinate 25 MG 24 hr tablet Commonly known as: Toprol XL Take 0.5 tablets (12.5 mg total) by mouth daily.   nitroGLYCERIN 0.4 MG SL tablet Commonly known as: Nitrostat Place 1 tablet (0.4 mg total) under the tongue every 5 (five) minutes as needed for chest pain.   potassium chloride SA 20 MEQ tablet Commonly known as: KLOR-CON Take 1  tablet (20 mEq total) by mouth daily.   spironolactone 25 MG tablet Commonly known as: ALDACTONE Take 0.5 tablets (12.5 mg total) by mouth daily.   ticagrelor 90 MG Tabs tablet Commonly known as: BRILINTA Take 1 tablet (90 mg total) by mouth 2 (two) times daily.         OBJECTIVE:   PHYSICAL EXAM: VS: BP 118/80 (BP Location: Right Arm, Patient Position: Sitting, Cuff Size: Large)   Pulse 67   Temp 98.3 F (36.8 C)   Ht 5\' 4"  (1.626 m)   Wt 216 lb 3.2 oz (98.1 kg)   LMP 06/19/2017 (Approximate)   SpO2 98%   BMI 37.11 kg/m    EXAM: General: Pt appears well and is in NAD  Eyes: External eye exam with a stare, but no lid lag or exophthalmos.  EOM intact.    Neck: General: Supple without adenopathy. Thyroid: Thyroid size is prominent.  No nodules appreciated. No thyroid bruit.  Lungs: Clear with good BS bilat with no rales, rhonchi, or wheezes  Heart: Auscultation: RRR.  Extremities:  BL LE: No pretibial edema normal ROM and strength.  Mental Status: Mood and affect: No depression, anxiety, or agitation     DATA REVIEWED:  Results for ZIGGY, REVELES (MRN [name]) as of 12/11/2019 12:31  Ref. Range 12/09/2019 14:20  TSH Latest Ref Range: 0.350 - 4.500 uIU/mL 2.755  T4,Free(Direct) Latest Ref Range: 0.61 - 1.12 ng/dL 12/11/2019 (L)     ASSESSMENT / PLAN / RECOMMENDATIONS:   1. Hyperthyroidism Secondary to Graves' Disease:  - Clinically euthyroid - No local neck symptoms  - She has been compliant with methimazole without side effects  -TSH has normalized, free T4 is a little bit below normal, we will reduce her methimazole as below  Medications  Methimazole 10 mg  daily   2. Graves' Disease:   - She is up to date on ophthalmological exam    Follow-up in 4 months  Labs in 8 weeks  Signed electronically by: 4.28, MD  [facility] Endocrinology  [facility] [address] [place] [place], [place] Phone:  [phone] FAX: [phone]      CC: [phone], NP [address] [place] Phone: [phone]  Fax: [phone]   Return to Endocrinology clinic as below: Future Appointments  Date Time Provider Department Center  12/12/2019  8:00 AM 12/14/2019, MD [facility]  12/13/2019  9:30 AM [facility]

## 2019-12-12 ENCOUNTER — Encounter: Payer: Self-pay | Admitting: Cardiovascular Disease

## 2019-12-12 ENCOUNTER — Ambulatory Visit: Payer: Commercial Managed Care - PPO | Admitting: Internal Medicine

## 2019-12-12 ENCOUNTER — Ambulatory Visit (INDEPENDENT_AMBULATORY_CARE_PROVIDER_SITE_OTHER): Payer: Commercial Managed Care - PPO | Admitting: Cardiovascular Disease

## 2019-12-12 ENCOUNTER — Other Ambulatory Visit: Payer: Self-pay

## 2019-12-12 VITALS — BP 110/82 | HR 54 | Ht 64.0 in | Wt 215.8 lb

## 2019-12-12 DIAGNOSIS — I10 Essential (primary) hypertension: Secondary | ICD-10-CM

## 2019-12-12 DIAGNOSIS — I2510 Atherosclerotic heart disease of native coronary artery without angina pectoris: Secondary | ICD-10-CM | POA: Diagnosis not present

## 2019-12-12 DIAGNOSIS — E785 Hyperlipidemia, unspecified: Secondary | ICD-10-CM | POA: Diagnosis not present

## 2019-12-12 DIAGNOSIS — I5022 Chronic systolic (congestive) heart failure: Secondary | ICD-10-CM

## 2019-12-12 MED ORDER — LOSARTAN POTASSIUM 50 MG PO TABS: 50.0000 mg | ORAL_TABLET | Freq: Every day | ORAL | 3 refills | Status: DC

## 2019-12-12 NOTE — Progress Notes (Signed)
Cardiology Office Note   Date:  12/12/2019   ID:  Amber [name], DOB [birth year], MRN [id number]  PCP:  Doreene Nest, NP  Cardiologist:   Amber Bears, Amber [name]   Chief Complaint  Patient presents with  . office visit    3 month F/U-Patient reports increased bruising; Meds verbally reviewed with patient.      History of Present Illness: Amber [name] is a 49 y.o. female who presents for follow-up visit regarding coronary artery disease and chronic systolic heart failure.  She has other chronic medical conditions including essential hypertension, history of hypothyroidism with previous thyroid storm, anemia and obesity. She was diagnosed with severe hyperthyroidism in August 2020.  She was hospitalized at that time with acute respiratory failure requiring mechanical ventilation.  She had non-STEMI and pulmonary edema.  Echocardiogram showed an EF of 25 to 30% with wall motion abnormalities suggestive of Takotsubo cardiomyopathy.  She underwent a right and left cardiac catheterization and was found to have two-vessel coronary artery disease with 80% stenosis in the mid LAD, occluded OM branches and moderate RCA disease.  PCI and drug-eluting stent placement was done to the mid LAD.  Heart failure medications were optimized and hypothyroidism was treated.  Repeat echocardiogram in October showed an EF of 40 to 45%. She is doing very well with no chest pain or shortness of breath.  No orthopnea or leg edema.  She takes her medications regularly and complains of easy bruising.  She is having hard time affording Entresto and she has get weekly supplies at $140.  She is not able to afford 1 full month. She has gained some weight gradually and she might be becoming hypothyroid.     Past Medical History:  Diagnosis Date  . Acute encephalopathy   . Anemia   . CAD (coronary artery disease)   . HFrEF (heart failure with reduced ejection fraction) ([facility])   . HTN (hypertension)   . Hyperthyroidism    . 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 Thyroid storm     Past Surgical History:  Procedure Laterality Date  . CARDIAC CATHETERIZATION    . CESAREAN SECTION     x2  . CORONARY STENT INTERVENTION N/A 05/22/2019   Procedure: CORONARY STENT INTERVENTION;  Surgeon: Iran Ouch, Amber [name];  [facility];  Service: Cardiovascular;  Laterality: N/A;  . MASS EXCISION Right 01/18/2013   Procedure: EXCISION right scapular cyst;  Surgeon: Axel Filler, Amber [name];  Location: [facility];  Service: General;  Laterality: Right;  . RIGHT/LEFT HEART CATH AND CORONARY ANGIOGRAPHY N/A 05/22/2019   Procedure: RIGHT/LEFT HEART CATH AND CORONARY ANGIOGRAPHY;  Surgeon: Iran Ouch, Amber [name];  Location: [facility];  Service: Cardiovascular;  Laterality: N/A;     Current Outpatient Medications  Medication Sig Dispense Refill  . aspirin 81 MG chewable tablet Chew 1 tablet (81 mg total) by mouth daily. 30 tablet 11  . atorvastatin (LIPITOR) 80 MG tablet Take 1 tablet (80 mg total) by mouth daily at 6 PM. 30 tablet 11  . calcium-vitamin D 250-100 MG-UNIT tablet Take 1 tablet by mouth 2 (two) times daily.    . ferrous sulfate 325 (65 FE) MG tablet Take 325 mg by mouth every morning.     . furosemide (LASIX) 20 MG tablet Take 1 tablet (20 mg total) by mouth daily. 90 tablet 3  . methimazole (TAPAZOLE) 5 MG tablet Take 2 tablets (10 mg total) by mouth daily. 60 tablet 6  .  metoprolol succinate (TOPROL XL) 25 MG 24 hr tablet Take 0.5 tablets (12.5 mg total) by mouth daily. 45 tablet 3  . nitroGLYCERIN (NITROSTAT) 0.4 MG SL tablet Place 1 tablet (0.4 mg total) under the tongue every 5 (five) minutes as needed for chest pain. 100 tablet 3  . potassium chloride SA (K-DUR) 20 MEQ tablet Take 1 tablet (20 mEq total) by mouth daily. 90 tablet 3  . sacubitril-valsartan (ENTRESTO) 49-51 MG Take 1 tablet by mouth 2 (two) times daily. 60 tablet 6  . spironolactone (ALDACTONE) 25 MG tablet Take 0.5 tablets (12.5 mg  total) by mouth daily. 45 tablet 3  . ticagrelor (BRILINTA) 90 MG TABS tablet Take 1 tablet (90 mg total) by mouth 2 (two) times daily. 60 tablet 11  . ezetimibe (ZETIA) 10 MG tablet Take 1 tablet (10 mg total) by mouth daily. (Patient not taking: Reported on 12/12/2019) 90 tablet 3   No current facility-administered medications for this visit.    Allergies:   Patient has no known allergies.    Social History:  The patient  reports that she has never smoked. She has never used smokeless tobacco. She reports current alcohol use. She reports that she does not use drugs.   Family History:  The patient's family history includes Diabetes in her father, mother, and sister; Heart disease in her mother; Hypertension in her sister.    ROS:  Please see the history of present illness.   Otherwise, review of systems are positive for none.   All other systems are reviewed and negative.    PHYSICAL EXAM: VS:  BP 110/82 (BP Location: Left Arm, Patient Position: Sitting, Cuff Size: Large)   Pulse (!) 54   Ht 5\' 4"  (1.626 m)   Wt 215 lb 12 oz (97.9 kg)   LMP 06/19/2017 (Approximate)   SpO2 97%   BMI 37.03 kg/m  , BMI Body mass index is 37.03 kg/m. GEN: Well nourished, well developed, in no acute distress  HEENT: normal  Neck: no JVD, carotid bruits, or masses Cardiac: RRR; no murmurs, rubs, or gallops,no edema  Respiratory:  clear to auscultation bilaterally, normal work of breathing GI: soft, nontender, nondistended, + BS MS: no deformity or atrophy  Skin: warm and dry, no rash Neuro:  Strength and sensation are intact Psych: euthymic mood, full affect   EKG:  EKG is ordered today. The ekg ordered today demonstrates sinus bradycardia with no significant ST or T wave changes   Recent Labs: 05/20/2019: B Natriuretic Peptide 137.5 05/24/2019: Magnesium 2.0 05/30/2019: Hemoglobin 10.1; Platelets 337.0 08/19/2019: BUN 14; Creatinine, Ser 0.56; Potassium 3.9; Sodium 140 12/09/2019: ALT 16; TSH  2.755    Lipid Panel    Component Value Date/Time   CHOL 140 12/09/2019 1420   TRIG 71 12/09/2019 1420   HDL 52 12/09/2019 1420   CHOLHDL 2.7 12/09/2019 1420   VLDL 14 12/09/2019 1420   LDLCALC 74 12/09/2019 1420      Wt Readings from Last 3 Encounters:  12/12/19 215 lb 12 oz (97.9 kg)  12/11/19 216 lb 3.2 oz (98.1 kg)  09/09/19 203 lb 4 oz (92.2 kg)       No flowsheet data found.    ASSESSMENT AND PLAN:  1.  Coronary artery disease involving native coronary arteries: She is doing very well with no anginal symptoms.  She complains of easy bruising but the plan is to continue dual antiplatelet therapy until August of this year.  After that, ticagrelor can be discontinued.  She should continue aspirin indefinitely.  2.  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  However, she is not able to afford Entresto.  I elected to switch her to losartan 50 mg daily.  3.  Thyroid disease: Managed by endocrinology.  4 essential hypertension: Blood pressure is well controlled  5.  Hyperlipidemia: Recent lipid profile showed an LDL of 74 and since then Zetia was added.    Disposition:   FU with me in 6 months  Signed,  Amber Bears, Amber [name]  12/12/2019 8:26 AM     [facility]

## 2019-12-12 NOTE — Patient Instructions (Signed)
Medication Instructions:  Your physician has recommended you make the following change in your medication:   1) STOP Entresto  2) START Losartan 50 mg daily. An Rx has been sent to your pharmacy.    *If you need a refill on your cardiac medications before your next appointment, please call your pharmacy*  Lab Work: None ordered If you have labs (blood work) drawn today and your tests are completely normal, you will receive your results only by: Marland Kitchen MyChart Message (if you have MyChart) OR . A paper copy in the mail If you have any lab test that is abnormal or we need to change your treatment, we will call you to review the results.  Testing/Procedures: None ordered  Follow-Up: At [facility], you and your health needs are our priority.  As part of our continuing mission to provide you with exceptional heart care, we have created designated Provider Care Teams.  These Care Teams include your primary Cardiologist (physician) and Advanced Practice Providers (APPs -  Physician Assistants and Nurse Practitioners) who all work together to provide you with the care you need, when you need it.  Your next appointment:   6 month(s)  The format for your next appointment:   In Person  Provider:    You may see Lorine Bears, MD or one of the following Advanced Practice Providers on your designated Care Team:    Nicolasa Ducking, NP  Eula Listen, PA-C  Marisue Ivan, PA-C   Other Instructions N/A

## 2019-12-13 ENCOUNTER — Ambulatory Visit: Payer: Commercial Managed Care - PPO | Attending: Ophthalmology

## 2019-12-20 ENCOUNTER — Other Ambulatory Visit: Payer: Self-pay | Admitting: Primary Care

## 2019-12-20 DIAGNOSIS — Z1231 Encounter for screening mammogram for malignant neoplasm of breast: Secondary | ICD-10-CM

## 2020-02-05 ENCOUNTER — Other Ambulatory Visit (INDEPENDENT_AMBULATORY_CARE_PROVIDER_SITE_OTHER): Payer: Commercial Managed Care - PPO

## 2020-02-05 ENCOUNTER — Telehealth: Payer: Self-pay | Admitting: Internal Medicine

## 2020-02-05 ENCOUNTER — Other Ambulatory Visit: Payer: Self-pay

## 2020-02-05 DIAGNOSIS — E0590 Thyrotoxicosis, unspecified without thyrotoxic crisis or storm: Secondary | ICD-10-CM

## 2020-02-05 LAB — TSH: TSH: 3.04 u[IU]/mL (ref 0.35–4.50)

## 2020-02-05 LAB — T4, FREE: Free T4: 0.66 ng/dL (ref 0.60–1.60)

## 2020-02-05 NOTE — Telephone Encounter (Addendum)
Attempted to call the pt to discuss results    I suggest cutting her dose from 10 mg to 5 mg daily ( depending on the strength she has )       Awaiting call back    Abby Raelyn Mora, MD  [facility] Endocrinology  [facility] [address] [place], [place] [address] Phone: [phone] FAX: [phone]    Addendum: No call back by 02/06/2020 , a letter will be sent

## 2020-02-06 ENCOUNTER — Encounter: Payer: Self-pay | Admitting: Internal Medicine

## 2020-02-06 MED ORDER — METHIMAZOLE 5 MG PO TABS: 5.0000 mg | ORAL_TABLET | Freq: Every day | ORAL | 6 refills | Status: DC

## 2020-02-12 ENCOUNTER — Observation Stay
Admission: EM | Admit: 2020-02-12 | Discharge: 2020-02-13 | Disposition: A | Discharge status: Home / Self Care | Payer: Commercial Managed Care - PPO | Attending: Internal Medicine | Admitting: Internal Medicine

## 2020-02-12 ENCOUNTER — Observation Stay (HOSPITAL_BASED_OUTPATIENT_CLINIC_OR_DEPARTMENT_OTHER)
Admit: 2020-02-12 | Discharge: 2020-02-12 | Disposition: A | Discharge status: Home / Self Care | Payer: Commercial Managed Care - PPO | Attending: Physician Assistant | Admitting: Physician Assistant

## 2020-02-12 ENCOUNTER — Other Ambulatory Visit: Payer: Self-pay

## 2020-02-12 ENCOUNTER — Emergency Department: Payer: Commercial Managed Care - PPO

## 2020-02-12 DIAGNOSIS — I2510 Atherosclerotic heart disease of native coronary artery without angina pectoris: Secondary | ICD-10-CM

## 2020-02-12 DIAGNOSIS — I25110 Atherosclerotic heart disease of native coronary artery with unstable angina pectoris: Principal | ICD-10-CM

## 2020-02-12 DIAGNOSIS — R079 Chest pain, unspecified: Secondary | ICD-10-CM | POA: Diagnosis not present

## 2020-02-12 DIAGNOSIS — D72829 Elevated white blood cell count, unspecified: Secondary | ICD-10-CM | POA: Insufficient documentation

## 2020-02-12 DIAGNOSIS — R7303 Prediabetes: Secondary | ICD-10-CM | POA: Diagnosis not present

## 2020-02-12 DIAGNOSIS — I252 Old myocardial infarction: Secondary | ICD-10-CM | POA: Insufficient documentation

## 2020-02-12 DIAGNOSIS — I200 Unstable angina: Secondary | ICD-10-CM

## 2020-02-12 DIAGNOSIS — E0500 Thyrotoxicosis with diffuse goiter without thyrotoxic crisis or storm: Secondary | ICD-10-CM | POA: Insufficient documentation

## 2020-02-12 DIAGNOSIS — Z79899 Other long term (current) drug therapy: Secondary | ICD-10-CM | POA: Insufficient documentation

## 2020-02-12 DIAGNOSIS — I110 Hypertensive heart disease with heart failure: Secondary | ICD-10-CM | POA: Insufficient documentation

## 2020-02-12 DIAGNOSIS — I509 Heart failure, unspecified: Secondary | ICD-10-CM

## 2020-02-12 DIAGNOSIS — E669 Obesity, unspecified: Secondary | ICD-10-CM | POA: Insufficient documentation

## 2020-02-12 DIAGNOSIS — I209 Angina pectoris, unspecified: Secondary | ICD-10-CM

## 2020-02-12 DIAGNOSIS — Z6838 Body mass index (BMI) 38.0-38.9, adult: Secondary | ICD-10-CM | POA: Insufficient documentation

## 2020-02-12 DIAGNOSIS — Z7982 Long term (current) use of aspirin: Secondary | ICD-10-CM | POA: Diagnosis not present

## 2020-02-12 DIAGNOSIS — I10 Essential (primary) hypertension: Secondary | ICD-10-CM | POA: Diagnosis not present

## 2020-02-12 DIAGNOSIS — R011 Cardiac murmur, unspecified: Secondary | ICD-10-CM

## 2020-02-12 DIAGNOSIS — I255 Ischemic cardiomyopathy: Secondary | ICD-10-CM | POA: Insufficient documentation

## 2020-02-12 DIAGNOSIS — Z955 Presence of coronary angioplasty implant and graft: Secondary | ICD-10-CM | POA: Insufficient documentation

## 2020-02-12 DIAGNOSIS — E0590 Thyrotoxicosis, unspecified without thyrotoxic crisis or storm: Secondary | ICD-10-CM | POA: Diagnosis not present

## 2020-02-12 DIAGNOSIS — E785 Hyperlipidemia, unspecified: Secondary | ICD-10-CM | POA: Diagnosis not present

## 2020-02-12 DIAGNOSIS — Z20822 Contact with and (suspected) exposure to covid-19: Secondary | ICD-10-CM | POA: Diagnosis not present

## 2020-02-12 DIAGNOSIS — I5023 Acute on chronic systolic (congestive) heart failure: Secondary | ICD-10-CM | POA: Diagnosis not present

## 2020-02-12 DIAGNOSIS — Z7902 Long term (current) use of antithrombotics/antiplatelets: Secondary | ICD-10-CM | POA: Insufficient documentation

## 2020-02-12 DIAGNOSIS — I5033 Acute on chronic diastolic (congestive) heart failure: Secondary | ICD-10-CM

## 2020-02-12 LAB — URINALYSIS, ROUTINE W REFLEX MICROSCOPIC
Bilirubin Urine: NEGATIVE
Glucose, UA: NEGATIVE mg/dL
Ketones, ur: NEGATIVE mg/dL
Leukocytes,Ua: NEGATIVE
Nitrite: NEGATIVE
Protein, ur: NEGATIVE mg/dL
Specific Gravity, Urine: 1.011 (ref 1.005–1.030)
pH: 5 (ref 5.0–8.0)

## 2020-02-12 LAB — ECHOCARDIOGRAM COMPLETE
Height: 64 in
Weight: 3600 oz

## 2020-02-12 LAB — CBC WITH DIFFERENTIAL/PLATELET
Abs Immature Granulocytes: 0.07 10*3/uL (ref 0.00–0.07)
Basophils Absolute: 0.1 10*3/uL (ref 0.0–0.1)
Basophils Relative: 1 %
Eosinophils Absolute: 0.3 10*3/uL (ref 0.0–0.5)
Eosinophils Relative: 1 %
HCT: 36.8 % (ref 36.0–46.0)
Hemoglobin: 12.9 g/dL (ref 12.0–15.0)
Immature Granulocytes: 0 %
Lymphocytes Relative: 27 %
Lymphs Abs: 4.8 10*3/uL — ABNORMAL HIGH (ref 0.7–4.0)
MCH: 27.8 pg (ref 26.0–34.0)
MCHC: 35.1 g/dL (ref 30.0–36.0)
MCV: 79.3 fL — ABNORMAL LOW (ref 80.0–100.0)
Monocytes Absolute: 1.4 10*3/uL — ABNORMAL HIGH (ref 0.1–1.0)
Monocytes Relative: 8 %
Neutro Abs: 11 10*3/uL — ABNORMAL HIGH (ref 1.7–7.7)
Neutrophils Relative %: 63 %
Platelets: 316 10*3/uL (ref 150–400)
RBC: 4.64 MIL/uL (ref 3.87–5.11)
RDW: 14.5 % (ref 11.5–15.5)
WBC: 17.6 10*3/uL — ABNORMAL HIGH (ref 4.0–10.5)
nRBC: 0 % (ref 0.0–0.2)

## 2020-02-12 LAB — FIBRIN DERIVATIVES D-DIMER (ARMC ONLY): Fibrin derivatives D-dimer (ARMC): 390.06 ng/mL (FEU) (ref 0.00–499.00)

## 2020-02-12 LAB — APTT: aPTT: 33 seconds (ref 24–36)

## 2020-02-12 LAB — PROTIME-INR
INR: 1 (ref 0.8–1.2)
Prothrombin Time: 12.3 seconds (ref 11.4–15.2)

## 2020-02-12 LAB — BASIC METABOLIC PANEL
Anion gap: 8 (ref 5–15)
BUN: 17 mg/dL (ref 6–20)
CO2: 26 mmol/L (ref 22–32)
Calcium: 8.7 mg/dL — ABNORMAL LOW (ref 8.9–10.3)
Chloride: 106 mmol/L (ref 98–111)
Creatinine, Ser: 0.89 mg/dL (ref 0.44–1.00)
GFR calc Af Amer: >60 mL/min (ref >60)
GFR calc non Af Amer: >60 mL/min (ref >60)
Glucose, Bld: 128 mg/dL — ABNORMAL HIGH (ref 70–99)
Potassium: 3.4 mmol/L — ABNORMAL LOW (ref 3.5–5.1)
Sodium: 140 mmol/L (ref 135–145)

## 2020-02-12 LAB — RESPIRATORY PANEL BY RT PCR (FLU A&B, COVID)
Influenza A by PCR: NEGATIVE
Influenza B by PCR: NEGATIVE
SARS Coronavirus 2 by RT PCR: NEGATIVE

## 2020-02-12 LAB — GLUCOSE, CAPILLARY: Glucose-Capillary: 142 mg/dL — ABNORMAL HIGH (ref 70–99)

## 2020-02-12 LAB — TROPONIN I (HIGH SENSITIVITY)
Troponin I (High Sensitivity): 10 ng/L (ref <18)
Troponin I (High Sensitivity): 10 ng/L (ref <18)

## 2020-02-12 LAB — HEPARIN LEVEL (UNFRACTIONATED)
Heparin Unfractionated: 0.33 IU/mL (ref 0.30–0.70)
Heparin Unfractionated: 0.29 IU/mL — ABNORMAL LOW (ref 0.30–0.70)

## 2020-02-12 LAB — BRAIN NATRIURETIC PEPTIDE: B Natriuretic Peptide: 31 pg/mL (ref 0.0–100.0)

## 2020-02-12 MED ORDER — NITROGLYCERIN 0.4 MG SL SUBL
0.4000 mg | SUBLINGUAL_TABLET | SUBLINGUAL | Status: DC | PRN
  Administered 2020-02-12 (×2): 0.4 mg via SUBLINGUAL
  Filled 2020-02-12 (×2): qty 1

## 2020-02-12 MED ORDER — FUROSEMIDE 10 MG/ML IJ SOLN
40.0000 mg | Freq: Two times a day (BID) | INTRAMUSCULAR | Status: DC
  Administered 2020-02-12 – 2020-02-13 (×2): 40 mg via INTRAVENOUS
  Filled 2020-02-12 (×3): qty 4

## 2020-02-12 MED ORDER — ONDANSETRON HCL 4 MG/2ML IJ SOLN: 4.0000 mg | Freq: Four times a day (QID) | INTRAMUSCULAR | Status: DC | PRN

## 2020-02-12 MED ORDER — MORPHINE SULFATE (PF) 2 MG/ML IV SOLN
2.0000 mg | INTRAVENOUS | Status: DC | PRN
  Administered 2020-02-12: 2 mg via INTRAVENOUS
  Filled 2020-02-12: qty 1

## 2020-02-12 MED ORDER — MORPHINE SULFATE (PF) 4 MG/ML IV SOLN
4.0000 mg | Freq: Once | INTRAVENOUS | Status: AC
  Administered 2020-02-12: 4 mg via INTRAVENOUS
  Filled 2020-02-12: qty 1

## 2020-02-12 MED ORDER — CALCIUM CARBONATE-VITAMIN D 500-200 MG-UNIT PO TABS
0.5000 | ORAL_TABLET | Freq: Two times a day (BID) | ORAL | Status: DC
  Administered 2020-02-12 – 2020-02-13 (×2): 0.5 via ORAL
  Filled 2020-02-12: qty 1
  Filled 2020-02-12: qty 0.5
  Filled 2020-02-12: qty 1

## 2020-02-12 MED ORDER — HEPARIN BOLUS VIA INFUSION
1200.0000 [IU] | Freq: Once | INTRAVENOUS | Status: AC
  Administered 2020-02-12: 1200 [IU] via INTRAVENOUS
  Filled 2020-02-12: qty 1200

## 2020-02-12 MED ORDER — METOPROLOL SUCCINATE ER 25 MG PO TB24
12.5000 mg | ORAL_TABLET | Freq: Every day | ORAL | Status: DC
  Administered 2020-02-13: 12.5 mg via ORAL
  Filled 2020-02-12: qty 0.5
  Filled 2020-02-12: qty 1

## 2020-02-12 MED ORDER — HEPARIN (PORCINE) 25000 UT/250ML-% IV SOLN
1250.0000 [IU]/h | INTRAVENOUS | Status: DC
  Administered 2020-02-12: 1100 [IU]/h via INTRAVENOUS
  Filled 2020-02-12 (×2): qty 250

## 2020-02-12 MED ORDER — ASPIRIN 81 MG PO CHEW
81.0000 mg | CHEWABLE_TABLET | Freq: Every day | ORAL | Status: DC
  Administered 2020-02-13: 81 mg via ORAL
  Filled 2020-02-12: qty 1

## 2020-02-12 MED ORDER — ACETAMINOPHEN 325 MG PO TABS
650.0000 mg | ORAL_TABLET | ORAL | Status: DC | PRN
  Administered 2020-02-12: 650 mg via ORAL
  Filled 2020-02-12: qty 2

## 2020-02-12 MED ORDER — ISOSORBIDE MONONITRATE ER 30 MG PO TB24
15.0000 mg | ORAL_TABLET | Freq: Every day | ORAL | Status: DC
  Administered 2020-02-12 – 2020-02-13 (×2): 15 mg via ORAL
  Filled 2020-02-12 (×2): qty 1

## 2020-02-12 MED ORDER — SODIUM CHLORIDE 0.9% FLUSH
10.0000 mL | Freq: Two times a day (BID) | INTRAVENOUS | Status: DC
  Administered 2020-02-12 – 2020-02-13 (×2): 10 mL via INTRAVENOUS

## 2020-02-12 MED ORDER — POTASSIUM CHLORIDE CRYS ER 20 MEQ PO TBCR
20.0000 meq | EXTENDED_RELEASE_TABLET | Freq: Two times a day (BID) | ORAL | Status: AC
  Administered 2020-02-12 (×2): 20 meq via ORAL
  Filled 2020-02-12 (×2): qty 1

## 2020-02-12 MED ORDER — TICAGRELOR 90 MG PO TABS
90.0000 mg | ORAL_TABLET | Freq: Two times a day (BID) | ORAL | Status: DC
  Administered 2020-02-12 – 2020-02-13 (×2): 90 mg via ORAL
  Filled 2020-02-12 (×3): qty 1

## 2020-02-12 MED ORDER — LOSARTAN POTASSIUM 50 MG PO TABS
50.0000 mg | ORAL_TABLET | Freq: Every day | ORAL | Status: DC
  Administered 2020-02-13: 50 mg via ORAL
  Filled 2020-02-12: qty 1

## 2020-02-12 MED ORDER — FUROSEMIDE 20 MG PO TABS: 20.0000 mg | ORAL_TABLET | Freq: Every day | ORAL | Status: DC

## 2020-02-12 MED ORDER — SPIRONOLACTONE 25 MG PO TABS
12.5000 mg | ORAL_TABLET | Freq: Every day | ORAL | Status: DC
  Administered 2020-02-13: 12.5 mg via ORAL
  Filled 2020-02-12 (×2): qty 0.5
  Filled 2020-02-12: qty 1

## 2020-02-12 MED ORDER — CALCIUM CITRATE-VITAMIN D 250-100 MG-UNIT PO TABS: 1.0000 | ORAL_TABLET | Freq: Two times a day (BID) | ORAL | Status: DC

## 2020-02-12 MED ORDER — FERROUS SULFATE 325 (65 FE) MG PO TABS
325.0000 mg | ORAL_TABLET | Freq: Every morning | ORAL | Status: DC
  Administered 2020-02-13: 325 mg via ORAL
  Filled 2020-02-12 (×2): qty 1

## 2020-02-12 MED ORDER — ONDANSETRON HCL 4 MG/2ML IJ SOLN
4.0000 mg | Freq: Once | INTRAMUSCULAR | Status: AC
  Administered 2020-02-12: 4 mg via INTRAVENOUS
  Filled 2020-02-12: qty 2

## 2020-02-12 MED ORDER — POTASSIUM CHLORIDE CRYS ER 20 MEQ PO TBCR
20.0000 meq | EXTENDED_RELEASE_TABLET | Freq: Every day | ORAL | Status: DC
  Administered 2020-02-13: 20 meq via ORAL
  Filled 2020-02-12: qty 1

## 2020-02-12 MED ORDER — METHIMAZOLE 5 MG PO TABS
5.0000 mg | ORAL_TABLET | Freq: Every day | ORAL | Status: DC
  Administered 2020-02-13: 5 mg via ORAL
  Filled 2020-02-12 (×2): qty 1

## 2020-02-12 MED ORDER — HEPARIN BOLUS VIA INFUSION
4000.0000 [IU] | Freq: Once | INTRAVENOUS | Status: AC
  Administered 2020-02-12: 4000 [IU] via INTRAVENOUS
  Filled 2020-02-12: qty 4000

## 2020-02-12 MED ORDER — ATORVASTATIN CALCIUM 80 MG PO TABS
80.0000 mg | ORAL_TABLET | Freq: Every day | ORAL | Status: DC
  Administered 2020-02-12: 80 mg via ORAL
  Filled 2020-02-12: qty 1

## 2020-02-12 NOTE — Progress Notes (Signed)
ANTICOAGULATION CONSULT NOTE - Initial Consult  Pharmacy Consult for Heparin Indication: chest pain/ACS  No Known Allergies  Patient Measurements: Height: 5\' 4"  (162.6 cm) Weight: 102.1 kg (225 lb) IBW/kg (Calculated) : 54.7 HEPARIN DW (KG): 78.5  Vital Signs: Temp: 99.2 F (37.3 C) (04/28 2009) Temp Source: Oral (04/28 2009) BP: 122/62 (04/28 2009) Pulse Rate: 92 (04/28 2009)  Labs: Recent Labs    02/12/20 0413 02/12/20 0711 02/12/20 1408 02/12/20 1944  HGB 12.9  --   --   --   HCT 36.8  --   --   --   PLT 316  --   --   --   APTT 33  --   --   --   LABPROT 12.3  --   --   --   INR 1.0  --   --   --   HEPARINUNFRC  --   --  0.33 0.29*  CREATININE 0.89  --   --   --   TROPONINIHS 10 10  --   --     Estimated Creatinine Clearance: 89 mL/min (by C-G formula based on SCr of 0.89 mg/dL).   Medical History: Past Medical History:  Diagnosis Date  . Acute encephalopathy   . Anemia   . CAD (coronary artery disease)   . HFrEF (heart failure with reduced ejection fraction) ([place])   . HTN (hypertension)   . Hyperthyroidism   . 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 Thyroid storm     Medications:  Medications Prior to Admission  Medication Sig Dispense Refill Last Dose  . aspirin 81 MG chewable tablet Chew 1 tablet (81 mg total) by mouth daily. 30 tablet 11 02/11/2020 at 1000  . atorvastatin (LIPITOR) 80 MG tablet Take 1 tablet (80 mg total) by mouth daily at 6 PM. 30 tablet 11 02/11/2020 at 1800  . calcium-vitamin D 250-100 MG-UNIT tablet Take 1 tablet by mouth 2 (two) times daily.   02/11/2020 at 1000  . ferrous sulfate 325 (65 FE) MG tablet Take 325 mg by mouth every morning.    02/11/2020 at 1000  . furosemide (LASIX) 20 MG tablet Take 1 tablet (20 mg total) by mouth daily. 90 tablet 3 02/11/2020 at 1000  . losartan (COZAAR) 50 MG tablet Take 1 tablet (50 mg total) by mouth daily. 90 tablet 3 02/11/2020 at 1000  . methimazole (TAPAZOLE) 5 MG tablet Take 1 tablet (5 mg  total) by mouth daily. 30 tablet 6 02/11/2020 at 1000  . metoprolol succinate (TOPROL XL) 25 MG 24 hr tablet Take 0.5 tablets (12.5 mg total) by mouth daily. 45 tablet 3 02/11/2020 at Unknown time  . nitroGLYCERIN (NITROSTAT) 0.4 MG SL tablet Place 1 tablet (0.4 mg total) under the tongue every 5 (five) minutes as needed for chest pain. 100 tablet 3 PRN at PRN  . potassium chloride SA (K-DUR) 20 MEQ tablet Take 1 tablet (20 mEq total) by mouth daily. 90 tablet 3 02/11/2020 at 1000  . spironolactone (ALDACTONE) 25 MG tablet Take 0.5 tablets (12.5 mg total) by mouth daily. 45 tablet 3 02/11/2020 at 1000  . ticagrelor (BRILINTA) 90 MG TABS tablet Take 1 tablet (90 mg total) by mouth 2 (two) times daily. 60 tablet 11 02/11/2020 at 1000    Assessment: Asked to start Heparin for ACS.  No anticoagulants PTA per med list.  Baseline labs ordered.  4/28 1408 HL 0.33   Goal of Therapy:  Heparin level 0.3-0.7 units/ml Monitor platelets by anticoagulation  protocol: Yes   Plan:  Heparin level is therapeutic. Will continue heparin infusion at 1100 units/hr. Check HL in 6 hours. CBC daily.   4/28:  HL @ 1944 = 0.29 Will order heparin 1200 units IV x 1 bolus and increase drip rate to 1250 units/hr. Will recheck HL 6 hrs after rate change.   Calissa Swenor D, PharmD 02/12/2020,9:05 PM

## 2020-02-12 NOTE — Progress Notes (Signed)
ANTICOAGULATION CONSULT NOTE - Initial Consult  Pharmacy Consult for Heparin Indication: chest pain/ACS  No Known Allergies  Patient Measurements: Height: 5\' 4"  (162.6 cm) Weight: 102.1 kg (225 lb) IBW/kg (Calculated) : 54.7 HEPARIN DW (KG): 78.5  Vital Signs: Temp: 99.1 F (37.3 C) (04/28 1459) Temp Source: Oral (04/28 1459) BP: 141/86 (04/28 1459) Pulse Rate: 114 (04/28 1459)  Labs: Recent Labs    02/12/20 0413 02/12/20 0711 02/12/20 1408  HGB 12.9  --   --   HCT 36.8  --   --   PLT 316  --   --   APTT 33  --   --   LABPROT 12.3  --   --   INR 1.0  --   --   HEPARINUNFRC  --   --  0.33  CREATININE 0.89  --   --   TROPONINIHS 10 10  --     Estimated Creatinine Clearance: 89 mL/min (by C-G formula based on SCr of 0.89 mg/dL).   Medical History: Past Medical History:  Diagnosis Date  . Acute encephalopathy   . Anemia   . CAD (coronary artery disease)   . HFrEF (heart failure with reduced ejection fraction) ([place])   . HTN (hypertension)   . Hyperthyroidism   . 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 Thyroid storm     Medications:  Medications Prior to Admission  Medication Sig Dispense Refill Last Dose  . aspirin 81 MG chewable tablet Chew 1 tablet (81 mg total) by mouth daily. 30 tablet 11 02/11/2020 at 1000  . atorvastatin (LIPITOR) 80 MG tablet Take 1 tablet (80 mg total) by mouth daily at 6 PM. 30 tablet 11 02/11/2020 at 1800  . calcium-vitamin D 250-100 MG-UNIT tablet Take 1 tablet by mouth 2 (two) times daily.   02/11/2020 at 1000  . ferrous sulfate 325 (65 FE) MG tablet Take 325 mg by mouth every morning.    02/11/2020 at 1000  . furosemide (LASIX) 20 MG tablet Take 1 tablet (20 mg total) by mouth daily. 90 tablet 3 02/11/2020 at 1000  . losartan (COZAAR) 50 MG tablet Take 1 tablet (50 mg total) by mouth daily. 90 tablet 3 02/11/2020 at 1000  . methimazole (TAPAZOLE) 5 MG tablet Take 1 tablet (5 mg total) by mouth daily. 30 tablet 6 02/11/2020 at 1000  .  metoprolol succinate (TOPROL XL) 25 MG 24 hr tablet Take 0.5 tablets (12.5 mg total) by mouth daily. 45 tablet 3 02/11/2020 at Unknown time  . nitroGLYCERIN (NITROSTAT) 0.4 MG SL tablet Place 1 tablet (0.4 mg total) under the tongue every 5 (five) minutes as needed for chest pain. 100 tablet 3 PRN at PRN  . potassium chloride SA (K-DUR) 20 MEQ tablet Take 1 tablet (20 mEq total) by mouth daily. 90 tablet 3 02/11/2020 at 1000  . spironolactone (ALDACTONE) 25 MG tablet Take 0.5 tablets (12.5 mg total) by mouth daily. 45 tablet 3 02/11/2020 at 1000  . ticagrelor (BRILINTA) 90 MG TABS tablet Take 1 tablet (90 mg total) by mouth 2 (two) times daily. 60 tablet 11 02/11/2020 at 1000    Assessment: Asked to start Heparin for ACS.  No anticoagulants PTA per med list.  Baseline labs ordered.  4/28 1408 HL 0.33   Goal of Therapy:  Heparin level 0.3-0.7 units/ml Monitor platelets by anticoagulation protocol: Yes   Plan:  Heparin level is therapeutic. Will continue heparin infusion at 1100 units/hr. Check HL in 6 hours. CBC daily.   [employer]  Eliane Decree, PharmD, BCPS 02/12/2020,3:35 PM

## 2020-02-12 NOTE — Plan of Care (Signed)

## 2020-02-12 NOTE — ED Notes (Signed)
Ready bed @0900 , patient going to room 243

## 2020-02-12 NOTE — ED Notes (Signed)
IV team at bedside 

## 2020-02-12 NOTE — ED Triage Notes (Signed)
Pt arrives to ED from home via [facility] with c/c of left side chest pain x24 hours. EMS reports transport vitals of 186/70, p80 NSR, 96% on room air, CBG132, temp 97.8. 20G placed in left hand. Pt given 324mg  aspirin and .04mg  nitro. Upon arrival, pt A&Ox4, NAD, no respiratory Sx noted.

## 2020-02-12 NOTE — H&P (Signed)
History and Physical    Amber [name] [id number]:[id number] DOB: 07-15-1971 DOA: 02/12/2020  PCP: [name], NP   Patient coming from: Home  I have personally briefly reviewed patient's old medical records in [place]  Chief Complaint: Chest pain  HPI: Amber [name] is a 49 y.o. female with medical history significant for significant two-vessel coronary artery disease with 80% mid LAD stenosis, occluded OM branches and moderate RCA disease status post successful angioplasty and drug-eluting stent placement to mid LAD (05/2019), ischemic cardiomyopathy with an LVEF of 40 to 50%, hypertension, Graves' disease and dyslipidemia who presents to the emergency room for evaluation of chest pain. Patient reports that her pain started 2 days prior to her admission and initially only involved the left arm but over the last 24 hours it has moved to the left side of her chest. Pain has been constant, pressure-like with radiation down her left arm. She reports her pain an 8 x 10 in intensity at its worst. She denies having any associated shortness of breath, no diaphoresis, no nausea or vomiting. She denies having any dizziness, no cough, no fever, no abdominal pain or changes in her bowel habits. Patient has received 2 sublingual nitroglycerin tablets without any significant improvement in her pain. She received a full dose aspirin per EMS. Per patient the pain is identical to her heart attack about a year ago. She has no acute EKG findings and initial troponin is negative   ED Course: Patient with significant cardiac history status post recent stent angioplasty and currently on Brilinta who presents to the emergency room with complaints of chest pain similar to her episode of non-ST elevation MI. Pain improved with IV morphine in the emergency room but not completely resolved. Initial troponin was negative. Patient was started on IV heparin and will be admitted to the hospital for further  evaluation  Review of Systems: As per HPI otherwise 10 point review of systems negative.    Past Medical History:  Diagnosis Date  . Acute encephalopathy   . Anemia   . CAD (coronary artery disease)   . HFrEF (heart failure with reduced ejection fraction) ([place])   . HTN (hypertension)   . Hyperthyroidism   . 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 Thyroid storm     Past Surgical History:  Procedure Laterality Date  . CARDIAC CATHETERIZATION    . CESAREAN SECTION     x2  . CORONARY STENT INTERVENTION N/A 05/22/2019   Procedure: CORONARY STENT INTERVENTION;  Surgeon: Wellington Hampshire, MD;  Location: [facility];  Service: Cardiovascular;  Laterality: N/A;  . MASS EXCISION Right 01/18/2013   Procedure: EXCISION right scapular cyst;  Surgeon: Ralene Ok, MD;  Location: [facility];  Service: General;  Laterality: Right;  . RIGHT/LEFT HEART CATH AND CORONARY ANGIOGRAPHY N/A 05/22/2019   Procedure: RIGHT/LEFT HEART CATH AND CORONARY ANGIOGRAPHY;  Surgeon: Wellington Hampshire, MD;  Location: [facility];  Service: Cardiovascular;  Laterality: N/A;     reports that she has never smoked. She has never used smokeless tobacco. She reports current alcohol use. She reports that she does not use drugs.  No Known Allergies  Family History  Problem Relation Age of Onset  . Diabetes Father   . Diabetes Mother   . Heart disease Mother   . Diabetes Sister   . Hypertension Sister      Prior to Admission medications   Medication Sig Start Date End Date Taking? Authorizing Provider  aspirin 81 MG chewable tablet Chew 1 tablet (81 mg total) by mouth daily. 06/06/19   Sondra Barges, PA-C  atorvastatin (LIPITOR) 80 MG tablet Take 1 tablet (80 mg total) by mouth daily at 6 PM. 06/21/19   Dunn, Raymon Mutton, PA-C  calcium-vitamin D 250-100 MG-UNIT tablet Take 1 tablet by mouth 2 (two) times daily.    [provider]  ezetimibe (ZETIA) 10 MG tablet Take 1 tablet (10 mg total) by mouth  daily. Patient not taking: Reported on 12/12/2019 10/22/19 01/20/20  Creig Hines, NP  ferrous sulfate 325 (65 FE) MG tablet Take 325 mg by mouth every morning.     [provider]  furosemide (LASIX) 20 MG tablet Take 1 tablet (20 mg total) by mouth daily. 06/06/19 12/12/19  Sondra Barges, PA-C  losartan (COZAAR) 50 MG tablet Take 1 tablet (50 mg total) by mouth daily. 12/12/19 03/11/20  Iran Ouch, MD  methimazole (TAPAZOLE) 5 MG tablet Take 1 tablet (5 mg total) by mouth daily. 02/06/20   Shamleffer, Konrad Dolores, MD  metoprolol succinate (TOPROL XL) 25 MG 24 hr tablet Take 0.5 tablets (12.5 mg total) by mouth daily. 08/09/19   Dunn, Raymon Mutton, PA-C  nitroGLYCERIN (NITROSTAT) 0.4 MG SL tablet Place 1 tablet (0.4 mg total) under the tongue every 5 (five) minutes as needed for chest pain. 06/06/19 06/05/20  Sondra Barges, PA-C  potassium chloride SA (K-DUR) 20 MEQ tablet Take 1 tablet (20 mEq total) by mouth daily. 06/13/19   Creig Hines, NP  spironolactone (ALDACTONE) 25 MG tablet Take 0.5 tablets (12.5 mg total) by mouth daily. 08/09/19 12/11/28  Sondra Barges, PA-C  ticagrelor (BRILINTA) 90 MG TABS tablet Take 1 tablet (90 mg total) by mouth 2 (two) times daily. 06/06/19   Sondra Barges, PA-C    Physical Exam: Vitals:   02/12/20 0355 02/12/20 0356 02/12/20 0500  BP: (!) 164/89  (!) 146/86  Pulse: 78  67  Resp: 19  18  SpO2: 100%  98%  Weight:  102.1 kg   Height:  5\' 4"  (1.626 m)      Vitals:   02/12/20 0355 02/12/20 0356 02/12/20 0500  BP: (!) 164/89  (!) 146/86  Pulse: 78  67  Resp: 19  18  SpO2: 100%  98%  Weight:  102.1 kg   Height:  5\' 4"  (1.626 m)     Constitutional: NAD, alert and oriented x 3. Appears uncomfortable Eyes: PERRL, lids and conjunctivae normal, exophthalmos  ENMT: Mucous membranes are moist.  Neck: normal, supple, no masses, no thyromegaly Respiratory: clear to auscultation bilaterally, no wheezing, no crackles. Normal respiratory  effort. No accessory muscle use.  Cardiovascular: Regular rate and rhythm, no murmurs / rubs / gallops. No extremity edema. 2+ pedal pulses. No carotid bruits.  Abdomen: no tenderness, no masses palpated. No hepatosplenomegaly. Bowel sounds positive.  Musculoskeletal: no clubbing / cyanosis. No joint deformity upper and lower extremities.  Skin: no rashes, lesions, ulcers.  Neurologic: No gross focal neurologic deficit. Psychiatric: Normal mood and affect.   Labs on Admission: I have personally reviewed following labs and imaging studies  CBC: Recent Labs  Lab 02/12/20 0413  WBC 17.6*  NEUTROABS 11.0*  HGB 12.9  HCT 36.8  MCV 79.3*  PLT 316   Basic Metabolic Panel: Recent Labs  Lab 02/12/20 0413  NA 140  K 3.4*  CL 106  CO2 26  GLUCOSE 128*  BUN 17  CREATININE 0.89  CALCIUM 8.7*   GFR: Estimated Creatinine Clearance: 89 mL/min (by C-G formula based on SCr of 0.89 mg/dL). Liver Function Tests: No results for input(s): AST, ALT, ALKPHOS, BILITOT, PROT, ALBUMIN in the last 168 hours. No results for input(s): LIPASE, AMYLASE in the last 168 hours. No results for input(s): AMMONIA in the last 168 hours. Coagulation Profile: Recent Labs  Lab 02/12/20 0413  INR 1.0   Cardiac Enzymes: No results for input(s): CKTOTAL, CKMB, CKMBINDEX, TROPONINI in the last 168 hours. BNP (last 3 results) No results for input(s): PROBNP in the last 8760 hours. HbA1C: No results for input(s): HGBA1C in the last 72 hours. CBG: No results for input(s): GLUCAP in the last 168 hours. Lipid Profile: No results for input(s): CHOL, HDL, LDLCALC, TRIG, CHOLHDL, LDLDIRECT in the last 72 hours. Thyroid Function Tests: No results for input(s): TSH, T4TOTAL, FREET4, T3FREE, THYROIDAB in the last 72 hours. Anemia Panel: No results for input(s): VITAMINB12, FOLATE, FERRITIN, TIBC, IRON, RETICCTPCT in the last 72 hours. Urine analysis: No results found for: COLORURINE, APPEARANCEUR, LABSPEC,  PHURINE, GLUCOSEU, HGBUR, BILIRUBINUR, KETONESUR, PROTEINUR, UROBILINOGEN, NITRITE, LEUKOCYTESUR  Radiological Exams on Admission: DG Chest Portable 1 View  Result Date: 02/12/2020 CLINICAL DATA:  Chest pain EXAM: PORTABLE CHEST 1 VIEW COMPARISON:  05/23/2019 FINDINGS: Stable generous heart size. Negative aortic contours. Minor scarring atelectasis at the right base. There is no edema, consolidation, effusion, or pneumothorax. IMPRESSION: No active disease. Electronically Signed   By: Marnee Spring M.D.   On: 02/12/2020 04:26    EKG: Independently reviewed.  Sinus rhythm No acute ST-T wave changes  Assessment/Plan Principal Problem:   Unstable angina (HCC) Active Problems:   Essential hypertension   Hyperthyroidism   CAD (coronary artery disease), native coronary artery     Unstable angina In a patient with known two-vessel coronary artery disease status post recent stent angioplasty with drug-eluting stent to the mid LAD We will cycle cardiac enzymes Continue heparin drip Continue beta-blockers, statin and aspirin We will request cardiology consult   Hypertension Continue losartan and metoprolol   Hyperthyroidism  Continue methimazole    Ischemic cardiomyopathy Last known LVEF 40 to 45%  Continue metoprolol, losartan and spironolactone   Obesity (BMI 38) Complicates overall prognosis and care   DVT prophylaxis: Heparin Code Status: Full Family Communication: Greater than 50% of time was spent discussing plan of care with patient at the bedside. She verbalizes understanding and agrees with the plan Disposition Plan: Back to previous home environment Consults called: Cardiology    Glender Augusta MD [facility]     02/12/2020, 7:45 AM

## 2020-02-12 NOTE — Progress Notes (Signed)
ANTICOAGULATION CONSULT NOTE - Initial Consult  Pharmacy Consult for Heparin Indication: chest pain/ACS  No Known Allergies  Patient Measurements: Height: 5\' 4"  (162.6 cm) Weight: 102.1 kg (225 lb) IBW/kg (Calculated) : 54.7 HEPARIN DW (KG): 78.5  Vital Signs: BP: 146/86 (04/28 0500) Pulse Rate: 67 (04/28 0500)  Labs: Recent Labs    02/12/20 0413  HGB 12.9  HCT 36.8  PLT 316  CREATININE 0.89  TROPONINIHS 10    Estimated Creatinine Clearance: 89 mL/min (by C-G formula based on SCr of 0.89 mg/dL).   Medical History: Past Medical History:  Diagnosis Date  . Acute encephalopathy   . Anemia   . CAD (coronary artery disease)   . HFrEF (heart failure with reduced ejection fraction) ([facility])   . HTN (hypertension)   . Hyperthyroidism   . 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 Thyroid storm     Medications:  (Not in [name] hospital admission)   Assessment: Asked to start Heparin for ACS.  No anticoagulants PTA per med list.  Baseline labs ordered.  Goal of Therapy:  Heparin level 0.3-0.7 units/ml Monitor platelets by anticoagulation protocol: Yes   Plan:  Heparin 4000 units bolus now x 1 then infusion at 1100 units/hr Check HL in 6 hours  Amber [name], Amber [name] 02/12/2020,6:47 AM

## 2020-02-12 NOTE — Consult Note (Signed)
Cardiology Consultation:   Patient ID: Amber [name] MRN: [id number]; DOB: [birth year]  Admit date: 02/12/2020 Date of Consult: 02/12/2020  Primary Care Provider: Doreene Nest, NP Primary Cardiologist: Lorine Bears, MD  Primary Electrophysiologist:  None    Patient Profile:   Amber [name] is a 49 y.o. female with a hx of CAD s/p PCI to mLAD (05/2019), HFrEF (EF 40-45%), HTN, hypothyroidism with previous thyroid storm, HTN, HLD, anemia, and obesity, and who is being seen today for the evaluation of unstable angina at the request of Dr. Joylene Igo.  History of Present Illness:   Amber [name] is a 49 year old female with history of CAD, chronic systolic heart failure, essential hypertension, hypothyroidism with previous history of thyroid storm, anemia, and obesity.  Amber [name] is followed by [facility] cardiology.  Amber [name] was diagnosed with severe hyperthyroidism in August 2020.  Amber [name] was hospitalized at that time with acute respiratory failure, requiring mechanical ventilation.  Amber [name] also had a non-ST elevation myocardial infarction and pulmonary edema.    05/2019 Echo showed EF 25 to 30% with wall motion abnormalities, suggestive of Takotsubo cardiomyopathy.  R/LHC showed two-vessel CAD with 80% stenosis of the mLAD, occluded OM branches, and moderate RCA disease.  PCI and DES was done to the mLAD.  Heart failure medications were optimized and hypothyroidism treated.  07/2019 echo showed EF 40-45%.  Amber [name] was last seen in office 12/12/2019 by Amber [name] primary cardiologist.  At that time, Amber [name] was doing very well with no chest pain or shortness of breath.  Amber [name] denied any other symptoms of heart failure.  Amber [name] was taking Amber [name] medications regularly.  Amber [name] did note easy bruising.  Amber [name] is having a hard time affording Entresto with this medication discontinued and started on losartan.  Amber [name] did note some weight gain, suspecting this may be Amber [name] becoming hypothyroid.    On 02/10/2020, Amber [name] reported some left shoulder discomfort  that started at rest and became worse with any movement. Amber [name] also noted bilateral upper extremity paraesthesias; however, Amber [name] did clarify that this only occurred for a short time after waking. By Tuesday, 02/11/2020, this left shoulder discomfort had moved into Amber [name] anterior chest and did not subside. CP was described as pleuritic, constant, severe, and associated with radiating left arm pain and SOB.  It was made worse with movement.  Amber [name] tried 1 sublingual nitroglycerin without relief.  Amber [name] was telling Amber [name] daughter about Amber [name] symptoms when Amber [name] daughter recommended Amber [name] present to the emergency department.  Amber [name] reported that this chest pain was identical to that experienced before Amber [name] 05/2019 non-ST elevation myocardial infarction, which worried Amber [name].  Amber [name] was taking Amber [name] medications as prescribed (though ASA twice daily with recommendation today to take this only once daily); therefore, Amber [name] was hopeful that it was not Amber [name] heart. Amber [name] was still having adequate urine output on Amber [name] lasix but reported orthopnea and shortness of breath. No lower extremity edema, abdominal distention, or early satiety.  Amber [name] denied any signs and symptoms of bleeding and continued to report bruising on DAPT (was taking ASA BID and advised to take qd today with Brilinta BID), as well as occasional / small amount of epistasis with blowing Amber [name] nose.  In the ED, vitals significant for BP 164/89, heart rate 74 bpm, 100% on room air.  Labs significant for sodium 140, potassium 3.4, glucose 128, creatinine 0.89, BUN 17, BNP 31.0, HS Tn 10  10, WBC 17.6, Hgb 12.9, HCT 36.8, Ddimer 390.06 .  COVID-19 and influenza negative.  Chest x-ray showed stable  cardiomegaly with minor scarring/atelectasis of the right base and no edema, consolidation, effusion, or pneumothorax. EKG showed NSR, 69bpm, anterior septal infarct, and new / worsening changes in the inferior leads but no acute ST/T changes.   Heart Pathway Score:     Past Medical History:  Diagnosis  Date  . Acute encephalopathy   . Anemia   . CAD (coronary artery disease)   . HFrEF (heart failure with reduced ejection fraction) (HCC)   . HTN (hypertension)   . Hyperthyroidism   . 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 Thyroid storm     Past Surgical History:  Procedure Laterality Date  . CARDIAC CATHETERIZATION    . CESAREAN SECTION     x2  . CORONARY STENT INTERVENTION N/A 05/22/2019   Procedure: CORONARY STENT INTERVENTION;  Surgeon: Iran Ouch, MD;  Location: [facility] INVASIVE CV LAB;  Service: Cardiovascular;  Laterality: N/A;  . MASS EXCISION Right 01/18/2013   Procedure: EXCISION right scapular cyst;  Surgeon: Axel Filler, MD;  Location: [facility] ORS;  Service: General;  Laterality: Right;  . RIGHT/LEFT HEART CATH AND CORONARY ANGIOGRAPHY N/A 05/22/2019   Procedure: RIGHT/LEFT HEART CATH AND CORONARY ANGIOGRAPHY;  Surgeon: Iran Ouch, MD;  Location: [facility] INVASIVE CV LAB;  Service: Cardiovascular;  Laterality: N/A;     Home Medications:  Prior to Admission medications   Medication Sig Start Date End Date Taking? Authorizing Provider  aspirin 81 MG chewable tablet Chew 1 tablet (81 mg total) by mouth daily. 06/06/19  Yes Dunn, Raymon Mutton, PA-C  atorvastatin (LIPITOR) 80 MG tablet Take 1 tablet (80 mg total) by mouth daily at 6 PM. 06/21/19  Yes Dunn, Raymon Mutton, PA-C  calcium-vitamin D 250-100 MG-UNIT tablet Take 1 tablet by mouth 2 (two) times daily.   Yes [provider]  ferrous sulfate 325 (65 FE) MG tablet Take 325 mg by mouth every morning.    Yes [provider]  furosemide (LASIX) 20 MG tablet Take 1 tablet (20 mg total) by mouth daily. 06/06/19 02/12/20 Yes Dunn, Raymon Mutton, PA-C  losartan (COZAAR) 50 MG tablet Take 1 tablet (50 mg total) by mouth daily. 12/12/19 03/11/20 Yes Iran Ouch, MD  methimazole (TAPAZOLE) 5 MG tablet Take 1 tablet (5 mg total) by mouth daily. 02/06/20  Yes Shamleffer, Konrad Dolores, MD  metoprolol succinate (TOPROL XL) 25 MG 24 hr  tablet Take 0.5 tablets (12.5 mg total) by mouth daily. 08/09/19  Yes Dunn, Raymon Mutton, PA-C  nitroGLYCERIN (NITROSTAT) 0.4 MG SL tablet Place 1 tablet (0.4 mg total) under the tongue every 5 (five) minutes as needed for chest pain. 06/06/19 06/05/20 Yes Dunn, Raymon Mutton, PA-C  potassium chloride SA (K-DUR) 20 MEQ tablet Take 1 tablet (20 mEq total) by mouth daily. 06/13/19  Yes Creig Hines, NP  spironolactone (ALDACTONE) 25 MG tablet Take 0.5 tablets (12.5 mg total) by mouth daily. 08/09/19 12/11/28 Yes Dunn, Raymon Mutton, PA-C  ticagrelor (BRILINTA) 90 MG TABS tablet Take 1 tablet (90 mg total) by mouth 2 (two) times daily. 06/06/19  Yes Sondra Barges, PA-C    Inpatient Medications: Scheduled Meds: . aspirin  81 mg Oral Daily  . atorvastatin  80 mg Oral q1800  . calcium-vitamin D  0.5 tablet Oral BID  . ferrous sulfate  325 mg Oral q morning - 10a  . furosemide  20 mg Oral Daily  . losartan  50 mg Oral Daily  . methimazole  5 mg Oral Daily  . metoprolol succinate  12.5 mg Oral Daily  . potassium chloride SA  20 mEq Oral Daily  . potassium chloride  20 mEq Oral BID  . spironolactone  12.5 mg Oral Daily  . ticagrelor  90 mg Oral BID   Continuous Infusions: . heparin 1,100 Units/hr (02/12/20 0735)   PRN Meds: acetaminophen, morphine injection, nitroGLYCERIN, ondansetron (ZOFRAN) IV  Allergies:   No Known Allergies  Social History:   Social History   Socioeconomic History  . Marital status: Married    Spouse name: Not on file  . Number of children: 2  . Years of education: Not on file  . Highest education level: Not on file  Occupational History    Comment: front desk   Tobacco Use  . Smoking status: Never Smoker  . Smokeless tobacco: Never Used  Substance and Sexual Activity  . Alcohol use: Yes    Alcohol/week: 0.0 standard drinks    Comment: rare  . Drug use: No  . Sexual activity: Not on file  Other Topics Concern  . Not on file  Social History Narrative   From  [place]   Married.   2 children.   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   Social Determinants of Health   Financial Resource Strain:   . Difficulty of Paying Living Expenses:   Food Insecurity:   . Worried About Charity fundraiser in the Last Year:   . Arboriculturist in the Last Year:   Transportation Needs:   . Film/video editor (Medical):   Marland Kitchen Lack of Transportation (Non-Medical):   Physical Activity:   . Days of Exercise per Week:   . Minutes of Exercise per Session:   Stress:   . Feeling of Stress :   Social Connections:   . Frequency of Communication with Friends and Family:   . Frequency of Social Gatherings with Friends and Family:   . Attends Religious Services:   . Active Member of Clubs or Organizations:   . Attends Archivist Meetings:   Marland Kitchen Marital Status:   Intimate Partner Violence:   . Fear of Current or Ex-Partner:   . Emotionally Abused:   Marland Kitchen Physically Abused:   . Sexually Abused:     Family History:    Family History  Problem Relation Age of Onset  . Diabetes Father   . Diabetes Mother   . Heart disease Mother   . Diabetes Sister   . Hypertension Sister      ROS:  Please see the history of present illness.  Review of Systems  Constitutional: Positive for malaise/fatigue.  Respiratory: Positive for shortness of breath. Negative for cough, hemoptysis and sputum production.   Cardiovascular: Positive for chest pain and orthopnea. Negative for palpitations, leg swelling and PND.  Gastrointestinal: Negative for abdominal pain, blood in stool, melena, nausea and vomiting.  Genitourinary: Negative for hematuria.  Musculoskeletal: Negative for falls.  Neurological: Negative for dizziness and loss of consciousness.  All other systems reviewed and are negative.   All other ROS reviewed and negative.     Physical Exam/Data:   Vitals:   02/12/20 0700 02/12/20 0908 02/12/20  0939 02/12/20 1120  BP: (!) 147/83 (!) 148/80 (!) 155/94 133/75  Pulse: 73 86 91 (!) 105  Resp: 20 (!) 23 18 19   Temp:   98.6 F (37 C) 100 F (37.8 C)  TempSrc:   Oral Oral  SpO2: 99% 98%  100% 95%  Weight:      Height:       No intake or output data in the 24 hours ending 02/12/20 1236 Last 3 Weights 02/12/2020 12/12/2019 12/11/2019  Weight (lbs) 225 lb 215 lb 12 oz 216 lb 3.2 oz  Weight (kg) 102.059 kg 97.864 kg 98.068 kg     Body mass index is 38.62 kg/m.  General:  Well nourished, well developed, in no acute distress.  HEENT: normal Lymph: no adenopathy Neck: JVD difficult to assess due to body habitus Endocrine: Periocular changes consistent with Graves' disease Vascular: No carotid bruits; FA pulses 1+ bilaterally without bruits  Cardiac:  normal S1, S2; tachycardic but regular; 1/6 holosystolic murmur Lungs: Right-sided crackles at the base. no wheezing, rhonchi or rales  Abd: Somewhat distended, no hepatomegaly  Ext: no edema Musculoskeletal:  No deformities, BUE and BLE strength normal and equal Skin: warm and dry  Neuro:  CNs 2-12 intact, no focal abnormalities noted Psych:  Normal affect   EKG:  The EKG was personally reviewed and demonstrates:  EKG showed NSR, 69bpm, anterior septal infarct, and new / worsening changes in the inferior leads but no acute ST/T changes. Telemetry:  Telemetry was personally reviewed and demonstrates:  ST with rates in low 100s-120s  Relevant CV Studies: Echocardiogram 08/08/2019 1. Left ventricular ejection fraction, by visual estimation, is 40 to  45%. The left ventricle has severely decreased function. Mildly increased  left ventricular size. There is no left ventricular hypertrophy.  2. Left ventricular diastolic Doppler parameters are consistent with  pseudonormalization pattern of LV diastolic filling.  3. Global right ventricle has normal systolic function.The right  ventricular size is normal. No increase in right  ventricular wall  thickness.  4. Left atrial size was mildly dilated.  5. Mildly elevated pulmonary artery systolic pressure.   LHC 05/22/2019  1st Mrg lesion is 100% stenosed.  2nd Mrg lesion is 100% stenosed.  Mid LAD lesion is 80% stenosed.  Post intervention, there is a 0% residual stenosis.  A drug-eluting stent was successfully placed using a STENT RESOLUTE ONYX 3.0X18.  2nd Diag lesion is 30% stenosed.  Prox RCA to Mid RCA lesion is 50% stenosed.  RPAV lesion is 40% stenosed.  RPDA lesion is 30% stenosed. 1.  Significant two-vessel coronary artery disease with 80% mid LAD stenosis, occluded OM branches and moderate RCA disease. 2.  Left ventricular angiography was not performed.  EF was severely reduced by echo. 3.  Right heart catheterization showed high normal filling pressures with mean RA pressure of 8 mmHg, pulmonary capillary wedge pressure of 12 to 13 mmHg, mild pulmonary hypertension and high cardiac output at 8 L/min. 4.  Successful angioplasty and drug-eluting stent placement to the mid LAD. Recommendations: Continue dual antiplatelet therapy for at least 1 year. The patient needs aggressive medical therapy for the rest of Amber [name] coronary artery disease.  I added high-dose atorvastatin. I added oral metoprolol to control tachycardia. I discontinued IV furosemide for now and Amber [name] might require resumption of this either intravenously or orally as needed. The patient was alert throughout the procedure .  Amber [name] hemodynamics are good enough to allow extubation if no other issues.  Laboratory Data:  High Sensitivity Troponin:   Recent Labs  Lab 02/12/20 0413 02/12/20 0711  TROPONINIHS 10 10     Chemistry Recent Labs  Lab 02/12/20 0413  NA 140  K 3.4*  CL 106  CO2 26  GLUCOSE 128*  BUN 17  CREATININE  0.89  CALCIUM 8.7*  GFRNONAA >60  GFRAA >60  ANIONGAP 8    No results for input(s): PROT, ALBUMIN, AST, ALT, ALKPHOS, BILITOT in the last 168  hours. Hematology Recent Labs  Lab 02/12/20 0413  WBC 17.6*  RBC 4.64  HGB 12.9  HCT 36.8  MCV 79.3*  MCH 27.8  MCHC 35.1  RDW 14.5  PLT 316   BNP Recent Labs  Lab 02/12/20 0413  BNP 31.0    DDimer No results for input(s): DDIMER in the last 168 hours.   Radiology/Studies:  DG Chest Portable 1 View  Result Date: 02/12/2020 CLINICAL DATA:  Chest pain EXAM: PORTABLE CHEST 1 VIEW COMPARISON:  05/23/2019 FINDINGS: Stable generous heart size. Negative aortic contours. Minor scarring atelectasis at the right base. There is no edema, consolidation, effusion, or pneumothorax. IMPRESSION: No active disease. Electronically Signed   By: Marnee Spring M.D.   On: 02/12/2020 04:26       TIMI Risk Score for Unstable Angina or Non-ST Elevation MI:   The patient's TIMI risk score is 4, which indicates a 20% risk of all cause mortality, new or recurrent myocardial infarction or need for urgent revascularization in the next 14 days.   Assessment and Plan:   Unstable Angina History of CAD s/p PCI to mLAD --Current CP and described as identical to that before Amber [name] non-ST elevation myocardial infarction 05/2019 as above.  07/2019 EF 40 to 45%.  Amber [name] has continued to experience chest pain, despite receiving morphine and sublingual nitroglycerin.  --HS Tn 10, 10. EKG without acute ST/T changes. Presentation consistent with UA. Also considered was PE with Ddimer not significantly elevated as above.  --Given active chest pain with history of CAD, recommendation would be for cardiac catheterization over that of Lexiscan for further ischemic work-up if ongoing CP or concerning findings on updated echo, such as reduced EF, WMA, acute structural changes. --Agree with IV heparin. Daily CBC.  --NPO until seen by MD and case discussed with interventional team. --Continue DAPT, BB, statin, and nitro. Amber [name] is slightly volume up on exam with recommendation for IV diuresis / optimization of of breathing status  so Amber [name] is able to lay flat for catheterization as below. Further recommendations if needed pending echo.  Acute on chronic HFrEF Mixed ischemic and nonischemic cardiomyopathy --Reports improving but still present shortness of breath and orthopnea.   --Slightly volume up on exam with right lower lobe crackles and abdominal distention. --Interestingly, Amber [name] BNP is not elevated as above.  --Recommend gentle IV diuresis with potassium repletion.  --Repleting K given hypokalemia.   --Amber [name] will need to be able to lay flat for cardiac catheterization (Amber [name] cannot during exam).   --Continue to monitor I's/O's, daily standing weights.  Daily BMET. --Most recent echo 07/2019 EF 40 to 45%.  Updated echo pending. --Continue medical management.  Of note, Amber [name] was unable to afford Entresto and was switched to losartan 50 mg daily.  New systolic murmur --Update echo as above, given murmur is not noted on previous cardiac exam in the office.  Sinus tachycardia --Continue to monitor on telemetry. Consider as 2/2 pain and volume status.   Hyperthyroidism --Most recent TSH wnl and earlier this week. --Per endocrinology.  HTN --Continue current antihypertensives.   HLD --Continue current statin and Zetia.     For questions or updates, please contact [facility] Please consult [email or web address] for contact info under     Signed, Lennon Alstrom, PA-C  02/12/2020 12:36 PM

## 2020-02-12 NOTE — Progress Notes (Signed)
*  PRELIMINARY RESULTS* Echocardiogram 2D Echocardiogram has been performed.  Cristela Blue 02/12/2020, 2:07 PM

## 2020-02-12 NOTE — ED Provider Notes (Signed)
[facility] Emergency Department Provider Note  ____________________________________________  Time seen: Approximately 4:34 AM  I have reviewed the triage vital signs and the nursing notes.   HISTORY  Chief Complaint Chest Pain   HPI Amber [name] is a 49 y.o. female with a history of CAD status post NSTEMI with DES on Brilinta, anemia, CHF with a EF  of 40 to 45%, hypertension, Graves' disease, prediabetic, hyperlipidemia who presents for evaluation of chest pain.  Patient reports that her pain started 2 days ago initially was only the left arm.  This afternoon the pain moved to her chest.  The pain has been constant, pressure-like, located in the left side of her chest and going down her left arm.  No shortness of breath, no pleuritic chest pain, no cough or fever, no dizziness, no diaphoresis, no nausea or vomiting.  Patient reports her pain is 8 out of 10.  She took a nitro at home and another one per EMS with no significant changes on the pain.  Received a full dose aspirin per EMS.  Patient reports the pain is identical to her heart attack last year.  No personal or family history of blood clots, no recent travel immobilization, no leg pain or swelling, no hemoptysis or exogenous hormones.  No known exposures to Covid.  Patient is status post Covid vaccine x2 with the last one a month ago.  Past Medical History:  Diagnosis Date  . Acute encephalopathy   . Anemia   . CAD (coronary artery disease)   . HFrEF (heart failure with reduced ejection fraction) (HCC)   . HTN (hypertension)   . Hyperthyroidism   . 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 Thyroid storm     Patient Active Problem List   Diagnosis Date Noted  . Graves disease 06/04/2019  . Cardiac shock syndrome (HCC)   . Community acquired pneumonia   . Acute pulmonary edema (HCC)   . Hypoxemia   . Hyperthyroidism 05/20/2019  . Elevated troponin 05/20/2019  . Hypokalemia 05/20/2019  . Acute  respiratory failure with hypoxia (HCC) 05/20/2019  . NSTEMI (non-ST elevated myocardial infarction) (HCC) 05/20/2019  . Unexplained weight loss 03/27/2019  . Chronic right shoulder pain 11/28/2018  . Prediabetes 12/05/2016  . Encounter for annual general medical examination with abnormal findings in adult 12/04/2015  . Hyperlipidemia 12/04/2015  . Essential hypertension 08/20/2015    Past Surgical History:  Procedure Laterality Date  . CARDIAC CATHETERIZATION    . CESAREAN SECTION     x2  . CORONARY STENT INTERVENTION N/A 05/22/2019   Procedure: CORONARY STENT INTERVENTION;  Surgeon: Iran Ouch, MD;  Location: [facility] INVASIVE CV LAB;  Service: Cardiovascular;  Laterality: N/A;  . MASS EXCISION Right 01/18/2013   Procedure: EXCISION right scapular cyst;  Surgeon: Axel Filler, MD;  Location: [facility] ORS;  Service: General;  Laterality: Right;  . RIGHT/LEFT HEART CATH AND CORONARY ANGIOGRAPHY N/A 05/22/2019   Procedure: RIGHT/LEFT HEART CATH AND CORONARY ANGIOGRAPHY;  Surgeon: Iran Ouch, MD;  Location: [facility] INVASIVE CV LAB;  Service: Cardiovascular;  Laterality: N/A;    Prior to Admission medications   Medication Sig Start Date End Date Taking? Authorizing Provider  aspirin 81 MG chewable tablet Chew 1 tablet (81 mg total) by mouth daily. 06/06/19   Sondra Barges, PA-C  atorvastatin (LIPITOR) 80 MG tablet Take 1 tablet (80 mg total) by mouth daily at 6 PM. 06/21/19   Dunn, Raymon Mutton, PA-C  calcium-vitamin D 250-100 MG-UNIT  tablet Take 1 tablet by mouth 2 (two) times daily.    [provider]  ezetimibe (ZETIA) 10 MG tablet Take 1 tablet (10 mg total) by mouth daily. Patient not taking: Reported on 12/12/2019 10/22/19 01/20/20  Creig Hines, NP  ferrous sulfate 325 (65 FE) MG tablet Take 325 mg by mouth every morning.     [provider]  furosemide (LASIX) 20 MG tablet Take 1 tablet (20 mg total) by mouth daily. 06/06/19 12/12/19  Sondra Barges, PA-C  losartan (COZAAR)  50 MG tablet Take 1 tablet (50 mg total) by mouth daily. 12/12/19 03/11/20  Iran Ouch, MD  methimazole (TAPAZOLE) 5 MG tablet Take 1 tablet (5 mg total) by mouth daily. 02/06/20   Shamleffer, Konrad Dolores, MD  metoprolol succinate (TOPROL XL) 25 MG 24 hr tablet Take 0.5 tablets (12.5 mg total) by mouth daily. 08/09/19   Dunn, Raymon Mutton, PA-C  nitroGLYCERIN (NITROSTAT) 0.4 MG SL tablet Place 1 tablet (0.4 mg total) under the tongue every 5 (five) minutes as needed for chest pain. 06/06/19 06/05/20  Sondra Barges, PA-C  potassium chloride SA (K-DUR) 20 MEQ tablet Take 1 tablet (20 mEq total) by mouth daily. 06/13/19   Creig Hines, NP  spironolactone (ALDACTONE) 25 MG tablet Take 0.5 tablets (12.5 mg total) by mouth daily. 08/09/19 12/11/28  Sondra Barges, PA-C  ticagrelor (BRILINTA) 90 MG TABS tablet Take 1 tablet (90 mg total) by mouth 2 (two) times daily. 06/06/19   Sondra Barges, PA-C    Allergies Patient has no known allergies.  Family History  Problem Relation Age of Onset  . Diabetes Father   . Diabetes Mother   . Heart disease Mother   . Diabetes Sister   . Hypertension Sister     Social History Social History   Tobacco Use  . Smoking status: Never Smoker  . Smokeless tobacco: Never Used  Substance Use Topics  . Alcohol use: Yes    Alcohol/week: 0.0 standard drinks    Comment: rare  . Drug use: No    Review of Systems  Constitutional: Negative for fever. Eyes: Negative for visual changes. ENT: Negative for sore throat. Neck: No neck pain  Cardiovascular: + chest pain. Respiratory: Negative for shortness of breath. Gastrointestinal: Negative for abdominal pain, vomiting or diarrhea. Genitourinary: Negative for dysuria. Musculoskeletal: Negative for back pain. Skin: Negative for rash. Neurological: Negative for headaches, weakness or numbness. Psych: No SI or HI  ____________________________________________   PHYSICAL EXAM:  VITAL SIGNS: ED Triage  Vitals  Enc Vitals Group     BP 02/12/20 0355 (!) 164/89     Pulse Rate 02/12/20 0355 78     Resp 02/12/20 0355 19     Temp --      Temp src --      SpO2 02/12/20 0355 100 %     Weight 02/12/20 0356 225 lb (102.1 kg)     Height 02/12/20 0356 5\' 4"  (1.626 m)     Head Circumference --      Peak Flow --      Pain Score 02/12/20 0356 8     Pain Loc --      Pain Edu? --      Excl. in GC? --     Constitutional: Alert and oriented. Well appearing and in no apparent distress. HEENT:      Head: Normocephalic and atraumatic.         Eyes: Conjunctivae are normal.  Sclera is non-icteric.       Mouth/Throat: Mucous membranes are moist.       Neck: Supple with no signs of meningismus. Cardiovascular: Regular rate and rhythm. No murmurs, gallops, or rubs. 2+ symmetrical distal pulses are present in all extremities. No JVD. Respiratory: Normal respiratory effort. Lungs are clear to auscultation bilaterally. No wheezes, crackles, or rhonchi.  Gastrointestinal: Soft, non tender Musculoskeletal: Nontender with normal range of motion in all extremities. No edema, cyanosis, or erythema of extremities. Neurologic: Normal speech and language. Face is symmetric. Moving all extremities. No gross focal neurologic deficits are appreciated. Skin: Skin is warm, dry and intact. No rash noted. Psychiatric: Mood and affect are normal. Speech and behavior are normal.  ____________________________________________   LABS (all labs ordered are listed, but only abnormal results are displayed)  Labs Reviewed  CBC WITH DIFFERENTIAL/PLATELET - Abnormal; Notable for the following components:      Result Value   WBC 17.6 (*)    MCV 79.3 (*)    Neutro Abs 11.0 (*)    Lymphs Abs 4.8 (*)    Monocytes Absolute 1.4 (*)    All other components within normal limits  BASIC METABOLIC PANEL - Abnormal; Notable for the following components:   Potassium 3.4 (*)    Glucose, Bld 128 (*)    Calcium 8.7 (*)    All other  components within normal limits  RESPIRATORY PANEL BY RT PCR (FLU A&B, COVID)  BRAIN NATRIURETIC PEPTIDE  FIBRIN DERIVATIVES D-DIMER ([facility] ONLY)  TROPONIN I (HIGH SENSITIVITY)  TROPONIN I (HIGH SENSITIVITY)   ____________________________________________  EKG  ED ECG REPORT I, Rudene Re, the attending physician, personally viewed and interpreted this ECG.  Normal sinus rhythm, rate of 69, first-degree AV block, normal QTC, normal axis, no ST elevations or depressions.  Unchanged from prior. ____________________________________________  RADIOLOGY  I have personally reviewed the images performed during this visit and I agree with the Radiologist's read.   Interpretation by Radiologist:  DG Chest Portable 1 View  Result Date: 02/12/2020 CLINICAL DATA:  Chest pain EXAM: PORTABLE CHEST 1 VIEW COMPARISON:  05/23/2019 FINDINGS: Stable generous heart size. Negative aortic contours. Minor scarring atelectasis at the right base. There is no edema, consolidation, effusion, or pneumothorax. IMPRESSION: No active disease. Electronically Signed   By: Monte Fantasia M.D.   On: 02/12/2020 04:26     ____________________________________________   PROCEDURES  Procedure(s) performed:yes .1-3 Lead EKG Interpretation Performed by: Rudene Re, MD Authorized by: Rudene Re, MD     Interpretation: normal     ECG rate assessment: normal     Rhythm: sinus rhythm     Ectopy: none     Conduction: normal     Critical Care performed: yes  CRITICAL CARE Performed by: Rudene Re  ?  Total critical care time: 40 min  Critical care time was exclusive of separately billable procedures and treating other patients.  Critical care was necessary to treat or prevent imminent or life-threatening deterioration.  Critical care was time spent personally by me on the following activities: development of treatment plan with patient and/or surrogate as well as nursing,  discussions with consultants, evaluation of patient's response to treatment, examination of patient, obtaining history from patient or surrogate, ordering and performing treatments and interventions, ordering and review of laboratory studies, ordering and review of radiographic studies, pulse oximetry and re-evaluation of patient's condition.  ____________________________________________   INITIAL IMPRESSION / ASSESSMENT AND PLAN / ED COURSE  49 y.o. female with  a history of CAD status post NSTEMI with DES on Brilinta, anemia, CHF with a EF  of 40 to 45%, hypertension, Graves' disease, prediabetic, hyperlipidemia who presents for evaluation of chest pain x 24 hours constantly similar to prior MI.  She is well-appearing and in no distress, has normal vital signs, pulses are equal in all 4 extremities, heart regular rate and rhythm, lungs are clear, abdomen is soft with no tenderness, patient is euvolemic with no pitting edema.  Review of old medical records done showing recent MI with DES in August 2020.  She is currently on Brilinta.  EKG showing no acute ischemic changes.  Patient status post 2 nitroglycerin and full dose of aspirin with no changes in her pain.  Differential diagnoses including ACS versus PE versus dissection versus GERD versus esophageal spasms versus pneumonia versus pneumothorax.  Plan for labs including cycling her troponins, chest x-ray.  Will monitor closely on telemetry.  Will give IV morphine for pain.  _________________________ 6:37 AM on 02/12/2020 -----------------------------------------  Pain improved with IV morphine but patient still having mild CP. First troponin is negative. Since pain is still present, will start patient on IV heparin and admit to Hospitalist service. D-dimer and repeat trop pending.       _____________________________________________ Please note:  Patient was evaluated in Emergency Department today for the symptoms described in the  history of present illness. Patient was evaluated in the context of the global COVID-19 pandemic, which necessitated consideration that the patient might be at risk for infection with the SARS-CoV-2 virus that causes COVID-19. Institutional protocols and algorithms that pertain to the evaluation of patients at risk for COVID-19 are in a state of rapid change based on information released by regulatory bodies including the CDC and federal and state organizations. These policies and algorithms were followed during the patient's care in the ED.  Some ED evaluations and interventions may be delayed as a result of limited staffing during the pandemic.   [place] Controlled Substance Database was reviewed by me. ____________________________________________   FINAL CLINICAL IMPRESSION(S) / ED DIAGNOSES   Final diagnoses:  Unstable angina (HCC)      NEW MEDICATIONS STARTED DURING THIS VISIT:  ED Discharge Orders    None       Note:  This document was prepared using Dragon voice recognition software and may include unintentional dictation errors.    Don Perking, [place], MD 02/25/20 [phone]

## 2020-02-13 DIAGNOSIS — I25118 Atherosclerotic heart disease of native coronary artery with other forms of angina pectoris: Secondary | ICD-10-CM | POA: Diagnosis not present

## 2020-02-13 DIAGNOSIS — I209 Angina pectoris, unspecified: Secondary | ICD-10-CM

## 2020-02-13 DIAGNOSIS — I5033 Acute on chronic diastolic (congestive) heart failure: Secondary | ICD-10-CM | POA: Diagnosis not present

## 2020-02-13 DIAGNOSIS — I509 Heart failure, unspecified: Secondary | ICD-10-CM

## 2020-02-13 DIAGNOSIS — E0590 Thyrotoxicosis, unspecified without thyrotoxic crisis or storm: Secondary | ICD-10-CM | POA: Diagnosis not present

## 2020-02-13 DIAGNOSIS — D72829 Elevated white blood cell count, unspecified: Secondary | ICD-10-CM

## 2020-02-13 DIAGNOSIS — I10 Essential (primary) hypertension: Secondary | ICD-10-CM | POA: Diagnosis not present

## 2020-02-13 LAB — HEPARIN LEVEL (UNFRACTIONATED)
Heparin Unfractionated: 0.57 IU/mL (ref 0.30–0.70)
Heparin Unfractionated: 0.5 IU/mL (ref 0.30–0.70)

## 2020-02-13 LAB — CBC
HCT: 36.1 % (ref 36.0–46.0)
Hemoglobin: 12.7 g/dL (ref 12.0–15.0)
MCH: 27.6 pg (ref 26.0–34.0)
MCHC: 35.2 g/dL (ref 30.0–36.0)
MCV: 78.5 fL — ABNORMAL LOW (ref 80.0–100.0)
Platelets: 283 10*3/uL (ref 150–400)
RBC: 4.6 MIL/uL (ref 3.87–5.11)
RDW: 14.5 % (ref 11.5–15.5)
WBC: 18 10*3/uL — ABNORMAL HIGH (ref 4.0–10.5)
nRBC: 0 % (ref 0.0–0.2)

## 2020-02-13 MED ORDER — ISOSORBIDE MONONITRATE ER 30 MG PO TB24: 15.0000 mg | ORAL_TABLET | Freq: Every day | ORAL | 0 refills | Status: DC

## 2020-02-13 NOTE — Progress Notes (Signed)
ANTICOAGULATION CONSULT NOTE -   Pharmacy Consult for Heparin Indication: chest pain/ACS  No Known Allergies  Patient Measurements: Height: 5\' 4"  (162.6 cm) Weight: 97.8 kg (215 lb 9.6 oz) IBW/kg (Calculated) : 54.7 HEPARIN DW (KG): 78.5  Vital Signs: Temp: 98.6 F (37 C) (04/29 0720) Temp Source: Oral (04/29 0405) BP: 129/76 (04/29 0720) Pulse Rate: 83 (04/29 0720)  Labs: Recent Labs    02/12/20 0413 02/12/20 0711 02/12/20 1408 02/12/20 1944 02/13/20 0319 02/13/20 0853  HGB 12.9  --   --   --  12.7  --   HCT 36.8  --   --   --  36.1  --   PLT 316  --   --   --  283  --   APTT 33  --   --   --   --   --   LABPROT 12.3  --   --   --   --   --   INR 1.0  --   --   --   --   --   HEPARINUNFRC  --   --    < > 0.29* 0.57 0.50  CREATININE 0.89  --   --   --   --   --   TROPONINIHS 10 10  --   --   --   --    < > = values in this interval not displayed.    Estimated Creatinine Clearance: 86.8 mL/min (by C-G formula based on SCr of 0.89 mg/dL).   Medical History: Past Medical History:  Diagnosis Date  . Acute encephalopathy   . Anemia   . CAD (coronary artery disease)   . HFrEF (heart failure with reduced ejection fraction) ([place])   . HTN (hypertension)   . Hyperthyroidism   . 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 Thyroid storm     Medications:  Medications Prior to Admission  Medication Sig Dispense Refill Last Dose  . aspirin 81 MG chewable tablet Chew 1 tablet (81 mg total) by mouth daily. 30 tablet 11 02/11/2020 at 1000  . atorvastatin (LIPITOR) 80 MG tablet Take 1 tablet (80 mg total) by mouth daily at 6 PM. 30 tablet 11 02/11/2020 at 1800  . calcium-vitamin D 250-100 MG-UNIT tablet Take 1 tablet by mouth 2 (two) times daily.   02/11/2020 at 1000  . ferrous sulfate 325 (65 FE) MG tablet Take 325 mg by mouth every morning.    02/11/2020 at 1000  . furosemide (LASIX) 20 MG tablet Take 1 tablet (20 mg total) by mouth daily. 90 tablet 3 02/11/2020 at 1000  .  losartan (COZAAR) 50 MG tablet Take 1 tablet (50 mg total) by mouth daily. 90 tablet 3 02/11/2020 at 1000  . methimazole (TAPAZOLE) 5 MG tablet Take 1 tablet (5 mg total) by mouth daily. 30 tablet 6 02/11/2020 at 1000  . metoprolol succinate (TOPROL XL) 25 MG 24 hr tablet Take 0.5 tablets (12.5 mg total) by mouth daily. 45 tablet 3 02/11/2020 at Unknown time  . nitroGLYCERIN (NITROSTAT) 0.4 MG SL tablet Place 1 tablet (0.4 mg total) under the tongue every 5 (five) minutes as needed for chest pain. 100 tablet 3 PRN at PRN  . potassium chloride SA (K-DUR) 20 MEQ tablet Take 1 tablet (20 mEq total) by mouth daily. 90 tablet 3 02/11/2020 at 1000  . spironolactone (ALDACTONE) 25 MG tablet Take 0.5 tablets (12.5 mg total) by mouth daily. 45 tablet 3 02/11/2020 at 1000  . ticagrelor (  BRILINTA) 90 MG TABS tablet Take 1 tablet (90 mg total) by mouth 2 (two) times daily. 60 tablet 11 02/11/2020 at 1000    Assessment: Asked to start Heparin for ACS.  No anticoagulants PTA per med list.  Baseline labs ordered.  4/28 1408 HL 0.33  4/28 1944 HL 0.29 1200 units IV x 1 bolus and increase drip rate to 1250 units/hr. 4/29 0319 HL 0.57  4/29 0853 HL 0.5   Goal of Therapy:  Heparin level 0.3-0.7 units/ml Monitor platelets by anticoagulation protocol: Yes   Plan:  Heparin level is therapeutic x 2.  CBC stable.  Will continue current rate and recheck HL and CBC with AM labs.  Ronnald Ramp, PharmD, BCPS 02/13/2020,9:46 AM

## 2020-02-13 NOTE — Progress Notes (Signed)
Patient ID: Amber [name]   [facility] [place] [name] was admitted to the Hospital on 02/12/2020 and Discharged  02/13/2020 and should be excused from work/school   for 4 days starting 02/12/2020 , may return to work/school without any restrictions.  Alford Highland M.D on 02/13/2020,at 1:32 PM  [facility] [place] [facility]

## 2020-02-13 NOTE — Discharge Instructions (Signed)
Angina  Angina is very bad discomfort or pain in the chest, neck, arm, jaw, or back. The discomfort is caused by a lack of blood in the middle layer of the heart wall (myocardium). What are the causes? This condition is caused by a buildup of fat and cholesterol (plaque) in your arteries (atherosclerosis). This buildup narrows the arteries and makes it hard for blood to flow. What increases the risk? You are more likely to develop this condition if:  You have high levels of cholesterol in your blood.  You have high blood pressure (hypertension).  You have diabetes.  You have a family history of heart disease.  You are not active, or you do not exercise enough.  You feel sad (depressed).  You have been treated with high energy rays (radiation) on the left side of your chest. Other risk factors are:  Using tobacco.  Being very overweight (obese).  Eating a diet high in unhealthy fats (saturated fats).  Having stress, or being exposed to things that cause stress.  Using drugs, such as cocaine. Women have a greater risk for angina if:  They are older than 55.  They have stopped having their period (are in postmenopause). What are the signs or symptoms? Common symptoms of this condition in both men and women may include:  Chest pain, which may: ? Feel like a crushing or squeezing in the chest. ? Feel like a tightness, pressure, fullness, or heaviness in the chest. ? Last for more than a few minutes at a time. ? Stop and come back (recur) after a few minutes.  Pain in the neck, arm, jaw, or back.  Heartburn or upset stomach (indigestion) for no reason.  Being short of breath.  Feeling sick to your stomach (nauseous).  Sudden cold sweats. Women and people with diabetes may have other symptoms that are not usual, such as feeling:  Tired (fatigue).  Worried or nervous (anxious) for no reason.  Weak for no reason.  Dizzy or passing out (fainting). How is this  treated? This condition may be treated with:  Medicines. These are given to: ? Prevent blood clots. ? Prevent heart attack. ? Relax blood vessels and improve blood flow to the heart (nitrates). ? Reduce blood pressure. ? Improve the pumping action of the heart. ? Reduce fat and cholesterol in the blood.  A procedure to widen a narrowed or blocked artery in the heart (angioplasty).  Surgery to allow blood to go around a blocked artery (coronary artery bypass surgery). Follow these instructions at home: Medicines  Take over-the-counter and prescription medicines only as told by your doctor.  Do not take these medicines unless your doctor says that you can: ? NSAIDs. These include:  Ibuprofen.  Naproxen. ? Vitamin supplements that have vitamin A, vitamin E, or both. ? Hormone therapy that contains estrogen with or without progestin. Eating and drinking   Eat a heart-healthy diet that includes: ? Lots of fresh fruits and vegetables. ? Whole grains. ? Low-fat (lean) protein. ? Low-fat dairy products.  Follow instructions from your doctor about what you cannot eat or drink. Activity  Follow an exercise program that your doctor tells you.  Talk with your doctor about joining a program to help improve the health of your heart (cardiac rehab).  When you feel tired, take a break. Plan breaks if you know you are going to feel tired. Lifestyle   Do not use any products that contain nicotine or tobacco. This includes cigarettes, e-cigarettes, and   chewing tobacco. If you need help quitting, ask your doctor.  If your doctor says you can drink alcohol: ? Limit how much you use to:  0-1 drink a day for women who are not pregnant.  0-2 drinks a day for men. ? Be aware of how much alcohol is in your drink. In the [place] one drink equals:  One 12 oz bottle of beer (355 mL).  One 5 oz glass of wine (148 mL).  One 1 oz glass of hard liquor (44 mL). General instructions  Stay  at a healthy weight. If your doctor tells you to do so, work with him or her to lose weight.  Learn to deal with stress. If you need help, ask your doctor.  Keep your vaccines up to date. Get a flu shot every year.  Talk with your doctor if you feel sad. Take a screening test to see if you are at risk for depression.  Work with your doctor to manage any other health problems that you have. These may include diabetes or high blood pressure.  Keep all follow-up visits as told by your doctor. This is important. Get help right away if:  You have pain in your chest, neck, arm, jaw, or back, and the pain: ? Lasts more than a few minutes. ? Comes back. ? Does not get better after you take medicine under your tongue (sublingual nitroglycerin). ? Keeps getting worse. ? Comes more often.  You have any of these problems for no reason: ? Sweating a lot. ? Heartburn or upset stomach. ? Shortness of breath. ? Trouble breathing. ? Feeling sick to your stomach. ? Throwing up (vomiting). ? Feeling more tired than normal. ? Feeling nervous or worrying more than normal. ? Weakness.  You are suddenly dizzy or light-headed.  You pass out. These symptoms may be an emergency. Do not wait to see if the symptoms will go away. Get medical help right away. Call your local emergency services (911 in the [place] Do not drive yourself to the hospital. Summary  Angina is very bad discomfort or pain in the chest, neck, arm, neck, or back.  Symptoms include chest pain, heartburn or upset stomach for no reason, and shortness of breath.  Women or people with diabetes may have symptoms that are not usual, such as feeling nervous or worried for no reason, weak for no reason, or tired.  Take all medicines only as told by your doctor.  You should eat a heart-healthy diet and follow an exercise program. This information is not intended to replace advice given to you by your health care provider. Make sure you  discuss any questions you have with your health care provider. Document Revised: 05/21/2018 Document Reviewed: 05/21/2018 Elsevier Patient Education  2020 [employer]  

## 2020-02-13 NOTE — Progress Notes (Signed)
Progress Note  Patient Name: Amber [name] Date of Encounter: 02/13/2020  Primary Cardiologist: [facility]-  [name], MD   Subjective   No further chest pain She contacted the nurses this morning, reported symptoms had resolved and felt she could go home Still on heparin infusion Cardiac enzymes negative  Inpatient Medications    Scheduled Meds: . aspirin  81 mg Oral Daily  . atorvastatin  80 mg Oral q1800  . calcium-vitamin D  0.5 tablet Oral BID  . ferrous sulfate  325 mg Oral q morning - 10a  . isosorbide mononitrate  15 mg Oral Daily  . losartan  50 mg Oral Daily  . methimazole  5 mg Oral Daily  . metoprolol succinate  12.5 mg Oral Daily  . potassium chloride SA  20 mEq Oral Daily  . sodium chloride flush  10 mL Intravenous Q12H  . spironolactone  12.5 mg Oral Daily  . ticagrelor  90 mg Oral BID   Continuous Infusions:  PRN Meds: acetaminophen, morphine injection, nitroGLYCERIN, ondansetron (ZOFRAN) IV   Vital Signs    Vitals:   02/12/20 2009 02/13/20 0405 02/13/20 0720 02/13/20 1136  BP: 122/62 (!) 112/58 129/76 126/76  Pulse: 92 86 83 80  Resp: 20 20 19 18   Temp: 99.2 F (37.3 C) 98.9 F (37.2 C) 98.6 F (37 C) 98.4 F (36.9 C)  TempSrc: Oral Oral Oral Oral  SpO2: 94% 95% 95% 96%  Weight:  97.8 kg    Height:        Intake/Output Summary (Last 24 hours) at 02/13/2020 1649 Last data filed at 02/13/2020 1427 Gross per 24 hour  Intake 929.41 ml  Output 1450 ml  Net -520.59 ml   Last 3 Weights 02/13/2020 02/12/2020 12/12/2019  Weight (lbs) 215 lb 9.6 oz 225 lb 215 lb 12 oz  Weight (kg) 97.796 kg 102.059 kg 97.864 kg      Telemetry    Normal sinus rhythm- Personally Reviewed  ECG     - Personally Reviewed  Physical Exam   GEN: No acute distress.   Neck: No JVD Cardiac: RRR, no murmurs, rubs, or gallops.  Respiratory: Clear to auscultation bilaterally. GI: Soft, nontender, non-distended  MS: No edema; No deformity. Neuro:  Nonfocal   Psych: Normal affect   Labs    High Sensitivity Troponin:   Recent Labs  Lab 02/12/20 0413 02/12/20 0711  TROPONINIHS 10 10      Chemistry Recent Labs  Lab 02/12/20 0413  NA 140  K 3.4*  CL 106  CO2 26  GLUCOSE 128*  BUN 17  CREATININE 0.89  CALCIUM 8.7*  GFRNONAA >60  GFRAA >60  ANIONGAP 8     Hematology Recent Labs  Lab 02/12/20 0413 02/13/20 0319  WBC 17.6* 18.0*  RBC 4.64 4.60  HGB 12.9 12.7  HCT 36.8 36.1  MCV 79.3* 78.5*  MCH 27.8 27.6  MCHC 35.1 35.2  RDW 14.5 14.5  PLT 316 283    BNP Recent Labs  Lab 02/12/20 0413  BNP 31.0     DDimer No results for input(s): DDIMER in the last 168 hours.   Radiology    DG Chest Portable 1 View  Result Date: 02/12/2020 CLINICAL DATA:  Chest pain EXAM: PORTABLE CHEST 1 VIEW COMPARISON:  05/23/2019 FINDINGS: Stable generous heart size. Negative aortic contours. Minor scarring atelectasis at the right base. There is no edema, consolidation, effusion, or pneumothorax. IMPRESSION: No active disease. Electronically Signed   By: Neva Seat.D.  On: 02/12/2020 04:26   ECHOCARDIOGRAM COMPLETE  Result Date: 02/12/2020    ECHOCARDIOGRAM REPORT   Patient Name:   Amber [name] Date of Exam: 02/12/2020 Medical Rec #:  [id number]     Height:       64.0 in Accession #:    [id number]    Weight:       225.0 lb Date of Birth:  Jun 22, 1970      BSA:          2.057 m Patient Age:    49 years      BP:           133/75 mmHg Patient Gender: F             HR:           105 bpm. Exam Location:  [facility] Procedure: 2D Echo, Cardiac Doppler and Color Doppler Indications:     Chest pain 786.50  History:         Patient has prior history of Echocardiogram examinations, most                  recent 08/08/2019. CAD; Risk Factors:Hypertension. Heart                  failure with reduced ejection fraction.  Sonographer:     Cristela Blue RDCS (AE) Referring Phys:  [id number] Lennon Alstrom Diagnosing Phys: Debbe Odea MD  Sonographer Comments:  Suboptimal apical window. IMPRESSIONS  1. Left ventricular ejection fraction, by estimation, is 50 to 55%. The left ventricle has low normal function. The left ventricle has no regional wall motion abnormalities. Indeterminate diastolic filling due to E-A fusion.  2. Right ventricular systolic function is normal. The right ventricular size is normal.  3. Left atrial size was mildly dilated.  4. The mitral valve is normal in structure. No evidence of mitral valve regurgitation.  5. The aortic valve is grossly normal. Aortic valve regurgitation is not visualized. FINDINGS  Left Ventricle: Left ventricular ejection fraction, by estimation, is 50 to 55%. The left ventricle has low normal function. The left ventricle has no regional wall motion abnormalities. The left ventricular internal cavity size was normal in size. There is no left ventricular hypertrophy. Indeterminate diastolic filling due to E-A fusion. Right Ventricle: The right ventricular size is normal. Right vetricular wall thickness was not assessed. Right ventricular systolic function is normal. Left Atrium: Left atrial size was mildly dilated. Right Atrium: Right atrial size was normal in size. Pericardium: There is no evidence of pericardial effusion. Mitral Valve: The mitral valve is normal in structure. No evidence of mitral valve regurgitation. Tricuspid Valve: The tricuspid valve is grossly normal. Tricuspid valve regurgitation is not demonstrated. Aortic Valve: The aortic valve is grossly normal. Aortic valve regurgitation is not visualized. Aortic valve mean gradient measures 3.5 mmHg. Aortic valve peak gradient measures 7.4 mmHg. Aortic valve area, by VTI measures 2.45 cm. Pulmonic Valve: The pulmonic valve was not well visualized. Pulmonic valve regurgitation is not visualized. Aorta: The aortic root is normal in size and structure. Venous: The inferior vena cava was not well visualized. IAS/Shunts: No atrial level shunt detected by color flow  Doppler.  LEFT VENTRICLE PLAX 2D LVIDd:         4.75 cm  Diastology LVIDs:         3.43 cm  LV e' lateral:   14.40 cm/s LV PW:         1.09 cm  LV E/e' lateral:  5.4 LV IVS:        0.80 cm  LV e' medial:    12.80 cm/s LVOT diam:     2.00 cm  LV E/e' medial:  6.1 LV SV:         42 LV SV Index:   20 LVOT Area:     3.14 cm  RIGHT VENTRICLE RV Basal diam:  3.53 cm RV S prime:     11.00 cm/s TAPSE (M-mode): 3.9 cm LEFT ATRIUM           Index       RIGHT ATRIUM           Index LA diam:      3.60 cm 1.75 cm/m  RA Area:     19.40 cm LA Vol (A4C): 73.6 ml 35.78 ml/m RA Volume:   56.30 ml  27.37 ml/m  AORTIC VALVE                   PULMONIC VALVE AV Area (Vmax):    1.76 cm    PV Vmax:        0.70 m/s AV Area (Vmean):   2.15 cm    PV Peak grad:   1.9 mmHg AV Area (VTI):     2.45 cm    RVOT Peak grad: 6 mmHg AV Vmax:           136.00 cm/s AV Vmean:          87.850 cm/s AV VTI:            0.172 m AV Peak Grad:      7.4 mmHg AV Mean Grad:      3.5 mmHg LVOT Vmax:         76.40 cm/s LVOT Vmean:        60.200 cm/s LVOT VTI:          0.134 m LVOT/AV VTI ratio: 0.78  AORTA Ao Root diam: 2.90 cm MITRAL VALVE                TRICUSPID VALVE MV Area (PHT): 11.85 cm    TR Peak grad:   8.6 mmHg MV Decel Time: 64 msec      TR Vmax:        147.00 cm/s MV E velocity: 78.00 cm/s MV A velocity: 122.00 cm/s  SHUNTS MV E/A ratio:  0.64         Systemic VTI:  0.13 m                             Systemic Diam: 2.00 cm Debbe Odea MD Electronically signed by Debbe Odea MD Signature Date/Time: 02/12/2020/3:33:18 PM    Final     Cardiac Studies   Echocardiogram 1. Left ventricular ejection fraction, by visual estimation, is 40 to  45%. The left ventricle has severely decreased function. Mildly increased  left ventricular size. There is no left ventricular hypertrophy.  2. Left ventricular diastolic Doppler parameters are consistent with  pseudonormalization pattern of LV diastolic filling.  3. Global right ventricle  has normal systolic function.The right  ventricular size is normal. No increase in right ventricular wall  thickness.  4. Left atrial size was mildly dilated.  5. Mildly elevated pulmonary artery systolic pressure.    Patient Profile     49 year old female with history of hypertension, CAD status post PCI 05/2019, heart failure reduced ejection fraction last EF 40 to 45%  who presents due to history of chest discomfort for 24 hours  Assessment & Plan    Chest pain/angina Chest pain for 24 hours on arrival, hypertensive in the emergency room EKG nonacute, cardiac enzymes negative --Echocardiogram ejection fraction 50 to 55%, no focal wall motion abnormalities (EF improved from October 2020) No significant pain overnight, no pain through the course of the day, ambulating without pain -Patient had indicated to nursing that she felt better and thought she could go home --At the time of my rounds patient had eaten lunch --- Long discussion with her concerning options for ischemic work-up.  Patient is interested in going home as she feels well She has walked around the unit without any recurrent symptoms.  Given the above, and discussion with the patient, we will arrange a Myoview next week to rule out high risk ischemia  Coronary artery disease with stable angina On aspirin with Brilinta We will call to confirm, make sure she has Brilinta coupon Continue Imdur, metoprolol, spironolactone  Hyperlipidemia Continue Lipitor 80 daily  Hypothyroidism Has Graves' disease Improvement in TSH T4 over the past 5 to 7 months On methimazole  Long discussion with patient, discussed various types of imaging studies that could be performed for ischemic work-up As per the patient to call patient's nephew Nephew was called personally by myself, details discussed, plan discussed  Total encounter time more than 35 minutes  Greater than 50% was spent in counseling and coordination of care with the  patient   For questions or updates, please contact [facility] Please consult [email or web address] for contact info under        Signed, Julien Nordmann, MD  02/13/2020, 4:49 PM

## 2020-02-13 NOTE — Progress Notes (Signed)
ANTICOAGULATION CONSULT NOTE -   Pharmacy Consult for Heparin Indication: chest pain/ACS  No Known Allergies  Patient Measurements: Height: 5\' 4"  (162.6 cm) Weight: 97.8 kg (215 lb 9.6 oz) IBW/kg (Calculated) : 54.7 HEPARIN DW (KG): 78.5  Vital Signs: Temp: 98.9 F (37.2 C) (04/29 0405) Temp Source: Oral (04/29 0405) BP: 112/58 (04/29 0405) Pulse Rate: 86 (04/29 0405)  Labs: Recent Labs    02/12/20 0413 02/12/20 0711 02/12/20 1408 02/12/20 1944 02/13/20 0319  HGB 12.9  --   --   --  12.7  HCT 36.8  --   --   --  36.1  PLT 316  --   --   --  283  APTT 33  --   --   --   --   LABPROT 12.3  --   --   --   --   INR 1.0  --   --   --   --   HEPARINUNFRC  --   --  0.33 0.29* 0.57  CREATININE 0.89  --   --   --   --   TROPONINIHS 10 10  --   --   --     Estimated Creatinine Clearance: 86.8 mL/min (by C-G formula based on SCr of 0.89 mg/dL).   Medical History: Past Medical History:  Diagnosis Date  . Acute encephalopathy   . Anemia   . CAD (coronary artery disease)   . HFrEF (heart failure with reduced ejection fraction) ([place])   . HTN (hypertension)   . Hyperthyroidism   . 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 Thyroid storm     Medications:  Medications Prior to Admission  Medication Sig Dispense Refill Last Dose  . aspirin 81 MG chewable tablet Chew 1 tablet (81 mg total) by mouth daily. 30 tablet 11 02/11/2020 at 1000  . atorvastatin (LIPITOR) 80 MG tablet Take 1 tablet (80 mg total) by mouth daily at 6 PM. 30 tablet 11 02/11/2020 at 1800  . calcium-vitamin D 250-100 MG-UNIT tablet Take 1 tablet by mouth 2 (two) times daily.   02/11/2020 at 1000  . ferrous sulfate 325 (65 FE) MG tablet Take 325 mg by mouth every morning.    02/11/2020 at 1000  . furosemide (LASIX) 20 MG tablet Take 1 tablet (20 mg total) by mouth daily. 90 tablet 3 02/11/2020 at 1000  . losartan (COZAAR) 50 MG tablet Take 1 tablet (50 mg total) by mouth daily. 90 tablet 3 02/11/2020 at 1000  .  methimazole (TAPAZOLE) 5 MG tablet Take 1 tablet (5 mg total) by mouth daily. 30 tablet 6 02/11/2020 at 1000  . metoprolol succinate (TOPROL XL) 25 MG 24 hr tablet Take 0.5 tablets (12.5 mg total) by mouth daily. 45 tablet 3 02/11/2020 at Unknown time  . nitroGLYCERIN (NITROSTAT) 0.4 MG SL tablet Place 1 tablet (0.4 mg total) under the tongue every 5 (five) minutes as needed for chest pain. 100 tablet 3 PRN at PRN  . potassium chloride SA (K-DUR) 20 MEQ tablet Take 1 tablet (20 mEq total) by mouth daily. 90 tablet 3 02/11/2020 at 1000  . spironolactone (ALDACTONE) 25 MG tablet Take 0.5 tablets (12.5 mg total) by mouth daily. 45 tablet 3 02/11/2020 at 1000  . ticagrelor (BRILINTA) 90 MG TABS tablet Take 1 tablet (90 mg total) by mouth 2 (two) times daily. 60 tablet 11 02/11/2020 at 1000    Assessment: Asked to start Heparin for ACS.  No anticoagulants PTA per med list.  Baseline labs ordered.  4/28 1408 HL 0.33   Goal of Therapy:  Heparin level 0.3-0.7 units/ml Monitor platelets by anticoagulation protocol: Yes   Plan:  4/28:  HL @ 1944 = 0.29 Will order heparin 1200 units IV x 1 bolus and increase drip rate to 1250 units/hr. Will recheck HL 6 hrs after rate change.   4/29 0319 HL 0.57, therapeutic x 1.  CBC stable.  Will continue current rate and recheck HL in 6 hours to confirm.  Wayland Denis, PharmD 02/13/2020,4:11 AM

## 2020-02-13 NOTE — Discharge Summary (Signed)
[facility] [place] at [facility]   PATIENT NAME: Amber [name]    MR#:  [id number]  DATE OF BIRTH:  January 23, 1971  DATE OF ADMISSION:  02/12/2020 ADMITTING PHYSICIAN: Lucile Shutters, MD  DATE OF DISCHARGE: 02/13/2020  PRIMARY CARE PHYSICIAN: Doreene Nest, NP    ADMISSION DIAGNOSIS:  Unstable angina (HCC) [I20.0]  DISCHARGE DIAGNOSIS:  Principal Problem:   Unstable angina (HCC) Active Problems:   Essential hypertension   Hyperthyroidism   CAD (coronary artery disease), native coronary artery   SECONDARY DIAGNOSIS:   Past Medical History:  Diagnosis Date  . Acute encephalopathy   . Anemia   . CAD (coronary artery disease)   . HFrEF (heart failure with reduced ejection fraction) (HCC)   . HTN (hypertension)   . Hyperthyroidism   . 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 Thyroid storm     HOSPITAL COURSE:   1.  Angina.  Patient's chest pain has gone away last night and she wanted to go home.  Cardiac enzymes are negative.  Case discussed with cardiology and they will set up for an outpatient stress test on Monday.  Imdur was added to the patient's regimen.  She is already on aspirin, atorvastatin, Brilinta, metoprolol and losartan. 2.  Essential hypertension stable on losartan and metoprolol 3.  Hypothyroidism on methimazole 4.  Acute on chronic midrange congestive heart failure with EF 40 to 45%.  Most recent echocardiogram shows a normal ejection fraction.  Patient was given diuresis while here can continue Lasix as outpatient 5.  Leukocytosis unclear etiology.  Chest x-ray negative.  Patient does not have any fever.  Recommend repeating as outpatient  DISCHARGE CONDITIONS:   Satisfactory  CONSULTS OBTAINED:  Treatment Team:  Debbe Odea, MD  DRUG ALLERGIES:  No Known Allergies  DISCHARGE MEDICATIONS:   Allergies as of 02/13/2020   No Known Allergies     Medication List    TAKE these medications   aspirin 81 MG chewable  tablet Chew 1 tablet (81 mg total) by mouth daily.   atorvastatin 80 MG tablet Commonly known as: LIPITOR Take 1 tablet (80 mg total) by mouth daily at 6 PM.   calcium-vitamin D 250-100 MG-UNIT tablet Take 1 tablet by mouth 2 (two) times daily.   ferrous sulfate 325 (65 FE) MG tablet Take 325 mg by mouth every morning.   furosemide 20 MG tablet Commonly known as: LASIX Take 1 tablet (20 mg total) by mouth daily.   isosorbide mononitrate 30 MG 24 hr tablet Commonly known as: IMDUR Take 0.5 tablets (15 mg total) by mouth daily. Start taking on: February 14, 2020   losartan 50 MG tablet Commonly known as: COZAAR Take 1 tablet (50 mg total) by mouth daily.   methimazole 5 MG tablet Commonly known as: TAPAZOLE Take 1 tablet (5 mg total) by mouth daily.   metoprolol succinate 25 MG 24 hr tablet Commonly known as: Toprol XL Take 0.5 tablets (12.5 mg total) by mouth daily.   nitroGLYCERIN 0.4 MG SL tablet Commonly known as: Nitrostat Place 1 tablet (0.4 mg total) under the tongue every 5 (five) minutes as needed for chest pain.   potassium chloride SA 20 MEQ tablet Commonly known as: KLOR-CON Take 1 tablet (20 mEq total) by mouth daily.   spironolactone 25 MG tablet Commonly known as: ALDACTONE Take 0.5 tablets (12.5 mg total) by mouth daily.   ticagrelor 90 MG Tabs tablet Commonly known as: BRILINTA Take 1 tablet (90 mg total) by mouth  2 (two) times daily.        DISCHARGE INSTRUCTIONS:   Follow-up PMD 5 days Follow-up cardiology on Monday for stress test  If you experience worsening of your admission symptoms, develop shortness of breath, life threatening emergency, suicidal or homicidal thoughts you must seek medical attention immediately by calling 911 or calling your MD immediately  if symptoms less severe.  You Must read complete instructions/literature along with all the possible adverse reactions/side effects for all the Medicines you take and that have been  prescribed to you. Take any new Medicines after you have completely understood and accept all the possible adverse reactions/side effects.   Please note  You were cared for by a hospitalist during your hospital stay. If you have any questions about your discharge medications or the care you received while you were in the hospital after you are discharged, you can call the unit and asked to speak with the hospitalist on call if the hospitalist that took care of you is not available. Once you are discharged, your primary care physician will handle any further medical issues. Please note that NO REFILLS for any discharge medications will be authorized once you are discharged, as it is imperative that you return to your primary care physician (or establish a relationship with a primary care physician if you do not have one) for your aftercare needs so that they can reassess your need for medications and monitor your lab values.    Today   CHIEF COMPLAINT:   Chief Complaint  Patient presents with  . Chest Pain    HISTORY OF PRESENT ILLNESS:  Amber [name]  is a 49 y.o. female came in with chest pain.  Had 2 sets of cardiac enzymes that were negative.   VITAL SIGNS:  Blood pressure 126/76, pulse 80, temperature 98.4 F (36.9 C), temperature source Oral, resp. rate 18, height  (1.626 m), weight 97.8 kg, last menstrual period 06/19/2017, SpO2 96 %.  I/O:    Intake/Output Summary (Last 24 hours) at 02/13/2020 1521 Last data filed at 02/13/2020 1427 Gross per 24 hour  Intake 929.41 ml  Output 1450 ml  Net -520.59 ml    PHYSICAL EXAMINATION:  GENERAL:  49 y.o.-year-old patient lying flat in the bed with no acute distress.  EYES: Pupils equal, round, reactive to light and accommodation. No scleral icterus. Extraocular muscles intact.  HEENT: Head atraumatic, normocephalic. Oropharynx and nasopharynx clear.  LUNGS: Normal breath sounds bilaterally, no wheezing, rales,rhonchi or  crepitation. No use of accessory muscles of respiration.  CARDIOVASCULAR: S1, S2 normal. No murmurs, rubs, or gallops.  ABDOMEN: Soft, non-tender, non-distended. Bowel sounds present. No organomegaly or mass.  EXTREMITIES: No pedal edema, cyanosis, or clubbing.  NEUROLOGIC: Cranial nerves II through XII are intact. Muscle strength 5/5 in all extremities. Sensation intact. Gait not checked.  PSYCHIATRIC: The patient is alert and oriented x 3.  SKIN: No obvious rash, lesion, or ulcer.   DATA REVIEW:   CBC Recent Labs  Lab 02/13/20 0319  WBC 18.0*  HGB 12.7  HCT 36.1  PLT 283    Chemistries  Recent Labs  Lab 02/12/20 0413  NA 140  K 3.4*  CL 106  CO2 26  GLUCOSE 128*  BUN 17  CREATININE 0.89  CALCIUM 8.7*    Microbiology Results  Results for orders placed or performed during the hospital encounter of 02/12/20  Respiratory Panel by RT PCR (Flu A&B, Covid) - Nasopharyngeal Swab     Status: None  Collection Time: 02/12/20  7:11 AM   Specimen: Nasopharyngeal Swab  Result Value Ref Range Status   SARS Coronavirus 2 by RT PCR NEGATIVE NEGATIVE Final    Comment: (NOTE) SARS-CoV-2 target nucleic acids are NOT DETECTED. The SARS-CoV-2 RNA is generally detectable in upper respiratoy specimens during the acute phase of infection. The lowest concentration of SARS-CoV-2 viral copies this assay can detect is 131 copies/mL. A negative result does not preclude SARS-Cov-2 infection and should not be used as the sole basis for treatment or other patient management decisions. A negative result may occur with  improper specimen collection/handling, submission of specimen other than nasopharyngeal swab, presence of viral mutation(s) within the areas targeted by this assay, and inadequate number of viral copies (<131 copies/mL). A negative result must be combined with clinical observations, patient history, and epidemiological information. The expected result is Negative. Fact Sheet  for Patients:  https://www.moore.com/ Fact Sheet for Healthcare Providers:  https://www.young.biz/ This test is not yet ap proved or cleared by the [place] FDA and  has been authorized for detection and/or diagnosis of SARS-CoV-2 by FDA under an Emergency Use Authorization (EUA). This EUA will remain  in effect (meaning this test can be used) for the duration of the COVID-19 declaration under Section 564(b)(1) of the Act, 21 U.S.C. section 360bbb-3(b)(1), unless the authorization is terminated or revoked sooner.    Influenza A by PCR NEGATIVE NEGATIVE Final   Influenza B by PCR NEGATIVE NEGATIVE Final    Comment: (NOTE) The Xpert Xpress SARS-CoV-2/FLU/RSV assay is intended as an aid in  the diagnosis of influenza from Nasopharyngeal swab specimens and  should not be used as a sole basis for treatment. Nasal washings and  aspirates are unacceptable for Xpert Xpress SARS-CoV-2/FLU/RSV  testing. Fact Sheet for Patients: https://www.moore.com/ Fact Sheet for Healthcare Providers: https://www.young.biz/ This test is not yet approved or cleared by the [place] FDA and  has been authorized for detection and/or diagnosis of SARS-CoV-2 by  FDA under an Emergency Use Authorization (EUA). This EUA will remain  in effect (meaning this test can be used) for the duration of the  Covid-19 declaration under Section 564(b)(1) of the Act, 21  U.S.C. section 360bbb-3(b)(1), unless the authorization is  terminated or revoked. Performed at [facility], [address] [place], [place] [address]   CULTURE, BLOOD (ROUTINE X 2) w Reflex to ID Panel     Status: None (Preliminary result)   Collection Time: 02/12/20  6:29 PM   Specimen: BLOOD  Result Value Ref Range Status   Specimen Description BLOOD BLOOD RIGHT HAND  Final   Special Requests   Final    BOTTLES DRAWN AEROBIC AND ANAEROBIC Blood Culture  adequate volume   Culture   Final    NO GROWTH < 12 HOURS Performed at [facility], [address] [place], [place] [address]    Report Status PENDING  Incomplete  CULTURE, BLOOD (ROUTINE X 2) w Reflex to ID Panel     Status: None (Preliminary result)   Collection Time: 02/12/20  6:36 PM   Specimen: BLOOD  Result Value Ref Range Status   Specimen Description BLOOD BLOOD RIGHT FOREARM  Final   Special Requests   Final    BOTTLES DRAWN AEROBIC AND ANAEROBIC Blood Culture adequate volume   Culture   Final    NO GROWTH < 12 HOURS Performed at [facility], [address] [place], [place] [address]    Report Status PENDING  Incomplete    RADIOLOGY:  DG Chest Portable 1 View  Result Date: 02/12/2020 CLINICAL DATA:  Chest pain EXAM: PORTABLE CHEST 1 VIEW COMPARISON:  05/23/2019 FINDINGS: Stable generous heart size. Negative aortic contours. Minor scarring atelectasis at the right base. There is no edema, consolidation, effusion, or pneumothorax. IMPRESSION: No active disease. Electronically Signed   By: Marnee Spring M.D.   On: 02/12/2020 04:26   ECHOCARDIOGRAM COMPLETE  Result Date: 02/12/2020    ECHOCARDIOGRAM REPORT   Patient Name:   Amber [name] Date of Exam: 02/12/2020 Medical Rec #:  [id number]     Height:       64.0 in Accession #:    [id number]    Weight:       225.0 lb Date of Birth:  Feb 10, 1971      BSA:          2.057 m Patient Age:    48 years      BP:           133/75 mmHg Patient Gender: F             HR:           105 bpm. Exam Location:  [facility] Procedure: 2D Echo, Cardiac Doppler and Color Doppler Indications:     Chest pain 786.50  History:         Patient has prior history of Echocardiogram examinations, most                  recent 08/08/2019. CAD; Risk Factors:Hypertension. Heart                  failure with reduced ejection fraction.  Sonographer:     Cristela Blue RDCS (AE) Referring Phys:  [id number] Lennon Alstrom Diagnosing Phys: Debbe Odea MD   Sonographer Comments: Suboptimal apical window. IMPRESSIONS  1. Left ventricular ejection fraction, by estimation, is 50 to 55%. The left ventricle has low normal function. The left ventricle has no regional wall motion abnormalities. Indeterminate diastolic filling due to E-A fusion.  2. Right ventricular systolic function is normal. The right ventricular size is normal.  3. Left atrial size was mildly dilated.  4. The mitral valve is normal in structure. No evidence of mitral valve regurgitation.  5. The aortic valve is grossly normal. Aortic valve regurgitation is not visualized. FINDINGS  Left Ventricle: Left ventricular ejection fraction, by estimation, is 50 to 55%. The left ventricle has low normal function. The left ventricle has no regional wall motion abnormalities. The left ventricular internal cavity size was normal in size. There is no left ventricular hypertrophy. Indeterminate diastolic filling due to E-A fusion. Right Ventricle: The right ventricular size is normal. Right vetricular wall thickness was not assessed. Right ventricular systolic function is normal. Left Atrium: Left atrial size was mildly dilated. Right Atrium: Right atrial size was normal in size. Pericardium: There is no evidence of pericardial effusion. Mitral Valve: The mitral valve is normal in structure. No evidence of mitral valve regurgitation. Tricuspid Valve: The tricuspid valve is grossly normal. Tricuspid valve regurgitation is not demonstrated. Aortic Valve: The aortic valve is grossly normal. Aortic valve regurgitation is not visualized. Aortic valve mean gradient measures 3.5 mmHg. Aortic valve peak gradient measures 7.4 mmHg. Aortic valve area, by VTI measures 2.45 cm. Pulmonic Valve: The pulmonic valve was not well visualized. Pulmonic valve regurgitation is not visualized. Aorta: The aortic root is normal in size and structure. Venous: The inferior vena cava was not  well visualized. IAS/Shunts: No atrial level shunt  detected by color flow Doppler.  LEFT VENTRICLE PLAX 2D LVIDd:         4.75 cm  Diastology LVIDs:         3.43 cm  LV e' lateral:   14.40 cm/s LV PW:         1.09 cm  LV E/e' lateral: 5.4 LV IVS:        0.80 cm  LV e' medial:    12.80 cm/s LVOT diam:     2.00 cm  LV E/e' medial:  6.1 LV SV:         42 LV SV Index:   20 LVOT Area:     3.14 cm  RIGHT VENTRICLE RV Basal diam:  3.53 cm RV S prime:     11.00 cm/s TAPSE (M-mode): 3.9 cm LEFT ATRIUM           Index       RIGHT ATRIUM           Index LA diam:      3.60 cm 1.75 cm/m  RA Area:     19.40 cm LA Vol (A4C): 73.6 ml 35.78 ml/m RA Volume:   56.30 ml  27.37 ml/m  AORTIC VALVE                   PULMONIC VALVE AV Area (Vmax):    1.76 cm    PV Vmax:        0.70 m/s AV Area (Vmean):   2.15 cm    PV Peak grad:   1.9 mmHg AV Area (VTI):     2.45 cm    RVOT Peak grad: 6 mmHg AV Vmax:           136.00 cm/s AV Vmean:          87.850 cm/s AV VTI:            0.172 m AV Peak Grad:      7.4 mmHg AV Mean Grad:      3.5 mmHg LVOT Vmax:         76.40 cm/s LVOT Vmean:        60.200 cm/s LVOT VTI:          0.134 m LVOT/AV VTI ratio: 0.78  AORTA Ao Root diam: 2.90 cm MITRAL VALVE                TRICUSPID VALVE MV Area (PHT): 11.85 cm    TR Peak grad:   8.6 mmHg MV Decel Time: 64 msec      TR Vmax:        147.00 cm/s MV E velocity: 78.00 cm/s MV A velocity: 122.00 cm/s  SHUNTS MV E/A ratio:  0.64         Systemic VTI:  0.13 m                             Systemic Diam: 2.00 cm Kate Sable MD Electronically signed by Kate Sable MD Signature Date/Time: 02/12/2020/3:33:18 PM    Final      Management plans discussed with the patient, and she is in agreement.  CODE STATUS:     Code Status Orders  (From admission, onward)         Start     Ordered   02/12/20 0758  Full code  Continuous     02/12/20 0759  Code Status History    Date Active Date Inactive Code Status Order ID Comments User Context   05/22/2019 1113 05/25/2019 1822 Full Code [id number]   Iran Ouch, MD Inpatient   Advance Care Planning Activity      TOTAL TIME TAKING CARE OF THIS PATIENT: 32 minutes.    Alford Highland M.D on 02/13/2020 at 3:21 PM  Between 7am to 6pm - Pager - [phone]  After 6pm go to [email or web address] - password [email or web address]  [facility]  CC: Primary care physician; Doreene Nest, NP

## 2020-02-17 ENCOUNTER — Inpatient Hospital Stay: Payer: Commercial Managed Care - PPO | Admitting: Primary Care

## 2020-02-17 ENCOUNTER — Telehealth: Payer: Self-pay | Admitting: *Deleted

## 2020-02-17 DIAGNOSIS — I2510 Atherosclerotic heart disease of native coronary artery without angina pectoris: Secondary | ICD-10-CM

## 2020-02-17 LAB — CULTURE, BLOOD (ROUTINE X 2)
Culture: NO GROWTH
Culture: NO GROWTH
Special Requests: ADEQUATE
Special Requests: ADEQUATE

## 2020-02-17 NOTE — Telephone Encounter (Signed)
Left voicemail message for patient to call back.

## 2020-02-17 NOTE — Telephone Encounter (Signed)
-----   Message from Antonieta Iba, MD sent at 02/13/2020  1:28 PM EDT ----- D/c from the hospital on Thursday 4/29 Can we call Monday,  Set up a lexiscan myoview later in week Known CAD, prior stent, need stress test, she had angina Thx TG

## 2020-02-19 ENCOUNTER — Other Ambulatory Visit: Payer: Self-pay

## 2020-02-19 ENCOUNTER — Ambulatory Visit
Admission: RE | Admit: 2020-02-19 | Discharge: 2020-02-19 | Disposition: A | Discharge status: Home / Self Care | Payer: Commercial Managed Care - PPO | Source: Ambulatory Visit | Attending: Cardiovascular Disease | Admitting: Cardiovascular Disease

## 2020-02-19 DIAGNOSIS — I2510 Atherosclerotic heart disease of native coronary artery without angina pectoris: Secondary | ICD-10-CM | POA: Insufficient documentation

## 2020-02-19 LAB — NM MYOCAR MULTI W/SPECT W/WALL MOTION / EF
Estimated workload: 1 METS
Exercise duration (min): 0 min
Exercise duration (sec): 0 s
LV dias vol: 201 mL (ref 46–106)
LV sys vol: 84 mL
MPHR: 171 {beats}/min
Peak HR: 141 {beats}/min
Percent HR: 82 %
Rest HR: 55 {beats}/min
SDS: 1
SRS: 7
SSS: 9
TID: 1.09

## 2020-02-19 MED ORDER — TECHNETIUM TC 99M TETROFOSMIN IV KIT
10.0000 | PACK | Freq: Once | INTRAVENOUS | Status: AC | PRN
  Administered 2020-02-19: 10.608 via INTRAVENOUS

## 2020-02-19 MED ORDER — TECHNETIUM TC 99M TETROFOSMIN IV KIT
32.3200 | PACK | Freq: Once | INTRAVENOUS | Status: AC | PRN
  Administered 2020-02-19: 32.32 via INTRAVENOUS

## 2020-02-19 MED ORDER — REGADENOSON 0.4 MG/5ML IV SOLN
0.4000 mg | Freq: Once | INTRAVENOUS | Status: AC
  Administered 2020-02-19: 0.4 mg via INTRAVENOUS
  Filled 2020-02-19: qty 5

## 2020-02-19 NOTE — Telephone Encounter (Signed)
Left voicemail message that we are calling to arrange testing and she has appointment tomorrow with request for her to call back or we can discuss tomorrow.

## 2020-02-19 NOTE — Telephone Encounter (Signed)
Got it, thank you!  Alver Sorrow, NP

## 2020-02-20 ENCOUNTER — Encounter: Payer: Self-pay | Admitting: Family

## 2020-02-20 ENCOUNTER — Ambulatory Visit (INDEPENDENT_AMBULATORY_CARE_PROVIDER_SITE_OTHER): Payer: Commercial Managed Care - PPO | Admitting: Family

## 2020-02-20 VITALS — BP 130/80 | HR 65 | Ht 64.0 in | Wt 219.1 lb

## 2020-02-20 DIAGNOSIS — I2510 Atherosclerotic heart disease of native coronary artery without angina pectoris: Secondary | ICD-10-CM

## 2020-02-20 DIAGNOSIS — E785 Hyperlipidemia, unspecified: Secondary | ICD-10-CM | POA: Diagnosis not present

## 2020-02-20 DIAGNOSIS — I5022 Chronic systolic (congestive) heart failure: Secondary | ICD-10-CM | POA: Diagnosis not present

## 2020-02-20 MED ORDER — ISOSORBIDE MONONITRATE ER 30 MG PO TB24: 15.0000 mg | ORAL_TABLET | Freq: Every day | ORAL | 0 refills | Status: DC

## 2020-02-20 NOTE — Progress Notes (Signed)
Office Visit    Patient Name: Amber [name] Date of Encounter: 02/20/2020  Primary Care Provider:  [name], NP Primary Cardiologist:  [name], MD Electrophysiologist:  None   Chief Complaint    Marche Hottenstein is a 49 y.o. female with a hx of CAD, chronic systolic heart failure, HTN, hypothyroidism with previous thyroid storm, anemia, obesity presents today for follow up after Lexiscan and echocardiogram.   Past Medical History    Past Medical History:  Diagnosis Date  . Acute encephalopathy   . Anemia   . CAD (coronary artery disease)   . HFrEF (heart failure with reduced ejection fraction) ([place])   . HTN (hypertension)   . Hyperthyroidism   . 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 Thyroid storm    Past Surgical History:  Procedure Laterality Date  . CARDIAC CATHETERIZATION    . CESAREAN SECTION     x2  . CORONARY STENT INTERVENTION N/A 05/22/2019   Procedure: CORONARY STENT INTERVENTION;  Surgeon: Wellington Hampshire, MD;  Location: [place] [facility];  Service: Cardiovascular;  Laterality: N/A;  . MASS EXCISION Right 01/18/2013   Procedure: EXCISION right scapular cyst;  Surgeon: Ralene Ok, MD;  Location: [facility];  Service: General;  Laterality: Right;  . RIGHT/LEFT HEART CATH AND CORONARY ANGIOGRAPHY N/A 05/22/2019   Procedure: RIGHT/LEFT HEART CATH AND CORONARY ANGIOGRAPHY;  Surgeon: Wellington Hampshire, MD;  Location: [place] [facility];  Service: Cardiovascular;  Laterality: N/A;    Allergies  No Known Allergies  History of Present Illness    Amber [name] is a 48 y.o. female with a hx of CAD, chronic systolic heart failure, HTN, hypothyroidism with previous thyroid storm, anemia, obesity. She was last seen while hospitalized.  She was diagnosed with severe hyperthyroidism August 2020. Hospitalized at the time with acute respiratory failure requiring mechanical ventilation. She had NSTEMI and pulmonary edema. Echo with LVEF 25-30%, wall motion  abnormalities suggestive of Takotsubo cardiomyopathy. R/LHC showing two-vessel CAD with 80% stenosis in mid LAD, occluded OM branches, moderate RCA disease. PCI and DES to mid LAD. Repeat echocardiogram October EF 40-45%.  At clinic visit 12/12/19 she was feeling well. Her Delene Loll was transitioned to Losartan due to cost.   Admitted 02/12/20-02/13/20 to [facility] for chest pain. Cardiac enyzmes negative, Imdur added, and outpatient stress test arranged.  Lexiscan Myoview 02/19/2020 with no ST segment deviation, severe perfusion defects of mild to moderate size in the mid to apical anteroseptal wall, basal to mid lateral wall, entry apex consistent with prior myocardial infarction.  Low to intermediate risk study with no evidence for ischemia.  Since hospital discharge she reports no recurrent chest pain, pressure, or tightness.  Reports no shortness of breath no dyspnea on exertion.  We reviewed her echocardiogram and Lexiscan Myoview results in detail and she was very reassured.  She is tolerating Imdur well with no recurrent anginal symptoms and no headache.  Reports no orthopnea, PND, edema.  EKGs/Labs/Other Studies Reviewed:   The following studies were reviewed today:  Lexiscan Myoview 02/19/20 There was no ST segment deviation noted during stress. There are severe perfusion defects of mild to moderate size present in the mid to apical anteroseptal wall, basal to mid lateral wall, and the true apex. Findings consistent with prior myocardial infarction. The left ventricular ejection fraction is normal (54%). This is a low to intermediate risk study. There was no evidence for ischemia Echo 02/12/20  1. Left ventricular ejection fraction, by estimation, is 50 to  55%. The  left ventricle has low normal function. The left ventricle has no regional  wall motion abnormalities. Indeterminate diastolic filling due to E-A  fusion.   2. Right ventricular systolic function is normal. The right ventricular    size is normal.   3. Left atrial size was mildly dilated.   4. The mitral valve is normal in structure. No evidence of mitral valve  regurgitation.   5. The aortic valve is grossly normal. Aortic valve regurgitation is not  visualized.   LHC 05/22/2019  1st Mrg lesion is 100% stenosed.  2nd Mrg lesion is 100% stenosed.  Mid LAD lesion is 80% stenosed.  Post intervention, there is a 0% residual stenosis.  A drug-eluting stent was successfully placed using a STENT RESOLUTE ONYX 3.0X18.  2nd Diag lesion is 30% stenosed.  Prox RCA to Mid RCA lesion is 50% stenosed.  RPAV lesion is 40% stenosed.  RPDA lesion is 30% stenosed. 1.  Significant two-vessel coronary artery disease with 80% mid LAD stenosis, occluded OM branches and moderate RCA disease. 2.  Left ventricular angiography was not performed.  EF was severely reduced by echo. 3.  Right heart catheterization showed high normal filling pressures with mean RA pressure of 8 mmHg, pulmonary capillary wedge pressure of 12 to 13 mmHg, mild pulmonary hypertension and high cardiac output at 8 L/min. 4.  Successful angioplasty and drug-eluting stent placement to the mid LAD. Recommendations: Continue dual antiplatelet therapy for at least 1 year. The patient needs aggressive medical therapy for the rest of her coronary artery disease.  I added high-dose atorvastatin. I added oral metoprolol to control tachycardia. I discontinued IV furosemide for now and she might require resumption of this either intravenously or orally as needed. The patient was alert throughout the procedure .  Her hemodynamics are good enough to allow extubation if no other issues.  EKG:  EKG is ordered today.  The ekg ordered today demonstrates NSR 65 bpm with minimal voltage criteria for LVH and no significant ST/T wave changes.  Recent Labs: 05/24/2019: Magnesium 2.0 12/09/2019: ALT 16 02/05/2020: TSH 3.04 02/12/2020: B Natriuretic Peptide 31.0; BUN 17;  Creatinine, Ser 0.89; Potassium 3.4; Sodium 140 02/13/2020: Hemoglobin 12.7; Platelets 283  Recent Lipid Panel    Component Value Date/Time   CHOL 140 12/09/2019 1420   TRIG 71 12/09/2019 1420   HDL 52 12/09/2019 1420   CHOLHDL 2.7 12/09/2019 1420   VLDL 14 12/09/2019 1420   LDLCALC 74 12/09/2019 1420    Home Medications   Current Meds  Medication Sig  . aspirin 81 MG chewable tablet Chew 1 tablet (81 mg total) by mouth daily.  Marland Kitchen atorvastatin (LIPITOR) 80 MG tablet Take 1 tablet (80 mg total) by mouth daily at 6 PM.  . calcium-vitamin D 250-100 MG-UNIT tablet Take 1 tablet by mouth 2 (two) times daily.  Marland Kitchen ezetimibe (ZETIA) 10 MG tablet Take 10 mg by mouth daily.  . ferrous sulfate 325 (65 FE) MG tablet Take 325 mg by mouth every morning.   . furosemide (LASIX) 20 MG tablet Take 1 tablet (20 mg total) by mouth daily.  . isosorbide mononitrate (IMDUR) 30 MG 24 hr tablet Take 0.5 tablets (15 mg total) by mouth daily.  Marland Kitchen losartan (COZAAR) 50 MG tablet Take 1 tablet (50 mg total) by mouth daily.  . methimazole (TAPAZOLE) 5 MG tablet Take 1 tablet (5 mg total) by mouth daily.  . metoprolol succinate (TOPROL XL) 25 MG 24 hr tablet Take 0.5  tablets (12.5 mg total) by mouth daily.  . nitroGLYCERIN (NITROSTAT) 0.4 MG SL tablet Place 1 tablet (0.4 mg total) under the tongue every 5 (five) minutes as needed for chest pain.  . potassium chloride SA (K-DUR) 20 MEQ tablet Take 1 tablet (20 mEq total) by mouth daily.  Marland Kitchen spironolactone (ALDACTONE) 25 MG tablet Take 0.5 tablets (12.5 mg total) by mouth daily.  . ticagrelor (BRILINTA) 90 MG TABS tablet Take 1 tablet (90 mg total) by mouth 2 (two) times daily.  . [DISCONTINUED] isosorbide mononitrate (IMDUR) 30 MG 24 hr tablet Take 0.5 tablets (15 mg total) by mouth daily.      Review of Systems      Review of Systems  Constitution: Negative for chills, fever and malaise/fatigue.  Cardiovascular: Negative for chest pain, dyspnea on exertion,  irregular heartbeat, leg swelling, near-syncope, orthopnea, palpitations and syncope.  Respiratory: Negative for cough, shortness of breath and wheezing.   Gastrointestinal: Negative for melena, nausea and vomiting.  Genitourinary: Negative for hematuria.  Neurological: Negative for dizziness, light-headedness and weakness.   All other systems reviewed and are otherwise negative except as noted above.  Physical Exam    VS:  BP 130/80 (BP Location: Left Arm, Patient Position: Sitting, Cuff Size: Large)   Pulse 65   Ht '5\' 4"'  (1.626 m)   Wt 219 lb 2 oz (99.4 kg)   LMP 06/19/2017 (Approximate) Comment: over 2 years ago  SpO2 98%   BMI 37.61 kg/m  , BMI Body mass index is 37.61 kg/m. GEN: Well nourished, overweight, well developed, in no acute distress. HEENT: normal. Neck: Supple, no JVD, carotid bruits, or masses. Cardiac: RRR, no murmurs, rubs, or gallops. No clubbing, cyanosis, edema.  Radials/PT 2+ and equal bilaterally.  Respiratory:  Respirations regular and unlabored, clear to auscultation bilaterally. GI: Soft, nontender, nondistended, BS + x 4. MS: No deformity or atrophy. Skin: Warm and dry, no rash. Scattered ecchymosis to bilateral upper extremities from recent admission with lab sticks/IVs.  Neuro:  Strength and sensation are intact. Psych: Normal affect.  Assessment & Plan    1. CAD -recent hospital admission with unstable angina.  Troponins were negative, EKG without changes.  Echo 02/04/2020 LVEF 50 to 55%, no RWMA.  Outpatient Lexiscan Myoview yesterday with no ST segment deviation, severe perfusion defects of mild to moderate size and mid to apical anteroseptal wall, basal to mid lateral wall and true apex consistent with prior MI, low to intermediate risk study with no evidence of ischemia.  She has had no recurrent anginal symptoms since discharge.  EKG today with no acute changes.  She tolerates the addition of Imdur 15 mg daily well.  No indication for further  ischemic evaluation at this time.  Continue present GDMT including aspirin, beta-blocker, statin, Zetia, Imdur.  If she has recurrent anginal symptoms consider up titration of Imdur versus diagnostic left heart catheterization.  2. Chronic systolic heart failure - Mixed ischemic and nonischemic cardiomyopathy.  Euvolemic and well compensated on exam.  Recent echo with improvement in LVEF to 50-55%.  NYHA I.  Present GDMT includes aspirin, beta-blocker, loop diuretic, MRA, ARB.  No Entresto as it was cost prohibitive.  Continue low-sodium, healthy diet.  3. Thyroid disease - Follows with endocrinology.  Recent lab work with normal TSH, T4.  4. HLD, LDL goal <74 - 12/09/19 LDL 74.  She has since been started on Zetia.  She politely declines lab work today as she feels she was poked in parotid a  lot during her recent hospitalization.  She is agreeable to get a lipid panel in [facility]-Met drawn prior to seeing Dr. Fletcher Anon in August and the orders were placed today.  Disposition: Follow up in August   with Dr. Fletcher Anon as previously scheduled.  She will have fasting lipid panel in [facility] drawn 1 day prior in the medical mall.  Loel Dubonnet, NP 02/20/2020, 10:13 AM

## 2020-02-20 NOTE — Patient Instructions (Addendum)
Medication Instructions:  No medication changes today.   *If you need a refill on your cardiac medications before your next appointment, please call your pharmacy*  Lab Work: No lab work today.   Your physician recommends that you return for lab work 1-5 days prior to your appointment with Dr. Kirke Corin. Go to the Medical Mall for fasting lipid panel and CMET.   If you have labs (blood work) drawn today and your tests are completely normal, you will receive your results only by: Marland Kitchen MyChart Message (if you have MyChart) OR . A paper copy in the mail If you have any lab test that is abnormal or we need to change your treatment, we will call you to review the results.   Testing/Procedures: Your EKG shows normal sinus rhythm and is stable compared to previous. This is a good result!  Your echocardiogram shows your heart pumping function is back to normal.   Your stress test showed old heart attack, but no new blockages.   Follow-Up: At [facility], you and your health needs are our priority.  As part of our continuing mission to provide you with exceptional heart care, we have created designated Provider Care Teams.  These Care Teams include your primary Cardiologist (physician) and Advanced Practice Providers (APPs -  Physician Assistants and Nurse Practitioners) who all work together to provide you with the care you need, when you need it.  We recommend signing up for the patient portal called "MyChart".  Sign up information is provided on this After Visit Summary.  MyChart is used to connect with patients for Virtual Visits (Telemedicine).  Patients are able to view lab/test results, encounter notes, upcoming appointments, etc.  Non-urgent messages can be sent to your provider as well.   To learn more about what you can do with MyChart, go to [email or web address].    Your next appointment:   In August with Dr. Kirke Corin as scheduled.

## 2020-02-21 NOTE — Addendum Note (Signed)
Addended by: Kendrick Fries on: 02/21/2020 04:34 PM   Modules accepted: Orders

## 2020-02-26 ENCOUNTER — Ambulatory Visit
Admission: RE | Admit: 2020-02-26 | Discharge: 2020-02-26 | Disposition: A | Discharge status: Home / Self Care | Payer: Commercial Managed Care - PPO | Source: Ambulatory Visit | Attending: Primary Care | Admitting: Primary Care

## 2020-02-26 ENCOUNTER — Other Ambulatory Visit: Payer: Self-pay

## 2020-02-26 DIAGNOSIS — Z1231 Encounter for screening mammogram for malignant neoplasm of breast: Secondary | ICD-10-CM

## 2020-04-06 ENCOUNTER — Other Ambulatory Visit: Payer: Self-pay

## 2020-04-06 ENCOUNTER — Encounter: Payer: Self-pay | Admitting: Internal Medicine

## 2020-04-06 ENCOUNTER — Ambulatory Visit: Payer: Commercial Managed Care - PPO | Admitting: Internal Medicine

## 2020-04-06 VITALS — BP 136/84 | HR 75 | Ht 64.0 in | Wt 224.4 lb

## 2020-04-06 DIAGNOSIS — E0590 Thyrotoxicosis, unspecified without thyrotoxic crisis or storm: Secondary | ICD-10-CM | POA: Diagnosis not present

## 2020-04-06 LAB — T4, FREE: Free T4: 0.69 ng/dL (ref 0.60–1.60)

## 2020-04-06 LAB — TSH: TSH: 1.62 u[IU]/mL (ref 0.35–4.50)

## 2020-04-06 NOTE — Patient Instructions (Signed)
-   Continue Methimazole 5 mg daily - until you hear otherwise from us.  

## 2020-04-06 NOTE — Progress Notes (Signed)
Name: Amber [name]  MRN/ DOB: [id number], 10-Aug-1971    Age/ Sex: 49 y.o., female     PCP: Amber Koch, NP   Reason for Endocrinology Evaluation: Hyperthytroidism     Initial Endocrinology Clinic Visit: 04/03/2019    PATIENT IDENTIFIER: Amber [name] is a 49 y.o., female with a past medical history of HTN and CAD (S/P PCI 05/2019).She has followed with [facility] since 04/03/2019 for consultative assistance with management of her Hyperthyrodism  HISTORICAL SUMMARY: The patient was first diagnosed with hyperthyroidism in 03/2019, with a suppressed TSH at < 0.01 uIU/mL with elevated FT4 5.96 ng/dL. She did have weight loss  For ~ 4 months prior to her presentation.  Methimazole started in 03/2019  SUBJECTIVE:     Today (04/06/2020):  Amber [name] is here for a follow up on hyperthyroidism.   Patient has noted weight gain which is concerning to her Denies constipation Denies chest pain   No burring or itching in the eyes No local neck symptoms  Last eye exam 09/05/19  Methimazole 5 mg daily    ROS:  As per HPI.   HISTORY:  Past Medical History:  Past Medical History:  Diagnosis Date  . Acute encephalopathy   . Anemia   . CAD (coronary artery disease)   . HFrEF (heart failure with reduced ejection fraction) (Amber [name])   . HTN (hypertension)   . Hyperthyroidism   . 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 Thyroid storm    Past Surgical History:  Past Surgical History:  Procedure Laterality Date  . CARDIAC CATHETERIZATION    . CESAREAN SECTION     x2  . CORONARY STENT INTERVENTION N/A 05/22/2019   Procedure: CORONARY STENT INTERVENTION;  Surgeon: Wellington Hampshire, MD;  Location: [place] [facility];  Service: Cardiovascular;  Laterality: N/A;  . MASS EXCISION Right 01/18/2013   Procedure: EXCISION right scapular cyst;  Surgeon: Ralene Ok, MD;  Location: [facility];  Service: General;  Laterality: Right;  . RIGHT/LEFT HEART CATH AND CORONARY  ANGIOGRAPHY N/A 05/22/2019   Procedure: RIGHT/LEFT HEART CATH AND CORONARY ANGIOGRAPHY;  Surgeon: Wellington Hampshire, MD;  Location: [place] [facility];  Service: Cardiovascular;  Laterality: N/A;    Social History:  reports that she has never smoked. She has never used smokeless tobacco. She reports current alcohol use. She reports that she does not use drugs. Family History:  Family History  Problem Relation Age of Onset  . Diabetes Father   . Diabetes Mother   . Heart disease Mother   . Diabetes Sister   . Hypertension Sister      HOME MEDICATIONS: Allergies as of 04/06/2020   No Known Allergies     Medication List       Accurate as of April 06, 2020 10:01 AM. If you have any questions, ask your nurse or doctor.        STOP taking these medications   spironolactone 25 MG tablet Commonly known as: ALDACTONE Stopped by: Dorita Sciara, MD     TAKE these medications   aspirin 81 MG chewable tablet Chew 1 tablet (81 mg total) by mouth daily.   atorvastatin 80 MG tablet Commonly known as: LIPITOR Take 1 tablet (80 mg total) by mouth daily at 6 PM.   calcium-vitamin D 250-100 MG-UNIT tablet Take 1 tablet by mouth 2 (two) times daily.   ezetimibe 10 MG tablet Commonly known as: ZETIA Take 10 mg by mouth daily.   ferrous  sulfate 325 (65 FE) MG tablet Take 325 mg by mouth every morning.   furosemide 20 MG tablet Commonly known as: LASIX Take 1 tablet (20 mg total) by mouth daily.   isosorbide mononitrate 30 MG 24 hr tablet Commonly known as: IMDUR Take 0.5 tablets (15 mg total) by mouth daily.   losartan 50 MG tablet Commonly known as: COZAAR Take 1 tablet (50 mg total) by mouth daily.   methimazole 5 MG tablet Commonly known as: TAPAZOLE Take 1 tablet (5 mg total) by mouth daily.   metoprolol succinate 25 MG 24 hr tablet Commonly known as: Toprol XL Take 0.5 tablets (12.5 mg total) by mouth daily.   nitroGLYCERIN 0.4 MG SL tablet Commonly known as:  Nitrostat Place 1 tablet (0.4 mg total) under the tongue every 5 (five) minutes as needed for chest pain.   potassium chloride SA 20 MEQ tablet Commonly known as: KLOR-CON Take 1 tablet (20 mEq total) by mouth daily.   ticagrelor 90 MG Tabs tablet Commonly known as: BRILINTA Take 1 tablet (90 mg total) by mouth 2 (two) times daily.         OBJECTIVE:   PHYSICAL EXAM: VS: BP 136/84 (BP Location: Left Arm, Patient Position: Sitting, Cuff Size: Normal)   Pulse 75   Ht 5\' 4"  (1.626 m)   Wt 224 lb 6.4 oz (101.8 kg)   LMP 06/19/2017 (Approximate) Comment: over 2 years ago  SpO2 97%   BMI 38.52 kg/m    EXAM: General: Pt appears well and is in NAD  Neck: General: Supple without adenopathy. Thyroid: Thyroid size is prominent.  No nodules appreciated. No thyroid bruit.  Lungs: Clear with good BS bilat with no rales, rhonchi, or wheezes  Heart: Auscultation: RRR.  Extremities:  BL LE: No pretibial edema normal ROM and strength.  Mental Status: Mood and affect: No depression, anxiety, or agitation     DATA REVIEWED:  Results for Amber [name] (MRN [name]) as of 04/06/2020 15:11  Ref. Range 04/06/2020 10:11  TSH Latest Ref Range: 0.35 - 4.50 uIU/mL 1.62  T4,Free(Direct) Latest Ref Range: 0.60 - 1.60 ng/dL 04/08/2020      ASSESSMENT / PLAN / RECOMMENDATIONS:   1. Hyperthyroidism Secondary to Graves' Disease:  - Clinically euthyroid - No local neck symptoms  - She has been compliant with methimazole without side effects  -TFT's are normal   Medications  Continue Methimazole 5 mg  daily   2. Graves' Disease:   - She is up to date on ophthalmological exam    Addendum: Attempted to call the pt went straight to voice mail. A letter will be sent.   Follow-up in 4 months  Labs in 8 weeks  Signed electronically by: 2.63, MD  [facility] Endocrinology  [facility] [address] [place] [address] Amber [name], Amber [name] [place] Phone:  [phone] FAX: [phone]      CC: [phone], NP [address] Amber [name] Phone: [phone]  Fax: [phone]   Return to Endocrinology clinic as below: Future Appointments  Date Time Provider Department Center  06/11/2020 11:20 AM 06/13/2020, MD [id number]

## 2020-04-08 ENCOUNTER — Other Ambulatory Visit: Payer: Self-pay | Admitting: Family

## 2020-04-08 ENCOUNTER — Ambulatory Visit: Payer: Commercial Managed Care - PPO | Admitting: Internal Medicine

## 2020-06-10 ENCOUNTER — Other Ambulatory Visit: Payer: Self-pay | Admitting: Physician Assistant

## 2020-06-11 ENCOUNTER — Encounter: Payer: Self-pay | Admitting: Cardiovascular Disease

## 2020-06-11 ENCOUNTER — Ambulatory Visit (INDEPENDENT_AMBULATORY_CARE_PROVIDER_SITE_OTHER): Payer: Commercial Managed Care - PPO | Admitting: Cardiovascular Disease

## 2020-06-11 ENCOUNTER — Other Ambulatory Visit: Payer: Self-pay

## 2020-06-11 VITALS — BP 130/92 | HR 74 | Ht 64.0 in | Wt 234.8 lb

## 2020-06-11 DIAGNOSIS — I5032 Chronic diastolic (congestive) heart failure: Secondary | ICD-10-CM

## 2020-06-11 DIAGNOSIS — I2510 Atherosclerotic heart disease of native coronary artery without angina pectoris: Secondary | ICD-10-CM

## 2020-06-11 DIAGNOSIS — E785 Hyperlipidemia, unspecified: Secondary | ICD-10-CM

## 2020-06-11 DIAGNOSIS — I10 Essential (primary) hypertension: Secondary | ICD-10-CM

## 2020-06-11 DIAGNOSIS — E079 Disorder of thyroid, unspecified: Secondary | ICD-10-CM

## 2020-06-11 MED ORDER — METOPROLOL SUCCINATE ER 25 MG PO TB24
25.0000 mg | ORAL_TABLET | Freq: Every day | ORAL | 3 refills | Status: DC
Start: 2020-06-11 — End: 2021-06-14

## 2020-06-11 MED ORDER — CLOPIDOGREL BISULFATE 75 MG PO TABS: 75.0000 mg | ORAL_TABLET | Freq: Every day | ORAL | 2 refills | Status: DC

## 2020-06-11 NOTE — Patient Instructions (Signed)
Medication Instructions:  Your physician has recommended you make the following change in your medication:   1) Complete your supply of Brilinta. Then START Plavix (clopodigrel) 75 mg daily. An Rx has been sent to your pharmacy.  2) INCREASE Metoprolol to 25 mg daily. An Rx has been sent to your pharmacy.  *If you need a refill on your cardiac medications before your next appointment, please call your pharmacy*   Lab Work: None ordered If you have labs (blood work) drawn today and your tests are completely normal, you will receive your results only by:  MyChart Message (if you have MyChart) OR  A paper copy in the mail If you have any lab test that is abnormal or we need to change your treatment, we will call you to review the results.   Testing/Procedures: None ordered   Follow-Up: At [facility], you and your health needs are our priority.  As part of our continuing mission to provide you with exceptional heart care, we have created designated Provider Care Teams.  These Care Teams include your primary Cardiologist (physician) and Advanced Practice Providers (APPs -  Physician Assistants and Nurse Practitioners) who all work together to provide you with the care you need, when you need it.  We recommend signing up for the patient portal called "MyChart".  Sign up information is provided on this After Visit Summary.  MyChart is used to connect with patients for Virtual Visits (Telemedicine).  Patients are able to view lab/test results, encounter notes, upcoming appointments, etc.  Non-urgent messages can be sent to your provider as well.   To learn more about what you can do with MyChart, go to [email or web address].    Your next appointment:   6 month(s)  The format for your next appointment:   In Person  Provider:    You may see Lorine Bears, MD or one of the following Advanced Practice Providers on your designated Care Team:    Nicolasa Ducking, NP  Eula Listen,  PA-C  Marisue Ivan, PA-C    Other Instructions N/A

## 2020-06-11 NOTE — Progress Notes (Signed)
Cardiology Office Note   Date:  06/11/2020   ID:  Amber [name], DOB 1971-01-08, MRN [id number]  PCP:  Doreene Nest, NP  Cardiologist:   Lorine Bears, MD   Chief Complaint  Patient presents with  . Follow-up    6 Months follow up. Medications verbally reviewed with patient.       History of Present Illness: Amber [name] is a 49 y.o. female who presents for follow-up visit regarding coronary artery disease and chronic systolic heart failure.  She has other chronic medical conditions including essential hypertension, history of hypothyroidism with previous thyroid storm, anemia and obesity. She was diagnosed with severe hyperthyroidism in August 2020.  She was hospitalized at that time with acute respiratory failure requiring mechanical ventilation.  She had non-STEMI and pulmonary edema.  Echocardiogram showed an EF of 25 to 30% with wall motion abnormalities suggestive of Takotsubo cardiomyopathy versus anterior infarct.  She underwent a right and left cardiac catheterization and was found to have two-vessel coronary artery disease with 80% stenosis in the mid LAD, occluded OM branches and moderate RCA disease.  PCI and drug-eluting stent placement was done to the mid LAD.  Heart failure medications were optimized and hypothyroidism was treated.  Repeat echocardiogram in October showed an EF of 40 to 45%. She was hospitalized in April with atypical chest pain and ruled out for myocardial infarction.  Imdur was added.  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  Echocardiogram showed an EF of 50 to 55% with no significant valvular abnormalities. She has been doing reasonably well with no recurrent chest pain.  She reports mild exertional dyspnea.   Past Medical History:  Diagnosis Date  . Acute encephalopathy   . Anemia   . CAD (coronary artery disease)   . HFrEF (heart failure with reduced ejection fraction)  (HCC)   . HTN (hypertension)   . Hyperthyroidism   . 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 Thyroid storm     Past Surgical History:  Procedure Laterality Date  . CARDIAC CATHETERIZATION    . CESAREAN SECTION     x2  . CORONARY STENT INTERVENTION N/A 05/22/2019   Procedure: CORONARY STENT INTERVENTION;  Surgeon: Iran Ouch, MD;  [facility];  Service: Cardiovascular;  Laterality: N/A;  . MASS EXCISION Right 01/18/2013   Procedure: EXCISION right scapular cyst;  Surgeon: Axel Filler, MD;  [facility]: [facility];  Service: General;  Laterality: Right;  . RIGHT/LEFT HEART CATH AND CORONARY ANGIOGRAPHY N/A 05/22/2019   Procedure: RIGHT/LEFT HEART CATH AND CORONARY ANGIOGRAPHY;  Surgeon: Iran Ouch, MD;  [facility];  Service: Cardiovascular;  Laterality: N/A;     Current Outpatient Medications  Medication Sig Dispense Refill  . aspirin 81 MG chewable tablet Chew 1 tablet (81 mg total) by mouth daily. 30 tablet 11  . atorvastatin (LIPITOR) 80 MG tablet Take 1 tablet (80 mg total) by mouth daily at 6 PM. 30 tablet 11  . calcium-vitamin D 250-100 MG-UNIT tablet Take 1 tablet by mouth 2 (two) times daily.    Marland Kitchen ezetimibe (ZETIA) 10 MG tablet Take 10 mg by mouth daily.    . ferrous sulfate 325 (65 FE) MG tablet Take 325 mg by mouth every morning.     . furosemide (LASIX) 20 MG tablet TAKE 1 TABLET BY MOUTH EVERY DAY 90 tablet 0  . isosorbide mononitrate (IMDUR) 30 MG 24 hr tablet TAKE  1/2 TABLET BY MOUTH DAILY 45 tablet 2  . losartan (COZAAR) 50 MG tablet Take 1 tablet (50 mg total) by mouth daily. 90 tablet 3  . methimazole (TAPAZOLE) 5 MG tablet Take 1 tablet (5 mg total) by mouth daily. 30 tablet 6  . metoprolol succinate (TOPROL XL) 25 MG 24 hr tablet Take 0.5 tablets (12.5 mg total) by mouth daily. 45 tablet 3  . nitroGLYCERIN (NITROSTAT) 0.4 MG SL tablet Place 1 tablet (0.4 mg total) under the tongue every 5 (five) minutes as needed for chest pain.  100 tablet 3  . potassium chloride SA (K-DUR) 20 MEQ tablet Take 1 tablet (20 mEq total) by mouth daily. 90 tablet 3  . spironolactone (ALDACTONE) 25 MG tablet Take 12.5 mg by mouth daily.    . ticagrelor (BRILINTA) 90 MG TABS tablet Take 1 tablet (90 mg total) by mouth 2 (two) times daily. 60 tablet 11   No current facility-administered medications for this visit.    Allergies:   Patient has no known allergies.    Social History:  The patient  reports that she has never smoked. She has never used smokeless tobacco. She reports current alcohol use. She reports that she does not use drugs.   Family History:  The patient's family history includes Diabetes in her father, mother, and sister; Heart disease in her mother; Hypertension in her sister.    ROS:  Please see the history of present illness.   Otherwise, review of systems are positive for none.   All other systems are reviewed and negative.    PHYSICAL EXAM: VS:  BP (!) 130/92 (BP Location: Left Arm, Patient Position: Sitting, Cuff Size: Large)   Pulse 74   Ht 5\' 4"  (1.626 m)   Wt 234 lb 12.8 oz (106.5 kg)   LMP 06/19/2017 (Approximate) Comment: over 2 years ago  SpO2 98%   BMI 40.30 kg/m  , BMI Body mass index is 40.3 kg/m. GEN: Well nourished, well developed, in no acute distress  HEENT: normal  Neck: no JVD, carotid bruits, or masses Cardiac: RRR; no murmurs, rubs, or gallops,no edema  Respiratory:  clear to auscultation bilaterally, normal work of breathing GI: soft, nontender, nondistended, + BS MS: no deformity or atrophy  Skin: warm and dry, no rash Neuro:  Strength and sensation are intact Psych: euthymic mood, full affect   EKG:  EKG is ordered today. The ekg ordered today demonstrates normal sinus rhythm with LVH and nonspecific T wave changes.  Heart rate is 74 bpm.   Recent Labs: 12/09/2019: ALT 16 02/12/2020: B Natriuretic Peptide 31.0; BUN 17; Creatinine, Ser 0.89; Potassium 3.4; Sodium 140 02/13/2020:  Hemoglobin 12.7; Platelets 283 04/06/2020: TSH 1.62    Lipid Panel    Component Value Date/Time   CHOL 140 12/09/2019 1420   TRIG 71 12/09/2019 1420   HDL 52 12/09/2019 1420   CHOLHDL 2.7 12/09/2019 1420   VLDL 14 12/09/2019 1420   LDLCALC 74 12/09/2019 1420      Wt Readings from Last 3 Encounters:  06/11/20 234 lb 12.8 oz (106.5 kg)  04/06/20 224 lb 6.4 oz (101.8 kg)  02/20/20 219 lb 2 oz (99.4 kg)       No flowsheet data found.    ASSESSMENT AND PLAN:  1.  Coronary artery disease involving native coronary arteries: She is doing very well with no anginal symptoms.  I elected to switch Brilinta to clopidogrel 75 mg once daily.  The plan is to use this  for another year.  2.  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  Continue losartan.  I increase Toprol to 25 mg once daily.  Most recent EF was 50 to 55%.  3.  Thyroid disease: Managed by endocrinology.  4 essential hypertension: Blood pressure is well controlled  5.  Hyperlipidemia: Continue high-dose atorvastatin and Zetia.    Disposition:   FU with me in 6 months  Signed,  Lorine Bears, MD  06/11/2020 11:32 AM    [facility]

## 2020-06-17 ENCOUNTER — Other Ambulatory Visit: Payer: Self-pay

## 2020-06-17 MED ORDER — NITROGLYCERIN 0.4 MG SL SUBL: 0.4000 mg | SUBLINGUAL_TABLET | SUBLINGUAL | 1 refills | Status: AC | PRN

## 2020-07-24 ENCOUNTER — Other Ambulatory Visit: Payer: Self-pay | Admitting: Physician Assistant

## 2020-07-29 ENCOUNTER — Ambulatory Visit: Payer: Commercial Managed Care - PPO | Admitting: Internal Medicine

## 2020-07-29 NOTE — Progress Notes (Deleted)
Name: Amber [name]  MRN/ DOB: [id number], 22-Feb-1971    Age/ Sex: 49 y.o., female     PCP: Doreene Nest, NP   Reason for Endocrinology Evaluation: Hyperthytroidism     Initial Endocrinology Clinic Visit: 04/03/2019    PATIENT IDENTIFIER: Amber [name] is a 49 y.o., female with a past medical history of HTN and CAD (S/P PCI 05/2019).She has followed with [facility] since 04/03/2019 for consultative assistance with management of her Hyperthyrodism  HISTORICAL SUMMARY: The patient was first diagnosed with hyperthyroidism in 03/2019, with a suppressed TSH at < 0.01 uIU/mL with elevated FT4 5.96 ng/dL. She did have weight loss  For ~ 4 months prior to her presentation.  Methimazole started in 03/2019  SUBJECTIVE:     Today (07/29/2020):  Amber [name] is here for a follow up on hyperthyroidism.   Patient has noted weight gain which is concerning to her Denies constipation Denies chest pain   No burring or itching in the eyes No local neck symptoms  Last eye exam 09/05/19  Methimazole 5 mg daily    ROS:  As per HPI.   HISTORY:  Past Medical History:  Past Medical History:  Diagnosis [name]  . Acute encephalopathy   . Anemia   . CAD (coronary artery disease)   . HFrEF (heart failure with reduced ejection fraction) (HCC)   . HTN (hypertension)   . Hyperthyroidism   . 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 Thyroid storm    Past Surgical History:  Past Surgical History:  Procedure Laterality [name]  . CARDIAC CATHETERIZATION    . CESAREAN SECTION     x2  . CORONARY STENT INTERVENTION N/A 05/22/2019   Procedure: CORONARY STENT INTERVENTION;  Surgeon: Iran Ouch, MD;  [facility];  Service: Cardiovascular;  Laterality: N/A;  . MASS EXCISION Right 01/18/2013   Procedure: EXCISION right scapular cyst;  Surgeon: Axel Filler, MD;  [facility]: [facility];  Service: General;  Laterality: Right;  . RIGHT/LEFT HEART CATH AND CORONARY  ANGIOGRAPHY N/A 05/22/2019   Procedure: RIGHT/LEFT HEART CATH AND CORONARY ANGIOGRAPHY;  Surgeon: Iran Ouch, MD;  [facility];  Service: Cardiovascular;  Laterality: N/A;    Social History:  reports that she has never smoked. She has never used smokeless tobacco. She reports current alcohol use. She reports that she does not use drugs. Family History:  Family History  Problem Relation Age of Onset  . Diabetes Father   . Diabetes Mother   . Heart disease Mother   . Diabetes Sister   . Hypertension Sister      HOME MEDICATIONS: Allergies as of 07/29/2020   No Known Allergies     Medication List       Accurate as of July 29, 2020  7:12 AM. If you have any questions, ask your nurse or doctor.        aspirin 81 MG chewable tablet Chew 1 tablet (81 mg total) by mouth daily.   atorvastatin 80 MG tablet Commonly known as: LIPITOR Take 1 tablet (80 mg total) by mouth daily at 6 PM.   calcium-vitamin D 250-100 MG-UNIT tablet Take 1 tablet by mouth 2 (two) times daily.   clopidogrel 75 MG tablet Commonly known as: PLAVIX Take 1 tablet (75 mg total) by mouth daily.   ezetimibe 10 MG tablet Commonly known as: ZETIA Take 10 mg by mouth daily.   ferrous sulfate 325 (65 FE) MG tablet Take 325 mg  by mouth every morning.   furosemide 20 MG tablet Commonly known as: LASIX TAKE 1 TABLET BY MOUTH EVERY DAY   isosorbide mononitrate 30 MG 24 hr tablet Commonly known as: IMDUR TAKE 1/2 TABLET BY MOUTH DAILY   losartan 50 MG tablet Commonly known as: COZAAR Take 1 tablet (50 mg total) by mouth daily.   methimazole 5 MG tablet Commonly known as: TAPAZOLE Take 1 tablet (5 mg total) by mouth daily.   metoprolol succinate 25 MG 24 hr tablet Commonly known as: Toprol XL Take 1 tablet (25 mg total) by mouth daily.   nitroGLYCERIN 0.4 MG SL tablet Commonly known as: Nitrostat Place 1 tablet (0.4 mg total) under the tongue every 5 (five) minutes as  needed for chest pain.   potassium chloride SA 20 MEQ tablet Commonly known as: KLOR-CON Take 1 tablet (20 mEq total) by mouth daily.   spironolactone 25 MG tablet Commonly known as: ALDACTONE TAKE 1/2 TABLET BY MOUTH EVERY DAY         OBJECTIVE:   PHYSICAL EXAM: VS: LMP 06/19/2017 (Approximate) Comment: over 2 years ago   EXAM: General: Pt appears well and is in NAD  Neck: General: Supple without adenopathy. Thyroid: Thyroid size is prominent.  No nodules appreciated. No thyroid bruit.  Lungs: Clear with good BS bilat with no rales, rhonchi, or wheezes  Heart: Auscultation: RRR.  Extremities:  BL LE: No pretibial edema normal ROM and strength.  Mental Status: Mood and affect: No depression, anxiety, or agitation     DATA REVIEWED:  Results for Amber, [name] (MRN [id number]) as of 04/06/2020 15:11  Ref. Range 04/06/2020 10:11  TSH Latest Ref Range: 0.35 - 4.50 uIU/mL 1.62  T4,Free(Direct) Latest Ref Range: 0.60 - 1.60 ng/dL 9.56      ASSESSMENT / PLAN / RECOMMENDATIONS:   1. Hyperthyroidism Secondary to Graves' Disease:  - Clinically euthyroid - No local neck symptoms  - She has been compliant with methimazole without side effects  -TFT's are normal   Medications  Continue Methimazole 5 mg  daily   2. Graves' Disease:   - She is up to [name] on ophthalmological exam    Addendum: Attempted to call the pt went straight to voice mail. A letter will be sent.   Follow-up in 4 months  Labs in 8 weeks  Signed electronically by: Lyndle Herrlich, MD  [facility] End[facility]  [facility] [address] [place] [place], [place] [address] Phone: [phone] FAX: [phone]      CC: Doreene Nest, NP [address] [place] [address] Phone: [phone]  Fax: [phone]   Return to Endocrinology clinic as below: Future Appointments  [name] Time Provider Department Center  07/29/2020 10:50 AM Dare Spillman, Konrad Dolores, MD [facility] None  01/05/2021  2:40 PM Iran Ouch, MD [facility]

## 2020-08-07 ENCOUNTER — Ambulatory Visit: Payer: Commercial Managed Care - PPO | Admitting: Internal Medicine

## 2020-08-07 ENCOUNTER — Other Ambulatory Visit: Payer: Self-pay | Admitting: Family

## 2020-08-13 ENCOUNTER — Other Ambulatory Visit: Payer: Self-pay | Admitting: *Deleted

## 2020-08-13 ENCOUNTER — Other Ambulatory Visit: Payer: Self-pay | Admitting: Physician Assistant

## 2020-08-13 MED ORDER — SPIRONOLACTONE 25 MG PO TABS
12.5000 mg | ORAL_TABLET | Freq: Every day | ORAL | 0 refills | Status: DC
Start: 2020-08-13 — End: 2020-11-11

## 2020-10-26 ENCOUNTER — Encounter: Payer: Self-pay | Admitting: Internal Medicine

## 2020-10-26 ENCOUNTER — Other Ambulatory Visit: Payer: Self-pay

## 2020-10-26 ENCOUNTER — Ambulatory Visit: Payer: Commercial Managed Care - PPO | Admitting: Internal Medicine

## 2020-10-26 VITALS — BP 130/80 | HR 70 | Ht 64.0 in | Wt 243.5 lb

## 2020-10-26 DIAGNOSIS — E0590 Thyrotoxicosis, unspecified without thyrotoxic crisis or storm: Secondary | ICD-10-CM

## 2020-10-26 LAB — TSH: TSH: 0.1 u[IU]/mL — ABNORMAL LOW (ref 0.35–4.50)

## 2020-10-26 LAB — T4, FREE: Free T4: 0.75 ng/dL (ref 0.60–1.60)

## 2020-10-26 NOTE — Patient Instructions (Signed)
-   Continue Methimazole 5 mg daily - until you hear otherwise from us.  

## 2020-10-26 NOTE — Progress Notes (Signed)
Name: Amber [name]  MRN/ DOB: [id number], 03/03/1971    Age/ Sex: 50 y.o., female     PCP: Amber Nest, NP   Reason for Endocrinology Evaluation: Hyperthytroidism     Initial Endocrinology Clinic Visit: 04/03/2019    PATIENT IDENTIFIER: Amber [name] is a 50 y.o., female with a past medical history of HTN and CAD (S/P PCI 05/2019).She has followed with [facility] since 04/03/2019 for consultative assistance with management of her Hyperthyrodism  HISTORICAL SUMMARY: The patient was first diagnosed with hyperthyroidism in 03/2019, with a suppressed TSH at < 0.01 uIU/mL with elevated FT4 5.96 ng/dL. She did have weight loss  For ~ 4 months prior to her presentation.  Methimazole started in 03/2019  SUBJECTIVE:     Today (10/26/2020):  Amber [name] is here for a follow up on hyperthyroidism.   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   Thyroid storm    Past Surgical History:  Past Surgical History:  Procedure Laterality Date   CARDIAC CATHETERIZATION     CESAREAN SECTION     x2   CORONARY STENT INTERVENTION N/A 05/22/2019   Procedure: CORONARY STENT INTERVENTION;  Surgeon: Amber Ouch, MD;  [facility];  Service: Cardiovascular;  Laterality: N/A;   MASS EXCISION Right 01/18/2013   Procedure: EXCISION right scapular cyst;  Surgeon: Amber Filler, MD;  [facility];  Service: General;  Laterality: Right;   RIGHT/LEFT HEART CATH AND CORONARY ANGIOGRAPHY N/A  05/22/2019   Procedure: RIGHT/LEFT HEART CATH AND CORONARY ANGIOGRAPHY;  Surgeon: Amber Ouch, MD;  [facility];  Service: Cardiovascular;  Laterality: N/A;    Social History:  reports that she has never smoked. She has never used smokeless tobacco. She reports current alcohol use. She reports that she does not use drugs. Family History:  Family History  Problem Relation Age of Onset   Diabetes Father    Diabetes Mother    Heart disease Mother    Diabetes Sister    Hypertension Sister      HOME MEDICATIONS: Allergies as of 10/26/2020   No Known Allergies     Medication List       Accurate as of October 26, 2020 10:14 AM. If you have any questions, ask your nurse or doctor.        aspirin 81 MG chewable tablet Chew 1 tablet (81 mg total) by mouth daily.   atorvastatin 80 MG tablet Commonly known as: LIPITOR TAKE 1 TABLET (80 MG TOTAL) BY MOUTH DAILY AT 6 PM.   calcium-vitamin D 250-100 MG-UNIT tablet Take 1 tablet by mouth 2 (two) times daily.   clopidogrel 75 MG tablet Commonly known as: PLAVIX Take 1 tablet (75 mg total) by mouth daily.   ezetimibe 10 MG tablet Commonly known as: ZETIA Take 10 mg by mouth daily.   ferrous sulfate 325 (65 FE) MG tablet Take 325 mg by mouth every morning.  furosemide 20 MG tablet Commonly known as: LASIX TAKE 1 TABLET BY MOUTH EVERY DAY   isosorbide mononitrate 30 MG 24 hr tablet Commonly known as: IMDUR TAKE 1/2 TABLET BY MOUTH DAILY   losartan 50 MG tablet Commonly known as: COZAAR Take 1 tablet (50 mg total) by mouth daily.   methimazole 5 MG tablet Commonly known as: TAPAZOLE Take 1 tablet (5 mg total) by mouth daily.   metoprolol succinate 25 MG 24 hr tablet Commonly known as: Toprol XL Take 1 tablet (25 mg total) by mouth daily.   nitroGLYCERIN 0.4 MG SL tablet Commonly known as: Nitrostat Place 1 tablet (0.4 mg total) under the tongue every 5 (five) minutes as needed for chest  pain.   potassium chloride SA 20 MEQ tablet Commonly known as: KLOR-CON Take 1 tablet (20 mEq total) by mouth daily.   spironolactone 25 MG tablet Commonly known as: ALDACTONE Take 0.5 tablets (12.5 mg total) by mouth daily.         OBJECTIVE:   PHYSICAL EXAM: VS: BP 130/80    Pulse 70    Ht 5\' 4"  (1.626 m)    Wt 243 lb 8 oz (110.5 kg)    LMP 06/19/2017 (Approximate) Comment: over 2 years ago   SpO2 98%    BMI 41.80 kg/m    EXAM: General: Pt appears well and is in NAD  Neck: General: Supple without adenopathy. Thyroid: Thyroid size is prominent.  No nodules appreciated. No thyroid bruit.  Lungs: Clear with good BS bilat with no rales, rhonchi, or wheezes  Heart: Auscultation: RRR.  Extremities:  BL LE: No pretibial edema normal ROM and strength.  Mental Status: Mood and affect: No depression, anxiety, or agitation     DATA REVIEWED:  Results for Amber, [name] (MRN Amber [name]) as of 10/27/2020 14:12  Ref. Range 04/06/2020 10:11 10/26/2020 10:40  TSH Latest Ref Range: 0.35 - 4.50 uIU/mL 1.62 0.10 (L)  T4,Free(Direct) Latest Ref Range: 0.60 - 1.60 ng/dL 12/24/2020 8.41      ASSESSMENT / PLAN / RECOMMENDATIONS:   1. Hyperthyroidism Secondary to Graves' Disease:  - Clinically euthyroid - No local neck symptoms  -TFT's  Show low TSH and normal FT4 compared to last visit, despite being on the same dose of methimazole, I suspect there's some imprefect adherence.    Medications  Continue Methimazole 5 mg  daily   2. Graves' Disease:   - No extrathyroidal manifestations of graves' disease. She was advised to have an annual eye exam     Follow-up in 4 months    Signed electronically by: 3.24, MD  [facility] Endocrin[facility]  [facility] [address] [place] [place], [place] Phone: [phone] FAX: [phone]      CC: [phone], NP [address] [place] Phone: [phone]  Fax:  [phone]   Return to Endocrinology clinic as below: Future Appointments  Date Time Provider Department Center  01/05/2021  2:40 PM 01/07/2021, MD [facility]

## 2020-10-27 ENCOUNTER — Encounter: Payer: Self-pay | Admitting: Internal Medicine

## 2020-11-11 ENCOUNTER — Other Ambulatory Visit: Payer: Self-pay | Admitting: Physician Assistant

## 2020-11-13 ENCOUNTER — Telehealth: Payer: Self-pay | Admitting: Cardiovascular Disease

## 2020-11-13 MED ORDER — EZETIMIBE 10 MG PO TABS: 10.0000 mg | ORAL_TABLET | Freq: Every day | ORAL | 2 refills | Status: DC

## 2020-11-13 NOTE — Telephone Encounter (Signed)
Received fax from [facility] in [place] requesting refills for Ezetimibe 10 mg. Rx request sent to pharmacy.

## 2020-11-17 ENCOUNTER — Telehealth: Payer: Self-pay

## 2020-11-17 NOTE — Telephone Encounter (Signed)
Left message on patient's voice mail about returned mail from her visit with Dr. Lonzo Cloud in January.  Asked patient to please call our office back with an updated mailing address so we can re-send mail to her.

## 2020-12-09 ENCOUNTER — Other Ambulatory Visit: Payer: Self-pay | Admitting: Physician Assistant

## 2020-12-10 ENCOUNTER — Other Ambulatory Visit: Payer: Self-pay | Admitting: Cardiovascular Disease

## 2020-12-13 ENCOUNTER — Other Ambulatory Visit: Payer: Self-pay | Admitting: Physician Assistant

## 2021-01-05 ENCOUNTER — Other Ambulatory Visit: Payer: Self-pay

## 2021-01-05 ENCOUNTER — Ambulatory Visit (INDEPENDENT_AMBULATORY_CARE_PROVIDER_SITE_OTHER): Payer: Commercial Managed Care - PPO | Admitting: Cardiovascular Disease

## 2021-01-05 ENCOUNTER — Encounter: Payer: Self-pay | Admitting: Cardiovascular Disease

## 2021-01-05 VITALS — BP 118/60 | HR 59 | Ht 64.0 in | Wt 245.5 lb

## 2021-01-05 DIAGNOSIS — E785 Hyperlipidemia, unspecified: Secondary | ICD-10-CM | POA: Diagnosis not present

## 2021-01-05 DIAGNOSIS — I2510 Atherosclerotic heart disease of native coronary artery without angina pectoris: Secondary | ICD-10-CM | POA: Diagnosis not present

## 2021-01-05 DIAGNOSIS — I5032 Chronic diastolic (congestive) heart failure: Secondary | ICD-10-CM

## 2021-01-05 DIAGNOSIS — I10 Essential (primary) hypertension: Secondary | ICD-10-CM

## 2021-01-05 NOTE — Progress Notes (Signed)
Cardiology Office Note   Date:  01/05/2021   ID:  Amber [name], DOB 1970/12/06, MRN [id number]  PCP:  Doreene Nest, NP  Cardiologist:   Lorine Bears, MD   Chief Complaint  Patient presents with  . Other    6 month f/u c/o nose bleed after having massage and had episode 2 wks ago of nose bleed. Meds reviewed verbally with pt.      History of Present Illness: Amber [name] is a 50 y.o. female who presents for follow-up visit regarding coronary artery disease and chronic systolic heart failure.  She has other chronic medical conditions including essential hypertension, history of hypothyroidism with previous thyroid storm, anemia and obesity. She was diagnosed with severe hyperthyroidism in August 2020.  She was hospitalized at that time with acute respiratory failure requiring mechanical ventilation.  She had non-STEMI and pulmonary edema.  Echocardiogram showed an EF of 25 to 30% with wall motion abnormalities suggestive of Takotsubo cardiomyopathy versus anterior infarct.  She underwent a right and left cardiac catheterization and was found to have two-vessel coronary artery disease with 80% stenosis in the mid LAD, occluded OM branches and moderate RCA disease.  PCI and drug-eluting stent placement was done to the mid LAD.  Heart failure medications were optimized and hypothyroidism was treated.  Repeat echocardiogram in October showed an EF of 40 to 45%. She was hospitalized in April, 2021 with atypical chest pain and ruled out for myocardial infarction.  Imdur was added.  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  Echocardiogram showed an EF of 50 to 55% with no significant valvular abnormalities.  During last visit, she was switched from Brilinta to clopidogrel.  She reports recent episodes of epistaxis.  No chest pain or shortness of breath.  She takes her medications regularly.  She is frustrated about her  weight gain.   Past Medical History:  Diagnosis Date  . Acute encephalopathy   . Anemia   . CAD (coronary artery disease)   . HFrEF (heart failure with reduced ejection fraction) (HCC)   . HTN (hypertension)   . Hyperthyroidism   . 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 Thyroid storm     Past Surgical History:  Procedure Laterality Date  . CARDIAC CATHETERIZATION    . CESAREAN SECTION     x2  . CORONARY STENT INTERVENTION N/A 05/22/2019   Procedure: CORONARY STENT INTERVENTION;  Surgeon: Iran Ouch, MD;  [facility];  Service: Cardiovascular;  Laterality: N/A;  . MASS EXCISION Right 01/18/2013   Procedure: EXCISION right scapular cyst;  Surgeon: Axel Filler, MD;  Location: [facility];  Service: General;  Laterality: Right;  . RIGHT/LEFT HEART CATH AND CORONARY ANGIOGRAPHY N/A 05/22/2019   Procedure: RIGHT/LEFT HEART CATH AND CORONARY ANGIOGRAPHY;  Surgeon: Iran Ouch, MD;  [facility];  Service: Cardiovascular;  Laterality: N/A;     Current Outpatient Medications  Medication Sig Dispense Refill  . aspirin 81 MG chewable tablet Chew 1 tablet (81 mg total) by mouth daily. 30 tablet 11  . atorvastatin (LIPITOR) 80 MG tablet TAKE 1 TABLET BY MOUTH DAILY AT 6 PM. 30 tablet 0  . calcium-vitamin D 250-100 MG-UNIT tablet Take 1 tablet by mouth 2 (two) times daily.    . clopidogrel (PLAVIX) 75 MG tablet Take 1 tablet (75 mg total) by mouth daily. 90 tablet 2  . ezetimibe (ZETIA) 10 MG tablet Take 1  tablet (10 mg total) by mouth daily. 90 tablet 2  . ferrous sulfate 325 (65 FE) MG tablet Take 325 mg by mouth every morning.    . furosemide (LASIX) 20 MG tablet TAKE 1 TABLET BY MOUTH EVERY DAY 30 tablet 4  . isosorbide mononitrate (IMDUR) 30 MG 24 hr tablet TAKE 1/2 TABLET BY MOUTH DAILY 45 tablet 2  . losartan (COZAAR) 50 MG tablet TAKE 1 TABLET BY MOUTH EVERY DAY 30 tablet 0  . methimazole (TAPAZOLE) 5 MG tablet Take 1 tablet (5 mg total) by mouth  daily. 30 tablet 6  . metoprolol succinate (TOPROL XL) 25 MG 24 hr tablet Take 1 tablet (25 mg total) by mouth daily. 90 tablet 3  . nitroGLYCERIN (NITROSTAT) 0.4 MG SL tablet Place 1 tablet (0.4 mg total) under the tongue every 5 (five) minutes as needed for chest pain. 25 tablet 1  . potassium chloride SA (K-DUR) 20 MEQ tablet Take 1 tablet (20 mEq total) by mouth daily. 90 tablet 3  . spironolactone (ALDACTONE) 25 MG tablet TAKE 1/2 TABLET BY MOUTH EVERY DAY 15 tablet 0   No current facility-administered medications for this visit.    Allergies:   Patient has no known allergies.    Social History:  The patient  reports that she has never smoked. She has never used smokeless tobacco. She reports current alcohol use. She reports that she does not use drugs.   Family History:  The patient's family history includes Diabetes in her father, mother, and sister; Heart disease in her mother; Hypertension in her sister.    ROS:  Please see the history of present illness.   Otherwise, review of systems are positive for none.   All other systems are reviewed and negative.    PHYSICAL EXAM: VS:  BP 118/60 (BP Location: Left Arm, Patient Position: Sitting, Cuff Size: Large)   Pulse (!) 59   Ht 5\' 4"  (1.626 m)   Wt 245 lb 8 oz (111.4 kg)   LMP 06/19/2017 (Approximate) Comment: over 2 years ago  SpO2 98%   BMI 42.14 kg/m  , BMI Body mass index is 42.14 kg/m. GEN: Well nourished, well developed, in no acute distress  HEENT: normal  Neck: no JVD, carotid bruits, or masses Cardiac: RRR; no murmurs, rubs, or gallops,no edema  Respiratory:  clear to auscultation bilaterally, normal work of breathing GI: soft, nontender, nondistended, + BS MS: no deformity or atrophy  Skin: warm and dry, no rash Neuro:  Strength and sensation are intact Psych: euthymic mood, full affect   EKG:  EKG is ordered today. The ekg ordered today demonstrates sinus bradycardia with moderate LVH and possible old  inferior infarct.  Recent Labs: 02/12/2020: B Natriuretic Peptide 31.0; BUN 17; Creatinine, Ser 0.89; Potassium 3.4; Sodium 140 02/13/2020: Hemoglobin 12.7; Platelets 283 10/26/2020: TSH 0.10    Lipid Panel    Component Value Date/Time   CHOL 140 12/09/2019 1420   TRIG 71 12/09/2019 1420   HDL 52 12/09/2019 1420   CHOLHDL 2.7 12/09/2019 1420   VLDL 14 12/09/2019 1420   LDLCALC 74 12/09/2019 1420      Wt Readings from Last 3 Encounters:  01/05/21 245 lb 8 oz (111.4 kg)  10/26/20 243 lb 8 oz (110.5 kg)  06/11/20 234 lb 12.8 oz (106.5 kg)       No flowsheet data found.    ASSESSMENT AND PLAN:  1.  Coronary artery disease involving native coronary arteries: She is doing very well  with no anginal symptoms.  It has been more than 1 year since PCI and drug-eluting stent placement.  I elected to discontinue clopidogrel especially with recurrent epistaxis.  Continue aspirin indefinitely.  2.  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  Continue losartan, Toprol and spironolactone.  Check basic metabolic profile today.  3.  Thyroid disease: Managed by endocrinology.  4 essential hypertension: Blood pressure is well controlled  5.  Hyperlipidemia: Continue high-dose atorvastatin and Zetia.  Check lipid and liver profile.    Disposition:   FU with me in 6 months  Signed,  Lorine Bears, MD  01/05/2021 2:39 PM    [facility]

## 2021-01-05 NOTE — Patient Instructions (Signed)
Medication Instructions:  - Your physician has recommended you make the following change in your medication:   1) STOP plavix  *If you need a refill on your cardiac medications before your next appointment, please call your pharmacy*   Lab Work: - Your physician recommends that you have lab work today: CMET/ Lipid  If you have labs (blood work) drawn today and your tests are completely normal, you will receive your results only by: Marland Kitchen MyChart Message (if you have MyChart) OR . A paper copy in the mail If you have any lab test that is abnormal or we need to change your treatment, we will call you to review the results.   Testing/Procedures: - none ordered   Follow-Up: At [facility], you and your health needs are our priority.  As part of our continuing mission to provide you with exceptional heart care, we have created designated Provider Care Teams.  These Care Teams include your primary Cardiologist (physician) and Advanced Practice Providers (APPs -  Physician Assistants and Nurse Practitioners) who all work together to provide you with the care you need, when you need it.  We recommend signing up for the patient portal called "MyChart".  Sign up information is provided on this After Visit Summary.  MyChart is used to connect with patients for Virtual Visits (Telemedicine).  Patients are able to view lab/test results, encounter notes, upcoming appointments, etc.  Non-urgent messages can be sent to your provider as well.   To learn more about what you can do with MyChart, go to [email or web address].    Your next appointment:   6 month(s)  The format for your next appointment:   In Person  Provider:   You may see Lorine Bears, MD or one of the following Advanced Practice Providers on your designated Care Team:    Nicolasa Ducking, NP  Eula Listen, PA-C  Marisue Ivan, PA-C  Cadence [place], [place]  Gillian Shields, NP    Other Instructions

## 2021-01-06 LAB — COMPREHENSIVE METABOLIC PANEL
ALT: 18 IU/L (ref 0–32)
AST: 21 IU/L (ref 0–40)
Albumin/Globulin Ratio: 1.5 (ref 1.2–2.2)
Albumin: 4.6 g/dL (ref 3.8–4.8)
Alkaline Phosphatase: 91 IU/L (ref 44–121)
BUN/Creatinine Ratio: 18 (ref 9–23)
BUN: 11 mg/dL (ref 6–24)
Bilirubin Total: 0.2 mg/dL (ref 0.0–1.2)
CO2: 23 mmol/L (ref 20–29)
Calcium: 9.6 mg/dL (ref 8.7–10.2)
Chloride: 102 mmol/L (ref 96–106)
Creatinine, Ser: 0.61 mg/dL (ref 0.57–1.00)
Globulin, Total: 3 g/dL (ref 1.5–4.5)
Glucose: 107 mg/dL — ABNORMAL HIGH (ref 65–99)
Potassium: 4.1 mmol/L (ref 3.5–5.2)
Sodium: 142 mmol/L (ref 134–144)
Total Protein: 7.6 g/dL (ref 6.0–8.5)
eGFR: 109 mL/min/{1.73_m2} (ref >59)

## 2021-01-06 LAB — LIPID PANEL
Chol/HDL Ratio: 3.6 ratio (ref 0.0–4.4)
Cholesterol, Total: 176 mg/dL (ref 100–199)
HDL: 49 mg/dL (ref >39)
LDL Chol Calc (NIH): 98 mg/dL (ref 0–99)
Triglycerides: 169 mg/dL — ABNORMAL HIGH (ref 0–149)
VLDL Cholesterol Cal: 29 mg/dL (ref 5–40)

## 2021-01-07 ENCOUNTER — Telehealth: Payer: Self-pay

## 2021-01-07 NOTE — Telephone Encounter (Signed)
Attempted to reach pt, LMTCB on VM regarding recent labs, advised to call back for clarification on her cholesterol medications and if she was fasting before lipid were drawn.

## 2021-01-09 ENCOUNTER — Other Ambulatory Visit: Payer: Self-pay | Admitting: Physician Assistant

## 2021-01-09 ENCOUNTER — Other Ambulatory Visit: Payer: Self-pay | Admitting: Cardiovascular Disease

## 2021-01-10 ENCOUNTER — Other Ambulatory Visit: Payer: Self-pay | Admitting: Family

## 2021-01-10 ENCOUNTER — Other Ambulatory Visit: Payer: Self-pay | Admitting: Internal Medicine

## 2021-01-11 NOTE — Telephone Encounter (Signed)
Rx request sent to pharmacy.  

## 2021-01-12 ENCOUNTER — Encounter: Payer: Self-pay | Admitting: *Deleted

## 2021-01-12 NOTE — Telephone Encounter (Signed)
Per results note by Gillian Shields, NP on 01/06/21:   Seen by Dr. Kirke Corin yesterday. Lab work with normal electrolytes, liver function, kidney function. Lipid panel collected that shows triglycerides and LDL are above goal.   Please inquire if  1. Lab work was fasting? If not, repeat fasting.  2. If she is taking Atorvastatin 80mg 10mg  regularly. If not, resume and repeat labs in 8 weeks. If she is, recommend referral to lipid clinic.  Attempted to call pt to discuss above. No answer. Lmtcb.

## 2021-01-12 NOTE — Telephone Encounter (Signed)
Erroneous encounter

## 2021-01-13 ENCOUNTER — Telehealth: Payer: Self-pay | Admitting: *Deleted

## 2021-01-13 DIAGNOSIS — E785 Hyperlipidemia, unspecified: Secondary | ICD-10-CM

## 2021-01-13 DIAGNOSIS — Z79899 Other long term (current) drug therapy: Secondary | ICD-10-CM

## 2021-01-13 NOTE — Telephone Encounter (Signed)
Spoke to pt. Notified of lab results.  She states she was not fasting at time of lab work.  Also confirmed that she does take Atorvastatin 80mg  and Zetia 10mg  daily.  Pt will have repeat [facility]  Orders placed. Pt has no further questions at this time.

## 2021-01-13 NOTE — Telephone Encounter (Signed)
Sounds like a great plan. Thank you so much for coordinating! Alver Sorrow, NP

## 2021-01-13 NOTE — Telephone Encounter (Signed)
-----   Message from Alver Sorrow, NP sent at 01/06/2021  7:56 AM EDT ----- Seen by Dr. Kirke Corin yesterday. Lab work with normal electrolytes, liver function, kidney function. Lipid panel collected that shows triglycerides and LDL are above goal.   Please inquire if  1. Lab work was fasting? If not, repeat fasting.  2. If she is taking Atorvastatin 80mg 10mg  regularly. If not, resume and repeat labs in 8 weeks. If she is, recommend referral to lipid clinic.

## 2021-01-18 ENCOUNTER — Other Ambulatory Visit
Admission: RE | Admit: 2021-01-18 | Discharge: 2021-01-18 | Disposition: A | Discharge status: Home / Self Care | Payer: Commercial Managed Care - PPO | Attending: Family | Admitting: Family

## 2021-01-18 DIAGNOSIS — E785 Hyperlipidemia, unspecified: Secondary | ICD-10-CM

## 2021-01-18 DIAGNOSIS — Z79899 Other long term (current) drug therapy: Secondary | ICD-10-CM | POA: Insufficient documentation

## 2021-01-18 LAB — LIPID PANEL
Cholesterol: 179 mg/dL (ref 0–200)
HDL: 48 mg/dL (ref >40)
LDL Cholesterol: 110 mg/dL — ABNORMAL HIGH (ref 0–99)
Total CHOL/HDL Ratio: 3.7 RATIO
Triglycerides: 107 mg/dL (ref <150)
VLDL: 21 mg/dL (ref 0–40)

## 2021-01-21 ENCOUNTER — Telehealth: Payer: Self-pay | Admitting: *Deleted

## 2021-01-21 DIAGNOSIS — E785 Hyperlipidemia, unspecified: Secondary | ICD-10-CM

## 2021-01-21 DIAGNOSIS — Z79899 Other long term (current) drug therapy: Secondary | ICD-10-CM

## 2021-01-21 NOTE — Telephone Encounter (Signed)
-----   Message from Caitlin S Walker, NP sent at 01/18/2021 11:30 AM EDT ----- LDL remains above goal of 70. Please ensure she is not missing doses of Atorvastatin or Zetia. Recommend referral to lipid clinic for discussion of Nexlizet vs PCSK9i. 

## 2021-01-21 NOTE — Telephone Encounter (Signed)
Attempted to call pt to review lab results and provider's recc.  No answer. Lmtcb.  

## 2021-01-27 NOTE — Telephone Encounter (Signed)
Attempted to call the patient. No answer- I left a message to please call back.  

## 2021-01-28 NOTE — Telephone Encounter (Signed)
Spoke to pt. Notified of lab results and provider's recc.  Pt is agreeable to referral to lipid clinic.  Notified pt she will receive a call to establish consult.  Pt verbalized understanding and has no further questions.

## 2021-01-28 NOTE — Telephone Encounter (Signed)
-----   Message from Alver Sorrow, NP sent at 01/18/2021 11:30 AM EDT ----- LDL remains above goal of 70. Please ensure she is not missing doses of Atorvastatin or Zetia. Recommend referral to lipid clinic for discussion of Nexlizet vs PCSK9i.

## 2021-02-01 ENCOUNTER — Other Ambulatory Visit: Payer: Self-pay | Admitting: Internal Medicine

## 2021-02-01 DIAGNOSIS — Z1231 Encounter for screening mammogram for malignant neoplasm of breast: Secondary | ICD-10-CM

## 2021-02-16 ENCOUNTER — Encounter: Payer: Self-pay | Admitting: *Deleted

## 2021-02-24 ENCOUNTER — Ambulatory Visit: Payer: Commercial Managed Care - PPO | Admitting: Internal Medicine

## 2021-02-24 ENCOUNTER — Other Ambulatory Visit: Payer: Self-pay

## 2021-02-24 VITALS — BP 134/88 | HR 78 | Ht 64.0 in | Wt 245.0 lb

## 2021-02-24 DIAGNOSIS — E0590 Thyrotoxicosis, unspecified without thyrotoxic crisis or storm: Secondary | ICD-10-CM | POA: Diagnosis not present

## 2021-02-24 LAB — TSH: TSH: 0.41 u[IU]/mL (ref 0.35–4.50)

## 2021-02-24 LAB — T4, FREE: Free T4: 0.83 ng/dL (ref 0.60–1.60)

## 2021-02-24 NOTE — Progress Notes (Signed)
Name: Amber [name]  MRN/ DOB: [id number], 21-Jul-1971    Age/ Sex: 50 y.o., female     PCP: Doreene Nest, NP   Reason for Endocrinology Evaluation: Hyperthytroidism     Initial Endocrinology Clinic Visit: 04/03/2019    PATIENT IDENTIFIER: Amber [name] is a 50 y.o., female with a past medical history of HTN and CAD (S/P PCI 05/2019).She has followed with [facility] since 04/03/2019 for consultative assistance with management of her Hyperthyrodism  HISTORICAL SUMMARY: The patient was first diagnosed with hyperthyroidism in 03/2019, with a suppressed TSH at < 0.01 uIU/mL with elevated FT4 5.96 ng/dL. She did have weight loss  For ~ 4 months prior to her presentation.  Methimazole started in 03/2019  SUBJECTIVE:     Today (02/24/2021):  Ms. [name] is here for a follow up on hyperthyroidism.   She has been noted with weight gain Has been snoring and not sleeping good Denies fever or abdominal pain, denies diarrhea  No burring or itching in the eyes Denies palpitations and chest pain  No local neck symptoms   Last eye exam 08/2020  Methimazole 5 mg daily     HISTORY:  Past Medical History:  Past Medical History:  Diagnosis Date  . Acute encephalopathy   . Anemia   . CAD (coronary artery disease)   . HFrEF (heart failure with reduced ejection fraction) (HCC)   . HTN (hypertension)   . Hyperthyroidism   . 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 Thyroid storm    Past Surgical History:  Past Surgical History:  Procedure Laterality Date  . CARDIAC CATHETERIZATION    . CESAREAN SECTION     x2  . CORONARY STENT INTERVENTION N/A 05/22/2019   Procedure: CORONARY STENT INTERVENTION;  Surgeon: Iran Ouch, MD;  [facility];  Service: Cardiovascular;  Laterality: N/A;  . MASS EXCISION Right 01/18/2013   Procedure: EXCISION right scapular cyst;  Surgeon: Axel Filler, MD;  Location: [facility];  Service: General;  Laterality: Right;  .  RIGHT/LEFT HEART CATH AND CORONARY ANGIOGRAPHY N/A 05/22/2019   Procedure: RIGHT/LEFT HEART CATH AND CORONARY ANGIOGRAPHY;  Surgeon: Iran Ouch, MD;  [facility];  Service: Cardiovascular;  Laterality: N/A;    Social History:  reports that she has never smoked. She has never used smokeless tobacco. She reports current alcohol use. She reports that she does not use drugs. Family History:  Family History  Problem Relation Age of Onset  . Diabetes Father   . Diabetes Mother   . Heart disease Mother   . Diabetes Sister   . Hypertension Sister      HOME MEDICATIONS: Allergies as of 02/24/2021   No Known Allergies     Medication List       Accurate as of Feb 24, 2021 10:59 AM. If you have any questions, ask your nurse or doctor.        aspirin 81 MG chewable tablet Chew 1 tablet (81 mg total) by mouth daily.   atorvastatin 80 MG tablet Commonly known as: LIPITOR TAKE 1 TABLET BY MOUTH DAILY AT 6 PM.   calcium-vitamin D 250-100 MG-UNIT tablet Take 1 tablet by mouth 2 (two) times daily.   ezetimibe 10 MG tablet Commonly known as: ZETIA Take 1 tablet (10 mg total) by mouth daily.   ferrous sulfate 325 (65 FE) MG tablet Take 325 mg by mouth every morning.   furosemide 20 MG tablet Commonly known as: LASIX  TAKE 1 TABLET BY MOUTH EVERY DAY   isosorbide mononitrate 30 MG 24 hr tablet Commonly known as: IMDUR TAKE 1/2 TABLET BY MOUTH DAILY   losartan 50 MG tablet Commonly known as: COZAAR TAKE 1 TABLET BY MOUTH EVERY DAY   methimazole 5 MG tablet Commonly known as: TAPAZOLE TAKE 1 TABLET BY MOUTH EVERY DAY   metoprolol succinate 25 MG 24 hr tablet Commonly known as: Toprol XL Take 1 tablet (25 mg total) by mouth daily.   nitroGLYCERIN 0.4 MG SL tablet Commonly known as: Nitrostat Place 1 tablet (0.4 mg total) under the tongue every 5 (five) minutes as needed for chest pain.   potassium chloride SA 20 MEQ tablet Commonly known as:  KLOR-CON Take 1 tablet (20 mEq total) by mouth daily.   spironolactone 25 MG tablet Commonly known as: ALDACTONE TAKE 1/2 TABLET BY MOUTH EVERY DAY         OBJECTIVE:   PHYSICAL EXAM: VS: BP (!) 140/101 (BP Location: Left Arm, Patient Position: Sitting, Cuff Size: Large)   Pulse 78   Ht 5\' 4"  (1.626 m)   Wt 245 lb (111.1 kg)   LMP 06/19/2017 (Approximate) Comment: over 2 years ago  SpO2 98%   BMI 42.05 kg/m    EXAM: General: Pt appears well and is in NAD  Neck: General: Supple without adenopathy. Thyroid: Thyroid size is prominent.  No nodules appreciated. No thyroid bruit.  Lungs: Clear with good BS bilat with no rales, rhonchi, or wheezes  Heart: Auscultation: RRR.  Extremities:  BL LE: No pretibial edema normal ROM and strength.  Mental Status: Mood and affect: No depression, anxiety, or agitation     DATA REVIEWED:   Results for ALVEDA, VANHORNE (MRN Amber [name]) as of 02/25/2021 12:21  Ref. Range 02/24/2021 11:25  TSH Latest Ref Range: 0.35 - 4.50 uIU/mL 0.41  T4,Free(Direct) Latest Ref Range: 0.60 - 1.60 ng/dL 04/26/2021    ASSESSMENT / PLAN / RECOMMENDATIONS:   1. Hyperthyroidism Secondary to Graves' Disease:  - Pt continues with weight gain  - No local neck symptoms  - Discussed options of RAI ablation vs surgery vs continuing on Methimazole . After discussed the risk and benefit  Of each option and given CAD history, we have opted to continue with methimazole  -TFT's     Medications  Continue Methimazole 5 mg  daily   2. Graves' Disease:   - No extrathyroidal manifestations of graves' disease.    Follow-up in 4 months    Signed electronically by: 2.54, MD  [facility]  [facility] [address] [place] [place], [place] Phone: [phone] FAX: [phone]      CC: [phone], NP [address] [place] Phone: [phone]  Fax: [phone]   Return to  Endocrinology clinic as below: Future Appointments  Date Time Provider Department Center  02/24/2021 11:10 AM Harjas Biggins, 04/26/2021, MD [facility] None  03/23/2021  9:50 AM [facility]

## 2021-02-24 NOTE — Progress Notes (Deleted)
Name: Amber [name]  MRN/ DOB: [id number], 1971/06/23    Age/ Sex: 50 y.o., female     PCP: Doreene Nest, NP   Reason for Endocrinology Evaluation: Hyperthytroidism     Initial Endocrinology Clinic Visit: 04/03/2019    PATIENT IDENTIFIER: Amber [name] is a 50 y.o., female with a past medical history of HTN and CAD (S/P PCI 05/2019).She has followed with [facility] since 04/03/2019 for consultative assistance with management of her Hyperthyrodism  HISTORICAL SUMMARY: The patient was first diagnosed with hyperthyroidism in 03/2019, with a suppressed TSH at < 0.01 uIU/mL with elevated FT4 5.96 ng/dL. She did have weight loss  For ~ 4 months prior to her presentation.  Methimazole started in 03/2019  SUBJECTIVE:     Today (02/24/2021):  Amber [name] is here for a follow up on hyperthyroidism.   Patient has noted weight gain Has occasional neck swelling  Denies fever or abdominal pain, denies diarrhea   No burring or itching in the eyes No local neck symptoms  Last eye exam 08/2019  Methimazole 5 mg daily     HISTORY:  Past Medical History:  Past Medical History:  Diagnosis Date  . Acute encephalopathy   . Anemia   . CAD (coronary artery disease)   . HFrEF (heart failure with reduced ejection fraction) (HCC)   . HTN (hypertension)   . Hyperthyroidism   . 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 Thyroid storm    Past Surgical History:  Past Surgical History:  Procedure Laterality Date  . CARDIAC CATHETERIZATION    . CESAREAN SECTION     x2  . CORONARY STENT INTERVENTION N/A 05/22/2019   Procedure: CORONARY STENT INTERVENTION;  Surgeon: Iran Ouch, MD;  [facility];  Service: Cardiovascular;  Laterality: N/A;  . MASS EXCISION Right 01/18/2013   Procedure: EXCISION right scapular cyst;  Surgeon: Axel Filler, MD;  Location: [facility];  Service: General;  Laterality: Right;  . RIGHT/LEFT HEART CATH AND CORONARY ANGIOGRAPHY N/A  05/22/2019   Procedure: RIGHT/LEFT HEART CATH AND CORONARY ANGIOGRAPHY;  Surgeon: Iran Ouch, MD;  [facility];  Service: Cardiovascular;  Laterality: N/A;    Social History:  reports that she has never smoked. She has never used smokeless tobacco. She reports current alcohol use. She reports that she does not use drugs. Family History:  Family History  Problem Relation Age of Onset  . Diabetes Father   . Diabetes Mother   . Heart disease Mother   . Diabetes Sister   . Hypertension Sister      HOME MEDICATIONS: Allergies as of 02/24/2021   No Known Allergies     Medication List       Accurate as of Feb 24, 2021  7:14 AM. If you have any questions, ask your nurse or doctor.        aspirin 81 MG chewable tablet Chew 1 tablet (81 mg total) by mouth daily.   atorvastatin 80 MG tablet Commonly known as: LIPITOR TAKE 1 TABLET BY MOUTH DAILY AT 6 PM.   calcium-vitamin D 250-100 MG-UNIT tablet Take 1 tablet by mouth 2 (two) times daily.   ezetimibe 10 MG tablet Commonly known as: ZETIA Take 1 tablet (10 mg total) by mouth daily.   ferrous sulfate 325 (65 FE) MG tablet Take 325 mg by mouth every morning.   furosemide 20 MG tablet Commonly known as: LASIX TAKE 1 TABLET BY MOUTH EVERY DAY  isosorbide mononitrate 30 MG 24 hr tablet Commonly known as: IMDUR TAKE 1/2 TABLET BY MOUTH DAILY   losartan 50 MG tablet Commonly known as: COZAAR TAKE 1 TABLET BY MOUTH EVERY DAY   methimazole 5 MG tablet Commonly known as: TAPAZOLE TAKE 1 TABLET BY MOUTH EVERY DAY   metoprolol succinate 25 MG 24 hr tablet Commonly known as: Toprol XL Take 1 tablet (25 mg total) by mouth daily.   nitroGLYCERIN 0.4 MG SL tablet Commonly known as: Nitrostat Place 1 tablet (0.4 mg total) under the tongue every 5 (five) minutes as needed for chest pain.   potassium chloride SA 20 MEQ tablet Commonly known as: KLOR-CON Take 1 tablet (20 mEq total) by mouth daily.    spironolactone 25 MG tablet Commonly known as: ALDACTONE TAKE 1/2 TABLET BY MOUTH EVERY DAY         OBJECTIVE:   PHYSICAL EXAM: VS: LMP 06/19/2017 (Approximate) Comment: over 2 years ago   EXAM: General: Pt appears well and is in NAD  Neck: General: Supple without adenopathy. Thyroid: Thyroid size is prominent.  No nodules appreciated. No thyroid bruit.  Lungs: Clear with good BS bilat with no rales, rhonchi, or wheezes  Heart: Auscultation: RRR.  Extremities:  BL LE: No pretibial edema normal ROM and strength.  Mental Status: Mood and affect: No depression, anxiety, or agitation     DATA REVIEWED:  Results for Amber [name], Amber [name] (MRN [id number]) as of 10/27/2020 14:12  Ref. Range 04/06/2020 10:11 10/26/2020 10:40  TSH Latest Ref Range: 0.35 - 4.50 uIU/mL 1.62 0.10 (L)  T4,Free(Direct) Latest Ref Range: 0.60 - 1.60 ng/dL 3.87 5.64      ASSESSMENT / PLAN / RECOMMENDATIONS:   1. Hyperthyroidism Secondary to Graves' Disease:  - Clinically euthyroid - No local neck symptoms  -TFT's  Show low TSH and normal FT4 compared to last visit, despite being on the same dose of methimazole, I suspect there's some imprefect adherence.    Medications  Continue Methimazole 5 mg  daily   2. Graves' Disease:   - No extrathyroidal manifestations of graves' disease. She was advised to have an annual eye exam     Follow-up in 4 months    Signed electronically by: Lyndle Herrlich, MD  [facility]  [facility] [address] [place] [place], [place] [address] Phone: [phone] FAX: [phone]      CC: Doreene Nest, NP [address] [place] [address] Phone: [phone]  Fax: [phone]   Return to Endocrinology clinic as below: Future Appointments  Date Time Provider Department Center  02/24/2021 10:50 AM Davyon Fisch, Konrad Dolores, MD [facility] None  03/23/2021  9:50 AM [facility] 3 [facility]

## 2021-02-25 ENCOUNTER — Encounter: Payer: Self-pay | Admitting: Internal Medicine

## 2021-02-25 MED ORDER — METHIMAZOLE 5 MG PO TABS: 5.0000 mg | ORAL_TABLET | Freq: Every day | ORAL | 3 refills | Status: DC

## 2021-03-23 ENCOUNTER — Ambulatory Visit: Payer: Commercial Managed Care - PPO

## 2021-05-10 IMAGING — CR CHEST - 2 VIEW
2 series · 2 of 2 positions shown · non-contrast
Comparison: Radiograph 01/01/2015, 01/17/2013

CLINICAL DATA: Chest pain and shortness of breath for 3 days

EXAM:
CHEST - 2 VIEW

[chest pa]
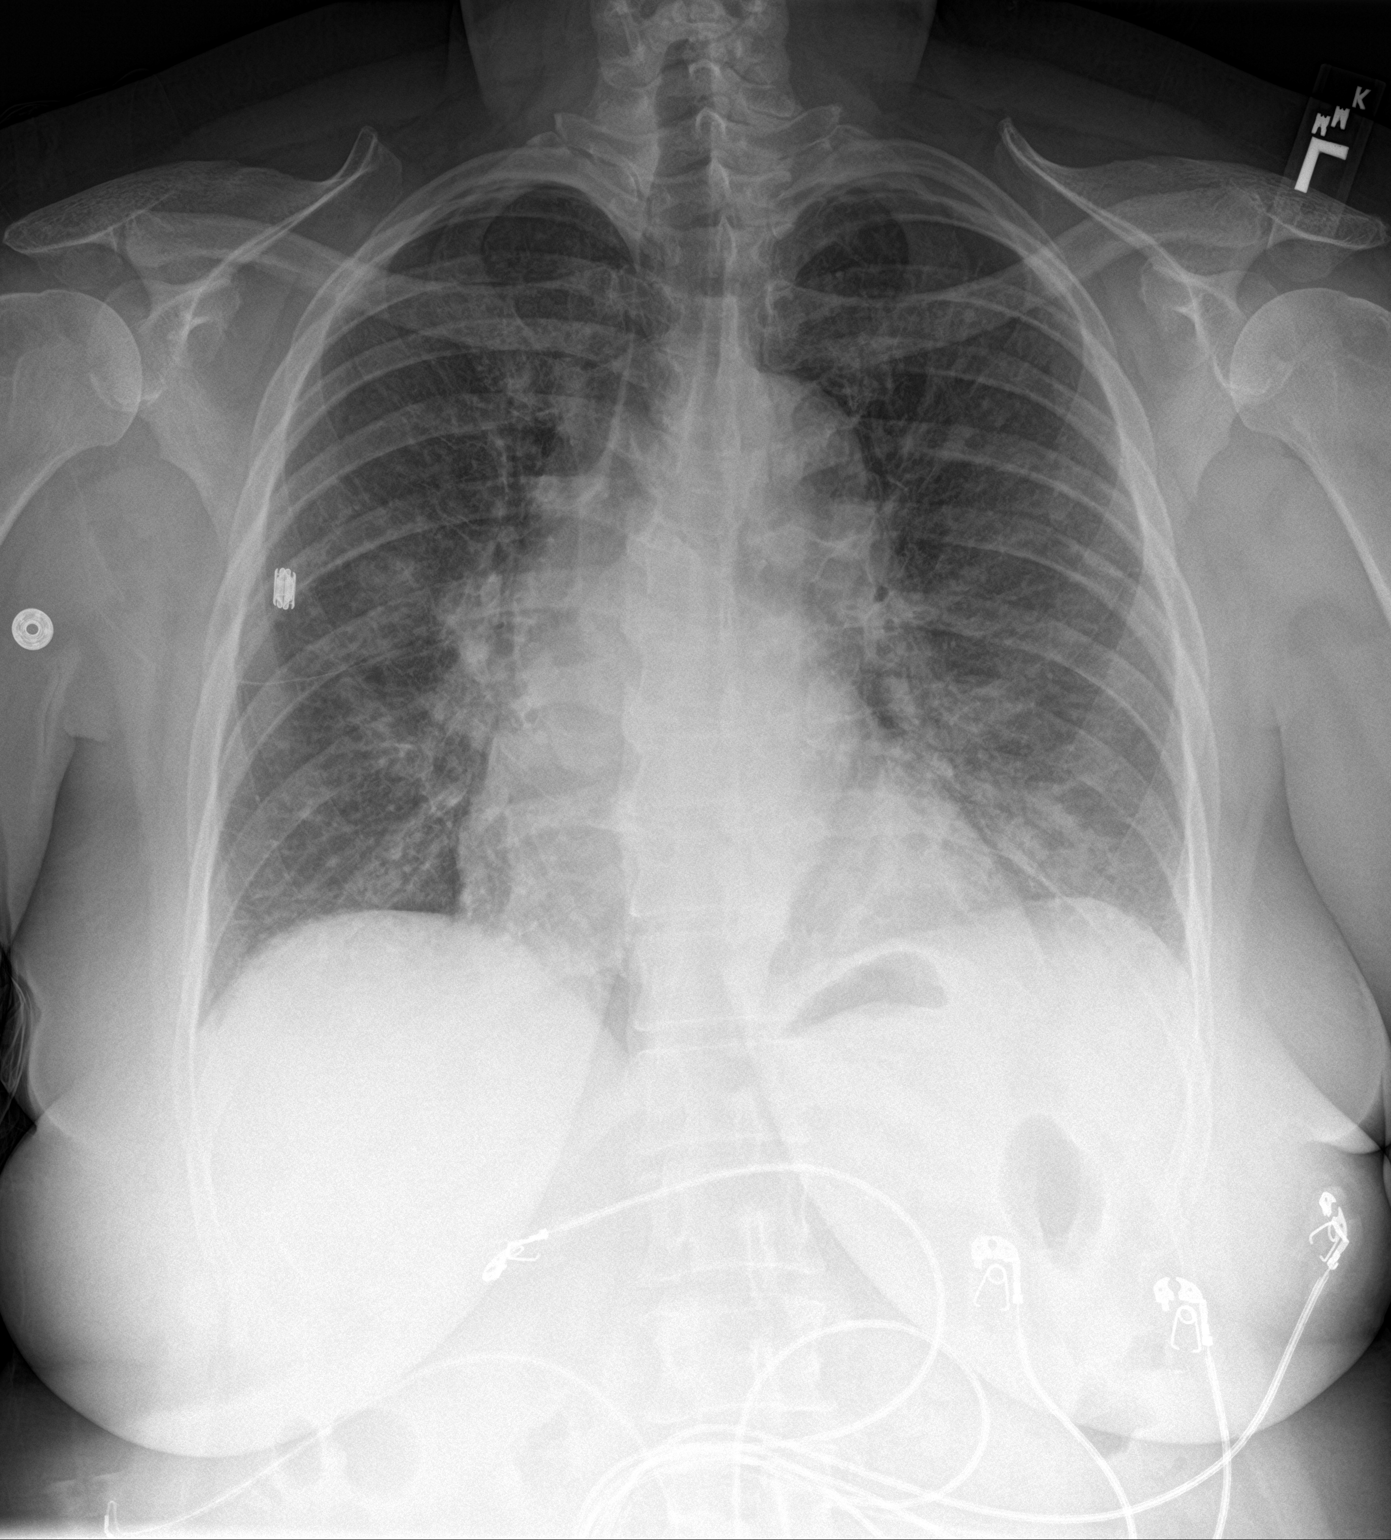

[chest lat]
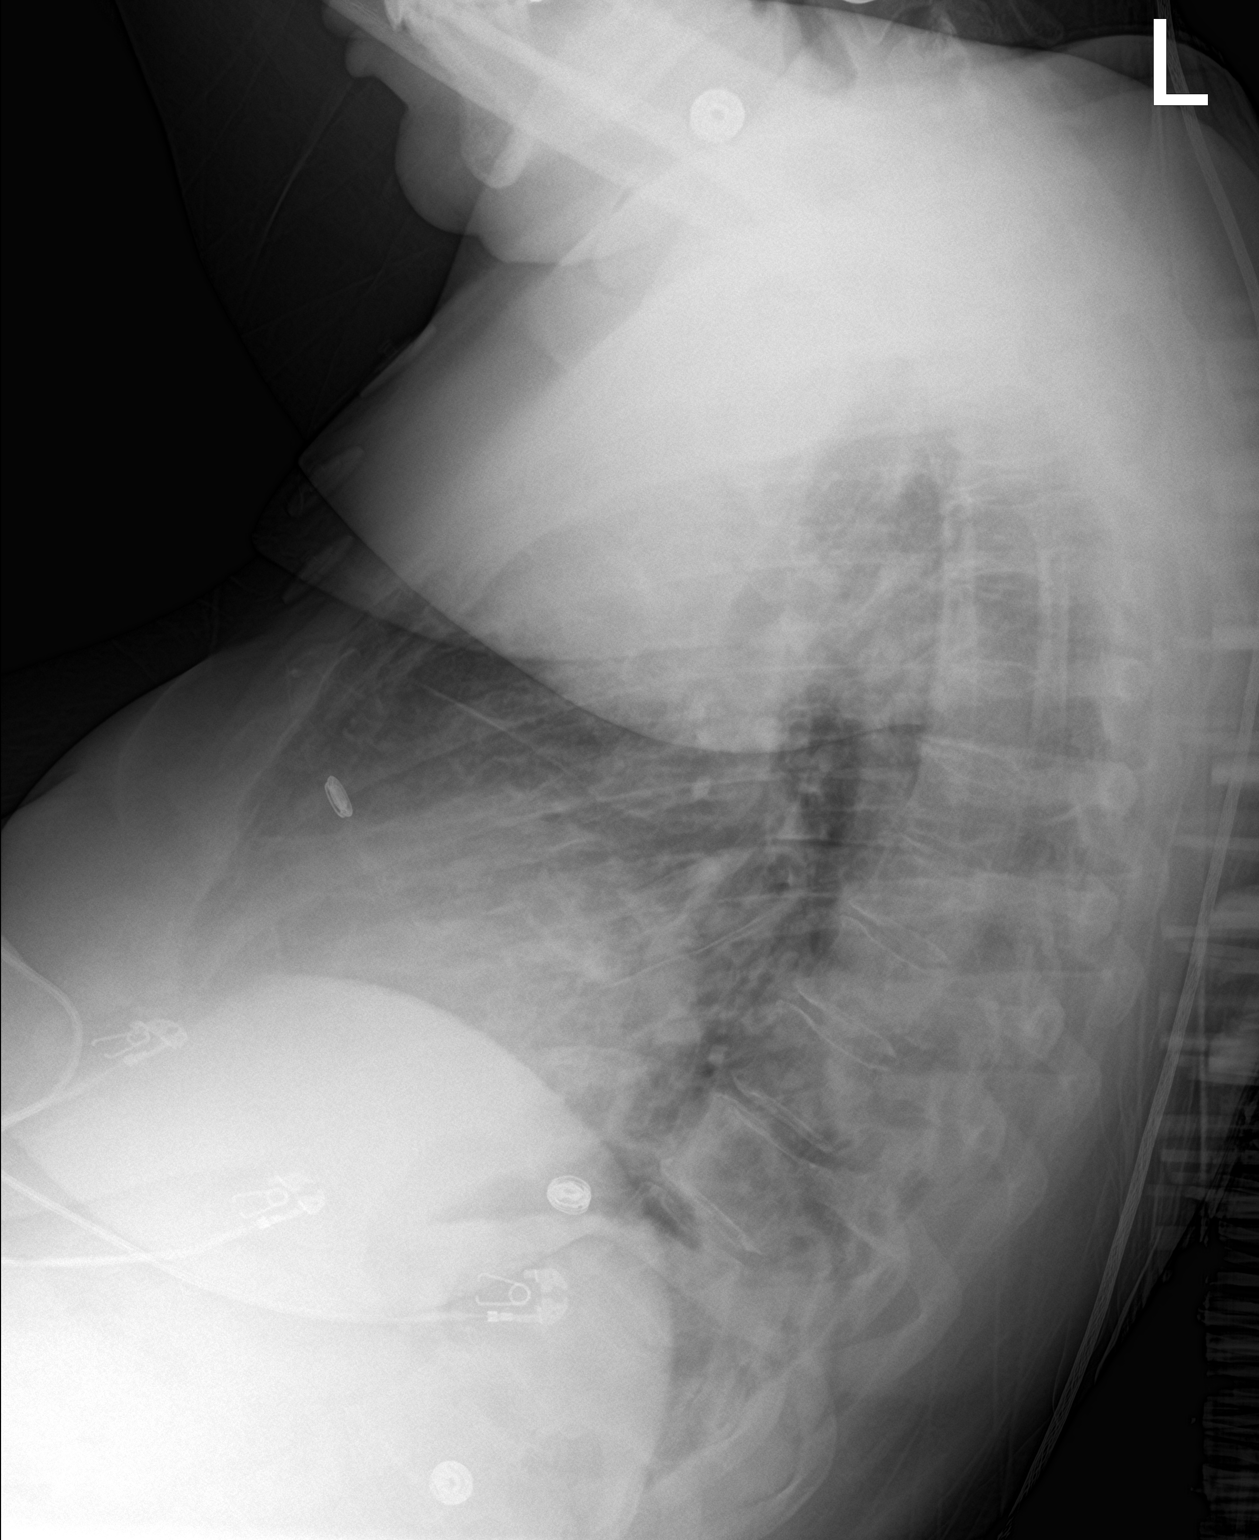

[2 of 2 positions shown; findings below may reference images not displayed]

FINDINGS: Patchy areas of consolidation with few air bronchograms are present
in right lower lobe and retrocardiac space. No pneumothorax or
effusion. Cardiomediastinal contours are normal. No acute osseous or
soft tissue abnormality.
IMPRESSION: Findings could reflect multifocal pneumonia in the appropriate
setting.

## 2021-05-10 IMAGING — CT CT CHEST WITH CONTRAST
2 of 4 series · 15 of 36 positions shown, 18 images · IV contrast (APPLIED)
Comparison: Chest x-ray from earlier in the same day.

CLINICAL DATA: Abnormal chest x-ray

EXAM:
CT CHEST WITH CONTRAST
TECHNIQUE: Multidetector CT imaging of the chest was performed during
intravenous contrast administration.
CONTRAST:  75mL OMNIPAQUE IOHEXOL 300 MG/ML  SOLN

[Series 3: chest w · axial · 0.71mm/px · z∈[+1046,+1280]mm · 12 of 139 slices shown, 15 images]
[im 11/139  mediastinal]
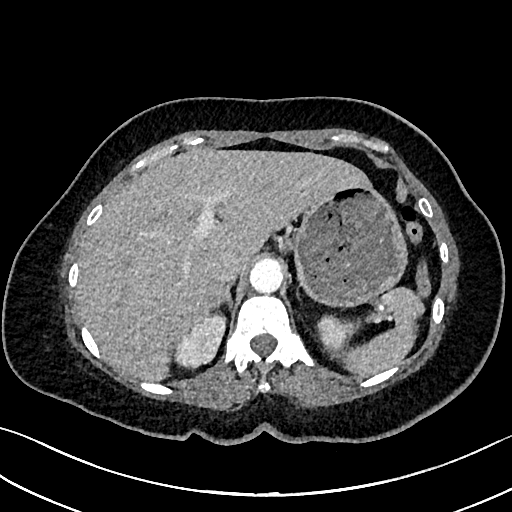
[im 11/139  lung]
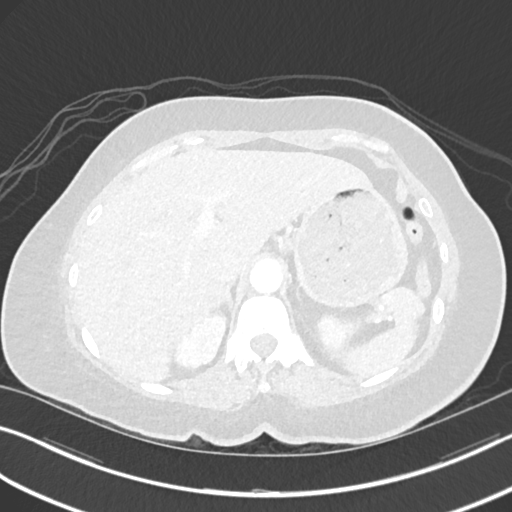
[im 22/139  lung]
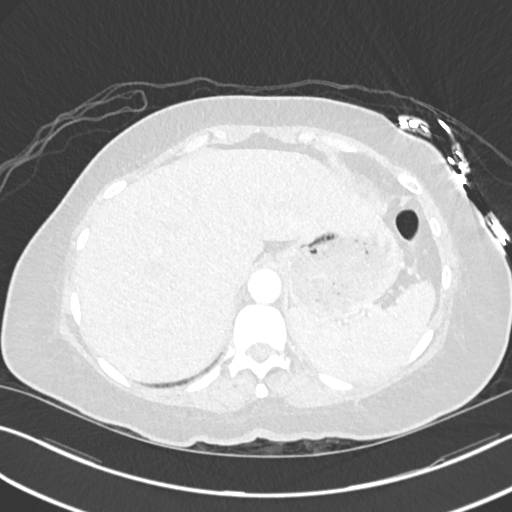
[im 32/139  lung]
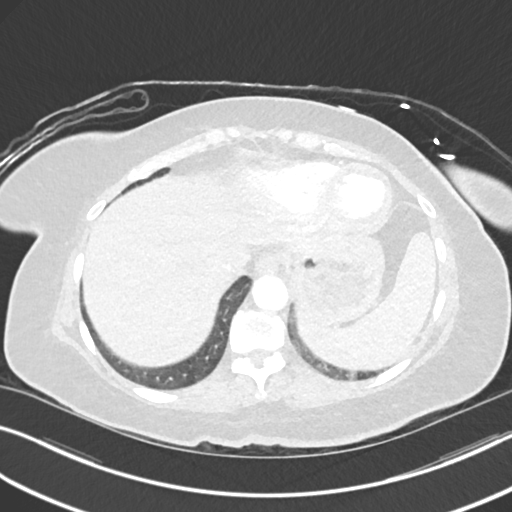
[im 43/139  lung]
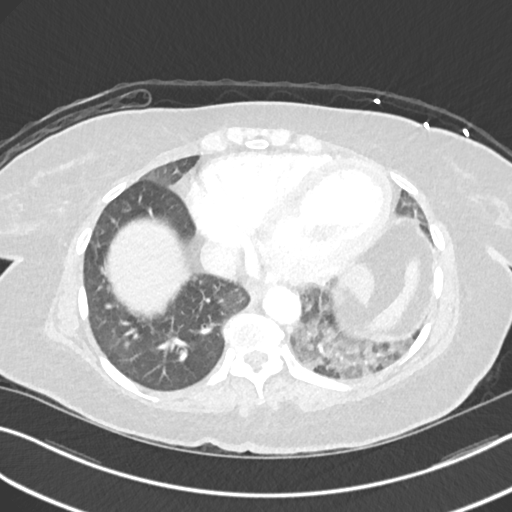
[im 54/139  mediastinal]
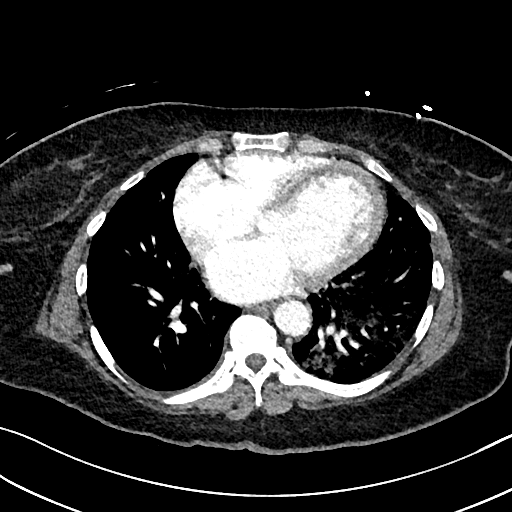
[im 54/139  lung]
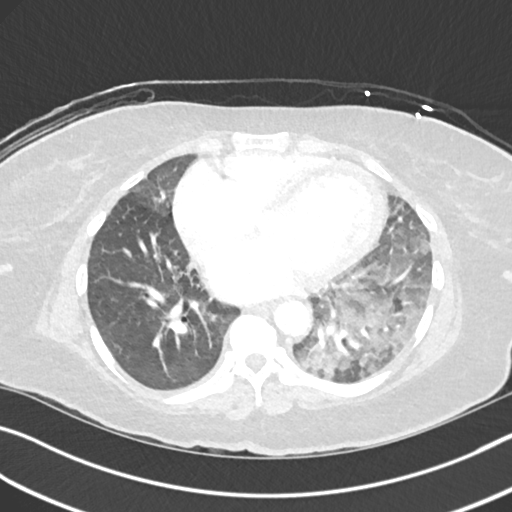
[im 64/139  lung]
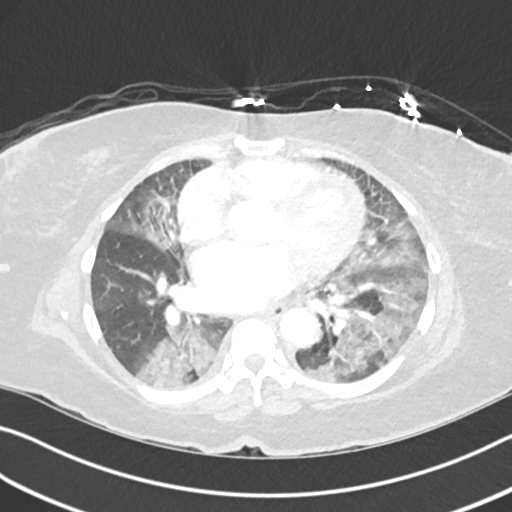
[im 75/139  lung]
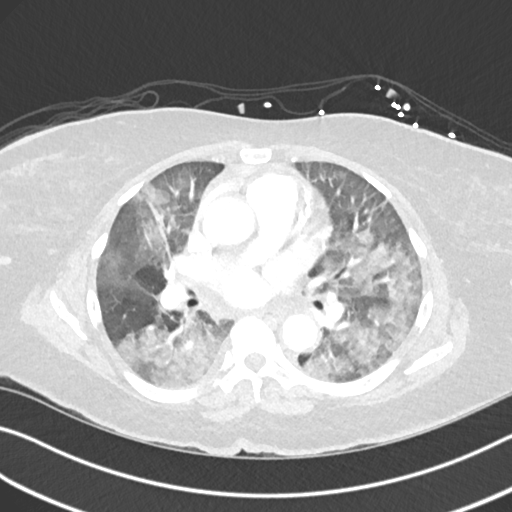
[im 85/139  lung]
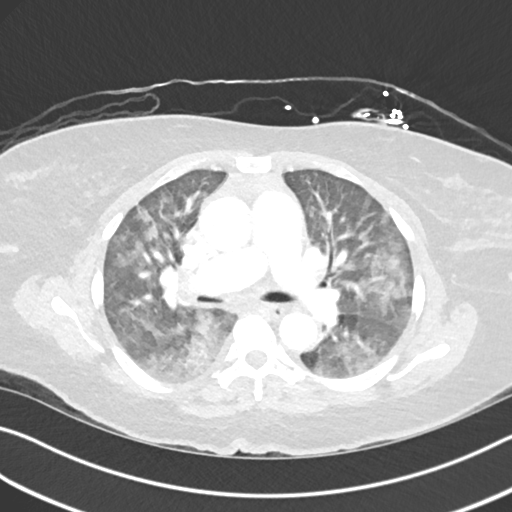
[im 96/139  mediastinal]
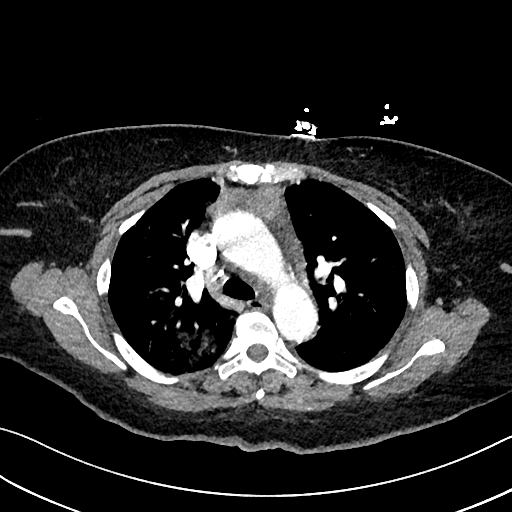
[im 96/139  lung]
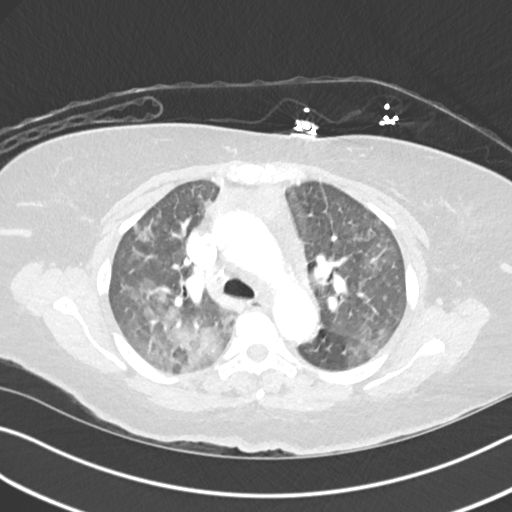
[im 107/139  lung]
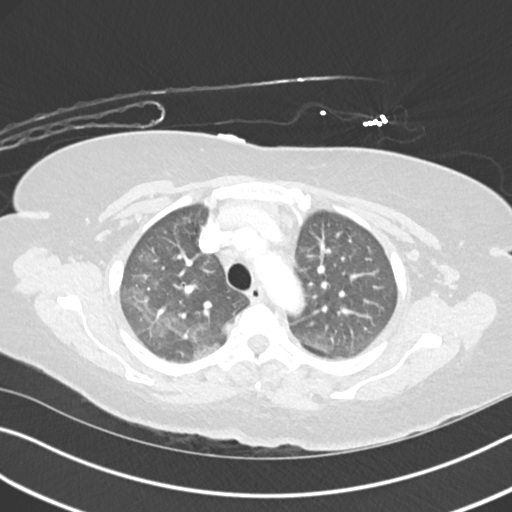
[im 117/139  lung]
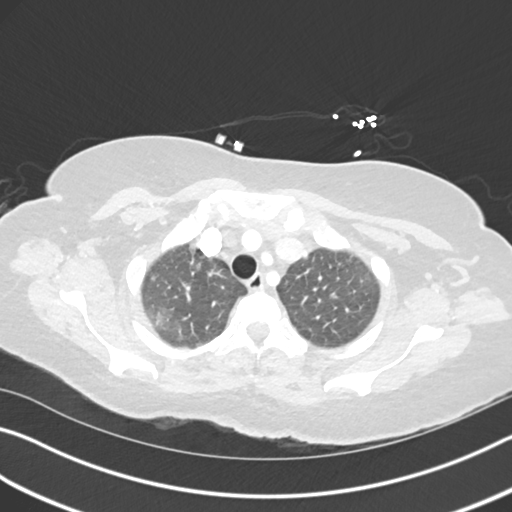
[im 128/139  lung]
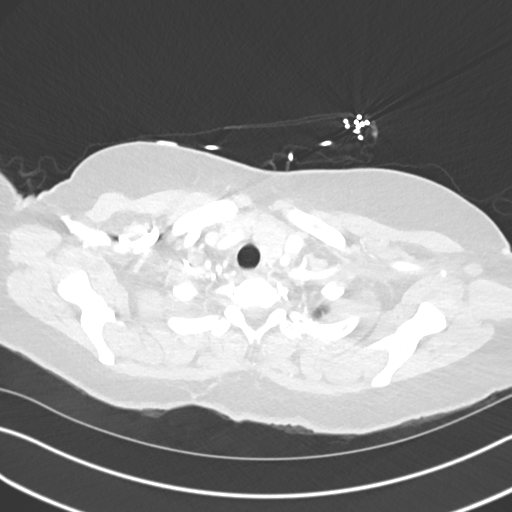

[Series 6: cor · coronal · 0.55mm/px · 3 of 127 slices shown]
[im 26/127  lung]
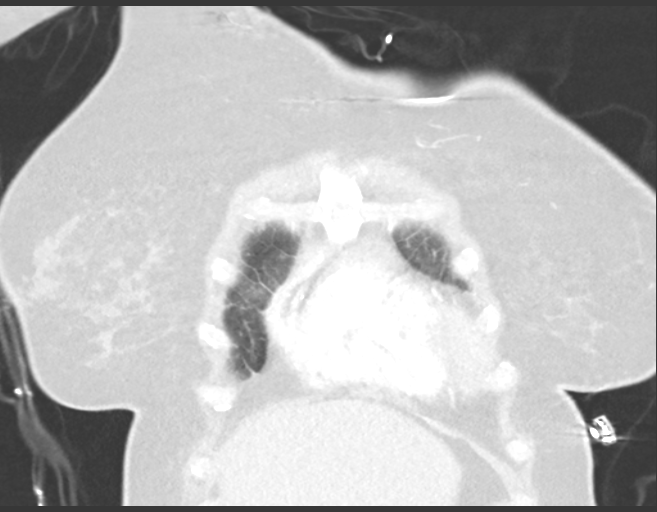
[im 51/127  lung]
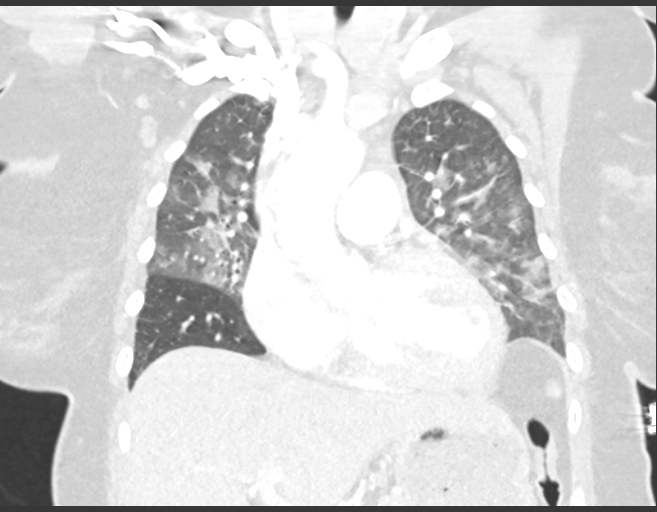
[im 76/127  lung]
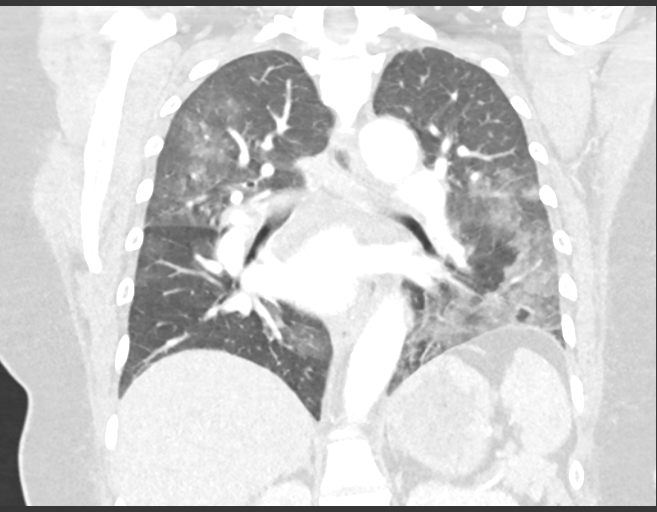

[15 of 36 positions shown; findings below may reference images not displayed]

FINDINGS: Cardiovascular: Thoracic aorta and its branches are within normal
limits. No aneurysmal dilatation or dissection is seen. Moderate
coronary calcifications are noted. No significant cardiac
enlargement is noted. No large central pulmonary embolus is seen.

Mediastinum/Nodes: Thoracic inlet is within normal limits. The
esophagus as visualized is within normal limits. No hilar or
mediastinal adenopathy is seen.

Lungs/Pleura: The lungs are well aerated bilaterally. Patchy
ground-glass infiltrates are identified bilaterally predominately
within the bases. No associated effusion or pneumothorax is seen.
Scattered calcified granulomas are noted.

Upper Abdomen: The visualized upper abdomen is within normal limits.

Musculoskeletal: No acute bony abnormality is seen.
IMPRESSION: There are a spectrum of findings in the lungs which can be seen with
acute atypical infection (as well as other non-infectious
etiologies). In particular, viral pneumonia (including [id number])
should be considered in the appropriate clinical setting.

## 2021-05-11 IMAGING — DX ABDOMEN - 1 VIEW
1 series · 1 of 1 positions shown · non-contrast
Comparison: None.

CLINICAL DATA: NG tube placement

EXAM:
ABDOMEN - 1 VIEW

[abdomen kub]
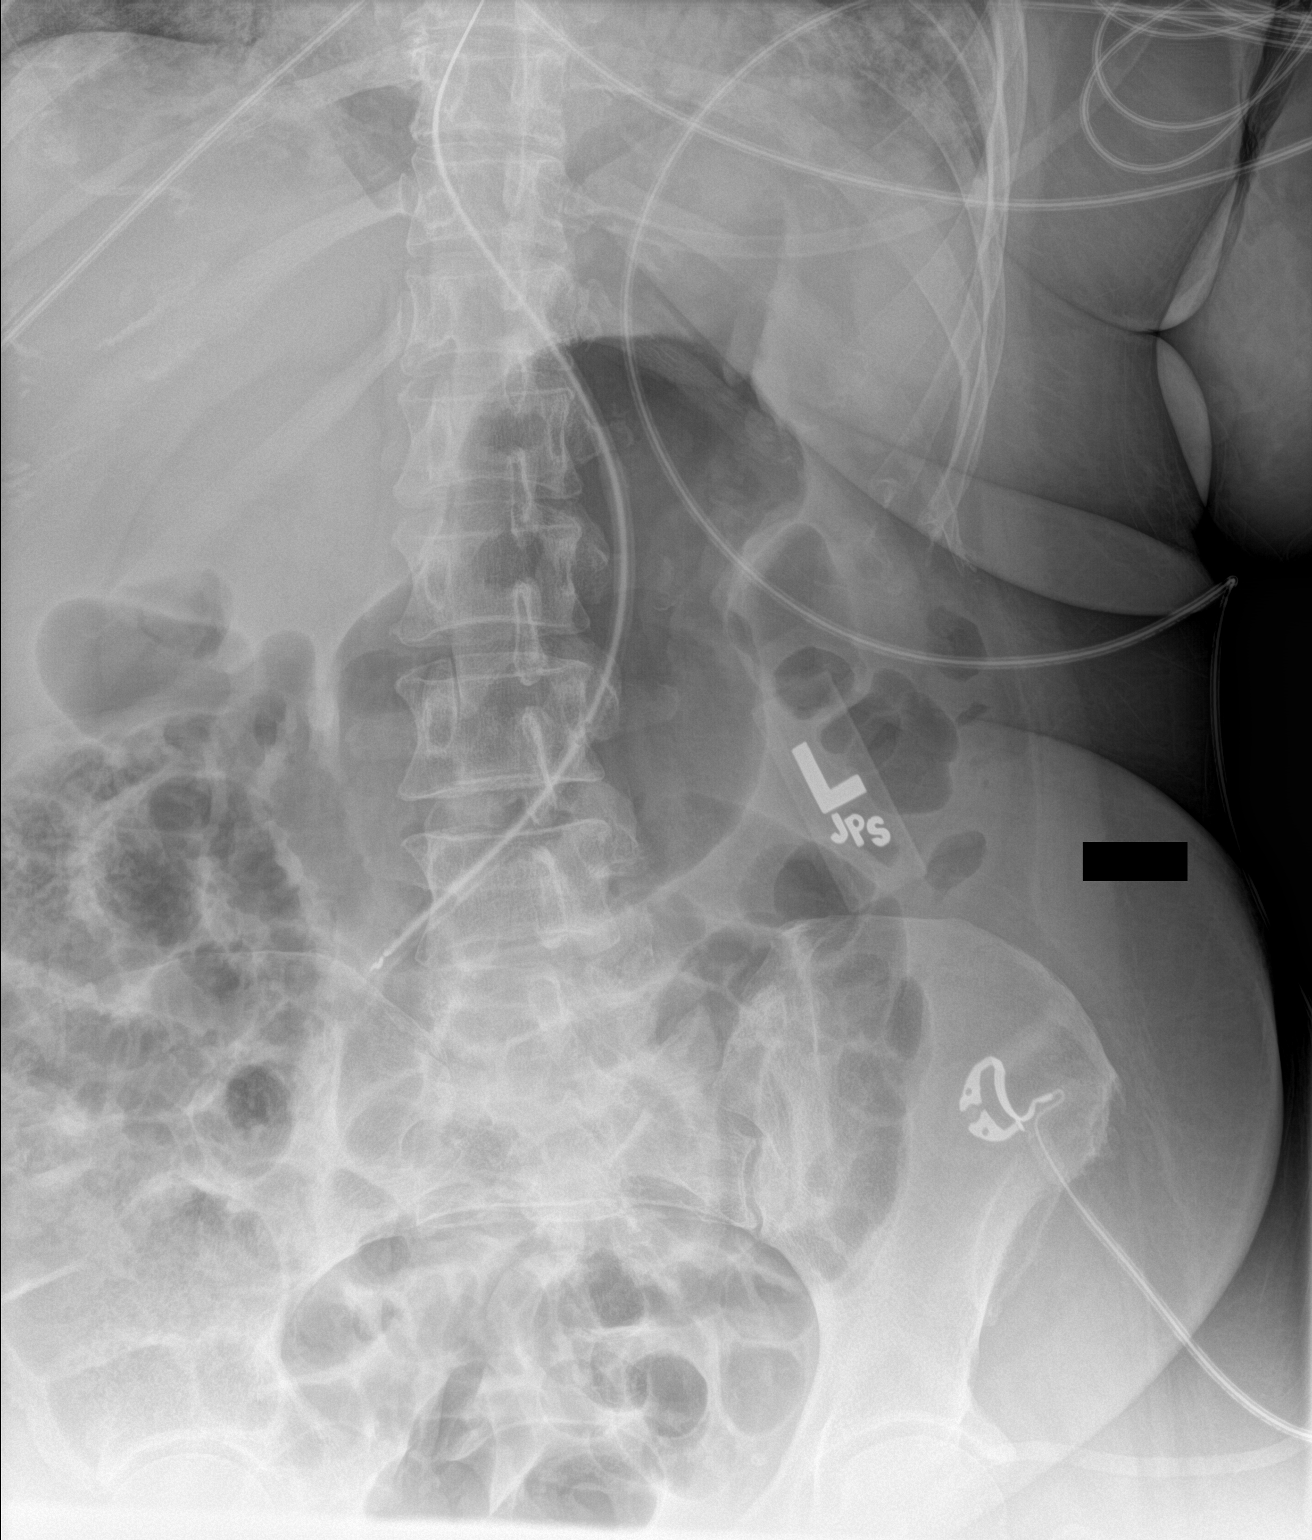

[1 of 1 positions shown; findings below may reference images not displayed]

FINDINGS: The tip of the NG tube is seen projecting over the distal stomach.
The side hole is seen projecting over the mid stomach. Air seen in
nondilated loops of bowel.
IMPRESSION: Tip of NG tube seen projecting over the stomach.

## 2021-05-12 IMAGING — DX PORTABLE CHEST - 1 VIEW
1 series · 1 of 1 positions shown · non-contrast
Comparison: 05/21/2019.

CLINICAL DATA: Intubation.  Respiratory failure.

EXAM:
PORTABLE CHEST 1 VIEW

[chest ap]
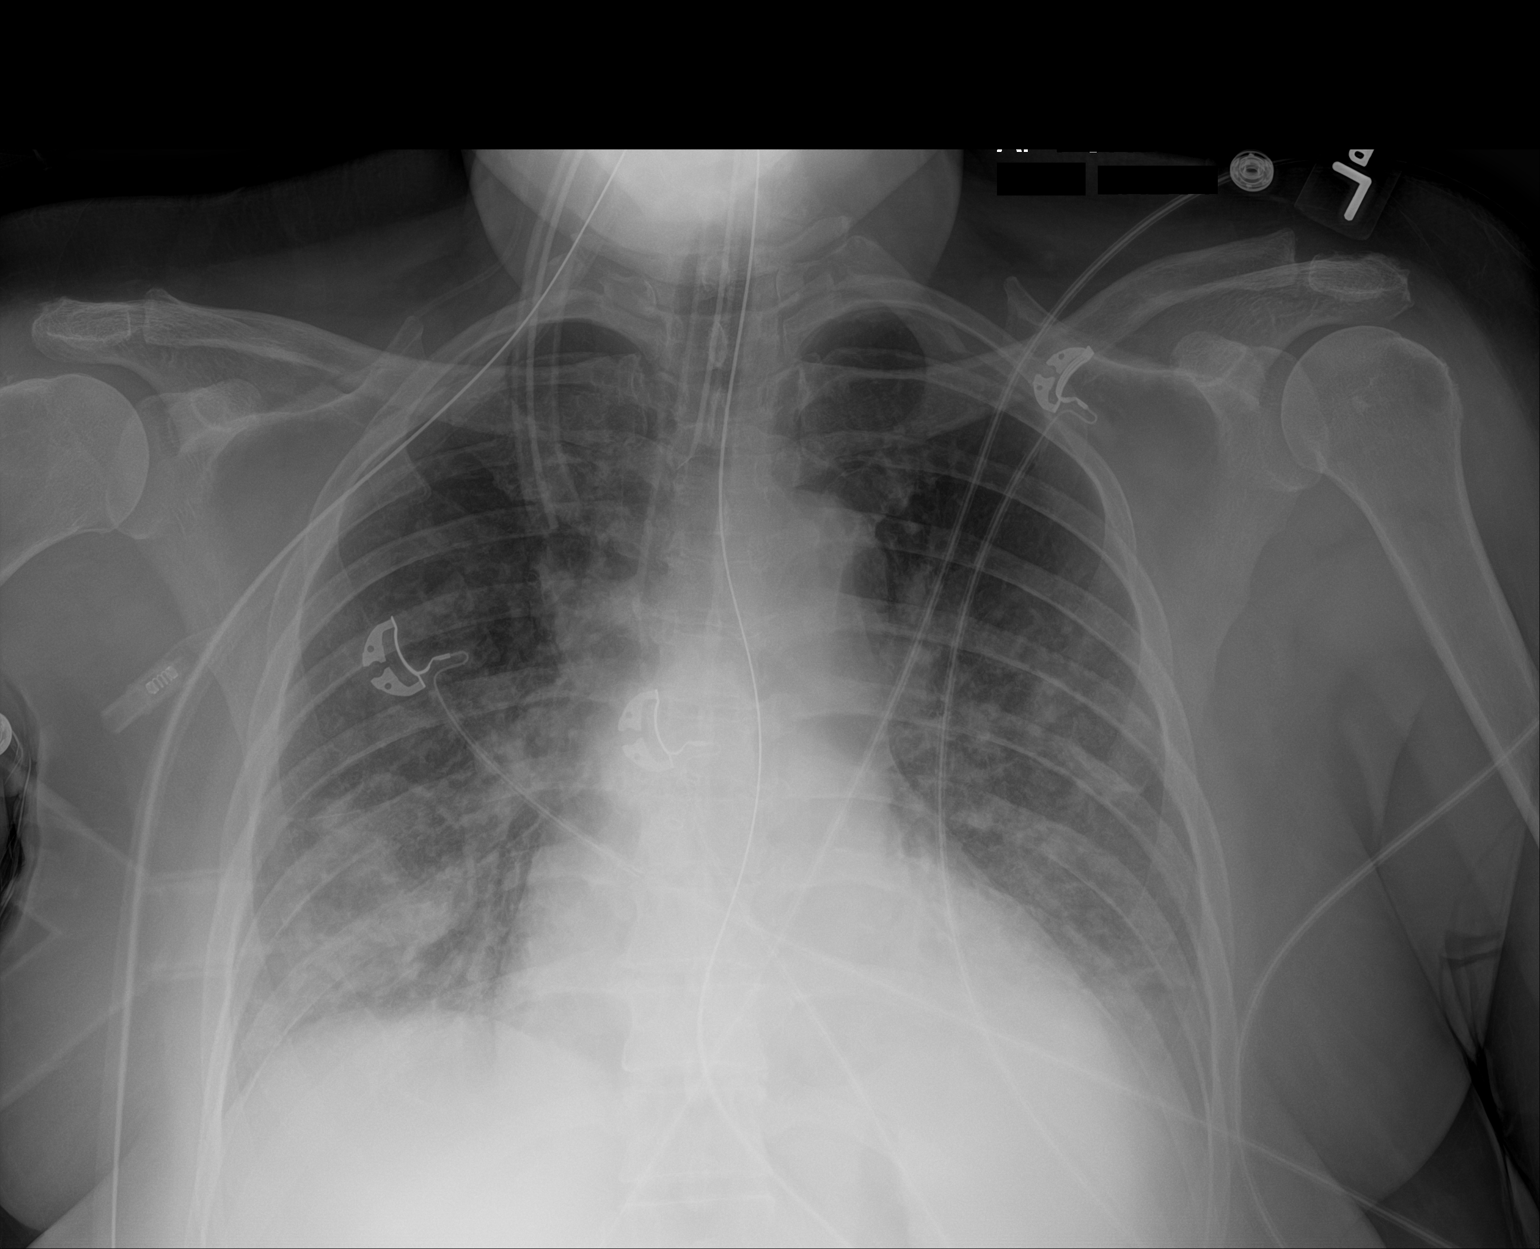

[1 of 1 positions shown; findings below may reference images not displayed]

FINDINGS: Endotracheal tube has been withdrawn and its tip is now 4 cm above
the carina in good anatomic position. NG tube noted with tip below
left hemidiaphragm. Cardiomegaly. Diffuse bilateral pulmonary
infiltrates/edema again noted, improved from prior exam. Small left
pleural effusion cannot be excluded. No pneumothorax.
IMPRESSION: 1. Endotracheal tube has been withdrawn and its tip is now 4 cm
above the carina in good anatomic position. NG tube noted with tip
below left hemidiaphragm.

2. Cardiomegaly. Diffuse bilateral pulmonary infiltrates/edema again
noted, improved from prior exam. Small left pleural effusion cannot
be excluded.

## 2021-05-16 ENCOUNTER — Other Ambulatory Visit: Payer: Self-pay | Admitting: Cardiovascular Disease

## 2021-05-19 ENCOUNTER — Other Ambulatory Visit: Payer: Self-pay

## 2021-05-19 ENCOUNTER — Ambulatory Visit (INDEPENDENT_AMBULATORY_CARE_PROVIDER_SITE_OTHER): Payer: Commercial Managed Care - PPO | Admitting: Primary Care

## 2021-05-19 ENCOUNTER — Encounter: Payer: Self-pay | Admitting: Primary Care

## 2021-05-19 ENCOUNTER — Ambulatory Visit
Admission: RE | Admit: 2021-05-19 | Discharge: 2021-05-19 | Disposition: A | Discharge status: Home / Self Care | Payer: Commercial Managed Care - PPO | Source: Ambulatory Visit | Attending: Primary Care | Admitting: Primary Care

## 2021-05-19 ENCOUNTER — Ambulatory Visit
Admission: RE | Admit: 2021-05-19 | Discharge: 2021-05-19 | Disposition: A | Discharge status: Home / Self Care | Payer: Commercial Managed Care - PPO | Attending: Internal Medicine | Admitting: Internal Medicine

## 2021-05-19 VITALS — BP 144/92 | HR 66 | Temp 97.6°F | Ht 64.0 in | Wt 242.0 lb

## 2021-05-19 DIAGNOSIS — Z23 Encounter for immunization: Secondary | ICD-10-CM

## 2021-05-19 DIAGNOSIS — E785 Hyperlipidemia, unspecified: Secondary | ICD-10-CM

## 2021-05-19 DIAGNOSIS — R053 Chronic cough: Secondary | ICD-10-CM | POA: Insufficient documentation

## 2021-05-19 DIAGNOSIS — R0683 Snoring: Secondary | ICD-10-CM | POA: Diagnosis not present

## 2021-05-19 DIAGNOSIS — E0590 Thyrotoxicosis, unspecified without thyrotoxic crisis or storm: Secondary | ICD-10-CM

## 2021-05-19 DIAGNOSIS — I2510 Atherosclerotic heart disease of native coronary artery without angina pectoris: Secondary | ICD-10-CM

## 2021-05-19 DIAGNOSIS — I509 Heart failure, unspecified: Secondary | ICD-10-CM

## 2021-05-19 DIAGNOSIS — Z0001 Encounter for general adult medical examination with abnormal findings: Secondary | ICD-10-CM

## 2021-05-19 DIAGNOSIS — I10 Essential (primary) hypertension: Secondary | ICD-10-CM

## 2021-05-19 DIAGNOSIS — R7303 Prediabetes: Secondary | ICD-10-CM

## 2021-05-19 DIAGNOSIS — Z1159 Encounter for screening for other viral diseases: Secondary | ICD-10-CM | POA: Diagnosis not present

## 2021-05-19 DIAGNOSIS — Z1211 Encounter for screening for malignant neoplasm of colon: Secondary | ICD-10-CM | POA: Diagnosis not present

## 2021-05-19 HISTORY — DX: Chronic cough: R05.3

## 2021-05-19 LAB — COMPREHENSIVE METABOLIC PANEL
ALT: 11 U/L (ref 0–35)
AST: 13 U/L (ref 0–37)
Albumin: 4.1 g/dL (ref 3.5–5.2)
Alkaline Phosphatase: 71 U/L (ref 39–117)
BUN: 13 mg/dL (ref 6–23)
CO2: 27 mEq/L (ref 19–32)
Calcium: 9.3 mg/dL (ref 8.4–10.5)
Chloride: 104 mEq/L (ref 96–112)
Creatinine, Ser: 0.75 mg/dL (ref 0.40–1.20)
GFR: 92.51 mL/min (ref >60.00)
Glucose, Bld: 120 mg/dL — ABNORMAL HIGH (ref 70–99)
Potassium: 3.8 mEq/L (ref 3.5–5.1)
Sodium: 141 mEq/L (ref 135–145)
Total Bilirubin: 0.3 mg/dL (ref 0.2–1.2)
Total Protein: 7.4 g/dL (ref 6.0–8.3)

## 2021-05-19 LAB — LIPID PANEL
Cholesterol: 161 mg/dL (ref 0–200)
HDL: 38.5 mg/dL — ABNORMAL LOW (ref >39.00)
LDL Cholesterol: 100 mg/dL — ABNORMAL HIGH (ref 0–99)
NonHDL: 122.03
Total CHOL/HDL Ratio: 4
Triglycerides: 110 mg/dL (ref 0.0–149.0)
VLDL: 22 mg/dL (ref 0.0–40.0)

## 2021-05-19 LAB — HEMOGLOBIN A1C: Hgb A1c MFr Bld: 7 % — ABNORMAL HIGH (ref 4.6–6.5)

## 2021-05-19 NOTE — Assessment & Plan Note (Signed)
First shingrix due, provided today. Other vaccines UTD. Pap smear UTD per patient, sees GYN in the [place]. She will get records.  Mammogram scheduled. Colonoscopy due, referral placed.  Discussed the importance of a healthy diet and regular exercise in order for weight loss, and to reduce the risk of further co-morbidity.  Exam today as noted Labs pending.

## 2021-05-19 NOTE — Assessment & Plan Note (Signed)
Compliant to atorvastatin 80 mg and Zetia 10 mg, repeat lipid panel pending.

## 2021-05-19 NOTE — Assessment & Plan Note (Signed)
Appears euvolemic today. Continue metoprolol succinate 25 mg, spironolactone 12.5 mg, losartan 50 mg, furosemide 20 mg, Imdur 15 mg daily.

## 2021-05-19 NOTE — Assessment & Plan Note (Signed)
Chronic, also with what sounds to be periods of apnea. Referral placed to pulmonology for sleep study

## 2021-05-19 NOTE — Assessment & Plan Note (Signed)
Above goal today, is not checking readings at home.  Discussed to start monitoring BP and to report if readings are consistently at or above 130/90.  Continue losartan 50 mg, spironolactone 12.5 mg daily. CMP pending.

## 2021-05-19 NOTE — Progress Notes (Signed)
Subjective:    Patient ID: Amber [name], female    DOB: [birth year], 50 y.o.   MRN: [id number]  HPI  Amber [name] is a very pleasant 50 y.o. female who presents today for complete physical and follow up of chronic conditions.  She would also like to discuss acute cough, will experience coughing spells. Symptoms began about one month ago. She has tested negative for Covid-19. She is managed on losartan, has been taking for over one year. She denies post nasal drip, esophageal burning. Cough is worse a night. History of CHF, denies increased lower extremity edema.   She would also like to discuss waking during the night gasping for air. She does snore. Chronic symptoms for years. Her family can hear her from other bedrooms. She does have daytime fatigue. She's never undergone a sleep study.   Immunizations: -Tetanus: 2016 -Influenza: Due this season  -Covid-19: 2 vaccines -Shingles: Never completed -Pneumonia: 2020  Diet: Fair diet.  Exercise: No regular exercise.  Eye exam: No recent exam Dental exam: No recent exam   Pap Smear: Patient endorses UTD, had done in [facility] Mammogram: Completed in May 2021, scheduled for August 2022 Colonoscopy: Never completed   BP Readings from Last 3 Encounters:  05/19/21 (!) 144/92  02/24/21 134/88  01/05/21 118/60      Review of Systems  Constitutional:  Negative for unexpected weight change.  HENT:  Negative for rhinorrhea.   Respiratory:  Positive for cough. Negative for shortness of breath.   Cardiovascular:  Negative for chest pain.  Gastrointestinal:  Negative for constipation and diarrhea.  Genitourinary:  Negative for difficulty urinating.  Musculoskeletal:  Negative for arthralgias and myalgias.  Skin:  Negative for rash.  Allergic/Immunologic: Negative for environmental allergies.  Neurological:  Negative for dizziness and headaches.  Psychiatric/Behavioral:  The patient is not nervous/anxious.         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   Married.   2 children.   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   Social Determinants of Health   Financial Resource Strain: Not on file  Food Insecurity: Not on file  Transportation Needs: Not on file  Physical Activity: Not on file  Stress: Not on file  Social Connections: Not on file  Intimate Partner Violence: Not on file    Past Surgical History:  Procedure Laterality Date   CARDIAC CATHETERIZATION     CESAREAN  SECTION     x2   CORONARY STENT INTERVENTION N/A 05/22/2019   Procedure: CORONARY STENT INTERVENTION;  Surgeon: Iran Ouch, MD;  Location: [facility];  Service: Cardiovascular;  Laterality: N/A;   MASS EXCISION Right 01/18/2013   Procedure: EXCISION right scapular cyst;  Surgeon: Axel Filler, MD;  Location: [facility];  Service: General;  Laterality: Right;   RIGHT/LEFT HEART CATH AND CORONARY ANGIOGRAPHY N/A 05/22/2019   Procedure: RIGHT/LEFT HEART CATH AND CORONARY ANGIOGRAPHY;  Surgeon: Iran Ouch, MD;  Location: [facility];  Service:  Cardiovascular;  Laterality: N/A;    Family History  Problem Relation Age of Onset   Diabetes Father    Diabetes Mother    Heart disease Mother    Diabetes Sister    Hypertension Sister     No Known Allergies  Current Outpatient Medications on File Prior to Visit  Medication Sig Dispense Refill   aspirin 81 MG chewable tablet Chew 1 tablet (81 mg total) by mouth daily. 30 tablet 11   atorvastatin (LIPITOR) 80 MG tablet TAKE 1 TABLET BY MOUTH DAILY AT 6 PM. 30 tablet 6   calcium-vitamin D 250-100 MG-UNIT tablet Take 1 tablet by mouth 2 (two) times daily.     ezetimibe (ZETIA) 10 MG tablet Take 1 tablet (10 mg total) by mouth daily. 90 tablet 2   ferrous sulfate 325 (65 FE) MG tablet Take 325 mg by mouth every morning.     furosemide (LASIX) 20 MG tablet TAKE 1 TABLET BY MOUTH EVERY DAY 30 tablet 4   isosorbide mononitrate (IMDUR) 30 MG 24 hr tablet TAKE 1/2 TABLET BY MOUTH DAILY 45 tablet 2   losartan (COZAAR) 50 MG tablet Take 1 tablet (50 mg total) by mouth daily. PLEASE CALL OFFICE TO SCHEDULE FOLLOW UP APPOINTMENT. 30 tablet 1   methimazole (TAPAZOLE) 5 MG tablet Take 1 tablet (5 mg total) by mouth daily. 90 tablet 3   metoprolol succinate (TOPROL XL) 25 MG 24 hr tablet Take 1 tablet (25 mg total) by mouth daily. 90 tablet 3   nitroGLYCERIN (NITROSTAT) 0.4 MG SL tablet Place 1 tablet (0.4 mg total) under the tongue every 5 (five) minutes as needed for chest pain. 25 tablet 1   potassium chloride SA (K-DUR) 20 MEQ tablet Take 1 tablet (20 mEq total) by mouth daily. 90 tablet 3   spironolactone (ALDACTONE) 25 MG tablet TAKE 1/2 TABLET BY MOUTH EVERY DAY 15 tablet 6   No current facility-administered medications on file prior to visit.    BP (!) 144/92   Pulse 66   Temp 97.6 F (36.4 C) (Temporal)   Ht 5\' 4"  (1.626 m)   Wt 242 lb (109.8 kg)   LMP 06/19/2017 (Approximate) Comment: over 2 years ago  SpO2 97%   BMI 41.54 kg/m  Objective:   Physical Exam HENT:     Right Ear:  Tympanic membrane and ear canal normal.     Left Ear: Tympanic membrane and ear canal normal.     Nose: Nose normal.  Eyes:     Conjunctiva/sclera: Conjunctivae normal.     Pupils: Pupils are equal, round, and reactive to light.  Neck:     Thyroid: No thyromegaly.  Cardiovascular:     Rate and Rhythm: Normal rate and regular rhythm.     Heart sounds: No murmur heard. Pulmonary:     Effort: Pulmonary effort is normal.     Breath sounds: Normal breath sounds. No rales.  Abdominal:     General: Bowel sounds are normal.     Palpations: Abdomen is soft.     Tenderness: There is no abdominal tenderness.  Musculoskeletal:        General: Normal range of motion.     Cervical back: Neck supple.  Lymphadenopathy:     Cervical: No cervical adenopathy.  Skin:    General: Skin is warm and dry.     Findings: No rash.  Neurological:     Mental Status: She is alert and oriented to person, place, and time.     Cranial Nerves: No cranial nerve deficit.     Deep Tendon Reflexes: Reflexes are normal and symmetric.  Psychiatric:        Mood and Affect: Mood normal.          Assessment & Plan:      This visit occurred during the SARS-CoV-2 public health emergency.  Safety protocols were in place, including screening questions prior to the visit, additional usage of staff PPE, and extensive cleaning of exam room while observing appropriate contact time as indicated for disinfecting solutions.

## 2021-05-19 NOTE — Assessment & Plan Note (Signed)
Dry cough x 4 weeks. Lungs clear on exam.  Discussed differentials which include allergies, GERD, CHF.   Checking chest xray today. Discussed use of antihistamine vs famotidine. She will update.

## 2021-05-19 NOTE — Assessment & Plan Note (Signed)
Chronic fatigue which could be secondary to undiagnosed sleep apnea. Otherwise asymptomatic.  No longer on clopidogrel due to epistaxis. Continue metoprolol succinate 25 mg, losartan 50 mg, spironolactone 12.5 mg, lipid control.

## 2021-05-19 NOTE — Patient Instructions (Signed)
Go to the outpatient imaging center at [facility] for your xray.   Stop by the lab prior to leaving today. I will notify you of your results once received.   You will be contacted regarding your referral to GI for the colonoscopy and to pulmonology for the sleep study.  Please let us know if you have not been contacted within two weeks.   Schedule a nurse visit to return in 2-6 months for your second shingles vaccine.  It was a pleasure to see you today!  Preventive Care 51-23 Years Old, Female Preventive care refers to lifestyle choices and visits with your health care provider that can promote health and wellness. This includes: A yearly physical exam. This is also called an annual wellness visit. Regular dental and eye exams. Immunizations. Screening for certain conditions. Healthy lifestyle choices, such as: Eating a healthy diet. Getting regular exercise. Not using drugs or products that contain nicotine and tobacco. Limiting alcohol use. What can I expect for my preventive care visit? Physical exam Your health care provider will check your: Height and weight. These may be used to calculate your BMI (body mass index). BMI is a measurement that tells if you are at a healthy weight. Heart rate and blood pressure. Body temperature. Skin for abnormal spots. Counseling Your health care provider may ask you questions about your: Past medical problems. Family's medical history. Alcohol, tobacco, and drug use. Emotional well-being. Home life and relationship well-being. Sexual activity. Diet, exercise, and sleep habits. Work and work Statistician. Access to firearms. Method of birth control. Menstrual cycle. Pregnancy history. What immunizations do I need?  Vaccines are usually given at various ages, according to a schedule. Your health care provider will recommend vaccines for you based on your age, medicalhistory, and lifestyle or other factors, such as  travel or where you work. What tests do I need? Blood tests Lipid and cholesterol levels. These may be checked every 5 years, or more often if you are over 38 years old. Hepatitis C test. Hepatitis B test. Screening Lung cancer screening. You may have this screening every year starting at age 51 if you have a 30-pack-year history of smoking and currently smoke or have quit within the past 15 years. Colorectal cancer screening. All adults should have this screening starting at age 22 and continuing until age 51. Your health care provider may recommend screening at age 19 if you are at increased risk. You will have tests every 1-10 years, depending on your results and the type of screening test. Diabetes screening. This is done by checking your blood sugar (glucose) after you have not eaten for a while (fasting). You may have this done every 1-3 years. Mammogram. This may be done every 1-2 years. Talk with your health care provider about when you should start having regular mammograms. This may depend on whether you have a family history of breast cancer. BRCA-related cancer screening. This may be done if you have a family history of breast, ovarian, tubal, or peritoneal cancers. Pelvic exam and Pap test. This may be done every 3 years starting at age 51. Starting at age 10, this may be done every 5 years if you have a Pap test in combination with an HPV test. Other tests STD (sexually transmitted disease) testing, if you are at risk. Bone density scan. This is done to screen for osteoporosis. You may have this scan if you are at high risk for osteoporosis. Talk with your health care provider  about your test results, treatment options,and if necessary, the need for more tests. Follow these instructions at home: Eating and drinking  Eat a diet that includes fresh fruits and vegetables, whole grains, lean protein, and low-fat dairy products. Take vitamin and mineral supplements as  recommended by your health care provider. Do not drink alcohol if: Your health care provider tells you not to drink. You are pregnant, may be pregnant, or are planning to become pregnant. If you drink alcohol: Limit how much you have to 0-1 drink a day. Be aware of how much alcohol is in your drink. In the [place] one drink equals one 12 oz bottle of beer (355 mL), one 5 oz glass of wine (148 mL), or one 1 oz glass of hard liquor (44 mL).  Lifestyle Take daily care of your teeth and gums. Brush your teeth every morning and night with fluoride toothpaste. Floss one time each day. Stay active. Exercise for at least 30 minutes 5 or more days each week. Do not use any products that contain nicotine or tobacco, such as cigarettes, e-cigarettes, and chewing tobacco. If you need help quitting, ask your health care provider. Do not use drugs. If you are sexually active, practice safe sex. Use a condom or other form of protection to prevent STIs (sexually transmitted infections). If you do not wish to become pregnant, use a form of birth control. If you plan to become pregnant, see your health care provider for a prepregnancy visit. If told by your health care provider, take low-dose aspirin daily starting at age 56. Find healthy ways to cope with stress, such as: Meditation, yoga, or listening to music. Journaling. Talking to a trusted person. Spending time with friends and family. Safety Always wear your seat belt while driving or riding in a vehicle. Do not drive: If you have been drinking alcohol. Do not ride with someone who has been drinking. When you are tired or distracted. While texting. Wear a helmet and other protective equipment during sports activities. If you have firearms in your house, make sure you follow all gun safety procedures. What's next? Visit your health care provider once a year for an annual wellness visit. Ask your health care provider how often you should have your  eyes and teeth checked. Stay up to date on all vaccines. This information is not intended to replace advice given to you by your health care provider. Make sure you discuss any questions you have with your healthcare provider. Document Revised: 07/07/2020 Document Reviewed: 06/14/2018 Elsevier Patient Education  2022 [employer].

## 2021-05-19 NOTE — Assessment & Plan Note (Signed)
Repeat A1C pending.  Discussed the importance of a healthy diet and regular exercise in order for weight loss, and to reduce the risk of further co-morbidity.  

## 2021-05-19 NOTE — Assessment & Plan Note (Signed)
Following with endocrinology, last office note reviewed, managed on methimazole 5 mg, continue same.

## 2021-05-20 ENCOUNTER — Other Ambulatory Visit (INDEPENDENT_AMBULATORY_CARE_PROVIDER_SITE_OTHER): Payer: Commercial Managed Care - PPO

## 2021-05-20 ENCOUNTER — Other Ambulatory Visit: Payer: Self-pay | Admitting: Primary Care

## 2021-05-20 DIAGNOSIS — R918 Other nonspecific abnormal finding of lung field: Secondary | ICD-10-CM

## 2021-05-20 DIAGNOSIS — R053 Chronic cough: Secondary | ICD-10-CM

## 2021-05-20 DIAGNOSIS — R059 Cough, unspecified: Secondary | ICD-10-CM | POA: Diagnosis not present

## 2021-05-20 LAB — CBC WITH DIFFERENTIAL/PLATELET
Basophils Absolute: 0.1 10*3/uL (ref 0.0–0.1)
Basophils Relative: 1.1 % (ref 0.0–3.0)
Eosinophils Absolute: 0.2 10*3/uL (ref 0.0–0.7)
Eosinophils Relative: 1.3 % (ref 0.0–5.0)
HCT: 36.3 % (ref 36.0–46.0)
Hemoglobin: 12.3 g/dL (ref 12.0–15.0)
Lymphocytes Relative: 35.7 % (ref 12.0–46.0)
Lymphs Abs: 4.4 10*3/uL — ABNORMAL HIGH (ref 0.7–4.0)
MCHC: 33.8 g/dL (ref 30.0–36.0)
MCV: 78.8 fl (ref 78.0–100.0)
Monocytes Absolute: 0.8 10*3/uL (ref 0.1–1.0)
Monocytes Relative: 6.9 % (ref 3.0–12.0)
Neutro Abs: 6.7 10*3/uL (ref 1.4–7.7)
Neutrophils Relative %: 55 % (ref 43.0–77.0)
Platelets: 426 10*3/uL — ABNORMAL HIGH (ref 150.0–400.0)
RBC: 4.6 Mil/uL (ref 3.87–5.11)
RDW: 15 % (ref 11.5–15.5)
WBC: 12.2 10*3/uL — ABNORMAL HIGH (ref 4.0–10.5)

## 2021-05-20 LAB — HEPATITIS C ANTIBODY
Hepatitis C Ab: NONREACTIVE
SIGNAL TO CUT-OFF: 0.04 (ref <1.00)

## 2021-05-20 MED ORDER — AMOXICILLIN-POT CLAVULANATE 875-125 MG PO TABS: 1.0000 | ORAL_TABLET | Freq: Two times a day (BID) | ORAL | 0 refills | Status: DC

## 2021-05-22 ENCOUNTER — Other Ambulatory Visit: Payer: Self-pay | Admitting: Primary Care

## 2021-05-22 DIAGNOSIS — R918 Other nonspecific abnormal finding of lung field: Secondary | ICD-10-CM

## 2021-05-22 DIAGNOSIS — R053 Chronic cough: Secondary | ICD-10-CM

## 2021-05-24 ENCOUNTER — Other Ambulatory Visit: Payer: Self-pay

## 2021-05-24 MED ORDER — NA SULFATE-K SULFATE-MG SULF 17.5-3.13-1.6 GM/177ML PO SOLN: 1.0000 | ORAL | 0 refills | Status: DC

## 2021-05-26 ENCOUNTER — Other Ambulatory Visit: Payer: Self-pay

## 2021-05-26 MED ORDER — METFORMIN HCL ER 500 MG PO TB24: 500.0000 mg | ORAL_TABLET | Freq: Every day | ORAL | 1 refills | Status: DC

## 2021-05-27 ENCOUNTER — Other Ambulatory Visit: Payer: Self-pay

## 2021-05-27 MED ORDER — PEG 3350-KCL-NA BICARB-NACL 420 G PO SOLR: ORAL | 0 refills | Status: DC

## 2021-05-31 ENCOUNTER — Ambulatory Visit
Admission: RE | Admit: 2021-05-31 | Discharge: 2021-05-31 | Disposition: A | Discharge status: Home / Self Care | Payer: Commercial Managed Care - PPO | Source: Ambulatory Visit | Attending: Internal Medicine | Admitting: Internal Medicine

## 2021-05-31 ENCOUNTER — Other Ambulatory Visit: Payer: Self-pay

## 2021-05-31 DIAGNOSIS — Z1231 Encounter for screening mammogram for malignant neoplasm of breast: Secondary | ICD-10-CM

## 2021-06-01 NOTE — Addendum Note (Signed)
Addended by: Donnamarie Poag on: 06/01/2021 03:51 PM   Modules accepted: Orders

## 2021-06-10 ENCOUNTER — Other Ambulatory Visit: Payer: Self-pay | Admitting: Cardiovascular Disease

## 2021-06-14 ENCOUNTER — Other Ambulatory Visit: Payer: Self-pay | Admitting: Cardiovascular Disease

## 2021-06-15 ENCOUNTER — Telehealth: Payer: Self-pay | Admitting: Primary Care

## 2021-06-15 NOTE — Telephone Encounter (Signed)
Pt wanted to know if she needed follow up blood work before her next appt in Nov

## 2021-06-15 NOTE — Telephone Encounter (Signed)
Left message to return call to our office.  

## 2021-06-22 NOTE — Telephone Encounter (Signed)
Called x 3 l/m to call office. Not able to send my chart message ok to mail letter that we will do labs day of?

## 2021-06-22 NOTE — Telephone Encounter (Signed)
Yes, you may also want to mention our move to [place] incase she doesn't receive the other letter.

## 2021-06-23 ENCOUNTER — Encounter: Payer: Self-pay | Admitting: Gastroenterology

## 2021-06-24 ENCOUNTER — Ambulatory Visit: Payer: Commercial Managed Care - PPO | Admitting: Registered Nurse

## 2021-06-24 ENCOUNTER — Ambulatory Visit
Admission: RE | Admit: 2021-06-24 | Discharge: 2021-06-24 | Disposition: A | Discharge status: Home / Self Care | Payer: Commercial Managed Care - PPO | Attending: Gastroenterology | Admitting: Gastroenterology

## 2021-06-24 ENCOUNTER — Other Ambulatory Visit: Payer: Self-pay

## 2021-06-24 ENCOUNTER — Encounter: Payer: Self-pay | Admitting: Gastroenterology

## 2021-06-24 ENCOUNTER — Encounter
Admission: RE | Disposition: A | Discharge status: Home / Self Care | Payer: Self-pay | Source: Home / Self Care | Attending: Gastroenterology

## 2021-06-24 DIAGNOSIS — I2510 Atherosclerotic heart disease of native coronary artery without angina pectoris: Secondary | ICD-10-CM | POA: Diagnosis not present

## 2021-06-24 DIAGNOSIS — E119 Type 2 diabetes mellitus without complications: Secondary | ICD-10-CM | POA: Diagnosis not present

## 2021-06-24 DIAGNOSIS — I252 Old myocardial infarction: Secondary | ICD-10-CM | POA: Insufficient documentation

## 2021-06-24 DIAGNOSIS — Z955 Presence of coronary angioplasty implant and graft: Secondary | ICD-10-CM | POA: Insufficient documentation

## 2021-06-24 DIAGNOSIS — Z7984 Long term (current) use of oral hypoglycemic drugs: Secondary | ICD-10-CM | POA: Insufficient documentation

## 2021-06-24 DIAGNOSIS — Z7982 Long term (current) use of aspirin: Secondary | ICD-10-CM | POA: Diagnosis not present

## 2021-06-24 DIAGNOSIS — Z833 Family history of diabetes mellitus: Secondary | ICD-10-CM | POA: Diagnosis not present

## 2021-06-24 DIAGNOSIS — Z79899 Other long term (current) drug therapy: Secondary | ICD-10-CM | POA: Insufficient documentation

## 2021-06-24 DIAGNOSIS — Z1211 Encounter for screening for malignant neoplasm of colon: Secondary | ICD-10-CM | POA: Insufficient documentation

## 2021-06-24 DIAGNOSIS — I5022 Chronic systolic (congestive) heart failure: Secondary | ICD-10-CM | POA: Insufficient documentation

## 2021-06-24 DIAGNOSIS — E0590 Thyrotoxicosis, unspecified without thyrotoxic crisis or storm: Secondary | ICD-10-CM | POA: Diagnosis not present

## 2021-06-24 DIAGNOSIS — Z8249 Family history of ischemic heart disease and other diseases of the circulatory system: Secondary | ICD-10-CM | POA: Diagnosis not present

## 2021-06-24 DIAGNOSIS — I110 Hypertensive heart disease with heart failure: Secondary | ICD-10-CM | POA: Insufficient documentation

## 2021-06-24 HISTORY — PX: COLONOSCOPY: SHX5424

## 2021-06-24 HISTORY — DX: Type 2 diabetes mellitus without complications: E11.9

## 2021-06-24 HISTORY — DX: Acute myocardial infarction, unspecified: I21.9

## 2021-06-24 LAB — POCT PREGNANCY, URINE: Preg Test, Ur: NEGATIVE

## 2021-06-24 LAB — GLUCOSE, CAPILLARY: Glucose-Capillary: 116 mg/dL — ABNORMAL HIGH (ref 70–99)

## 2021-06-24 SURGERY — COLONOSCOPY
Anesthesia: General

## 2021-06-24 MED ORDER — LIDOCAINE HCL (CARDIAC) PF 100 MG/5ML IV SOSY
PREFILLED_SYRINGE | INTRAVENOUS | Status: DC | PRN
  Administered 2021-06-24: 100 mg via INTRAVENOUS

## 2021-06-24 MED ORDER — METOPROLOL TARTRATE 5 MG/5ML IV SOLN
INTRAVENOUS | Status: AC
  Filled 2021-06-24: qty 5

## 2021-06-24 MED ORDER — PROPOFOL 10 MG/ML IV BOLUS
INTRAVENOUS | Status: DC | PRN
Start: 2021-06-24 — End: 2021-06-24
  Administered 2021-06-24: 90 mg via INTRAVENOUS

## 2021-06-24 MED ORDER — PROPOFOL 500 MG/50ML IV EMUL
INTRAVENOUS | Status: DC | PRN
  Administered 2021-06-24: 150 ug/kg/min via INTRAVENOUS

## 2021-06-24 MED ORDER — HYDRALAZINE HCL 20 MG/ML IJ SOLN
INTRAMUSCULAR | Status: DC | PRN
  Administered 2021-06-24 (×3): 5 mg via INTRAVENOUS

## 2021-06-24 MED ORDER — SODIUM CHLORIDE 0.9 % IV SOLN: INTRAVENOUS | Status: DC

## 2021-06-24 MED ORDER — PROPOFOL 10 MG/ML IV BOLUS
INTRAVENOUS | Status: AC
  Filled 2021-06-24: qty 20

## 2021-06-24 MED ORDER — LABETALOL HCL 5 MG/ML IV SOLN
INTRAVENOUS | Status: DC | PRN
  Administered 2021-06-24: 5 mg via INTRAVENOUS
  Administered 2021-06-24: 10 mg via INTRAVENOUS
  Administered 2021-06-24: 5 mg via INTRAVENOUS

## 2021-06-24 MED ORDER — GLYCOPYRROLATE 0.2 MG/ML IJ SOLN
INTRAMUSCULAR | Status: DC | PRN
  Administered 2021-06-24: .2 mg via INTRAVENOUS

## 2021-06-24 MED ORDER — EPHEDRINE 5 MG/ML INJ
INTRAVENOUS | Status: AC
  Filled 2021-06-24: qty 5

## 2021-06-24 NOTE — Transfer of Care (Signed)
Immediate Anesthesia Transfer of Care Note  Patient: Amber [name]  Procedure(s) Performed: COLONOSCOPY  Patient Location: Endoscopy Unit  Anesthesia Type:General  Level of Consciousness: drowsy  Airway & Oxygen Therapy: Patient Spontanous Breathing  Post-op Assessment: Report given to RN and Post -op Vital signs reviewed and stable  Post vital signs: Reviewed and stable  Last Vitals:  Vitals Value Taken Time  BP 144/116 06/24/21 0858  Temp 35.6 C 06/24/21 0858  Pulse 95 06/24/21 0900  Resp 23 06/24/21 0900  SpO2 97 % 06/24/21 0900  Vitals shown include unvalidated device data.  Last Pain:  Vitals:   06/24/21 0858  TempSrc: Temporal  PainSc: Asleep         Complications: No notable events documented.

## 2021-06-24 NOTE — H&P (Signed)
[name], MD [address], [address], [place], [place], [address] [address], [address], [place], [place], [address] Phone: [phone]  Fax: [phone]  Primary Care Physician:  Doreene Nest, NP   Pre-Procedure History & Physical: HPI:  Amber [name] is a 50 y.o. female is here for an colonoscopy.   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   Thyroid storm     Past Surgical History:  Procedure Laterality Date   CARDIAC CATHETERIZATION     CESAREAN SECTION     x2   CORONARY STENT INTERVENTION N/A 05/22/2019   Procedure: CORONARY STENT INTERVENTION;  Surgeon: Iran Ouch, MD;  [facility]: [facility];  Service: Cardiovascular;  Laterality: N/A;   MASS EXCISION Right 01/18/2013   Procedure: EXCISION right scapular cyst;  Surgeon: Axel Filler, MD;  Location: [facility];  Service: General;  Laterality: Right;   RIGHT/LEFT HEART CATH AND CORONARY ANGIOGRAPHY N/A 05/22/2019   Procedure: RIGHT/LEFT HEART CATH AND CORONARY ANGIOGRAPHY;  Surgeon: Iran Ouch, MD;  Location: [facility];  Service: Cardiovascular;  Laterality: N/A;    Prior to Admission medications   Medication Sig Start Date End Date Taking? Authorizing Provider  aspirin 81 MG chewable tablet Chew 1 tablet (81 mg total) by mouth daily. 06/06/19  Yes Dunn, Ryan M, PA-C  atorvastatin (LIPITOR) 80 MG tablet TAKE 1 TABLET BY MOUTH DAILY AT 6 PM. 01/11/21  Yes Iran Ouch, MD  calcium-vitamin D 250-100 MG-UNIT tablet Take 1 tablet by mouth 2 (two) times daily.   Yes [provider]  ezetimibe (ZETIA) 10 MG tablet Take 1 tablet (10 mg total) by mouth daily. 11/13/20  Yes Iran Ouch, MD  ferrous sulfate 325 (65 FE) MG tablet Take 325 mg by mouth every morning.   Yes [provider]  furosemide (LASIX) 20 MG tablet TAKE 1 TABLET BY MOUTH EVERY DAY 06/14/21  Yes Iran Ouch, MD  isosorbide mononitrate (IMDUR) 30 MG 24 hr tablet TAKE 1/2 TABLET BY MOUTH DAILY 04/08/20  Yes Alver Sorrow, NP  losartan (COZAAR) 50 MG tablet TAKE 1 TABLET (50 MG TOTAL) BY MOUTH DAILY. PLEASE CALL OFFICE TO SCHEDULE FOLLOW UP APPOINTMENT. 06/10/21  Yes Iran Ouch, MD  metFORMIN (GLUCOPHAGE-XR) 500 MG 24 hr tablet Take 1 tablet (500 mg total) by mouth daily with breakfast. For blood sugar 05/26/21  Yes Doreene Nest, NP  methimazole (TAPAZOLE) 5 MG tablet Take 1 tablet (5 mg total) by mouth daily. 02/25/21  Yes Shamleffer, Konrad Dolores, MD  metoprolol succinate (TOPROL-XL) 25 MG 24 hr tablet TAKE 1 TABLET BY MOUTH EVERY DAY 06/14/21  Yes Iran Ouch, MD  potassium chloride SA (K-DUR) 20 MEQ tablet Take 1 tablet (20 mEq total) by mouth daily. 06/13/19  Yes Creig Hines, NP  amoxicillin-clavulanate (AUGMENTIN) 875-125 MG tablet Take 1 tablet by mouth 2 (two) times daily. For infection. Patient not taking: Reported on 06/24/2021 05/20/21   Doreene Nest, NP  nitroGLYCERIN (NITROSTAT) 0.4  MG SL tablet Place 1 tablet (0.4 mg total) under the tongue every 5 (five) minutes as needed for chest pain. 06/17/20 06/17/21  Iran Ouch, MD  polyethylene glycol-electrolytes (NULYTELY) 420 g solution Prepare according to package instructions. Starting at 5:00 PM: Drink one 8 oz glass of mixture every 15 minutes until you finish half of the jug. Five hours prior to procedure, drink 8 oz glass of mixture every 15 minutes until it is all gone. Make sure you do not drink anything 4 hours prior to your procedure. 05/27/21   [name], MD  spironolactone  (ALDACTONE) 25 MG tablet TAKE 1/2 TABLET BY MOUTH EVERY DAY Patient not taking: Reported on 06/24/2021 01/11/21   Iran Ouch, MD    Allergies as of 05/24/2021   (No Known Allergies)    Family History  Problem Relation Age of Onset   Diabetes Father    Diabetes Mother    Heart disease Mother    Diabetes Sister    Hypertension Sister     Social History   Socioeconomic History   Marital status: Married    Spouse name: Not on file   Number of children: 2   Years of education: Not on file   Highest education level: Not on file  Occupational History    Comment: front desk   Tobacco Use   Smoking status: Never   Smokeless tobacco: Never  Vaping Use   Vaping Use: Never used  Substance and Sexual Activity   Alcohol use: Yes    Alcohol/week: 0.0 standard drinks    Comment: rare   Drug use: No   Sexual activity: Not Currently  Other Topics Concern   Not on file  Social History Narrative   From [place].   Married.   2 children.   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   Social Determinants of Health   Financial Resource Strain: Not on file  Food Insecurity: Not on file  Transportation Needs: Not on file  Physical Activity: Not on file  Stress: Not on file  Social Connections: Not on file  Intimate Partner Violence: Not on file    Review of Systems: See HPI, otherwise negative ROS  Physical Exam: BP (!) 162/83   Pulse (!) 56   Temp (!) 96.1 F (35.6 C) (Temporal)   Resp 16   Ht 5\' 4"  (1.626 m)   Wt 108.9 kg   SpO2 99%   BMI 41.20 kg/m  General:   Alert,  pleasant and cooperative in NAD Head:  Normocephalic and atraumatic. Neck:  Supple; no masses or thyromegaly. Lungs:  Clear throughout to auscultation, normal respiratory effort.    Heart:  +S1, +S2, Regular rate and rhythm, No edema. Abdomen:  Soft, nontender and nondistended. Normal bowel sounds, without guarding, and without rebound.    Neurologic:  Alert and  oriented x4;  grossly normal neurologically.  Impression/Plan: Amber [name] is here for an colonoscopy to be performed for Screening colonoscopy average risk   Risks, benefits, limitations, and alternatives regarding  colonoscopy have been reviewed with the patient.  Questions have been answered.  All parties agreeable.   [name], MD  06/24/2021, 8:08 AM

## 2021-06-24 NOTE — Anesthesia Postprocedure Evaluation (Signed)
Anesthesia Post Note  Patient: Amber [name]  Procedure(s) Performed: COLONOSCOPY  Patient location during evaluation: Endoscopy Anesthesia Type: General Level of consciousness: awake and alert Pain management: pain level controlled Vital Signs Assessment: post-procedure vital signs reviewed and stable Respiratory status: spontaneous breathing, nonlabored ventilation and respiratory function stable Cardiovascular status: blood pressure returned to baseline and stable Postop Assessment: no apparent nausea or vomiting Anesthetic complications: no   No notable events documented.   Last Vitals:  Vitals:   06/24/21 0918 06/24/21 0928  BP: (!) 144/89 (!) 155/86  Pulse: 84 85  Resp: (!) 23 20  Temp:    SpO2: 96% 98%    Last Pain:  Vitals:   06/24/21 0928  TempSrc:   PainSc: 0-No pain                 Foye Deer

## 2021-06-24 NOTE — Op Note (Signed)
[facility] Patient Name: Amber [name] Procedure Date: 06/24/2021 8:13 AM MRN: [id number] Account #: [id number] Date of Birth: 1971/10/15 Admit Type: Outpatient Age: 50 Room: [facility] ENDO ROOM 3 Gender: Female Note Status: Finalized Instrument Name: Prentice Docker [id number] Procedure:             Colonoscopy Indications:           Screening for colorectal malignant neoplasm Providers:             [name] MD, MD Referring MD:          Doreene Nest (Referring MD) Medicines:             Monitored Anesthesia Care Complications:         No immediate complications. Procedure:             Pre-Anesthesia Assessment:                        - Prior to the procedure, a History and Physical was                         performed, and patient medications, allergies and                         sensitivities were reviewed. The patient's tolerance                         of previous anesthesia was reviewed.                        - The risks and benefits of the procedure and the                         sedation options and risks were discussed with the                         patient. All questions were answered and informed                         consent was obtained.                        - ASA Grade Assessment: II - A patient with mild                         systemic disease.                        After obtaining informed consent, the colonoscope was                         passed under direct vision. Throughout the procedure,                         the patient's blood pressure, pulse, and oxygen                         saturations were monitored continuously. The                         Colonoscope was introduced  through the anus and                         advanced to the the cecum, identified by the                         appendiceal orifice. The colonoscopy was performed                         with ease. The patient tolerated the procedure well.                          The quality of the bowel preparation was good. Findings:      The perianal and digital rectal examinations were normal.      The entire examined colon appeared normal on direct and retroflexion       views. Impression:            - The entire examined colon is normal on direct and                         retroflexion views.                        - No specimens collected. Recommendation:        - Discharge patient to home (with escort).                        - Resume previous diet.                        - Continue present medications.                        - Repeat colonoscopy in 10 years for screening                         purposes. Procedure Code(s):     --- Professional ---                        [phone], Colonoscopy, flexible; diagnostic, including                         collection of specimen(s) by brushing or washing, when                         performed (separate procedure) Diagnosis Code(s):     --- Professional ---                        Z12.11, Encounter for screening for malignant neoplasm                         of colon CPT copyright 2019 American Medical Association. All rights reserved. The codes documented in this report are preliminary and upon coder review may  be revised to meet current compliance requirements. [name], MD [name] MD, MD 06/24/2021 8:57:21 AM This report has been signed electronically. Number of Addenda: 0 Note Initiated On: 06/24/2021 8:13 AM Scope Withdrawal Time: 0 hours 10 minutes 8 seconds  Total Procedure Duration: 0 hours 22  minutes 47 seconds  Estimated Blood Loss:  Estimated blood loss: none.      [facility]

## 2021-06-24 NOTE — Anesthesia Preprocedure Evaluation (Addendum)
Anesthesia Evaluation  Patient identified by MRN, date of birth, ID band Patient awake    Reviewed: Allergy & Precautions, H&P , NPO status , Patient's Chart, lab work & pertinent test results  Airway Mallampati: III  TM Distance: >3 FB Neck ROM: Full    Dental  (+) Teeth Intact, Dental Advisory Given   Pulmonary sleep apnea ,    Pulmonary exam normal breath sounds clear to auscultation       Cardiovascular Exercise Tolerance: Poor hypertension, Pt. on medications with exertion + CAD, + Past MI and + Cardiac Stents  Normal cardiovascular exam Rhythm:Regular Rate:Normal     Neuro/Psych negative neurological ROS  negative psych ROS   GI/Hepatic negative GI ROS, Neg liver ROS,   Endo/Other  diabetes, Type 2Hyperthyroidism   Renal/GU negative Renal ROS  negative genitourinary   Musculoskeletal negative musculoskeletal ROS (+)   Abdominal (+) + obese,   Peds  Hematology negative hematology ROS (+)   Anesthesia Other Findings Chronic Fatigue  Reproductive/Obstetrics                            Anesthesia Physical  Anesthesia Plan  ASA: 3  Anesthesia Plan: General   Post-op Pain Management:    Induction: Intravenous  PONV Risk Score and Plan: Treatment may vary due to age or medical condition and Propofol infusion  Airway Management Planned: Nasal Cannula and Natural Airway  Additional Equipment:   Intra-op Plan:   Post-operative Plan: Extubation in OR  Informed Consent: I have reviewed the patients History and Physical, chart, labs and discussed the procedure including the risks, benefits and alternatives for the proposed anesthesia with the patient or authorized representative who has indicated his/her understanding and acceptance.       Plan Discussed with: CRNA and Anesthesiologist  Anesthesia Plan Comments:         Anesthesia Quick Evaluation

## 2021-06-25 ENCOUNTER — Encounter: Payer: Self-pay | Admitting: Gastroenterology

## 2021-07-01 NOTE — Telephone Encounter (Signed)
Letter printed to mail. °

## 2021-07-09 ENCOUNTER — Other Ambulatory Visit: Payer: Self-pay | Admitting: Cardiovascular Disease

## 2021-08-09 ENCOUNTER — Telehealth: Payer: Self-pay | Admitting: Cardiovascular Disease

## 2021-08-09 NOTE — Telephone Encounter (Signed)
Patient needs an appt for further refills  

## 2021-08-13 ENCOUNTER — Other Ambulatory Visit: Payer: Self-pay | Admitting: Cardiovascular Disease

## 2021-08-13 NOTE — Telephone Encounter (Signed)
Please contact pt for future appointment. Pt overdue for 6 month f/u.  

## 2021-08-19 NOTE — Telephone Encounter (Signed)
Please see note below. 

## 2021-08-26 ENCOUNTER — Ambulatory Visit: Payer: Commercial Managed Care - PPO | Admitting: Primary Care

## 2021-08-27 ENCOUNTER — Ambulatory Visit: Payer: Commercial Managed Care - PPO | Admitting: Internal Medicine

## 2021-09-15 ENCOUNTER — Other Ambulatory Visit: Payer: Self-pay | Admitting: Cardiovascular Disease

## 2021-09-16 ENCOUNTER — Other Ambulatory Visit: Payer: Self-pay | Admitting: Cardiovascular Disease

## 2021-09-29 ENCOUNTER — Ambulatory Visit: Payer: Commercial Managed Care - PPO | Admitting: Internal Medicine

## 2021-10-08 ENCOUNTER — Other Ambulatory Visit: Payer: Self-pay | Admitting: Cardiovascular Disease

## 2021-10-26 ENCOUNTER — Encounter: Payer: Self-pay | Admitting: Cardiovascular Disease

## 2021-10-26 ENCOUNTER — Ambulatory Visit (INDEPENDENT_AMBULATORY_CARE_PROVIDER_SITE_OTHER): Payer: Commercial Managed Care - PPO | Admitting: Cardiovascular Disease

## 2021-10-26 ENCOUNTER — Other Ambulatory Visit: Payer: Self-pay

## 2021-10-26 DIAGNOSIS — E785 Hyperlipidemia, unspecified: Secondary | ICD-10-CM

## 2021-10-26 DIAGNOSIS — I10 Essential (primary) hypertension: Secondary | ICD-10-CM

## 2021-10-26 DIAGNOSIS — I2510 Atherosclerotic heart disease of native coronary artery without angina pectoris: Secondary | ICD-10-CM | POA: Diagnosis not present

## 2021-10-26 DIAGNOSIS — I5022 Chronic systolic (congestive) heart failure: Secondary | ICD-10-CM

## 2021-10-26 DIAGNOSIS — E079 Disorder of thyroid, unspecified: Secondary | ICD-10-CM

## 2021-10-26 NOTE — Progress Notes (Signed)
Cardiology Office Note   Date:  10/26/2021   ID:  Amber [name], DOB 1971/08/09, MRN [id number]  PCP:  Doreene Nest, NP  Cardiologist:   Lorine Bears, MD   Chief Complaint  Patient presents with   Other    6 month f/u no complaints today. Meds reviewed verbally with pt.      History of Present Illness: Amber [name] is a 51 y.o. female who presents for follow-up visit regarding coronary artery disease and chronic systolic heart failure.  She has other chronic medical conditions including essential hypertension, history of hypothyroidism with previous thyroid storm, anemia and obesity. She was diagnosed with severe hyperthyroidism in August 2020.  She was hospitalized at that time with acute respiratory failure requiring mechanical ventilation.  She had non-STEMI and pulmonary edema.  Echocardiogram showed an EF of 25 to 30% with wall motion abnormalities suggestive of Takotsubo cardiomyopathy versus anterior infarct.  She underwent a right and left cardiac catheterization and was found to have two-vessel coronary artery disease with 80% stenosis in the mid LAD, occluded OM branches and moderate RCA disease.  PCI and drug-eluting stent placement was done to the mid LAD.  Heart failure medications were optimized and hypothyroidism was treated.  Repeat echocardiogram in October showed an EF of 40 to 45%. She was hospitalized in April, 2021 with atypical chest pain and ruled out for myocardial infarction.  Imdur was added.  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  Echocardiogram showed an EF of 50 to 55% with no significant valvular abnormalities.  During last visit, I discontinued clopidogrel.  She has been doing well with no recent chest pain, shortness of breath or palpitations.  Hypothyroidism is being treated with Tapazole.   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 END[facility];  Service: Gastroenterology;  Laterality: N/A;   CORONARY STENT INTERVENTION N/A 05/22/2019   Procedure: CORONARY STENT INTERVENTION;  Surgeon: Iran Ouch, MD;  Location: [facility];  Service: Cardiovascular;  Laterality: N/A;   MASS EXCISION Right 01/18/2013   Procedure: EXCISION right scapular cyst;  Surgeon: Axel Filler, MD;  Location: [facility];  Service: General;  Laterality: Right;   RIGHT/LEFT HEART CATH AND CORONARY ANGIOGRAPHY N/A 05/22/2019   Procedure: RIGHT/LEFT HEART CATH AND CORONARY ANGIOGRAPHY;  Surgeon: Iran Ouch, MD;  Location: [facility];  Service: Cardiovascular;  Laterality: N/A;     Current Outpatient Medications  Medication Sig Dispense Refill   aspirin 81 MG chewable tablet Chew 1 tablet (81 mg total) by mouth daily. 30 tablet 11   atorvastatin (LIPITOR) 80 MG tablet TAKE 1 TABLET BY MOUTH DAILY AT 6 PM. 90 tablet 0   calcium-vitamin D 250-100 MG-UNIT tablet Take  1 tablet by mouth 2 (two) times daily.     ezetimibe (ZETIA) 10 MG tablet TAKE 1 TABLET BY MOUTH EVERY DAY 30 tablet 1   ferrous sulfate 325 (65 FE) MG tablet Take 325 mg by mouth every morning.     furosemide (LASIX) 20 MG tablet TAKE 1 TABLET BY MOUTH EVERY DAY 30 tablet 4   isosorbide mononitrate (IMDUR) 30 MG 24 hr tablet TAKE 1/2 TABLET BY MOUTH DAILY 45 tablet 2   losartan  (COZAAR) 50 MG tablet TAKE 1 TABLET (50 MG TOTAL) BY MOUTH DAILY. PLEASE CALL OFFICE TO SCHEDULE FOLLOW UP APPOINTMENT. 90 tablet 1   metFORMIN (GLUCOPHAGE-XR) 500 MG 24 hr tablet Take 1 tablet (500 mg total) by mouth daily with breakfast. For blood sugar 90 tablet 1   methimazole (TAPAZOLE) 5 MG tablet Take 1 tablet (5 mg total) by mouth daily. 90 tablet 3   metoprolol succinate (TOPROL-XL) 25 MG 24 hr tablet TAKE 1 TABLET BY MOUTH EVERY DAY 30 tablet 11   nitroGLYCERIN (NITROSTAT) 0.4 MG SL tablet Place 1 tablet (0.4 mg total) under the tongue every 5 (five) minutes as needed for chest pain. 25 tablet 1   potassium chloride SA (K-DUR) 20 MEQ tablet Take 1 tablet (20 mEq total) by mouth daily. 90 tablet 3   spironolactone (ALDACTONE) 25 MG tablet TAKE 1/2 TABLET BY MOUTH EVERY DAY 15 tablet 1   No current facility-administered medications for this visit.    Allergies:   Patient has no known allergies.    Social History:  The patient  reports that she has never smoked. She has never used smokeless tobacco. She reports current alcohol use. She reports that she does not use drugs.   Family History:  The patient's family history includes Diabetes in her father, mother, and sister; Heart disease in her mother; Hypertension in her sister.    ROS:  Please see the history of present illness.   Otherwise, review of systems are positive for none.   All other systems are reviewed and negative.    PHYSICAL EXAM: VS:  BP 120/80 (BP Location: Left Arm, Patient Position: Sitting, Cuff Size: Large)    Pulse 74    Ht 5\' 4"  (1.626 m)    Wt 239 lb 8 oz (108.6 kg)    SpO2 98%    BMI 41.11 kg/m  , BMI Body mass index is 41.11 kg/m. GEN: Well nourished, well developed, in no acute distress  HEENT: normal  Neck: no JVD, carotid bruits, or masses Cardiac: RRR; no murmurs, rubs, or gallops,no edema  Respiratory:  clear to auscultation bilaterally, normal work of breathing GI: soft, nontender, nondistended, +  BS MS: no deformity or atrophy  Skin: warm and dry, no rash Neuro:  Strength and sensation are intact Psych: euthymic mood, full affect   EKG:  EKG is ordered today. The ekg ordered today demonstrates normal sinus rhythm with moderate LVH  Recent Labs: 02/24/2021: TSH 0.41 05/19/2021: ALT 11; BUN 13; Creatinine, Ser 0.75; Potassium 3.8; Sodium 141 05/20/2021: Hemoglobin 12.3; Platelets 426.0    Lipid Panel    Component Value Date/Time   CHOL 161 05/19/2021 1111   CHOL 176 01/05/2021 1504   TRIG 110.0 05/19/2021 1111   HDL 38.50 (L) 05/19/2021 1111   HDL 49 01/05/2021 1504   CHOLHDL 4 05/19/2021 1111   VLDL 22.0 05/19/2021 1111   LDLCALC 100 (H) 05/19/2021 1111   LDLCALC 98 01/05/2021 1504      Wt Readings  from Last 3 Encounters:  10/26/21 239 lb 8 oz (108.6 kg)  06/24/21 240 lb (108.9 kg)  05/19/21 242 lb (109.8 kg)       No flowsheet data found.    ASSESSMENT AND PLAN:  1.  Coronary artery disease involving native coronary arteries without angina: She is doing very well with no anginal symptoms.  Continue medical therapy.  Continue aspirin 81 mg once daily indefinitely.  Most recent echocardiogram showed an EF of 50 to 55%.  2.  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  Continue losartan, Toprol and spironolactone.  Check basic metabolic profile today.  3.  Thyroid disease: Managed by endocrinology.  4 essential hypertension: Blood pressure is well controlled  5.  Hyperlipidemia: The 2 most recent lipid profile showed an LDL of 100.  She is already on maximal dose of atorvastatin as well as Zetia.  I discussed with her the importance of heart healthy diet and we will plan on repeat lipid profile in the next 6 months.  If LDL is above 70, I discussed with her the option of starting a PCSK9 inhibitor.    Disposition:   FU with me in 6  months  Signed,  Lorine Bears, MD  10/26/2021 3:37 PM    [facility]

## 2021-10-26 NOTE — Patient Instructions (Signed)
Medication Instructions:  Your physician recommends that you continue on your current medications as directed. Please refer to the Current Medication list given to you today.  *If you need a refill on your cardiac medications before your next appointment, please call your pharmacy*   Lab Work: None ordered If you have labs (blood work) drawn today and your tests are completely normal, you will receive your results only by: MyChart Message (if you have MyChart) OR A paper copy in the mail If you have any lab test that is abnormal or we need to change your treatment, we will call you to review the results.   Testing/Procedures: None ordered   Follow-Up: At [facility], you and your health needs are our priority.  As part of our continuing mission to provide you with exceptional heart care, we have created designated Provider Care Teams.  These Care Teams include your primary Cardiologist (physician) and Advanced Practice Providers (APPs -  Physician Assistants and Nurse Practitioners) who all work together to provide you with the care you need, when you need it.  We recommend signing up for the patient portal called "MyChart".  Sign up information is provided on this After Visit Summary.  MyChart is used to connect with patients for Virtual Visits (Telemedicine).  Patients are able to view lab/test results, encounter notes, upcoming appointments, etc.  Non-urgent messages can be sent to your provider as well.   To learn more about what you can do with MyChart, go to [email or web address].    Your next appointment:   Your physician wants you to follow-up in: 6 months You will receive a reminder letter in the mail two months in advance. If you don't receive a letter, please call our office to schedule the follow-up appointment.   The format for your next appointment:   In Person  Provider:   You may see Lorine Bears, MD or one of the following Advanced Practice Providers on your  designated Care Team:   Nicolasa Ducking, NP Eula Listen, PA-C Cadence Fransico Michael, PA-C{    Other Instructions  Heart-Healthy Eating Plan Heart-healthy meal planning includes: Eating less unhealthy fats. Eating more healthy fats. Making other changes in your diet. Talk with your doctor or a diet specialist (dietitian) to create an eating plan that is right for you.  What are tips for following this plan?  Cooking Avoid frying your food. Try to bake, boil, grill, or broil it instead. You can also reduce fat by: Removing the skin from poultry. Removing all visible fats from meats. Steaming vegetables in water or broth. Meal planning  At meals, divide your plate into four equal parts: Fill one-half of your plate with vegetables and green salads. Fill one-fourth of your plate with whole grains. Fill one-fourth of your plate with lean protein foods. Eat 4-5 servings of vegetables per day. A serving of vegetables is: 1 cup of raw or cooked vegetables. 2 cups of raw leafy greens. Eat 4-5 servings of fruit per day. A serving of fruit is: 1 medium whole fruit.  cup of dried fruit.  cup of fresh, frozen, or canned fruit.  cup of 100% fruit juice. Eat more foods that have soluble fiber. These are apples, broccoli, carrots, beans, peas, and barley. Try to get 20-30 g of fiber per day. Eat 4-5 servings of nuts, legumes, and seeds per week: 1 serving of dried beans or legumes equals  cup after being cooked. 1 serving of nuts is  cup. 1 serving of seeds  equals 1 tablespoon. General information Eat more home-cooked food. Eat less restaurant, buffet, and fast food. Limit or avoid alcohol. Limit foods that are high in starch and sugar. Avoid fried foods. Lose weight if you are overweight. Keep track of how much salt (sodium) you eat. This is important if you have high blood pressure. Ask your doctor to tell you more about this. Try to add vegetarian meals each week. Fats Choose  healthy fats. These include olive oil and canola oil, flaxseeds, walnuts, almonds, and seeds. Eat more omega-3 fats. These include salmon, mackerel, sardines, tuna, flaxseed oil, and ground flaxseeds. Try to eat fish at least 2 times each week. Check food labels. Avoid foods with trans fats or high amounts of saturated fat. Limit saturated fats. These are often found in animal products, such as meats, butter, and cream. These are also found in plant foods, such as palm oil, palm kernel oil, and coconut oil. Avoid foods with partially hydrogenated oils in them. These have trans fats. Examples are stick margarine, some tub margarines, cookies, crackers, and other baked goods. What foods can I eat? Fruits All fresh, canned (in natural juice), or frozen fruits. Vegetables Fresh or frozen vegetables (raw, steamed, roasted, or grilled). Green salads. Grains Most grains. Choose whole wheat and whole grains most of the time. Rice and pasta, including brown rice and pastas made with whole wheat. Meats and other proteins Lean, well-trimmed beef, veal, pork, and lamb. Chicken and [place] without skin. All fish and shellfish. Wild duck, rabbit, pheasant, and venison. Egg whites or low-cholesterol egg substitutes. Dried beans, peas, lentils, and tofu. Seeds and most nuts. Dairy Low-fat or nonfat cheeses, including ricotta and mozzarella. Skim or 1% milk that is liquid, powdered, or evaporated. Buttermilk that is made with low-fat milk. Nonfat or low-fat yogurt. Fats and oils Non-hydrogenated (trans-free) margarines. Vegetable oils, including soybean, sesame, sunflower, olive, peanut, safflower, corn, canola, and cottonseed. Salad dressings or mayonnaise made with a vegetable oil. Beverages Mineral water. Coffee and tea. Diet carbonated beverages. Sweets and desserts Sherbet, gelatin, and fruit ice. Small amounts of dark chocolate. Limit all sweets and desserts. Seasonings and condiments All seasonings  and condiments. The items listed above may not be a complete list of foods and drinks you can eat. Contact a dietitian for more options. What foods should I avoid? Fruits Canned fruit in heavy syrup. Fruit in cream or butter sauce. Fried fruit. Limit coconut. Vegetables Vegetables cooked in cheese, cream, or butter sauce. Fried vegetables. Grains Breads that are made with saturated or trans fats, oils, or whole milk. Croissants. Sweet rolls. Donuts. High-fat crackers, such as cheese crackers. Meats and other proteins Fatty meats, such as hot dogs, ribs, sausage, bacon, rib-eye roast or steak. High-fat deli meats, such as salami and bologna. Caviar. Domestic duck and goose. Organ meats, such as liver. Dairy Cream, sour cream, cream cheese, and creamed cottage cheese. Whole-milk cheeses. Whole or 2% milk that is liquid, evaporated, or condensed. Whole buttermilk. Cream sauce or high-fat cheese sauce. Yogurt that is made from whole milk. Fats and oils Meat fat, or shortening. Cocoa butter, hydrogenated oils, palm oil, coconut oil, palm kernel oil. Solid fats and shortenings, including bacon fat, salt pork, lard, and butter. Nondairy cream substitutes. Salad dressings with cheese or sour cream. Beverages Regular sodas and juice drinks with added sugar. Sweets and desserts Frosting. Pudding. Cookies. Cakes. Pies. Milk chocolate or white chocolate. Buttered syrups. Full-fat ice cream or ice cream drinks. The items listed above may  not be a complete list of foods and drinks to avoid. Contact a dietitian for more information. Summary Heart-healthy meal planning includes eating less unhealthy fats, eating more healthy fats, and making other changes in your diet. Eat a balanced diet. This includes fruits and vegetables, low-fat or nonfat dairy, lean protein, nuts and legumes, whole grains, and heart-healthy oils and fats. This information is not intended to replace advice given to you by your health  care provider. Make sure you discuss any questions you have with your health care provider. Document Revised: 02/11/2021 Document Reviewed: 02/11/2021 Elsevier Patient Education  2022 [employer].

## 2021-10-27 ENCOUNTER — Encounter: Payer: Self-pay | Admitting: Internal Medicine

## 2021-10-27 ENCOUNTER — Ambulatory Visit: Payer: Commercial Managed Care - PPO | Admitting: Internal Medicine

## 2021-10-27 VITALS — BP 138/82 | HR 61 | Ht 64.0 in | Wt 238.0 lb

## 2021-10-27 DIAGNOSIS — E0500 Thyrotoxicosis with diffuse goiter without thyrotoxic crisis or storm: Secondary | ICD-10-CM | POA: Diagnosis not present

## 2021-10-27 DIAGNOSIS — E0590 Thyrotoxicosis, unspecified without thyrotoxic crisis or storm: Secondary | ICD-10-CM | POA: Diagnosis not present

## 2021-10-27 NOTE — Progress Notes (Signed)
Name: Amber [name]  MRN/ DOB: [id number], [birth year]    Age/ Sex: 51 y.o., female     PCP: [name], NP   Reason for Endocrinology Evaluation: Hyperthytroidism     Initial Endocrinology Clinic Visit: 04/03/2019    PATIENT IDENTIFIER: [name] is a 51 y.o., female with a past medical history of HTN and CAD (S/P PCI 05/2019).She has followed with [facility] since 04/03/2019 for consultative assistance with management of her Hyperthyrodism  HISTORICAL SUMMARY: The patient was first diagnosed with hyperthyroidism in 03/2019, with a suppressed TSH at < 0.01 uIU/mL with elevated FT4 5.96 ng/dL. She did have weight loss  For ~ 4 months prior to her presentation.  Methimazole started in 03/2019  SUBJECTIVE:     Today (10/27/2021):  Amber [name] is here for a follow up on hyperthyroidism.   She has been noted with weight loss  Denies fever or abdominal pain, or fever  No burring or itching in the eyes Denies palpitations  Denies tremors  Has occasional local neck swelling   Last eye exam 08/2020- has upcoming appointment   Methimazole 5 mg daily     HISTORY:  Past Medical History:  Past Medical History:  Diagnosis Date   Acute encephalopathy    Acute pulmonary edema (HCC)    Acute respiratory failure with hypoxia ([place]) 05/20/2019   Anemia    CAD (coronary artery disease)    Community acquired pneumonia    Diabetes mellitus without complication (HCC)    HFrEF (heart failure with reduced ejection fraction) ([place])    HTN (hypertension)    Hyperthyroidism    Myocardial infarction ([place])    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   Thyroid storm    Past Surgical History:  Past Surgical History:  Procedure Laterality Date   CARDIAC CATHETERIZATION     CESAREAN SECTION     x2   COLONOSCOPY N/A 06/24/2021   Procedure: COLONOSCOPY;  Surgeon: Jonathon Bellows, MD;  Location[facility];  Service: Gastroenterology;  Laterality: N/A;   CORONARY STENT  INTERVENTION N/A 05/22/2019   Procedure: CORONARY STENT INTERVENTION;  Surgeon: Wellington Hampshire, MD;  Location: [place] CV LAB;  Service: Cardiovascular;  Laterality: N/A;   MASS EXCISION Right 01/18/2013   Procedure: EXCISION right scapular cyst;  Surgeon: Ralene Ok, MD;  Location: [facility] ORS;  Service: General;  Laterality: Right;   RIGHT/LEFT HEART CATH AND CORONARY ANGIOGRAPHY N/A 05/22/2019   Procedure: RIGHT/LEFT HEART CATH AND CORONARY ANGIOGRAPHY;  Surgeon: Wellington Hampshire, MD;  Location: [place] CV LAB;  Service: Cardiovascular;  Laterality: N/A;   Social History:  reports that she has never smoked. She has never used smokeless tobacco. She reports current alcohol use. She reports that she does not use drugs. Family History:  Family History  Problem Relation Age of Onset   Diabetes Father    Diabetes Mother    Heart disease Mother    Diabetes Sister    Hypertension Sister      HOME MEDICATIONS: Allergies as of 10/27/2021   No Known Allergies      Medication List        Accurate as of October 27, 2021  3:36 PM. If you have any questions, ask your nurse or doctor.          aspirin 81 MG chewable tablet Chew 1 tablet (81 mg total) by mouth daily.   atorvastatin 80 MG tablet Commonly known as: LIPITOR TAKE 1 TABLET BY MOUTH DAILY  AT 6 PM.   calcium-vitamin D 250-100 MG-UNIT tablet Take 1 tablet by mouth 2 (two) times daily.   ezetimibe 10 MG tablet Commonly known as: ZETIA TAKE 1 TABLET BY MOUTH EVERY DAY   ferrous sulfate 325 (65 FE) MG tablet Take 325 mg by mouth every morning.   furosemide 20 MG tablet Commonly known as: LASIX TAKE 1 TABLET BY MOUTH EVERY DAY   isosorbide mononitrate 30 MG 24 hr tablet Commonly known as: IMDUR TAKE 1/2 TABLET BY MOUTH DAILY   losartan 50 MG tablet Commonly known as: COZAAR TAKE 1 TABLET (50 MG TOTAL) BY MOUTH DAILY. PLEASE CALL OFFICE TO SCHEDULE FOLLOW UP APPOINTMENT.   metFORMIN 500 MG 24 hr  tablet Commonly known as: GLUCOPHAGE-XR Take 1 tablet (500 mg total) by mouth daily with breakfast. For blood sugar   methimazole 5 MG tablet Commonly known as: TAPAZOLE Take 1 tablet (5 mg total) by mouth daily.   metoprolol succinate 25 MG 24 hr tablet Commonly known as: TOPROL-XL TAKE 1 TABLET BY MOUTH EVERY DAY   nitroGLYCERIN 0.4 MG SL tablet Commonly known as: Nitrostat Place 1 tablet (0.4 mg total) under the tongue every 5 (five) minutes as needed for chest pain.   potassium chloride SA 20 MEQ tablet Commonly known as: KLOR-CON M Take 1 tablet (20 mEq total) by mouth daily.   spironolactone 25 MG tablet Commonly known as: ALDACTONE TAKE 1/2 TABLET BY MOUTH EVERY DAY          OBJECTIVE:   PHYSICAL EXAM: VS: BP 138/82 (BP Location: Left Arm, Patient Position: Sitting, Cuff Size: Large)    Pulse 61    Ht 5' 4" (1.626 m)    Wt 238 lb (108 kg)    SpO2 99%    BMI 40.85 kg/m    EXAM: General: Pt appears well and is in NAD  Neck: General: Supple without adenopathy. Thyroid: Thyroid size is prominent.  No nodules appreciated.   Lungs: Clear with good BS bilat with no rales, rhonchi, or wheezes  Heart: Auscultation: RRR.  Extremities:  BL LE: No pretibial edema normal ROM and strength.  Mental Status: Mood and affect: No depression, anxiety, or agitation     DATA REVIEWED:   Latest Reference Range & Units 10/27/21 15:39  TSH 0.35 - 5.50 uIU/mL <0.01 (L)  T4,Free(Direct) 0.60 - 1.60 ng/dL 1.12      Latest Reference Range & Units Most Recent  Sodium 135 - 145 mEq/L 141 05/19/21 11:11  Potassium 3.5 - 5.1 mEq/L 3.8 05/19/21 11:11  Chloride 96 - 112 mEq/L 104 05/19/21 11:11  CO2 19 - 32 mEq/L 27 05/19/21 11:11  Glucose 70 - 99 mg/dL 120 (H) 05/19/21 11:11  BUN 6 - 23 mg/dL 13 05/19/21 11:11  Creatinine 0.40 - 1.20 mg/dL 0.75 05/19/21 11:11  Calcium 8.4 - 10.5 mg/dL 9.3 05/19/21 11:11  Anion gap 5 - 15  8 02/12/20 04:13  BUN/Creatinine Ratio 9 - 23  18 01/05/21 15:04   eGFR >59 mL/min/1.73 109 01/05/21 15:04  Calcium Ionized 1.15 - 1.40 mmol/L 1.15 - 1.40 mmol/L 1.22 05/22/19 09:38 1.25 05/22/19 09:38  Phosphorus 2.5 - 4.6 mg/dL 4.9 (H) 05/24/19 04:31  Magnesium 1.7 - 2.4 mg/dL 2.0 05/24/19 04:31  Alkaline Phosphatase 39 - 117 U/L 71 05/19/21 11:11  Albumin 3.5 - 5.2 g/dL 4.1 05/19/21 11:11  Albumin/Globulin Ratio 1.2 - 2.2  1.5 01/05/21 15:04  AST 0 - 37 U/L 13 05/19/21 11:11  ALT 0 - 35 U/L 11  05/19/21 11:11  Total Protein 6.0 - 8.3 g/dL 7.4 05/19/21 11:11  Bilirubin, Direct 0.0 - 0.2 mg/dL <0.1 12/09/19 14:20  Indirect Bilirubin 0.3 - 0.9 mg/dL NOT CALCULATED 12/09/19 14:20  Total Bilirubin 0.2 - 1.2 mg/dL 0.3 05/19/21 11:11  GFR >60.00 mL/min 92.51 05/19/21 11:11  GFR, Est Non African American >60 mL/min >60 02/12/20 04:13  GFR, Est African American >60 mL/min >60 02/12/20 04:13    Latest Reference Range & Units Most Recent  WBC 4.0 - 10.5 K/uL 12.2 (H) 05/20/21 09:16  RBC 3.87 - 5.11 Mil/uL 4.60 05/20/21 09:16  Hemoglobin 12.0 - 15.0 g/dL 12.3 05/20/21 09:16  HCT 36.0 - 46.0 % 36.3 05/20/21 09:16  MCV 78.0 - 100.0 fl 78.8 05/20/21 09:16  MCH 26.0 - 34.0 pg 27.6 02/13/20 03:19  MCHC 30.0 - 36.0 g/dL 33.8 05/20/21 09:16  RDW 11.5 - 15.5 % 15.0 05/20/21 09:16  Platelets 150.0 - 400.0 K/uL 426.0 (H) 05/20/21 09:16  MPV 7.5 - 12.5 fL 12.5 03/28/19 15:16  nRBC 0.0 - 0.2 % 0.0 02/13/20 03:19  Neutrophils 43.0 - 77.0 % 55.0 05/20/21 09:16  NEUT% 38.4 - 76.8 % 51.1 02/12/14 08:08  Lymphocytes 12.0 - 46.0 % 35.7 05/20/21 09:16  LYMPH% 14.0 - 49.7 % 38.2 02/12/14 08:08  Monocytes Relative 3.0 - 12.0 % 6.9 05/20/21 09:16  MONO% 0.0 - 14.0 % 8.5 02/12/14 08:08  Eosinophil 0.0 - 5.0 % 1.3 05/20/21 09:16  EOS% 0.0 - 7.0 % 1.4 02/12/14 08:08  Basophil 0.0 - 3.0 % 1.1 05/20/21 09:16  BASO% 0.0 - 2.0 % 0.8 02/12/14 08:08  Immature Granulocytes % 0 02/12/20 04:13  NEUT# 1.4 - 7.7 K/uL 6.7 05/20/21 09:16  Lymphocyte # 0.7 - 4.0 K/uL 4.4 (H) 05/20/21 09:16  lymph# 0.9 - 3.3  10e3/uL 4.4 (H) 02/12/14 08:08  Total Lymphocyte % 34.9 03/28/19 15:16  Monocyte # 0.1 - 1.0 K/uL 0.8 05/20/21 09:16  MONO# 0.1 - 0.9 10e3/uL 1.0 (H) 02/12/14 08:08  Eosinophils Absolute 0.0 - 0.7 K/uL 0.2 05/20/21 09:16  Basophils Absolute 0.0 - 0.1 K/uL 0.1 05/20/21 09:16  Abs Immature Granulocytes 0.00 - 0.07 K/uL 0.07 02/12/20 04:13  PATHOLOGIST SMEAR REVIEW  Rpt 03/28/19 15:16  RBC Comments Within Normal Limits  Within Normal Limits 01/10/13 11:59  White Cell Comments  Variant Lymphs 01/10/13 11:59  Smear Result  Smear Available 01/10/13 11:59  PLT EST Adequate  Adequate 01/10/13 11:59  Absolute Monocytes 200 - 950 cells/uL 1,062 (H) 03/28/19 15:16    ASSESSMENT / PLAN / RECOMMENDATIONS:   Hyperthyroidism Secondary to Graves' Disease:  - Pt is clinically euthyroid  - Has noted occasional neck swelling- will proceed with thyroid ultrasound  - We had discussed options of RAI ablation vs surgery vs continuing on Methimazole . I would not recommend sx due to hx of CAD but she may have RAI , pt opted to remain on methimazole  -I have verified consistent pickup history to [facility]  Medications  Increase methimazole 5 mg to 1.5 tabs daily   2. Graves' Disease:   - No extrathyroidal manifestations of graves' disease.    Follow-up in 6 months Labs in 8 weeks  Signed electronically by: Mack Guise, MD  [facility]  [address] [place], [place] [address] Phone: [phone] FAX: [phone]      CC: [name], NP [place] [address] Phone: [phone]  Fax: [phone]   Return to Endocrinology clinic  as below: No future appointments.

## 2021-10-28 ENCOUNTER — Telehealth: Payer: Self-pay | Admitting: Internal Medicine

## 2021-10-28 LAB — TSH: TSH: <0.01 u[IU]/mL — ABNORMAL LOW (ref 0.35–5.50)

## 2021-10-28 LAB — T4, FREE: Free T4: 1.12 ng/dL (ref 0.60–1.60)

## 2021-10-28 MED ORDER — METHIMAZOLE 5 MG PO TABS: 7.5000 mg | ORAL_TABLET | Freq: Every day | ORAL | 6 refills | Status: DC

## 2021-10-28 NOTE — Telephone Encounter (Signed)
Vm left for patient to callback for results °

## 2021-10-28 NOTE — Telephone Encounter (Signed)
Please let the patient know that her thyroid is overactive again.   Please verify that she has been taking methimazole daily and did not skip any doses   In the meantime please increase methimazole 5 mg to 1 and a 1/2 tablets daily  Please schedule for repeat labs in 2 months   Thanks

## 2021-10-28 NOTE — Addendum Note (Signed)
Addended by: Dorita Sciara on: 10/28/2021 12:55 PM   Modules accepted: Orders

## 2021-10-29 NOTE — Telephone Encounter (Signed)
Another vm left for patient to callback  

## 2021-10-29 NOTE — Telephone Encounter (Signed)
Patient has been notified and will call back for labs

## 2021-11-06 ENCOUNTER — Other Ambulatory Visit: Payer: Self-pay | Admitting: Cardiovascular Disease

## 2021-11-10 ENCOUNTER — Other Ambulatory Visit: Payer: Self-pay | Admitting: Primary Care

## 2021-11-10 ENCOUNTER — Other Ambulatory Visit: Payer: Self-pay | Admitting: Cardiovascular Disease

## 2021-11-10 DIAGNOSIS — E1165 Type 2 diabetes mellitus with hyperglycemia: Secondary | ICD-10-CM

## 2021-11-10 NOTE — Telephone Encounter (Signed)
Called pt to get scheduled. Lvmtcb to do so °

## 2021-11-10 NOTE — Telephone Encounter (Signed)
Patient is due for diabetes follow up. She no showed visit for November 2022. Please reschedule.

## 2021-11-11 NOTE — Telephone Encounter (Signed)
Unable to lm to schedule a follow up  °

## 2021-12-16 ENCOUNTER — Other Ambulatory Visit: Payer: Self-pay | Admitting: Primary Care

## 2021-12-16 DIAGNOSIS — E1165 Type 2 diabetes mellitus with hyperglycemia: Secondary | ICD-10-CM

## 2021-12-17 NOTE — Telephone Encounter (Signed)
Support Pool: ? ?Patient is due for diabetes follow up. ?Please schedule, thanks! ?

## 2021-12-30 ENCOUNTER — Emergency Department (HOSPITAL_COMMUNITY): Payer: Worker's Compensation

## 2021-12-30 ENCOUNTER — Encounter (HOSPITAL_COMMUNITY): Payer: Self-pay | Admitting: Emergency Medicine

## 2021-12-30 ENCOUNTER — Other Ambulatory Visit: Payer: Self-pay

## 2021-12-30 ENCOUNTER — Emergency Department (HOSPITAL_COMMUNITY)
Admission: EM | Admit: 2021-12-30 | Discharge: 2021-12-31 | Disposition: A | Discharge status: Home / Self Care | Payer: Worker's Compensation | Attending: Emergency Medicine | Admitting: Emergency Medicine

## 2021-12-30 DIAGNOSIS — Z79899 Other long term (current) drug therapy: Secondary | ICD-10-CM | POA: Insufficient documentation

## 2021-12-30 DIAGNOSIS — I10 Essential (primary) hypertension: Secondary | ICD-10-CM | POA: Insufficient documentation

## 2021-12-30 DIAGNOSIS — Z7984 Long term (current) use of oral hypoglycemic drugs: Secondary | ICD-10-CM | POA: Insufficient documentation

## 2021-12-30 DIAGNOSIS — Z7982 Long term (current) use of aspirin: Secondary | ICD-10-CM | POA: Insufficient documentation

## 2021-12-30 DIAGNOSIS — I2510 Atherosclerotic heart disease of native coronary artery without angina pectoris: Secondary | ICD-10-CM | POA: Diagnosis not present

## 2021-12-30 DIAGNOSIS — S161XXA Strain of muscle, fascia and tendon at neck level, initial encounter: Secondary | ICD-10-CM | POA: Diagnosis not present

## 2021-12-30 DIAGNOSIS — S139XXA Sprain of joints and ligaments of unspecified parts of neck, initial encounter: Secondary | ICD-10-CM

## 2021-12-30 DIAGNOSIS — W100XXA Fall (on)(from) escalator, initial encounter: Secondary | ICD-10-CM | POA: Diagnosis not present

## 2021-12-30 DIAGNOSIS — Y990 Civilian activity done for income or pay: Secondary | ICD-10-CM | POA: Diagnosis not present

## 2021-12-30 DIAGNOSIS — S169XXA Unspecified injury of muscle, fascia and tendon at neck level, initial encounter: Secondary | ICD-10-CM | POA: Diagnosis present

## 2021-12-30 NOTE — ED Provider Triage Note (Signed)
Emergency Medicine Provider Triage Evaluation Note ? ?Amber [name] , a 51 y.o. female  was evaluated in triage.  Pt complains of neck pain.  States she was going down an escalator at work when it began to fall apart and break.  States she was shook violently but never hit her head or passed out.  States since this occurred she has had severe neck pain, mostly on left side.  She denies any numbness or weakness of her arms or legs.  No intervention tried PTA. ? ?Review of Systems  ?Positive: Neck pain ?Negative: fever ? ?Physical Exam  ?BP (!) 180/86 (BP Location: Right Arm)   Pulse (!) 57   Temp 98 ?F (36.7 ?C) (Oral)   Resp 18   SpO2 100%  ? ?Gen:   Awake, no distress   ?Resp:  Normal effort  ?MSK:   Moves extremities without difficulty  ?Other:  Limited ROM of neck during exam, tenderness midline and to the left, moving arms/legs well, ambulatory ? ?Medical Decision Making  ?Medically screening exam initiated at 10:32 PM.  Appropriate orders placed.  Amber [name] was informed that the remainder of the evaluation will be completed by another provider, this initial triage assessment does not replace that evaluation, and the importance of remaining in the ED until their evaluation is complete. ? ?Neck pain.  Head was jerked back and forth, however no focal trauma to the head or neck.  No focal deficits noted in triage.  Will apply cervical collar and get CT neck. ?  ?Garlon Hatchet, PA-C ?12/30/21 2234 ? ?

## 2021-12-30 NOTE — ED Triage Notes (Signed)
Pt reported to ED for evaluation of neck pain after experiencing whiplash from "broken elevator collapsing" at work. Pt denies any fall but is reporting pain to back of neck and left shoulder.  ?

## 2021-12-31 NOTE — ED Provider Notes (Signed)
?[facility] EMERGENCY DEPARTMENT ?Provider Note ? ? ?CSN: [id number] ?Arrival date & time: 12/30/21  2124 ? ?  ? ?History ? ?Chief Complaint  ?Patient presents with  ? Neck Injury  ? ? ?Amber [name] is a 51 y.o. female. ? ?The history is provided by the patient.  ?Neck Injury ?She has history of hypertension, hyperlipidemia, prediabetes, coronary artery disease, Graves' disease, and was on an escalator when it suddenly stopped and she stated that there was a lot of shaking which has led to pain in her neck and in her left shoulder.  She denies any weakness, numbness, tingling.  She did not fall and denies any direct trauma.  She has been ambulatory. ?  ?Home Medications ?Prior to Admission medications   ?Medication Sig Start Date End Date Taking? Authorizing Provider  ?aspirin 81 MG chewable tablet Chew 1 tablet (81 mg total) by mouth daily. 06/06/19  Yes Dunn, Raymon Mutton, PA-C  ?atorvastatin (LIPITOR) 80 MG tablet TAKE 1 TABLET BY MOUTH DAILY AT 6 PM. ?Patient taking differently: Take 80 mg by mouth every evening. 09/16/21  Yes Iran Ouch, MD  ?calcium-vitamin D 250-100 MG-UNIT tablet Take 1 tablet by mouth daily.   Yes [provider]  ?diphenhydrAMINE (BENADRYL) 25 MG tablet Take 25 mg by mouth every 6 (six) hours as needed for allergies.   Yes [provider]  ?ezetimibe (ZETIA) 10 MG tablet TAKE 1 TABLET BY MOUTH EVERY DAY ?Patient taking differently: Take 10 mg by mouth daily. 11/10/21  Yes Iran Ouch, MD  ?ferrous sulfate 325 (65 FE) MG tablet Take 325 mg by mouth 3 (three) times a week. No set days   Yes [provider]  ?furosemide (LASIX) 20 MG tablet TAKE 1 TABLET BY MOUTH EVERY DAY ?Patient taking differently: Take 20 mg by mouth daily. 11/10/21  Yes Iran Ouch, MD  ?isosorbide mononitrate (IMDUR) 30 MG 24 hr tablet TAKE 1/2 TABLET BY MOUTH DAILY ?Patient taking differently: Take 15 mg by mouth daily. 04/08/20  Yes Alver Sorrow, NP  ?losartan  (COZAAR) 50 MG tablet TAKE 1 TABLET (50 MG TOTAL) BY MOUTH DAILY. PLEASE CALL OFFICE TO SCHEDULE FOLLOW UP APPOINTMENT. ?Patient taking differently: Take 50 mg by mouth daily. 08/12/21  Yes Iran Ouch, MD  ?metFORMIN (GLUCOPHAGE-XR) 500 MG 24 hr tablet TAKE 1 TAB DAILY WITH BREAKFAST FOR DIABETES NEED OFFICE VISIT FOR REFILLS ?Patient taking differently: Take 500 mg by mouth daily with breakfast. 12/17/21  Yes Doreene Nest, NP  ?methimazole (TAPAZOLE) 5 MG tablet Take 1.5 tablets (7.5 mg total) by mouth daily. 10/28/21  Yes Shamleffer, Konrad Dolores, MD  ?metoprolol succinate (TOPROL-XL) 25 MG 24 hr tablet TAKE 1 TABLET BY MOUTH EVERY DAY ?Patient taking differently: Take 25 mg by mouth daily. 06/14/21  Yes Iran Ouch, MD  ?nitroGLYCERIN (NITROSTAT) 0.4 MG SL tablet Place 1 tablet (0.4 mg total) under the tongue every 5 (five) minutes as needed for chest pain. 06/17/20 12/31/21 Yes Iran Ouch, MD  ?spironolactone (ALDACTONE) 25 MG tablet TAKE 1/2 TABLET BY MOUTH EVERY DAY ?Patient taking differently: Take 12.5 mg by mouth daily. 11/08/21  Yes Iran Ouch, MD  ?potassium chloride SA (K-DUR) 20 MEQ tablet Take 1 tablet (20 mEq total) by mouth daily. ?Patient not taking: Reported on 12/31/2021 06/13/19   Creig Hines, NP  ?   ? ?Allergies    ?Patient has no known allergies.   ? ?Review of Systems   ?  Review of Systems  ?All other systems reviewed and are negative. ? ?Physical Exam ?Updated Vital Signs ?BP (!) 152/75   Pulse 69   Temp 98 ?F (36.7 ?C) (Oral)   Resp 18   SpO2 97%  ?Physical Exam ?Vitals and nursing note reviewed.  ?51 year old female, resting comfortably and in no acute distress. Vital signs are significant for elevated blood pressure. Oxygen saturation is 97%, which is normal. ?Head is normocephalic and atraumatic. PERRLA, EOMI. Oropharynx is clear. ?Neck is mildly tender in the left paracervical muscles inferiorly.  No midline tenderness.  There is no adenopathy  or JVD. ?Back is nontender and there is no CVA tenderness. ?Lungs are clear without rales, wheezes, or rhonchi. ?Chest is nontender. ?Heart has regular rate and rhythm without murmur. ?Abdomen is soft, flat, nontender. ?Extremities have no cyanosis or edema, full range of motion is present. ?Skin is warm and dry without rash. ?Neurologic: Mental status is normal, cranial nerves are intact, strength is 5/5 in all 4 extremities, station and gait are normal. ? ?ED Results / Procedures / Treatments   ? ?Radiology ?CT Cervical Spine Wo Contrast ? ?Result Date: 12/30/2021 ?CLINICAL DATA:  Neck pain, acute, no red flags. Worker's comp injury. EXAM: CT CERVICAL SPINE WITHOUT CONTRAST TECHNIQUE: Multidetector CT imaging of the cervical spine was performed without intravenous contrast. Multiplanar CT image reconstructions were also generated. RADIATION DOSE REDUCTION: This exam was performed according to the departmental dose-optimization program which includes automated exposure control, adjustment of the mA and/or kV according to patient size and/or use of iterative reconstruction technique. COMPARISON:  None. FINDINGS: Alignment: Reversal of the usual cervical lordosis. No anterior subluxation. This is likely positional but could indicate muscle spasm. Normal alignment of the facet joints. C1-2 articulation appears intact. Skull base and vertebrae: Skull base appears intact. No vertebral compression deformities. No focal bone lesion or bone destruction. Bone cortex appears intact. Soft tissues and spinal canal: No prevertebral soft tissue swelling. No abnormal paraspinal soft tissue mass or infiltration. Disc levels: Mild degenerative changes at C4-5 and C5-6 levels with disc space narrowing and small endplate osteophyte formation. Upper chest: Lung apices are clear. Diffuse enlargement of the thyroid gland without nodularity. Other: None. IMPRESSION: 1. Nonspecific reversal of the usual cervical lordosis. No acute  displaced fractures identified. 2. Mild degenerative changes. 3. Diffuse enlargement of the thyroid gland. No change since prior chest CT 05/20/2019. Consider follow-up thyroid ultrasound. Electronically Signed   By: Burman Nieves M.D.   On: 12/30/2021 23:45   ? ?Procedures ?Procedures  ? ? ?Medications Ordered in ED ?Medications - No data to display ? ?ED Course/ Medical Decision Making/ A&P ?  ?                        ?Medical Decision Making ? ?Neck injury from shaking without any direct trauma.  She is neurologically intact.  She is sent for CT scan of the cervical spine to rule out fracture and this shows some straightening consistent with muscle spasm, but no fracture.  I have independently viewed the images, and agree with the radiologist's interpretation.  Patient is advised that there does not appear to be serious injury, advised on applying ice and using over-the-counter analgesics as needed for pain.  Referred back to her primary care provider.  There was an incidental finding of enlarged thyroid which is stable compared with prior.  Patient already has a thyroid ultrasound ordered by her endocrinologist. ? ? ? ? ? ? ? ?  Final Clinical Impression(s) / ED Diagnoses ?Final diagnoses:  ?Neck sprain, initial encounter  ?Elevated blood pressure reading with diagnosis of hypertension  ? ? ?Rx / DC Orders ?ED Discharge Orders   ? ? None  ? ?  ? ? ?  ?Dione Booze, MD ?12/31/21 0139 ? ?

## 2021-12-31 NOTE — Discharge Instructions (Addendum)
Apply ice for 30 minutes at a time, 4 times a day. ? ?Take either ibuprofen or naproxen as needed for pain.  To get additional pain relief, add acetaminophen.  Please be aware that if you combine acetaminophen with either ibuprofen or naproxen, you get better pain relief and you get from taking either medication by itself. ? ?Your CT scan did show an enlarged thyroid.  Please make sure to follow through with getting the ultrasound of your thyroid which your endocrinologist has already ordered. ?

## 2022-01-05 ENCOUNTER — Telehealth: Payer: Self-pay | Admitting: *Deleted

## 2022-01-05 ENCOUNTER — Ambulatory Visit (INDEPENDENT_AMBULATORY_CARE_PROVIDER_SITE_OTHER): Payer: Worker's Compensation | Admitting: Family Medicine

## 2022-01-05 ENCOUNTER — Other Ambulatory Visit: Payer: Self-pay

## 2022-01-05 ENCOUNTER — Ambulatory Visit (INDEPENDENT_AMBULATORY_CARE_PROVIDER_SITE_OTHER)
Admission: RE | Admit: 2022-01-05 | Discharge: 2022-01-05 | Disposition: A | Discharge status: Home / Self Care | Payer: Worker's Compensation | Source: Ambulatory Visit | Attending: Family Medicine | Admitting: Family Medicine

## 2022-01-05 ENCOUNTER — Encounter: Payer: Self-pay | Admitting: Family Medicine

## 2022-01-05 VITALS — BP 140/92 | HR 64 | Temp 98.3°F | Ht 64.0 in | Wt 233.6 lb

## 2022-01-05 DIAGNOSIS — S161XXD Strain of muscle, fascia and tendon at neck level, subsequent encounter: Secondary | ICD-10-CM | POA: Diagnosis not present

## 2022-01-05 DIAGNOSIS — M25512 Pain in left shoulder: Secondary | ICD-10-CM

## 2022-01-05 DIAGNOSIS — M542 Cervicalgia: Secondary | ICD-10-CM | POA: Diagnosis not present

## 2022-01-05 MED ORDER — TIZANIDINE HCL 2 MG PO CAPS: 2.0000 mg | ORAL_CAPSULE | Freq: Every evening | ORAL | 1 refills | Status: DC | PRN

## 2022-01-05 NOTE — Telephone Encounter (Signed)
Received message for [facility] requesting PA for tizanidine.  PA completed on CoverMyMeds and sent for review.  Can take up to 72 hours for a decision.  ?

## 2022-01-05 NOTE — Progress Notes (Signed)
? ? ?Amber [name]. Amber Virtue, MD, CAQ Sports Medicine ?Nature conservation officer at [facility] [address] ?[address] [place] ?[place], [address] ? ?Phone: [phone]  FAX: [phone] ? ?Amber [name] - 51 y.o. female  MRN [id number]  Date of Birth: 12/19/1970 ? ?Date: 01/05/2022  PCP: Doreene Nest, NP  Referral: Doreene Nest, NP ? ?Chief Complaint  ?Patient presents with  ?? Shoulder Pain  ?  Left-Hurt on Escalator at work last Thursday.  Seen in ED 3.16.23  ? ? ?This visit occurred during the SARS-CoV-2 public health emergency.  Safety protocols were in place, including screening questions prior to the visit, additional usage of staff PPE, and extensive cleaning of exam room while observing appropriate contact time as indicated for disinfecting solutions.  ? ?Subjective:  ? ?Amber [name] is a 51 y.o. very pleasant female patient with Body mass index is 40.09 kg/m?. who presents with the following: ? ?DOI: 12/30/2021 ? ?This is a very nice young lady who I remember from seeing her before presents after an injury at work. ? ?She describes an incident where she was on an escalator and the stairs were behind her.  At that point the stairs had some type of mechanical problem and were shaking and not operating in the manner which they were designed.  She does show me some pictures and it looked as if the escalator was not properly aligned and in some way shape or form damage. ? ?She reports that while her arms were on the handrails she was jostled about and moved side to side vigorously.  At that time, she did not fall, strike her head, neck, or any of her appendages.  She did have pain right at that time of injury. ? ?This occurred at the escalator in the workplace, at the [employer]. ?She is worked there for many years. ? ?She was driven to the ER, and evaluated there.  They did do a CT scan of her cervical spine, and this was grossly unremarkable, with the exception of some mild degenerative  changes.  Formal read is described below. ? ?At this time her neck does hurt from a posterior aspect, and she describes this pain at a level of 7/10. ? ?At this point, her predominant pain centers on the shoulder and she points to the anterior shoulder.  She describes this pain level at 10/10. ? ?The ER did recommend that she take Tylenol or ibuprofen, and this does seem to help, but after it wears off she is in some significant pain. ? 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 injury. ? ?Medical history is significant for significant coronary disease, status post MI, diabetes, high cholesterol, Graves' disease. ? ?Zanaflex ?PT ? ?ER 12/30/2021 OV ?She has history of hypertension, hyperlipidemia, prediabetes, coronary artery disease, Graves' disease, and was on an escalator when it suddenly stopped and she stated that there was a lot of shaking which has led to pain in her neck and in her left shoulder.  She denies any weakness, numbness, tingling.  She did not fall and denies any direct trauma.  She has been ambulatory. ? ?Review of Systems is noted in the HPI, as appropriate ? ?Patient Active Problem List  ? Diagnosis Date Noted  ?? Snoring 05/19/2021  ?? Persistent cough for 3 weeks or longer 05/19/2021  ?? CHF (congestive heart failure) (HCC)   ?? Angina pectoris (HCC) 02/12/2020  ?? CAD (coronary artery disease), native coronary artery  02/12/2020  ?? Graves disease 06/04/2019  ?? Cardiac shock syndrome (HCC)   ?? Hypoxemia   ?? Hyperthyroidism 05/20/2019  ?? Elevated troponin 05/20/2019  ?? Hypokalemia 05/20/2019  ?? NSTEMI (non-ST elevated myocardial infarction) (HCC) 05/20/2019  ?? Unexplained weight loss 03/27/2019  ?? Chronic right shoulder pain 11/28/2018  ?? Prediabetes 12/05/2016  ?? Encounter for annual general medical examination with abnormal findings in adult 12/04/2015  ?? Hyperlipidemia 12/04/2015  ?? Essential hypertension 08/20/2015  ?? Leukocytosis 01/09/2013   ? ? ?Past Medical History:  ?Diagnosis Date  ?? Acute encephalopathy   ?? Acute pulmonary edema (HCC)   ?? Acute respiratory failure with hypoxia (HCC) 05/20/2019  ?? Anemia   ?? CAD (coronary artery disease)   ?? Community acquired pneumonia   ?? Diabetes mellitus without complication (HCC)   ?? HFrEF (heart failure with reduced ejection fraction) (HCC)   ?? HTN (hypertension)   ?? Hyperthyroidism   ?? Myocardial infarction [facility])   ?? 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 Thyroid storm   ? ? ?Past Surgical History:  ?Procedure Laterality Date  ?? CARDIAC CATHETERIZATION    ?? CESAREAN SECTION    ? x2  ?? COLONOSCOPY N/A 06/24/2021  ? Procedure: COLONOSCOPY;  Surgeon: [name], MD;  Location: [facility] ENDOSCOPY;  Service: Gastroenterology;  Laterality: N/A;  ?? CORONARY STENT INTERVENTION N/A 05/22/2019  ? Procedure: CORONARY STENT INTERVENTION;  Surgeon: Iran Ouch, MD;  Location: [facility] INVASIVE CV LAB;  Service: Cardiovascular;  Laterality: N/A;  ?? MASS EXCISION Right 01/18/2013  ? Procedure: EXCISION right scapular cyst;  Surgeon: Axel Filler, MD;  Location: [facility] ORS;  Service: General;  Laterality: Right;  ?? RIGHT/LEFT HEART CATH AND CORONARY ANGIOGRAPHY N/A 05/22/2019  ? Procedure: RIGHT/LEFT HEART CATH AND CORONARY ANGIOGRAPHY;  Surgeon: Iran Ouch, MD;  Location: [facility] INVASIVE CV LAB;  Service: Cardiovascular;  Laterality: N/A;  ? ? ?Family History  ?Problem Relation Age of Onset  ?? Diabetes Father   ?? Diabetes Mother   ?? Heart disease Mother   ?? Diabetes Sister   ?? Hypertension Sister   ?  ? ?Objective:  ? ?BP (!) 140/92   Pulse 64   Temp 98.3 ?F (36.8 ?C) (Oral)   Ht 5\' 4"  (1.626 m)   Wt 233 lb 9 oz (105.9 kg)   SpO2 96%   BMI 40.09 kg/m?  ? ?GEN: No acute distress; alert,appropriate. ?PULM: Breathing comfortably in no respiratory distress ?PSYCH: Normally interactive.  ? ? ? ?CERVICAL SPINE EXAM ?Range of motion: Flexion, extension, lateral bending, and rotation: She does have a roughly  30% loss of motion in all directions  ?Pain with terminal motion: Yes ?Spinous Processes: NT ?SCM: NT ?Upper paracervical muscles: Tender to palpation ?Upper traps: Tender to palpation, predominantly upper. ?C5-T1 intact, sensation and motor  ? ?Left shoulder: Grossly nontender along the clavicle and at the sternoclavicular joint.  She does have some pain at the [facility] joint. ?No significant tenderness along the bicipital groove. ?She does have painful arc of motion and painful motion particular in abduction and flexion. ?Strength in all upper extremities is 5/5. ?Strength in all shoulder movements is 5/5 and at the elbow, hand and wrist. ?Sensation is intact through the entirety of upper extremities. ? ?She does have pain in abduction and flexion notably.  There is some mild loss of motion, with pain near the terminal endpoints. ? ?She does have pain at the top of the shoulder near the insertion of the supraspinatus. ?Negative sulcus ?[facility]  Kyung Rudd as well as Neer test are positive. ?Jobe test is positive ? ?Laboratory and Imaging Data: ? ?CT Cervical Spine Wo Contrast ? ?Result Date: 12/30/2021 ?CLINICAL DATA:  Neck pain, acute, no red flags. Worker's comp injury. EXAM: CT CERVICAL SPINE WITHOUT CONTRAST TECHNIQUE: Multidetector CT imaging of the cervical spine was performed without intravenous contrast. Multiplanar CT image reconstructions were also generated. RADIATION DOSE REDUCTION: This exam was performed according to the departmental dose-optimization program which includes automated exposure control, adjustment of the mA and/or kV according to patient size and/or use of iterative reconstruction technique. COMPARISON:  None. FINDINGS: Alignment: Reversal of the usual cervical lordosis. No anterior subluxation. This is likely positional but could indicate muscle spasm. Normal alignment of the facet joints. C1-2 articulation appears intact. Skull base and vertebrae: Skull base appears intact. No vertebral  compression deformities. No focal bone lesion or bone destruction. Bone cortex appears intact. Soft tissues and spinal canal: No prevertebral soft tissue swelling. No abnormal paraspinal soft tissue mass or infiltration. Disc leve

## 2022-01-06 ENCOUNTER — Ambulatory Visit
Admission: RE | Admit: 2022-01-06 | Discharge: 2022-01-06 | Disposition: A | Discharge status: Home / Self Care | Payer: Commercial Managed Care - PPO | Source: Ambulatory Visit | Attending: Internal Medicine | Admitting: Internal Medicine

## 2022-01-06 DIAGNOSIS — E0500 Thyrotoxicosis with diffuse goiter without thyrotoxic crisis or storm: Secondary | ICD-10-CM

## 2022-01-07 ENCOUNTER — Encounter: Payer: Self-pay | Admitting: Internal Medicine

## 2022-01-10 MED ORDER — TIZANIDINE HCL 2 MG PO TABS
2.0000 mg | ORAL_TABLET | Freq: Every evening | ORAL | 1 refills | Status: DC | PRN
Start: 2022-01-10 — End: 2022-02-03

## 2022-01-10 NOTE — Telephone Encounter (Signed)
PA was denied.  Insurance prefers tablets.  Per Dr. Patsy Lager okay to send in Rx for Tizanidine 2 mg tablets.  Patient notified of this via telephone.  ?

## 2022-01-10 NOTE — Addendum Note (Signed)
Addended by: Damita Lack on: 01/10/2022 10:13 AM ? ? Modules accepted: Orders ? ?

## 2022-01-11 ENCOUNTER — Telehealth: Payer: Self-pay | Admitting: Primary Care

## 2022-01-11 NOTE — Telephone Encounter (Signed)
Please print and fax this information to ensure this is received by them.  ? ?This is for Workers Comp case - no longer related to referrals. Looks like they need to Ortho/PT notes from the office that we referral them to to close out her WC case - not sure why they are not contacting the referred office?? ? ?Thanks! ? ?

## 2022-01-11 NOTE — Telephone Encounter (Signed)
Amber [name] with [employer] called stating that she need the PT orders and the notes from pt OV on 01/05/22. Amber [name] states that you can fax the papers at [phone]. Please advise. ?

## 2022-01-12 ENCOUNTER — Other Ambulatory Visit: Payer: Self-pay | Admitting: Family Medicine

## 2022-01-13 NOTE — Telephone Encounter (Signed)
F/u scheduled for 01/18/22 ?

## 2022-01-16 ENCOUNTER — Other Ambulatory Visit: Payer: Self-pay | Admitting: Primary Care

## 2022-01-16 DIAGNOSIS — E1165 Type 2 diabetes mellitus with hyperglycemia: Secondary | ICD-10-CM

## 2022-01-18 ENCOUNTER — Ambulatory Visit: Payer: Commercial Managed Care - PPO | Admitting: Primary Care

## 2022-01-20 ENCOUNTER — Ambulatory Visit (INDEPENDENT_AMBULATORY_CARE_PROVIDER_SITE_OTHER): Payer: Commercial Managed Care - PPO | Admitting: Primary Care

## 2022-01-20 ENCOUNTER — Encounter: Payer: Self-pay | Admitting: Primary Care

## 2022-01-20 ENCOUNTER — Other Ambulatory Visit: Payer: Self-pay | Admitting: Cardiovascular Disease

## 2022-01-20 VITALS — BP 142/84 | HR 56 | Ht 64.0 in | Wt 217.8 lb

## 2022-01-20 DIAGNOSIS — E0590 Thyrotoxicosis, unspecified without thyrotoxic crisis or storm: Secondary | ICD-10-CM

## 2022-01-20 DIAGNOSIS — E1165 Type 2 diabetes mellitus with hyperglycemia: Secondary | ICD-10-CM

## 2022-01-20 DIAGNOSIS — R7303 Prediabetes: Secondary | ICD-10-CM | POA: Diagnosis not present

## 2022-01-20 DIAGNOSIS — I10 Essential (primary) hypertension: Secondary | ICD-10-CM

## 2022-01-20 DIAGNOSIS — I509 Heart failure, unspecified: Secondary | ICD-10-CM | POA: Diagnosis not present

## 2022-01-20 DIAGNOSIS — I2510 Atherosclerotic heart disease of native coronary artery without angina pectoris: Secondary | ICD-10-CM | POA: Diagnosis not present

## 2022-01-20 DIAGNOSIS — E785 Hyperlipidemia, unspecified: Secondary | ICD-10-CM

## 2022-01-20 LAB — POCT GLYCOSYLATED HEMOGLOBIN (HGB A1C): Hemoglobin A1C: 6.2 % — AB (ref 4.0–5.6)

## 2022-01-20 NOTE — Assessment & Plan Note (Signed)
Following with endocrinology, office notes from January 2023 reviewed.  Labs from 2023 reviewed. ? ?Continue methimazole 7.5 mg daily. ? ?Asymptomatic. ?

## 2022-01-20 NOTE — Assessment & Plan Note (Signed)
Above goal today. ? ?Again discussed to start monitoring blood pressures at home. ? ?Consider dose adjustment of losartan to 100 mg.  Following with cardiology. ? ?Continue losartan 50 mg daily, spironolactone 12.5 mg daily.  ?

## 2022-01-20 NOTE — Assessment & Plan Note (Signed)
New diagnosis as of last visit. ? ?A1c today improved and is at 6.2. ? ?Continue metformin XR 500 mg daily. ?Managed on statin and ARB. ? ?Eye exam up-to-date. ?Pneumonia vaccine up-to-date. ? ?Follow-up in 6 months ? ? ?

## 2022-01-20 NOTE — Patient Instructions (Signed)
It was a pleasure to see you today!   

## 2022-01-20 NOTE — Assessment & Plan Note (Signed)
Appears euvolemic. ? ?Continue metoprolol succinate 25 mg daily, spironolactone 12.5 mg daily, losartan 50 mg daily, isosorbide mononitrate 15 mg daily. ? ?Following with cardiology, office notes from January 2023 reviewed. ?

## 2022-01-20 NOTE — Progress Notes (Signed)
? ?Subjective:  ? ? Patient ID: Amber [name], female    DOB: 07-29-1971, 51 y.o.   MRN: [id number] ? ?HPI ? ?Amber [name] is a very pleasant 51 y.o. female with a history of hypertension, CAD with NSTEMI, CHF, hypothyroidism, hyperlipidemia, type 2 diabetes who presents today for follow up of chronic conditions. ? ?1) Type 2 Diabetes: ? ?Current medications include: Metformin XR 500 mg daily.  ? ?Last A1C: 7.0 in August 2022, 6.2 today ?Last Eye Exam: UTD ? ?Pneumonia Vaccination: 2020 ?Urine Microalbumin: None.  Managed on ARB. ?Statin: Atorvastatin ? ?2) Essential Hypertension/CAD/CHF: Currently managed on losartan 50 mg daily, spironolactone 12.5 mg daily, metoprolol succinate 25 mg daily, isosorbide mononitrate 30 mg daily, furosemide 20 mg daily, atorvastatin 80 mg daily, Zetia 10 mg daily. ? ?Following with cardiology.  Last office visit was in January 2023.  No changes made to her medication regimen, however PCSK9 inhibitor is considered given hyperlipidemia despite max doses of atorvastatin and Zetia. ? ?She denies chest pain, shortness of breath, headaches, dizziness. ? ?BP Readings from Last 3 Encounters:  ?01/20/22 (!) 142/84  ?01/05/22 (!) 140/92  ?12/31/21 (!) 158/75  ? ?3) Hyperthyroidism: Currently managed on methimazole 7.5 milligrams daily.  Following with endocrinology.  Last office visit was in January 2023.  Free T4 with level of 1.12.  No changes made to medication regimen. ? ?Today she denies palpitations, chest pain. ? ? ? ?Review of Systems  ?Respiratory:  Negative for shortness of breath.   ?Cardiovascular:  Negative for chest pain.  ?Neurological:  Negative for dizziness, numbness and headaches.  ? ?   ? ? ?Past Medical History:  ?Diagnosis Date  ? Acute encephalopathy   ? Acute pulmonary edema (HCC)   ? Acute respiratory failure with hypoxia (HCC) 05/20/2019  ? Anemia   ? CAD (coronary artery disease)   ? Community acquired pneumonia   ? Diabetes mellitus without complication (HCC)   ?  HFrEF (heart failure with reduced ejection fraction) (HCC)   ? HTN (hypertension)   ? Hyperthyroidism   ? Myocardial infarction [facility])   ? 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 Thyroid storm   ? ? ?Social History  ? ?Socioeconomic History  ? Marital status: Married  ?  Spouse name: Not on file  ? Number of children: 2  ? Years of education: Not on file  ? Highest education level: Not on file  ?Occupational History  ?  Comment: front desk   ?Tobacco Use  ? Smoking status: Never  ? Smokeless tobacco: Never  ?Vaping Use  ? Vaping Use: Never used  ?Substance and Sexual Activity  ? Alcohol use: Yes  ?  Alcohol/week: 0.0 standard drinks  ?  Comment: rare  ? Drug use: No  ? Sexual activity: Not Currently  ?Other Topics Concern  ? Not on file  ?Social History Narrative  ? From [place].  ? Married.  ? 2 children.  ? Works at [employer] as a Production designer, theatre/television/film  ? Enjoys dancing, spending time with family.  ? ?Social Determinants of Health  ? ?Financial Resource Strain: Not on file  ?Food Insecurity: Not on file  ?Transportation Needs: Not on file  ?Physical Activity: Not on file  ?Stress: Not on file  ?Social Connections: Not on file  ?Intimate Partner Violence: Not on file  ? ? ?Past Surgical History:  ?Procedure Laterality Date  ? CARDIAC CATHETERIZATION    ? CESAREAN SECTION    ? x2  ? COLONOSCOPY N/A  06/24/2021  ? Procedure: COLONOSCOPY;  Surgeon: [name], MD;  Location: [facility] ENDOSCOPY;  Service: Gastroenterology;  Laterality: N/A;  ? CORONARY STENT INTERVENTION N/A 05/22/2019  ? Procedure: CORONARY STENT INTERVENTION;  Surgeon: Iran Ouch, MD;  Location: [facility] INVASIVE CV LAB;  Service: Cardiovascular;  Laterality: N/A;  ? MASS EXCISION Right 01/18/2013  ? Procedure: EXCISION right scapular cyst;  Surgeon: Axel Filler, MD;  Location: [facility] ORS;  Service: General;  Laterality: Right;  ? RIGHT/LEFT HEART CATH AND CORONARY ANGIOGRAPHY N/A 05/22/2019  ? Procedure: RIGHT/LEFT HEART CATH AND CORONARY ANGIOGRAPHY;   Surgeon: Iran Ouch, MD;  Location: [facility] INVASIVE CV LAB;  Service: Cardiovascular;  Laterality: N/A;  ? ? ?Family History  ?Problem Relation Age of Onset  ? Diabetes Father   ? Diabetes Mother   ? Heart disease Mother   ? Diabetes Sister   ? Hypertension Sister   ? ? ?No Known Allergies ? ?Current Outpatient Medications on File Prior to Visit  ?Medication Sig Dispense Refill  ? aspirin 81 MG chewable tablet Chew 1 tablet (81 mg total) by mouth daily. 30 tablet 11  ? atorvastatin (LIPITOR) 80 MG tablet TAKE 1 TABLET BY MOUTH DAILY AT 6 PM. 90 tablet 0  ? calcium-vitamin D 250-100 MG-UNIT tablet Take 1 tablet by mouth daily.    ? diphenhydrAMINE (BENADRYL) 25 MG tablet Take 25 mg by mouth every 6 (six) hours as needed for allergies.    ? ezetimibe (ZETIA) 10 MG tablet TAKE 1 TABLET BY MOUTH EVERY DAY 30 tablet 6  ? ferrous sulfate 325 (65 FE) MG tablet Take 325 mg by mouth 3 (three) times a week. No set days    ? furosemide (LASIX) 20 MG tablet TAKE 1 TABLET BY MOUTH EVERY DAY 30 tablet 6  ? isosorbide mononitrate (IMDUR) 30 MG 24 hr tablet TAKE 1/2 TABLET BY MOUTH DAILY 45 tablet 2  ? metFORMIN (GLUCOPHAGE-XR) 500 MG 24 hr tablet TAKE 1 TAB DAILY WITH BREAKFAST FOR DIABETES NEED OFFICE VISIT FOR REFILLS 30 tablet 0  ? methimazole (TAPAZOLE) 5 MG tablet Take 1.5 tablets (7.5 mg total) by mouth daily. 45 tablet 6  ? metoprolol succinate (TOPROL-XL) 25 MG 24 hr tablet TAKE 1 TABLET BY MOUTH EVERY DAY 30 tablet 11  ? potassium chloride SA (K-DUR) 20 MEQ tablet Take 1 tablet (20 mEq total) by mouth daily. 90 tablet 3  ? spironolactone (ALDACTONE) 25 MG tablet TAKE 1/2 TABLET BY MOUTH EVERY DAY 15 tablet 4  ? tiZANidine (ZANAFLEX) 2 MG tablet Take 1 tablet (2 mg total) by mouth at bedtime as needed for muscle spasms. 30 tablet 1  ? nitroGLYCERIN (NITROSTAT) 0.4 MG SL tablet Place 1 tablet (0.4 mg total) under the tongue every 5 (five) minutes as needed for chest pain. 25 tablet 1  ? ?No current facility-administered  medications on file prior to visit.  ? ? ?BP (!) 142/84   Pulse (!) 56   Ht 5\' 4"  (1.626 m)   Wt 217 lb 12.8 oz (98.8 kg)   LMP 05/19/2021 (Approximate)   SpO2 96%   BMI 37.39 kg/m?  ?Objective:  ? Physical Exam ?Cardiovascular:  ?   Rate and Rhythm: Normal rate and regular rhythm.  ?Pulmonary:  ?   Effort: Pulmonary effort is normal.  ?   Breath sounds: Normal breath sounds.  ?Musculoskeletal:  ?   Cervical back: Neck supple.  ?Skin: ?   General: Skin is warm and dry.  ?Psychiatric:     ?  Mood and Affect: Mood normal.  ? ? ? ? ? ?   ?Assessment & Plan:  ? ? ? ? ?This visit occurred during the SARS-CoV-2 public health emergency.  Safety protocols were in place, including screening questions prior to the visit, additional usage of staff PPE, and extensive cleaning of exam room while observing appropriate contact time as indicated for disinfecting solutions.  ?

## 2022-01-20 NOTE — Assessment & Plan Note (Signed)
Asymptomatic. ? ?Continue metoprolol succinate 25 mg daily, spironolactone 12.5 mg daily, losartan 50 mg daily, isosorbide mononitrate 15 mg daily. ? ?Following with cardiology, office notes from January 2023 reviewed. ?

## 2022-01-20 NOTE — Assessment & Plan Note (Signed)
Lipid panel from August 2022 reviewed. ? ?Continue atorvastatin 80 mg daily, Zetia 10 mg daily. ? ?PCSK9 inhibitor is under consideration. ?Following with cardiology. ?

## 2022-01-21 ENCOUNTER — Ambulatory Visit: Payer: Commercial Managed Care - PPO | Attending: Family Medicine | Admitting: Physical Therapy

## 2022-01-26 NOTE — Telephone Encounter (Signed)
Faxed

## 2022-02-01 ENCOUNTER — Telehealth: Payer: Self-pay | Admitting: Primary Care

## 2022-02-01 NOTE — Telephone Encounter (Signed)
PT order and Dr. Cyndie Chime last office note faxed to [facility] as requested.  ?

## 2022-02-01 NOTE — Telephone Encounter (Signed)
Amber [name] with [employer] called stating that she needs pt OV Medical Records and PT Orders faxed to [phone]. Please advise. ?

## 2022-02-01 NOTE — Telephone Encounter (Signed)
PT ordered by [facility]  ?

## 2022-02-03 ENCOUNTER — Ambulatory Visit: Payer: Commercial Managed Care - PPO | Admitting: Primary Care

## 2022-02-03 ENCOUNTER — Other Ambulatory Visit: Payer: Self-pay | Admitting: Family Medicine

## 2022-02-03 NOTE — Telephone Encounter (Signed)
Last office visit 01/20/22 for elevated blood sugar and refills with K. Chestine Spore. Last refilled 01/10/2022 for #30 with 1 refill by Dr. Patsy Lager.  Next Appt: 08/05/22 for glucose follow up.  ?

## 2022-02-11 ENCOUNTER — Other Ambulatory Visit: Payer: Self-pay | Admitting: Primary Care

## 2022-02-11 DIAGNOSIS — E1165 Type 2 diabetes mellitus with hyperglycemia: Secondary | ICD-10-CM

## 2022-02-14 NOTE — Telephone Encounter (Signed)
Attempted to schedule.  LMOV to call office.  ° °

## 2022-02-17 ENCOUNTER — Other Ambulatory Visit: Payer: Self-pay | Admitting: Family Medicine

## 2022-02-17 NOTE — Telephone Encounter (Signed)
Usually not, let's make sure that she has follow-up in the next couple of weeks. ?

## 2022-02-17 NOTE — Telephone Encounter (Signed)
Left message for Amber [name] to call the office and schedule follow up,  for her neck and shoulder,  with Dr. Lorelei Pont in the next couple of weeks.  ?

## 2022-02-17 NOTE — Telephone Encounter (Signed)
Pharmacy is requesting 90 day supply.  Is this appropriate?   

## 2022-02-23 NOTE — Telephone Encounter (Signed)
L mom to schedule 

## 2022-04-28 ENCOUNTER — Encounter: Payer: Self-pay | Admitting: Internal Medicine

## 2022-04-28 ENCOUNTER — Ambulatory Visit: Payer: Commercial Managed Care - PPO | Admitting: Internal Medicine

## 2022-04-28 VITALS — BP 140/76 | HR 70 | Ht 64.0 in | Wt 234.0 lb

## 2022-04-28 DIAGNOSIS — E0500 Thyrotoxicosis with diffuse goiter without thyrotoxic crisis or storm: Secondary | ICD-10-CM | POA: Diagnosis not present

## 2022-04-28 DIAGNOSIS — E0590 Thyrotoxicosis, unspecified without thyrotoxic crisis or storm: Secondary | ICD-10-CM

## 2022-04-28 LAB — T4, FREE: Free T4: 2.68 ng/dL — ABNORMAL HIGH (ref 0.60–1.60)

## 2022-04-28 LAB — TSH: TSH: 0.02 u[IU]/mL — ABNORMAL LOW (ref 0.35–5.50)

## 2022-04-28 MED ORDER — METHIMAZOLE 5 MG PO TABS: 7.5000 mg | ORAL_TABLET | Freq: Three times a day (TID) | ORAL | 2 refills | Status: DC

## 2022-04-28 NOTE — Progress Notes (Signed)
Name: Amber [name]  MRN/ DOB: [id number], 19-Jul-1971    Age/ Sex: 51 y.o., female     PCP: [name], NP   Reason for Endocrinology Evaluation: Hyperthytroidism     Initial Endocrinology Clinic Visit: 04/03/2019    PATIENT IDENTIFIER: Amber [name] is a 51 y.o., female with a past medical history of HTN and CAD (S/P PCI 05/2019).She has followed with [facility] since 04/03/2019 for consultative assistance with management of her Hyperthyrodism  HISTORICAL SUMMARY: The patient was first diagnosed with hyperthyroidism in 03/2019, with a suppressed TSH at < 0.01 uIU/mL with elevated FT4 5.96 ng/dL. She did have weight loss  For ~ 4 months prior to her presentation.  Methimazole started in 03/2019  SUBJECTIVE:     Today (04/28/2022):  [name] is here for a follow up on hyperthyroidism and Graves' disease.   3 nights ago she had an episode where she felt short of breath, with palpitations and dry cough.  This lasted 1 night and it has resolved since then except for mild shortness of breath but no coughing or palpitations, she has a pending appointment with cardiology   She has been noted with weight  gain  Denies fever or abdominal pain Denies constipation or diarrhea  No burring or itching in the eyes Denies palpitations  Has occasional local neck swelling   Last eye exam 02/2022  Methimazole 5 mg, 1.5 tabs  daily     HISTORY:  Past Medical History:  Past Medical History:  Diagnosis Date   Acute encephalopathy    Acute pulmonary edema ([place])    Acute respiratory failure with hypoxia ([place]) 05/20/2019   Anemia    CAD (coronary artery disease)    Community acquired pneumonia    Diabetes mellitus without complication ([place])    HFrEF (heart failure with reduced ejection fraction) ([place])    HTN (hypertension)    Hyperthyroidism    Myocardial infarction ([place])    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   Thyroid storm    Past Surgical History:  Past  Surgical History:  Procedure Laterality Date   CARDIAC CATHETERIZATION     CESAREAN SECTION     x2   COLONOSCOPY N/A 06/24/2021   Procedure: COLONOSCOPY;  Surgeon: Jonathon Bellows, MD;  Location: [facility] ENDOSCOPY;  Service: Gastroenterology;  Laterality: N/A;   CORONARY STENT INTERVENTION N/A 05/22/2019   Procedure: CORONARY STENT INTERVENTION;  Surgeon: Wellington Hampshire, MD;  Location: [place] CV LAB;  Service: Cardiovascular;  Laterality: N/A;   MASS EXCISION Right 01/18/2013   Procedure: EXCISION right scapular cyst;  Surgeon: Ralene Ok, MD;  Location: [facility] ORS;  Service: General;  Laterality: Right;   RIGHT/LEFT HEART CATH AND CORONARY ANGIOGRAPHY N/A 05/22/2019   Procedure: RIGHT/LEFT HEART CATH AND CORONARY ANGIOGRAPHY;  Surgeon: Wellington Hampshire, MD;  Location: [facility] CV LAB;  Service: Cardiovascular;  Laterality: N/A;   Social History:  reports that she has never smoked. She has never used smokeless tobacco. She reports current alcohol use. She reports that she does not use drugs. Family History:  Family History  Problem Relation Age of Onset   Diabetes Father    Diabetes Mother    Heart disease Mother    Diabetes Sister    Hypertension Sister      HOME MEDICATIONS: Allergies as of 04/28/2022   No Known Allergies      Medication List        Accurate as of April 28, 2022  7:58  AM. If you have any questions, ask your nurse or doctor.          aspirin 81 MG chewable tablet Chew 1 tablet (81 mg total) by mouth daily.   atorvastatin 80 MG tablet Commonly known as: LIPITOR TAKE 1 TABLET BY MOUTH DAILY AT 6 PM.   calcium-vitamin D 250-100 MG-UNIT tablet Take 1 tablet by mouth daily.   diphenhydrAMINE 25 MG tablet Commonly known as: BENADRYL Take 25 mg by mouth every 6 (six) hours as needed for allergies.   ezetimibe 10 MG tablet Commonly known as: ZETIA TAKE 1 TABLET BY MOUTH EVERY DAY   ferrous sulfate 325 (65 FE) MG tablet Take 325 mg by mouth 3 (three)  times a week. No set days   furosemide 20 MG tablet Commonly known as: LASIX TAKE 1 TABLET BY MOUTH EVERY DAY   isosorbide mononitrate 30 MG 24 hr tablet Commonly known as: IMDUR TAKE 1/2 TABLET BY MOUTH DAILY   losartan 50 MG tablet Commonly known as: COZAAR TAKE 1 TABLET (50 MG TOTAL) BY MOUTH DAILY. PLEASE CALL OFFICE TO SCHEDULE FOLLOW UP APPOINTMENT.   metFORMIN 500 MG 24 hr tablet Commonly known as: GLUCOPHAGE-XR Take 1 tablet (500 mg total) by mouth daily with breakfast.   methimazole 5 MG tablet Commonly known as: TAPAZOLE Take 1.5 tablets (7.5 mg total) by mouth daily.   metoprolol succinate 25 MG 24 hr tablet Commonly known as: TOPROL-XL TAKE 1 TABLET BY MOUTH EVERY DAY   nitroGLYCERIN 0.4 MG SL tablet Commonly known as: Nitrostat Place 1 tablet (0.4 mg total) under the tongue every 5 (five) minutes as needed for chest pain.   spironolactone 25 MG tablet Commonly known as: ALDACTONE TAKE 1/2 TABLET BY MOUTH EVERY DAY   tiZANidine 2 MG tablet Commonly known as: ZANAFLEX TAKE 1 TABLET BY MOUTH AT BEDTIME AS NEEDED FOR MUSCLE SPASMS.          OBJECTIVE:   PHYSICAL EXAM: VS: LMP 05/19/2021 (Approximate)    EXAM: General: Pt appears well and is in NAD  Neck: General: Supple without adenopathy. Thyroid: Thyroid size is prominent.  No nodules appreciated.   Lungs: Clear with good BS bilat with no rales, rhonchi, or wheezes  Heart: Auscultation: RRR.  Extremities:  BL LE: No pretibial edema normal ROM and strength.  Mental Status: Mood and affect: No depression, anxiety, or agitation     DATA REVIEWED:   Latest Reference Range & Units 10/27/21 15:39  TSH 0.35 - 5.50 uIU/mL <0.01 (L)  T4,Free(Direct) 0.60 - 1.60 ng/dL 1.12      Latest Reference Range & Units Most Recent  Sodium 135 - 145 mEq/L 141 05/19/21 11:11  Potassium 3.5 - 5.1 mEq/L 3.8 05/19/21 11:11  Chloride 96 - 112 mEq/L 104 05/19/21 11:11  CO2 19 - 32 mEq/L 27 05/19/21 11:11  Glucose  70 - 99 mg/dL 120 (H) 05/19/21 11:11  BUN 6 - 23 mg/dL 13 05/19/21 11:11  Creatinine 0.40 - 1.20 mg/dL 0.75 05/19/21 11:11  Calcium 8.4 - 10.5 mg/dL 9.3 05/19/21 11:11  Anion gap 5 - 15  8 02/12/20 04:13  BUN/Creatinine Ratio 9 - 23  18 01/05/21 15:04  eGFR >59 mL/min/1.73 109 01/05/21 15:04  Calcium Ionized 1.15 - 1.40 mmol/L 1.15 - 1.40 mmol/L 1.22 05/22/19 09:38 1.25 05/22/19 09:38  Phosphorus 2.5 - 4.6 mg/dL 4.9 (H) 05/24/19 04:31  Magnesium 1.7 - 2.4 mg/dL 2.0 05/24/19 04:31  Alkaline Phosphatase 39 - 117 U/L 71 05/19/21 11:11  Albumin 3.5 -  5.2 g/dL 4.1 05/19/21 11:11  Albumin/Globulin Ratio 1.2 - 2.2  1.5 01/05/21 15:04  AST 0 - 37 U/L 13 05/19/21 11:11  ALT 0 - 35 U/L 11 05/19/21 11:11  Total Protein 6.0 - 8.3 g/dL 7.4 05/19/21 11:11  Bilirubin, Direct 0.0 - 0.2 mg/dL <0.1 12/09/19 14:20  Indirect Bilirubin 0.3 - 0.9 mg/dL NOT CALCULATED 12/09/19 14:20  Total Bilirubin 0.2 - 1.2 mg/dL 0.3 05/19/21 11:11  GFR >60.00 mL/min 92.51 05/19/21 11:11  GFR, Est Non African American >60 mL/min >60 02/12/20 04:13  GFR, Est African American >60 mL/min >60 02/12/20 04:13    Latest Reference Range & Units Most Recent  WBC 4.0 - 10.5 K/uL 12.2 (H) 05/20/21 09:16  RBC 3.87 - 5.11 Mil/uL 4.60 05/20/21 09:16  Hemoglobin 12.0 - 15.0 g/dL 12.3 05/20/21 09:16  HCT 36.0 - 46.0 % 36.3 05/20/21 09:16  MCV 78.0 - 100.0 fl 78.8 05/20/21 09:16  MCH 26.0 - 34.0 pg 27.6 02/13/20 03:19  MCHC 30.0 - 36.0 g/dL 33.8 05/20/21 09:16  RDW 11.5 - 15.5 % 15.0 05/20/21 09:16  Platelets 150.0 - 400.0 K/uL 426.0 (H) 05/20/21 09:16  MPV 7.5 - 12.5 fL 12.5 03/28/19 15:16  nRBC 0.0 - 0.2 % 0.0 02/13/20 03:19  Neutrophils 43.0 - 77.0 % 55.0 05/20/21 09:16  NEUT% 38.4 - 76.8 % 51.1 02/12/14 08:08  Lymphocytes 12.0 - 46.0 % 35.7 05/20/21 09:16  LYMPH% 14.0 - 49.7 % 38.2 02/12/14 08:08  Monocytes Relative 3.0 - 12.0 % 6.9 05/20/21 09:16  MONO% 0.0 - 14.0 % 8.5 02/12/14 08:08  Eosinophil 0.0 - 5.0 % 1.3 05/20/21 09:16  EOS% 0.0 - 7.0 %  1.4 02/12/14 08:08  Basophil 0.0 - 3.0 % 1.1 05/20/21 09:16  BASO% 0.0 - 2.0 % 0.8 02/12/14 08:08  Immature Granulocytes % 0 02/12/20 04:13  NEUT# 1.4 - 7.7 K/uL 6.7 05/20/21 09:16  Lymphocyte # 0.7 - 4.0 K/uL 4.4 (H) 05/20/21 09:16  lymph# 0.9 - 3.3 10e3/uL 4.4 (H) 02/12/14 08:08  Total Lymphocyte % 34.9 03/28/19 15:16  Monocyte # 0.1 - 1.0 K/uL 0.8 05/20/21 09:16  MONO# 0.1 - 0.9 10e3/uL 1.0 (H) 02/12/14 08:08  Eosinophils Absolute 0.0 - 0.7 K/uL 0.2 05/20/21 09:16  Basophils Absolute 0.0 - 0.1 K/uL 0.1 05/20/21 09:16  Abs Immature Granulocytes 0.00 - 0.07 K/uL 0.07 02/12/20 04:13  PATHOLOGIST SMEAR REVIEW  Rpt 03/28/19 15:16  RBC Comments Within Normal Limits  Within Normal Limits 01/10/13 11:59  White Cell Comments  Variant Lymphs 01/10/13 11:59  Smear Result  Smear Available 01/10/13 11:59  PLT EST Adequate  Adequate 01/10/13 11:59  Absolute Monocytes 200 - 950 cells/uL 1,062 (H) 03/28/19 15:16     Thyroid ultrasound 01/06/2022  Estimated total number of nodules >/= 1 cm: 0   Number of spongiform nodules >/=  2 cm not described below (TR1): 0   Number of mixed cystic and solid nodules >/= 1.5 cm not described below ([place]): 0   _________________________________________________________   Marked thyroid heterogeneity and mild diffuse enlargement. Background pseudo nodularity noted. No hypervascularity. No discrete nodule or focal abnormality. No regional adenopathy.   IMPRESSION: Heterogeneous mildly enlarged thyroid compatible with chronic medical thyroid disease.   Negative for nodule.    ASSESSMENT / PLAN / RECOMMENDATIONS:   Hyperthyroidism Secondary to Graves' Disease:  - Pt is clinically hypERthyroid, I am perplexed about her unexpected hyperthyroid status, as she has been stable on methimazole for a long time but now and despite going up on the methimazole dose  she continues to have worsening TFTs -I am going to increase methimazole as below -Thyroid ultrasound in  2023 was unrevealing - We had discussed options of RAI ablation vs surgery vs continuing on Methimazole in the past, but given cardiovascular history we opted to stay on methimazole    Medications  Increase methimazole 5 mg to 3 tabs daily   2. Graves' Disease:   - No extrathyroidal manifestations of graves' disease.    Follow-up in 6 months  Labs Wednesday, August 23 at 9:30 AM  Addendum: Discussed lab results with the patient on 04/28/2022 at 1600  Signed electronically by: Mack Guise, MD  [facility]  [facility] [address] [place], [place] [address] Phone: [phone] FAX: [phone]      CC: [name], NP [place] [address] Phone: [phone]  Fax: [phone]   Return to Endocrinology clinic as below: Future Appointments  Date Time Provider [place]  04/28/2022 11:30 AM Vonetta Foulk, Melanie Crazier, MD [facility] None  08/05/2022 11:00 AM [name], NP [facility]

## 2022-04-29 LAB — T3: T3, Total: 218 ng/dL — ABNORMAL HIGH (ref 76–181)

## 2022-05-02 ENCOUNTER — Other Ambulatory Visit: Payer: Self-pay | Admitting: Cardiovascular Disease

## 2022-05-04 ENCOUNTER — Ambulatory Visit: Payer: Commercial Managed Care - PPO | Admitting: Medical

## 2022-05-04 ENCOUNTER — Other Ambulatory Visit
Admission: RE | Admit: 2022-05-04 | Discharge: 2022-05-04 | Disposition: A | Discharge status: Home / Self Care | Payer: Commercial Managed Care - PPO | Attending: Medical | Admitting: Medical

## 2022-05-04 ENCOUNTER — Encounter: Payer: Self-pay | Admitting: Medical

## 2022-05-04 VITALS — BP 138/76 | HR 80 | Ht 64.0 in | Wt 238.0 lb

## 2022-05-04 DIAGNOSIS — E079 Disorder of thyroid, unspecified: Secondary | ICD-10-CM

## 2022-05-04 DIAGNOSIS — G4730 Sleep apnea, unspecified: Secondary | ICD-10-CM

## 2022-05-04 DIAGNOSIS — R011 Cardiac murmur, unspecified: Secondary | ICD-10-CM

## 2022-05-04 DIAGNOSIS — I10 Essential (primary) hypertension: Secondary | ICD-10-CM

## 2022-05-04 DIAGNOSIS — R0609 Other forms of dyspnea: Secondary | ICD-10-CM | POA: Diagnosis present

## 2022-05-04 DIAGNOSIS — I2510 Atherosclerotic heart disease of native coronary artery without angina pectoris: Secondary | ICD-10-CM

## 2022-05-04 DIAGNOSIS — E782 Mixed hyperlipidemia: Secondary | ICD-10-CM

## 2022-05-04 DIAGNOSIS — I5022 Chronic systolic (congestive) heart failure: Secondary | ICD-10-CM | POA: Diagnosis not present

## 2022-05-04 LAB — BASIC METABOLIC PANEL
Anion gap: 7 (ref 5–15)
BUN: 14 mg/dL (ref 6–20)
CO2: 25 mmol/L (ref 22–32)
Calcium: 9 mg/dL (ref 8.9–10.3)
Chloride: 109 mmol/L (ref 98–111)
Creatinine, Ser: 0.56 mg/dL (ref 0.44–1.00)
GFR, Estimated: >60 mL/min (ref >60)
Glucose, Bld: 148 mg/dL — ABNORMAL HIGH (ref 70–99)
Potassium: 3.7 mmol/L (ref 3.5–5.1)
Sodium: 141 mmol/L (ref 135–145)

## 2022-05-04 LAB — CBC
HCT: 36.7 % (ref 36.0–46.0)
Hemoglobin: 12.6 g/dL (ref 12.0–15.0)
MCH: 25.1 pg — ABNORMAL LOW (ref 26.0–34.0)
MCHC: 34.3 g/dL (ref 30.0–36.0)
MCV: 73.1 fL — ABNORMAL LOW (ref 80.0–100.0)
Platelets: 313 10*3/uL (ref 150–400)
RBC: 5.02 MIL/uL (ref 3.87–5.11)
RDW: 14.2 % (ref 11.5–15.5)
WBC: 10.6 10*3/uL — ABNORMAL HIGH (ref 4.0–10.5)
nRBC: 0 % (ref 0.0–0.2)

## 2022-05-04 NOTE — Patient Instructions (Addendum)
Medication Instructions:  Your physician recommends that you continue on your current medications as directed. Please refer to the Current Medication list given to you today.  *If you need a refill on your cardiac medications before your next appointment, please call your pharmacy*  Lab Work: Your physician recommends that you have lab work BMP, CBC today at: Sales executive at [facility] 1st desk on the right to check in (REGISTRATION)  Lab hours: Monday- Friday (7:30 am- 5:30 pm)  If you have labs (blood work) drawn today and your tests are completely normal, you will receive your results only by: MyChart Message (if you have MyChart) OR A paper copy in the mail If you have any lab test that is abnormal or we need to change your treatment, we will call you to review the results.  Testing/Procedures: Your physician has requested that you have an echocardiogram. Echocardiography is a painless test that uses sound waves to create images of your heart. It provides your doctor with information about the size and shape of your heart and how well your heart's chambers and valves are working. This procedure takes approximately one hour. There are no restrictions for this procedure.  Follow-Up: At [facility], you and your health needs are our priority.  As part of our continuing mission to provide you with exceptional heart care, we have created designated Provider Care Teams.  These Care Teams include your primary Cardiologist (physician) and Advanced Practice Providers (APPs -  Physician Assistants and Nurse Practitioners) who all work together to provide you with the care you need, when you need it.  We recommend signing up for the patient portal called "MyChart".  Sign up information is provided on this After Visit Summary.  MyChart is used to connect with patients for Virtual Visits (Telemedicine).  Patients are able to view lab/test results, encounter notes, upcoming appointments, etc.   Non-urgent messages can be sent to your provider as well.   To learn more about what you can do with MyChart, go to [email or web address].    Your next appointment:   6-8 week(s)  The format for your next appointment:   In Person  Provider:   You may see Lorine Bears, MD or one of the following Advanced Practice Providers on your designated Care Team:   Nicolasa Ducking, NP Eula Listen, PA-C Cadence Fransico Michael, [place]    Other Instructions You have been referred to Pulmonology   Important Information About Sugar

## 2022-05-04 NOTE — Progress Notes (Signed)
Cardiology Office Note:    Date:  05/04/2022   ID:  Amber [name], DOB September 26, 1971, MRN [id number]  PCP:  Doreene Nest, NP  [facility] Cardiologist:  Lorine Bears, MD  [facility] Electrophysiologist:  None   Referring MD: Doreene Nest, NP   Chief Complaint: 6 month follow-up/SOB  History of Present Illness:    Amber [name] is a 51 y.o. female with a hx of HTN, CAD s/p PCI DES mLAD 05/2019, HFrEF (mixed NICM/ICM with improved EF), hypothyroidism with previous thyroid storm, anemia, obesity who presents for 6 month follow-up and shortness of breath.   She was hospitazlied in august 2020 with severe hyperthryroidusm. She presented with acute respiratory failure requiring mechanical ventilation. He had NSTEMI and pulmonary edema. Echo showed LVEF 25-30% with WMA suggestive of stress induced CM vs anterior infarct. She underwent right and left cardiac cath and was found to have 2V CAD with 80% stenosis in the mLAD, occluded OM branches and moderate RCA disease. PCI and DES placement was done to the mid LAD. Heart failure medications were optimized and hypothyroidism was treated. Repeat echo in October showed LVEF 40-45%.   Hospitalized in April 2021 with atypical chest pain and ruled out for MI. Imdur was added. She underwent outpatient [facility] which showed evidence of prior anterior infarct without ischemia. Echo showed LVEF 50-55% with no significant valvular abnormalities.  Last seen 1/223 and was doing well, on monotherapy with Aspirin.   Today, the patient reports worsening shortness of breath, a week ago she was laying down and had sudden onset shortness of breath. Breathing did not seem to improve, so she went to sleep in the chair. Since them she feels shortness of breath on exertion. No chest pain. No lower leg edema. Positive for orthopnea. She works as a Production designer, theatre/television/film, she is on her feet a lot. She takes lasix 20mg  daily. She tries to follow low salt diet. She  reports she has OSA, but does not use CPAP.   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   Thyroid storm     Past Surgical History:  Procedure Laterality Date   CARDIAC CATHETERIZATION     CESAREAN SECTION     x2   COLONOSCOPY N/A 06/24/2021   Procedure: COLONOSCOPY;  Surgeon: 08/24/2021, MD;  [facility]: [facility] ENDOSCOPY;  Service: Gastroenterology;  Laterality: N/A;   CORONARY STENT INTERVENTION N/A 05/22/2019   Procedure: CORONARY STENT INTERVENTION;  Surgeon: 07/22/2019, MD;  Location: [facility] INVASIVE CV LAB;  Service: Cardiovascular;  Laterality: N/A;   MASS EXCISION Right 01/18/2013   Procedure: EXCISION right scapular cyst;  Surgeon: 03/20/2013, MD;  Location: [facility] ORS;  Service: General;  Laterality: Right;   RIGHT/LEFT HEART CATH AND CORONARY ANGIOGRAPHY N/A 05/22/2019   Procedure: RIGHT/LEFT HEART CATH AND CORONARY ANGIOGRAPHY;  Surgeon: 07/22/2019, MD;  Location: [facility] INVASIVE CV LAB;  Service: Cardiovascular;  Laterality: N/A;    Current Medications: Current Meds  Medication Sig   aspirin 81 MG chewable tablet Chew 1 tablet (81 mg  total) by mouth daily.   atorvastatin (LIPITOR) 80 MG tablet TAKE 1 TABLET BY MOUTH DAILY AT 6 PM.   calcium-vitamin D 250-100 MG-UNIT tablet Take 1 tablet by mouth daily.   diphenhydrAMINE (BENADRYL) 25 MG tablet Take 25 mg by mouth every 6 (six) hours as needed for allergies.   ezetimibe (ZETIA) 10 MG tablet TAKE 1 TABLET BY MOUTH EVERY DAY   ferrous sulfate 325 (65 FE) MG tablet Take 325 mg by mouth 3 (three) times a week. No set days    furosemide (LASIX) 20 MG tablet TAKE 1 TABLET BY MOUTH EVERY DAY   isosorbide mononitrate (IMDUR) 30 MG 24 hr tablet TAKE 1/2 TABLET BY MOUTH DAILY   losartan (COZAAR) 50 MG tablet TAKE 1 TABLET (50 MG TOTAL) BY MOUTH DAILY. PLEASE CALL OFFICE TO SCHEDULE FOLLOW UP APPOINTMENT.   metFORMIN (GLUCOPHAGE-XR) 500 MG 24 hr tablet Take 1 tablet (500 mg total) by mouth daily with breakfast.   methimazole (TAPAZOLE) 5 MG tablet Take 1.5 tablets (7.5 mg total) by mouth 3 (three) times daily.   metoprolol succinate (TOPROL-XL) 25 MG 24 hr tablet TAKE 1 TABLET BY MOUTH EVERY DAY   nitroGLYCERIN (NITROSTAT) 0.4 MG SL tablet Place 1 tablet (0.4 mg total) under the tongue every 5 (five) minutes as needed for chest pain.   spironolactone (ALDACTONE) 25 MG tablet TAKE 1/2 TABLET BY MOUTH EVERY DAY   tiZANidine (ZANAFLEX) 2 MG tablet TAKE 1 TABLET BY MOUTH AT BEDTIME AS NEEDED FOR MUSCLE SPASMS.     Allergies:   Patient has no known allergies.   Social History   Socioeconomic History   Marital status: Married    Spouse name: Not on file   Number of children: 2   Years of education: Not on file   Highest education level: Not on file  Occupational History    Comment: front desk   Tobacco Use   Smoking status: Never   Smokeless tobacco: Never  Vaping Use   Vaping Use: Never used  Substance and Sexual Activity   Alcohol use: Yes    Alcohol/week: 0.0 standard drinks of alcohol    Comment: rare   Drug use: No   Sexual activity: Not Currently  Other Topics Concern   Not on file  Social History Narrative   From [place].   Married.   2 children.   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   Social Determinants of Health   Financial Resource Strain: Not on file  Food Insecurity: Not on file  Transportation Needs: Not on file  Physical Activity: Not on file  Stress: Not on file  Social Connections: Not on file     Family History: The  patient's family history includes Diabetes in her father, mother, and sister; Heart disease in her mother; Hypertension in her sister.  ROS:   Please see the history of present illness.     All other systems reviewed and are negative.  EKGs/Labs/Other Studies Reviewed:    The following studies were reviewed today:  Echo 01/2020  1. Left ventricular ejection fraction, by estimation, is 50 to 55%. The  left ventricle has low normal function. The left ventricle has no regional  wall motion abnormalities. Indeterminate diastolic filling due to E-A  fusion.   2. Right ventricular systolic function is normal. The right ventricular  size is normal.   3. Left atrial size was mildly dilated.   4. The  mitral valve is normal in structure. No evidence of mitral valve  regurgitation.   5. The aortic valve is grossly normal. Aortic valve regurgitation is not  visualized.   Echo 09/2019   1. Left ventricular ejection fraction, by visual estimation, is 40 to  45%. The left ventricle has severely decreased function. Mildly increased  left ventricular size. There is no left ventricular hypertrophy.   2. Left ventricular diastolic Doppler parameters are consistent with  pseudonormalization pattern of LV diastolic filling.   3. Global right ventricle has normal systolic function.The right  ventricular size is normal. No increase in right ventricular wall  thickness.   4. Left atrial size was mildly dilated.   5. Mildly elevated pulmonary artery systolic pressure.   R/L Cardiac cath 05/2019 1st Mrg lesion is 100% stenosed. 2nd Mrg lesion is 100% stenosed. Mid LAD lesion is 80% stenosed. Post intervention, there is a 0% residual stenosis. A drug-eluting stent was successfully placed using a STENT RESOLUTE ONYX 3.0X18. 2nd Diag lesion is 30% stenosed. Prox RCA to Mid RCA lesion is 50% stenosed. RPAV lesion is 40% stenosed. RPDA lesion is 30% stenosed.   1.  Significant two-vessel coronary  artery disease with 80% mid LAD stenosis, occluded OM branches and moderate RCA disease. 2.  Left ventricular angiography was not performed.  EF was severely reduced by echo. 3.  Right heart catheterization showed high normal filling pressures with mean RA pressure of 8 mmHg, pulmonary capillary wedge pressure of 12 to 13 mmHg, mild pulmonary hypertension and high cardiac output at 8 L/min. 4.  Successful angioplasty and drug-eluting stent placement to the mid LAD.   Recommendations: Continue dual antiplatelet therapy for at least 1 year. The patient needs aggressive medical therapy for the rest of her coronary artery disease.  I added high-dose atorvastatin. I added oral metoprolol to control tachycardia. I discontinued IV furosemide for now and she might require resumption of this either intravenously or orally as needed. The patient was alert throughout the procedure .  Her hemodynamics are good enough to allow extubation if no other issues.    Echo 05/2019  1. The left ventricle has severely reduced systolic function, with an  ejection fraction of 25-30%. The cavity size was moderately dilated. Left  ventricular diastolic parameters were normal.   2. 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   3. The right ventricle has normal systolic function. The cavity was  normal. There is no increase in right ventricular wall thickness.   4. The aortic valve is tricuspid. Mild thickening of the aortic valve.   5. The aorta is normal in size and structure.   EKG:  EKG is  ordered today.  The ekg ordered today demonstrates NSR 80bpm, LAD, LVH, nonspecific T wave changes  Recent Labs: 05/19/2021: ALT 11; BUN 13; Creatinine, Ser 0.75; Potassium 3.8; Sodium 141 05/20/2021: Hemoglobin 12.3; Platelets 426.0 04/28/2022: TSH 0.02  Recent Lipid Panel    Component Value Date/Time   CHOL 161 05/19/2021 1111   CHOL 176 01/05/2021 1504   TRIG 110.0 05/19/2021 1111   HDL 38.50 (L)  05/19/2021 4742   HDL 49 01/05/2021 1504   CHOLHDL 4 05/19/2021 1111   VLDL 22.0 05/19/2021 1111   LDLCALC 100 (H) 05/19/2021 1111   LDLCALC 98 01/05/2021 1504   Physical Exam:    VS:  BP 138/76   Pulse 80   Ht 5\' 4"  (1.626 m)   Wt 238 lb (108 kg)   LMP  05/19/2021 (Approximate)   BMI 40.85 kg/m     Wt Readings from Last 3 Encounters:  05/04/22 238 lb (108 kg)  04/28/22 234 lb (106.1 kg)  01/20/22 217 lb 12.8 oz (98.8 kg)     GEN:  Well nourished, well developed in no acute distress HEENT: Normal NECK: No JVD; No carotid bruits LYMPHATICS: No lymphadenopathy CARDIAC: RRR, + murmur, rubs, gallops RESPIRATORY:  Clear to auscultation without rales, wheezing or rhonchi  ABDOMEN: Soft, non-tender, non-distended MUSCULOSKELETAL:  No edema; No deformity  SKIN: Warm and dry NEUROLOGIC:  Alert and oriented x 3 PSYCHIATRIC:  Normal affect   ASSESSMENT:    1. DOE (dyspnea on exertion)   2. Sleep apnea, unspecified type   3. Murmur   4. Chronic systolic heart failure (HCC)   5. Thyroid disease   6. Essential hypertension   7. Hyperlipidemia, mixed   8. Coronary artery disease involving native coronary artery of native heart without angina pectoris    PLAN:    In order of problems listed above:  DOE/Orthopnea Murmur Chronic systolic heart failure Mixed ischemic and NICM Patient reports DOE and orthopnea over the last week, no LLE. She takes lasix 20mg  daily. EF as low as 25-30% in the past from mixed ICM/NICM. Most recent echo 2021 showed LVEF 50-55%, no WMA, no significant valvular abnormality. Today she appears euvolemic on exam. Murmur noted. I will repeat an echocardiogram. She also reports sleep apnea, but has not been recently tested or treated, so I will refer to pulmonology.  I will check BMET and CBC. Continue Toprol, Losartan, spironolactone. Suspect body habitus and general deconditioning may be contributing as well.   CAD s/p DES mLAD 05/2019 Patient denies chest  pain. Continue Lipitor, Aspirin, Zetia, Imdur and Toprol. Plan for echo as above. Lexiscan Myoview April 2021 showed evidence of prior anterior infarct without ischemia. If shortness of breath continues, may need to consider repeat stress test.   Thyroid disease She recently saw endocrinology who increased methimazole.   HTN BP is mildly elevated today. Continue current medications.   HLD LDL 100 05/2021, goal <100. Continue Lipitor and Zetia. Can discuss PCSK9i in the future.   Disposition: Follow up in 6-8 week(s) with MD/APP     Signed, Javonne Dorko 06/2021, PA-C  05/04/2022 12:06 PM    [facility]

## 2022-05-12 ENCOUNTER — Other Ambulatory Visit: Payer: Self-pay | Admitting: Primary Care

## 2022-05-12 DIAGNOSIS — E1165 Type 2 diabetes mellitus with hyperglycemia: Secondary | ICD-10-CM

## 2022-05-23 ENCOUNTER — Ambulatory Visit (INDEPENDENT_AMBULATORY_CARE_PROVIDER_SITE_OTHER): Payer: Worker's Compensation | Admitting: Family Medicine

## 2022-05-23 ENCOUNTER — Ambulatory Visit: Payer: Commercial Managed Care - PPO | Admitting: Family Medicine

## 2022-05-23 ENCOUNTER — Encounter: Payer: Self-pay | Admitting: Family Medicine

## 2022-05-23 VITALS — BP 142/80 | HR 77 | Temp 98.4°F | Ht 64.0 in | Wt 240.0 lb

## 2022-05-23 DIAGNOSIS — S161XXD Strain of muscle, fascia and tendon at neck level, subsequent encounter: Secondary | ICD-10-CM | POA: Diagnosis not present

## 2022-05-23 DIAGNOSIS — M25512 Pain in left shoulder: Secondary | ICD-10-CM

## 2022-05-23 NOTE — Progress Notes (Signed)
[place] T. Nakiah Osgood, MD, CAQ Sports Medicine [facility] [address] [place], [address]  Phone: [phone]  FAX: [phone]  Amber [name] - 51 y.o. female  MRN [id number]  Date of Birth: 1971/02/10  Date: 05/23/2022  PCP: Doreene Nest, NP  Referral: Doreene Nest, NP  Chief Complaint  Patient presents with   Follow-up    Left Shoulder   Subjective:   Amber [name] is a 51 y.o. very pleasant female patient with Body mass index is 41.2 kg/m. who presents with the following:  I saw the patient for left-sided shoulder as well as neck injury in March 2023, and at that point I did have her do some formal physical therapy.  She was having a lot of pain right then as well as some decrease in strength.  Subsequently, she has completed a round of formal physical therapy.  At this point, now in August, she feels like she is back to baseline in terms of her left shoulder.  Her motion is improved, and her strength is also back to baseline.  She is not having any limitation for what she can do at home or at work compared to her prior state of health before her injury sustained in the workplace.    Globally, she is feeling well.  Initially, she did have some neck pain, as well, but at this point this is fully returned back to normal.  01/05/2022 Last OV with Hannah Beat, MD  DOI: 12/30/2021   This is a very nice young lady who I remember from seeing her before presents after an injury at work.  She describes an incident where she was on an escalator and the stairs were behind her.  At that point the stairs had some type of mechanical problem and were shaking and not operating in the manner which they were designed.  She does show me some pictures and it looked as if the escalator was not properly aligned and in some way shape or form damage.  She reports that while her arms were on the handrails she was jostled about and moved side to  side vigorously.  At that time, she did not fall, strike her head, neck, or any of her appendages.  She did have pain right at that time of injury.  This occurred at the escalator in the workplace, at the [employer]. She is worked there for many years.  She was driven to the ER, and evaluated there.  They did do a CT scan of her cervical spine, and this was grossly unremarkable, with the exception of some mild degenerative changes.  Formal read is described below.   At this time her neck does hurt from a posterior aspect, and she describes this pain at a level of 7/10.  At this point, her predominant pain centers on the shoulder and she points to the anterior shoulder.  She describes this pain level at 10/10.  The ER did recommend that she take Tylenol or ibuprofen, and this does seem to help, but after it wears off she is in some significant pain.  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 injury.   Medical history is significant for significant coronary disease, status post MI, diabetes, high cholesterol, Graves' disease.   Review of Systems is noted in the HPI, as appropriate  Objective:   BP (!) 142/80   Pulse 77   Temp  98.4 F (36.9 C) (Oral)   Ht 5\' 4"  (1.626 m)   Wt 240 lb (108.9 kg)   LMP 05/19/2021 (Approximate)   SpO2 96%   BMI 41.20 kg/m   GEN: No acute distress; alert,appropriate. PULM: Breathing comfortably in no respiratory distress PSYCH: Normally interactive.    Shoulder: L Inspection: No muscle wasting or winging Ecchymosis/edema: neg  AC joint, scapula, clavicle: NT Cervical spine: NT, full ROM Spurling's: neg Abduction: full, 5/5 Flexion: full, 5/5 IR, full, lift-off: 5/5 ER at neutral: full, 5/5 AC crossover and compression: neg Neer: neg Hawkins: neg Drop Test: neg Empty Can: neg Supraspinatus insertion: NT Bicipital groove: NT Speed's: neg Yergason's: neg Sulcus sign: neg Scapular dyskinesis:  none C5-T1 intact Sensation intact Grip 5/5   Laboratory and Imaging Data:  Assessment and Plan:     ICD-10-CM   1. Acute pain of left shoulder  M25.512     2. Cervical strain, acute, subsequent encounter  S16.1XXD      The patient has returned to her normal state of function in terms of her left shoulder as well as her neck.  Right now, she has no limitation in function, and her strength and motion have returned to normal.  At this point, she can follow-up with me only on a as needed basis in the future.  Dragon [employer] One speech-to-text software was used for transcription in this dictation.  Possible transcriptional errors can occur using 07/19/2021.   Signed,  Animal nutritionist. Sanah Kraska, MD   Outpatient Encounter Medications as of 05/23/2022  Medication Sig   aspirin 81 MG chewable tablet Chew 1 tablet (81 mg total) by mouth daily.   atorvastatin (LIPITOR) 80 MG tablet TAKE 1 TABLET BY MOUTH DAILY AT 6 PM.   calcium-vitamin D 250-100 MG-UNIT tablet Take 1 tablet by mouth daily.   diphenhydrAMINE (BENADRYL) 25 MG tablet Take 25 mg by mouth every 6 (six) hours as needed for allergies.   ezetimibe (ZETIA) 10 MG tablet TAKE 1 TABLET BY MOUTH EVERY DAY   ferrous sulfate 325 (65 FE) MG tablet Take 325 mg by mouth 3 (three) times a week. No set days   furosemide (LASIX) 20 MG tablet TAKE 1 TABLET BY MOUTH EVERY DAY   isosorbide mononitrate (IMDUR) 30 MG 24 hr tablet TAKE 1/2 TABLET BY MOUTH DAILY   losartan (COZAAR) 50 MG tablet TAKE 1 TABLET (50 MG TOTAL) BY MOUTH DAILY. PLEASE CALL OFFICE TO SCHEDULE FOLLOW UP APPOINTMENT.   metFORMIN (GLUCOPHAGE-XR) 500 MG 24 hr tablet Take 1 tablet (500 mg total) by mouth daily with breakfast. for diabetes.   methimazole (TAPAZOLE) 5 MG tablet Take 1.5 tablets (7.5 mg total) by mouth 3 (three) times daily.   metoprolol succinate (TOPROL-XL) 25 MG 24 hr tablet TAKE 1 TABLET BY MOUTH EVERY DAY   spironolactone (ALDACTONE) 25 MG tablet TAKE 1/2  TABLET BY MOUTH EVERY DAY   tiZANidine (ZANAFLEX) 2 MG tablet TAKE 1 TABLET BY MOUTH AT BEDTIME AS NEEDED FOR MUSCLE SPASMS.   nitroGLYCERIN (NITROSTAT) 0.4 MG SL tablet Place 1 tablet (0.4 mg total) under the tongue every 5 (five) minutes as needed for chest pain.   No facility-administered encounter medications on file as of 05/23/2022.

## 2022-05-26 ENCOUNTER — Other Ambulatory Visit: Payer: Self-pay | Admitting: Cardiovascular Disease

## 2022-06-01 ENCOUNTER — Other Ambulatory Visit (INDEPENDENT_AMBULATORY_CARE_PROVIDER_SITE_OTHER): Payer: Commercial Managed Care - PPO

## 2022-06-01 ENCOUNTER — Other Ambulatory Visit: Payer: Self-pay | Admitting: Internal Medicine

## 2022-06-01 DIAGNOSIS — E0590 Thyrotoxicosis, unspecified without thyrotoxic crisis or storm: Secondary | ICD-10-CM

## 2022-06-02 LAB — T4, FREE: Free T4: 1.33 ng/dL (ref 0.60–1.60)

## 2022-06-02 LAB — TSH: TSH: <0.01 u[IU]/mL — ABNORMAL LOW (ref 0.35–5.50)

## 2022-06-02 LAB — T3: T3, Total: 186 ng/dL — ABNORMAL HIGH (ref 76–181)

## 2022-06-03 ENCOUNTER — Telehealth: Payer: Self-pay | Admitting: Internal Medicine

## 2022-06-03 MED ORDER — METHIMAZOLE 5 MG PO TABS: 10.0000 mg | ORAL_TABLET | Freq: Two times a day (BID) | ORAL | 2 refills | Status: DC

## 2022-06-03 NOTE — Telephone Encounter (Signed)
Patient notified and will pick up medication  

## 2022-06-03 NOTE — Telephone Encounter (Signed)
LMTCB for lab results.  

## 2022-06-03 NOTE — Telephone Encounter (Signed)
Please let the pt know that her thyroid continues to be overactive ( its better but still over active)   Please ask her to increase methimazole to TWO tablets in the morning and TWO tablets in the evening   Please schedule her to see me in 3 months   Thanks

## 2022-06-08 ENCOUNTER — Other Ambulatory Visit: Payer: Commercial Managed Care - PPO

## 2022-06-18 ENCOUNTER — Other Ambulatory Visit: Payer: Self-pay | Admitting: Cardiovascular Disease

## 2022-06-29 ENCOUNTER — Other Ambulatory Visit: Payer: Self-pay | Admitting: Cardiovascular Disease

## 2022-07-04 NOTE — Progress Notes (Deleted)
Cardiology Office Note    Date:  07/04/2022   ID:  Amber [name], DOB December 25, 1970, MRN [id number]  PCP:  Doreene Nest, NP  Cardiologist:  Lorine Bears, MD  Electrophysiologist:  None   Chief Complaint: ***  History of Present Illness:   Amber [name] is a 51 y.o. 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    Patient was seen by PCP in 03/2019 with complaints of unexplained weight loss, palpitations, anxiety, insomnia, and shortness of breath with TSH being found to be suppressed at less than 0.01 with an elevated free T4 of 5.96.  In this setting she was started on methimazole.  She was admitted to [facility] in 05/2019, with acute hypoxic respiratory failure requiring mechanical ventilation in the setting of possible atypical pneumonia, non-STEMI, pulmonary edema, and hyperthyroidism with thyroid storm in the setting of medication noncompliance.  EKG showed sinus tachycardia, 130s bpm, LVH, ST depression consistent with repolarization abnormalities.  D-dimer was less than 0.27.  High-sensitivity troponin was 57 with a delta troponin of 162, and peaking at 9688.  WBC was 19.8.  Chest CT revealed patchy groundglass infiltrates bilaterally predominantly within the bases.  COVID-19 test was negative.  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  Patient's cath had to be delayed until 8/5 in the setting of fever of 102.6 on 8/4.  Patient underwent  right and left cardiac cath on 05/22/2019 which showed significant two-vessel CAD with 80% mid LAD stenosis, occluded OM branches and moderate RCA disease as outlined below.  Right heart catheterization showed high normal filling pressures with a mean RA pressure of 8 mmHg, pulmonary capillary wedge pressure of 12 to 13 mmHg, mild pulmonary hypertension and high cardiac output at 8 L/min.  The patient underwent successful PCI/DES to the mid LAD.  Following her discharge, she was seen in the ED on 05/27/2019 with large-volume watery diarrhea following recent antibiotics for potential pneumonia during her admission above.  C. difficile and GI panel were negative.  Echo performed 08/08/2019 showed an improved EF of 40-45%, DD, normal RVSF and cavity size, mild biatrial enlargement, mild MR/TR, mildly elevated PASP.  She was hospitalized in 01/2020 with atypical chest pain and ruled out.  Imdur was added.  Subsequent outpatient Lexiscan MPI showed evidence of prior anterior infarct without ischemia.  Echo showed an EF of 50 to 55% with no significant valvular abnormalities.  She was most recently seen in the office in 04/2022 reporting worsening dyspnea with associated orthopnea without chest pain.  Her weight was down 1 pound when compared to her visit in 10/2021.  She was referred to pulmonology for consideration of sleep apnea.  Echo was recommended and is currently scheduled for 08/09/2022.  ***   Labs independently reviewed: 05/2022 - TSH less than 0.01, free T4 normal, T3 elevated at 186 04/2022 - potassium 3.7, BUN 14, serum creatinine 0.56, Hgb 12.6, PLT 313 01/2022 - A1c 6.2 05/2021 - albumin 4.1, AST/ALT normal, TC 161, TG 110, HDL 38, LDL 100  Past Medical History:  Diagnosis Date   Acute encephalopathy    Acute pulmonary edema (HCC)    Acute respiratory failure with hypoxia (HCC) 05/20/2019   Anemia    CAD (coronary artery disease)    Community acquired pneumonia    Diabetes mellitus without  complication (HCC)    HFrEF (heart failure with reduced ejection fraction) (HCC)    HTN (hypertension)    Hyperthyroidism    Myocardial infarction (HCC)    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   Thyroid storm     Past Surgical History:  Procedure Laterality Date   CARDIAC CATHETERIZATION     CESAREAN SECTION     x2   COLONOSCOPY N/A 06/24/2021   Procedure: COLONOSCOPY;  Surgeon: [name], MD;  Location: [facility] ENDOSCOPY;  Service: Gastroenterology;  Laterality: N/A;   CORONARY STENT INTERVENTION N/A 05/22/2019   Procedure: CORONARY STENT INTERVENTION;  Surgeon: Iran Ouch, MD;  Location: [facility] INVASIVE CV LAB;  Service: Cardiovascular;  Laterality: N/A;   MASS EXCISION Right 01/18/2013   Procedure: EXCISION right scapular cyst;  Surgeon: Axel Filler, MD;  Location: [facility] ORS;  Service: General;  Laterality: Right;   RIGHT/LEFT HEART CATH AND CORONARY ANGIOGRAPHY N/A 05/22/2019   Procedure: RIGHT/LEFT HEART CATH AND CORONARY ANGIOGRAPHY;  Surgeon: Iran Ouch, MD;  Location: [facility] INVASIVE CV LAB;  Service: Cardiovascular;  Laterality: N/A;    Current Medications: No outpatient medications have been marked as taking for the 07/07/22 encounter (Appointment) with Sondra Barges, PA-C.    Allergies:   Patient has no known allergies.   Social History   Socioeconomic History   Marital status: Married    Spouse name: Not on file   Number of children: 2   Years of education: Not on file   Highest education level: Not on file  Occupational History    Comment: front desk   Tobacco Use   Smoking status: Never   Smokeless tobacco: Never  Vaping Use   Vaping Use: Never used  Substance and Sexual Activity   Alcohol use: Yes    Alcohol/week: 0.0 standard drinks of alcohol    Comment: rare   Drug use: No   Sexual activity: Not Currently  Other Topics Concern   Not on file  Social History Narrative   From [place].   Married.   2 children.   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   Social Determinants of Health   Financial Resource Strain: Not on file  Food Insecurity: Not on file  Transportation Needs: Not on file  Physical Activity: Not on file  Stress: Not on file  Social Connections: Not on file     Family History:  The patient's family history includes Diabetes in her father, mother, and sister; Heart disease in her mother; Hypertension in her sister.  ROS:   ROS   EKGs/Labs/Other Studies Reviewed:    Studies reviewed were summarized above. The additional studies were reviewed today:  R/LHC 05/22/2019: 1st Mrg lesion is 100% stenosed. 2nd Mrg lesion is 100% stenosed. Mid LAD lesion is 80% stenosed. Post intervention, there is a 0% residual stenosis. A drug-eluting stent was successfully placed using a STENT RESOLUTE ONYX 3.0X18. 2nd Diag lesion is 30% stenosed. Prox RCA to Mid RCA lesion is 50% stenosed. RPAV lesion is 40% stenosed. RPDA lesion is 30% stenosed.   1.  Significant two-vessel coronary artery disease with 80% mid LAD stenosis, occluded OM branches and moderate RCA  disease. 2.  Left ventricular angiography was not performed.  EF was severely reduced by echo. 3.  Right heart catheterization showed high normal filling pressures with mean RA pressure of 8 mmHg, pulmonary capillary wedge pressure of 12 to 13 mmHg, mild pulmonary hypertension and high cardiac output at 8 L/min. 4.  Successful angioplasty and drug-eluting stent placement to the mid LAD.   Recommendations: Continue dual antiplatelet therapy for at least 1 year. The patient needs aggressive medical therapy for the rest of her coronary artery disease. I added high-dose atorvastatin. I added oral metoprolol to control tachycardia. I discontinued IV furosemide for now and she might require resumption of this either intravenously or orally as needed. The patient was alert throughout the procedure .  Her  hemodynamics are good enough to allow extubation if no other issues. __________   2D Echo 05/21/2019: 1. The left ventricle has severely reduced systolic function, with an ejection fraction of 25-30%. The cavity size was moderately dilated. Left ventricular diastolic parameters were normal.  2. 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  3. The right ventricle has normal systolic function. The cavity was normal. There is no increase in right ventricular wall thickness.  4. The aortic valve is tricuspid. Mild thickening of the aortic valve.  5. The aorta is normal in size and structure. __________   2D Echo 08/09/2019: 1. Left ventricular ejection fraction, by visual estimation, is 40 to 45%. The left ventricle has severely decreased function. Mildly increased left ventricular size. There is no left ventricular hypertrophy.  2. Left ventricular diastolic Doppler parameters are consistent with pseudonormalization pattern of LV diastolic filling.  3. Global right ventricle has normal systolic function.The right ventricular size is normal. No increase in right ventricular wall thickness.  4. Left atrial size was mildly dilated.  5. Mildly elevated pulmonary artery systolic pressure. __________  2D echo 02/12/2020: 1. Left ventricular ejection fraction, by estimation, is 50 to 55%. The  left ventricle has low normal function. The left ventricle has no regional  wall motion abnormalities. Indeterminate diastolic filling due to E-A  fusion.   2. Right ventricular systolic function is normal. The right ventricular  size is normal.   3. Left atrial size was mildly dilated.   4. The mitral valve is normal in structure. No evidence of mitral valve  regurgitation.   5. The aortic valve is grossly normal. Aortic valve regurgitation is not  visualized. __________  Eugenie Birks MPI 02/19/2020: There was no ST segment deviation noted during stress. There are severe perfusion  defects of mild to moderate size present in the mid to apical anteroseptal wall, basal to mid lateral wall, and the true apex. Findings consistent with prior myocardial infarction. The left ventricular ejection fraction is normal (54%). This is a low to intermediate risk study. There was no evidence for ischemia    EKG:  EKG is ordered today.  The EKG ordered today demonstrates ***  Recent Labs: 05/04/2022: BUN 14; Creatinine, Ser 0.56; Hemoglobin 12.6; Platelets 313; Potassium 3.7; Sodium 141 06/01/2022: TSH <0.01  Recent Lipid Panel    Component Value Date/Time   CHOL 161 05/19/2021 1111   CHOL 176 01/05/2021 1504   TRIG 110.0 05/19/2021 1111   HDL 38.50 (L) 05/19/2021 1111   HDL 49 01/05/2021 1504   CHOLHDL 4 05/19/2021 1111   VLDL 22.0 05/19/2021 1111   LDLCALC 100 (H) 05/19/2021 1111   LDLCALC 98 01/05/2021 1504    PHYSICAL EXAM:  VS:  LMP 05/19/2021 (Approximate)   BMI: There is no height or weight on file to calculate BMI.  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  Hyperthyroidism:   {Are you ordering a CV Procedure (e.g. stress test, cath, DCCV, TEE, etc)?   Press F2        :[id number]}     Disposition: F/u with Dr. Fletcher Anon or an APP in ***.   Medication Adjustments/Labs and Tests Ordered: Current medicines are reviewed at length with the patient today.  Concerns regarding medicines are outlined above. Medication changes, Labs and Tests ordered today are summarized above and listed in the Patient Instructions accessible in Encounters.   Signed, Christell Faith, PA-C 07/04/2022 10:19 AM     [place] [address] [place] [address] [place], [place] [address] [phone]

## 2022-07-05 ENCOUNTER — Ambulatory Visit: Payer: Commercial Managed Care - PPO

## 2022-07-07 ENCOUNTER — Ambulatory Visit: Payer: Commercial Managed Care - PPO | Admitting: Physician Assistant

## 2022-07-26 ENCOUNTER — Other Ambulatory Visit: Payer: Self-pay | Admitting: Cardiovascular Disease

## 2022-07-30 ENCOUNTER — Other Ambulatory Visit: Payer: Self-pay | Admitting: Cardiovascular Disease

## 2022-08-05 ENCOUNTER — Ambulatory Visit: Payer: Commercial Managed Care - PPO | Admitting: Primary Care

## 2022-08-09 ENCOUNTER — Ambulatory Visit: Payer: Commercial Managed Care - PPO | Attending: Medical

## 2022-08-09 DIAGNOSIS — R0600 Dyspnea, unspecified: Secondary | ICD-10-CM

## 2022-08-09 DIAGNOSIS — R0609 Other forms of dyspnea: Secondary | ICD-10-CM

## 2022-08-09 LAB — ECHOCARDIOGRAM COMPLETE
AR max vel: 2.61 cm2
AV Area VTI: 2.38 cm2
AV Area mean vel: 2.43 cm2
AV Mean grad: 4 mmHg
AV Peak grad: 8.2 mmHg
Ao pk vel: 1.43 m/s
Area-P 1/2: 4.36 cm2
Calc EF: 53.8 %
S' Lateral: 3.5 cm
Single Plane A2C EF: 50.9 %
Single Plane A4C EF: 56.6 %

## 2022-08-11 ENCOUNTER — Telehealth: Payer: Self-pay | Admitting: Medical

## 2022-08-11 NOTE — Telephone Encounter (Signed)
Cadence Ninfa Meeker, PA-C  08/11/2022 12:35 PM EDT     Echo showed normal pump function, mildly thickened wall, moderately elevated lung pressures, mildly leaky Mitral valve. Overall looks OK, can discuss at follow-up

## 2022-08-11 NOTE — Telephone Encounter (Signed)
Called no answer - left message on voicemail to return call

## 2022-08-11 NOTE — Telephone Encounter (Signed)
Pt calling back for echo results  

## 2022-08-16 ENCOUNTER — Other Ambulatory Visit: Payer: Self-pay | Admitting: Primary Care

## 2022-08-16 ENCOUNTER — Ambulatory Visit: Payer: Commercial Managed Care - PPO | Admitting: Physician Assistant

## 2022-08-16 DIAGNOSIS — E1165 Type 2 diabetes mellitus with hyperglycemia: Secondary | ICD-10-CM

## 2022-08-17 ENCOUNTER — Encounter: Payer: Self-pay | Admitting: Primary Care

## 2022-08-17 ENCOUNTER — Telehealth: Payer: Self-pay | Admitting: Primary Care

## 2022-08-17 ENCOUNTER — Other Ambulatory Visit (HOSPITAL_COMMUNITY)
Admission: RE | Admit: 2022-08-17 | Discharge: 2022-08-17 | Disposition: A | Discharge status: Home / Self Care | Payer: Commercial Managed Care - PPO | Source: Ambulatory Visit | Attending: Primary Care | Admitting: Primary Care

## 2022-08-17 ENCOUNTER — Ambulatory Visit: Payer: Commercial Managed Care - PPO | Admitting: Primary Care

## 2022-08-17 VITALS — BP 146/88 | HR 70 | Temp 97.2°F | Ht 64.0 in | Wt 242.0 lb

## 2022-08-17 DIAGNOSIS — Z6841 Body Mass Index (BMI) 40.0 and over, adult: Secondary | ICD-10-CM | POA: Diagnosis not present

## 2022-08-17 DIAGNOSIS — I10 Essential (primary) hypertension: Secondary | ICD-10-CM

## 2022-08-17 DIAGNOSIS — I2510 Atherosclerotic heart disease of native coronary artery without angina pectoris: Secondary | ICD-10-CM | POA: Diagnosis not present

## 2022-08-17 DIAGNOSIS — I509 Heart failure, unspecified: Secondary | ICD-10-CM

## 2022-08-17 DIAGNOSIS — R0683 Snoring: Secondary | ICD-10-CM | POA: Diagnosis not present

## 2022-08-17 DIAGNOSIS — Z23 Encounter for immunization: Secondary | ICD-10-CM

## 2022-08-17 DIAGNOSIS — E1165 Type 2 diabetes mellitus with hyperglycemia: Secondary | ICD-10-CM | POA: Diagnosis not present

## 2022-08-17 DIAGNOSIS — Z1231 Encounter for screening mammogram for malignant neoplasm of breast: Secondary | ICD-10-CM

## 2022-08-17 DIAGNOSIS — E0590 Thyrotoxicosis, unspecified without thyrotoxic crisis or storm: Secondary | ICD-10-CM

## 2022-08-17 DIAGNOSIS — E785 Hyperlipidemia, unspecified: Secondary | ICD-10-CM | POA: Diagnosis not present

## 2022-08-17 DIAGNOSIS — Z124 Encounter for screening for malignant neoplasm of cervix: Secondary | ICD-10-CM | POA: Insufficient documentation

## 2022-08-17 DIAGNOSIS — Z0001 Encounter for general adult medical examination with abnormal findings: Secondary | ICD-10-CM

## 2022-08-17 LAB — COMPREHENSIVE METABOLIC PANEL
ALT: 17 U/L (ref 0–35)
AST: 18 U/L (ref 0–37)
Albumin: 4.4 g/dL (ref 3.5–5.2)
Alkaline Phosphatase: 77 U/L (ref 39–117)
BUN: 10 mg/dL (ref 6–23)
CO2: 30 mEq/L (ref 19–32)
Calcium: 9.4 mg/dL (ref 8.4–10.5)
Chloride: 104 mEq/L (ref 96–112)
Creatinine, Ser: 0.5 mg/dL (ref 0.40–1.20)
GFR: 108.03 mL/min (ref >60.00)
Glucose, Bld: 104 mg/dL — ABNORMAL HIGH (ref 70–99)
Potassium: 3.6 mEq/L (ref 3.5–5.1)
Sodium: 141 mEq/L (ref 135–145)
Total Bilirubin: 0.5 mg/dL (ref 0.2–1.2)
Total Protein: 7.7 g/dL (ref 6.0–8.3)

## 2022-08-17 LAB — LIPID PANEL
Cholesterol: 153 mg/dL (ref 0–200)
HDL: 44.9 mg/dL (ref >39.00)
LDL Cholesterol: 84 mg/dL (ref 0–99)
NonHDL: 108.18
Total CHOL/HDL Ratio: 3
Triglycerides: 120 mg/dL (ref 0.0–149.0)
VLDL: 24 mg/dL (ref 0.0–40.0)

## 2022-08-17 LAB — MICROALBUMIN / CREATININE URINE RATIO
Creatinine,U: 26.6 mg/dL
Microalb Creat Ratio: 2.6 mg/g (ref 0.0–30.0)
Microalb, Ur: <0.7 mg/dL (ref 0.0–1.9)

## 2022-08-17 LAB — HEMOGLOBIN A1C: Hgb A1c MFr Bld: 6.8 % — ABNORMAL HIGH (ref 4.6–6.5)

## 2022-08-17 MED ORDER — LOSARTAN POTASSIUM 100 MG PO TABS: 100.0000 mg | ORAL_TABLET | Freq: Every day | ORAL | 0 refills | Status: DC

## 2022-08-17 NOTE — Telephone Encounter (Signed)
Unable to reach patient. Left voicemail to return call to our office.   

## 2022-08-17 NOTE — Assessment & Plan Note (Addendum)
Referral placed to pulmonology for sleep study yet again.  Her cardiologist has also previously referred her. Strong advised that she answer her phone when they call.

## 2022-08-17 NOTE — Telephone Encounter (Signed)
Patient called in and stated that the pharmacy don't have the prescription for losartan (COZAAR) 100 MG tablet. Informed patient that refill was sent in and confirmed by the pharmacy. Patient would like for refill to be sent over again because they don't have it. Please advise. Thank you!

## 2022-08-17 NOTE — Assessment & Plan Note (Signed)
Overdue for second Shingrix vaccine, provided today.  Declines influenza vaccine. Pap smear due, completed today. Colonoscopy up-to-date, due 2032 Mammogram due, orders placed.  Discussed the importance of a healthy diet and regular exercise in order for weight loss, and to reduce the risk of further co-morbidity.  Exam stable. Labs pending.  Follow up in 1 year for repeat physical.

## 2022-08-17 NOTE — Assessment & Plan Note (Signed)
Strongly advise she work on her diet, add exercise.  Referral placed to bariatric services for consultation per patient request.

## 2022-08-17 NOTE — Patient Instructions (Signed)
Stop by the lab prior to leaving today. I will notify you of your results once received.   [facility].   You will be contacted regarding your referral to the lung doctor for the sleep study and for the bariatric surgeon.  Please let us know if you have not been contacted within two weeks.   We increased your dose of losartan to 100 mg daily.  I sent a new prescription to your pharmacy.  This is for your blood pressure.  Continue all other blood pressure medications.  Please schedule a follow up visit to meet back with me in 2-3 weeks for blood pressure check.   It was a pleasure to see you today!  Preventive Care 52-64 Years Old, Female Preventive care refers to lifestyle choices and visits with your health care provider that can promote health and wellness. Preventive care visits are also called wellness exams. What can I expect for my preventive care visit? Counseling Your health care provider may ask you questions about your: Medical history, including: Past medical problems. Family medical history. Pregnancy history. Current health, including: Menstrual cycle. Method of birth control. Emotional well-being. Home life and relationship well-being. Sexual activity and sexual health. Lifestyle, including: Alcohol, nicotine or tobacco, and drug use. Access to firearms. Diet, exercise, and sleep habits. Work and work Statistician. Sunscreen use. Safety issues such as seatbelt and bike helmet use. Physical exam Your health care provider will check your: Height and weight. These may be used to calculate your BMI (body mass index). BMI is a measurement that tells if you are at a healthy weight. Waist circumference. This measures the distance around your waistline. This measurement also tells if you are at a healthy weight and may help predict your risk of certain diseases, such as type 2 diabetes and high blood pressure. Heart rate and blood  pressure. Body temperature. Skin for abnormal spots. What immunizations do I need?  Vaccines are usually given at various ages, according to a schedule. Your health care provider will recommend vaccines for you based on your age, medical history, and lifestyle or other factors, such as travel or where you work. What tests do I need? Screening Your health care provider may recommend screening tests for certain conditions. This may include: Lipid and cholesterol levels. Diabetes screening. This is done by checking your blood sugar (glucose) after you have not eaten for a while (fasting). Pelvic exam and Pap test. Hepatitis B test. Hepatitis C test. HIV (human immunodeficiency virus) test. STI (sexually transmitted infection) testing, if you are at risk. Lung cancer screening. Colorectal cancer screening. Mammogram. Talk with your health care provider about when you should start having regular mammograms. This may depend on whether you have a family history of breast cancer. BRCA-related cancer screening. This may be done if you have a family history of breast, ovarian, tubal, or peritoneal cancers. Bone density scan. This is done to screen for osteoporosis. Talk with your health care provider about your test results, treatment options, and if necessary, the need for more tests. Follow these instructions at home: Eating and drinking  Eat a diet that includes fresh fruits and vegetables, whole grains, lean protein, and low-fat dairy products. Take vitamin and mineral supplements as recommended by your health care provider. Do not drink alcohol if: Your health care provider tells you not to drink. You are pregnant, may be pregnant, or are planning to become pregnant. If you drink alcohol: Limit how much  you have to 0-1 drink a day. Know how much alcohol is in your drink. In the [place] one drink equals one 12 oz bottle of beer (355 mL), one 5 oz glass of wine (148 mL), or one 1 oz glass of  hard liquor (44 mL). Lifestyle Brush your teeth every morning and night with fluoride toothpaste. Floss one time each day. Exercise for at least 30 minutes 5 or more days each week. Do not use any products that contain nicotine or tobacco. These products include cigarettes, chewing tobacco, and vaping devices, such as e-cigarettes. If you need help quitting, ask your health care provider. Do not use drugs. If you are sexually active, practice safe sex. Use a condom or other form of protection to prevent STIs. If you do not wish to become pregnant, use a form of birth control. If you plan to become pregnant, see your health care provider for a prepregnancy visit. Take aspirin only as told by your health care provider. Make sure that you understand how much to take and what form to take. Work with your health care provider to find out whether it is safe and beneficial for you to take aspirin daily. Find healthy ways to manage stress, such as: Meditation, yoga, or listening to music. Journaling. Talking to a trusted person. Spending time with friends and family. Minimize exposure to UV radiation to reduce your risk of skin cancer. Safety Always wear your seat belt while driving or riding in a vehicle. Do not drive: If you have been drinking alcohol. Do not ride with someone who has been drinking. When you are tired or distracted. While texting. If you have been using any mind-altering substances or drugs. Wear a helmet and other protective equipment during sports activities. If you have firearms in your house, make sure you follow all gun safety procedures. Seek help if you have been physically or sexually abused. What's next? Visit your health care provider once a year for an annual wellness visit. Ask your health care provider how often you should have your eyes and teeth checked. Stay up to date on all vaccines. This information is not intended to replace advice given to you by your  health care provider. Make sure you discuss any questions you have with your health care provider. Document Revised: 03/31/2021 Document Reviewed: 03/31/2021 Elsevier Patient Education  [employer].

## 2022-08-17 NOTE — Telephone Encounter (Signed)
Called and spoke with patient. There was a misunderstanding with the pharmacy and they have the prescription and have filled it for her.

## 2022-08-17 NOTE — Assessment & Plan Note (Addendum)
Following with cardiology, office notes reviewed from July 2023.  Continue furosemide 20 mg daily, Imdur 15 mg daily, ARB, spironolactone 12.5 mg daily, metoprolol succinate 25 mg daily. BMP reviewed from July 2023.

## 2022-08-17 NOTE — Addendum Note (Signed)
Addended by: [name] on: 08/17/2022 02:02 PM   Modules accepted: Orders

## 2022-08-17 NOTE — Assessment & Plan Note (Signed)
Continue Zetia 10 mg daily, atorvastatin 80 mg daily. Repeat lipid panel pending.

## 2022-08-17 NOTE — Addendum Note (Signed)
Addended by: Pat Kocher on: 08/17/2022 12:21 PM   Modules accepted: Orders

## 2022-08-17 NOTE — Telephone Encounter (Signed)
Noted.  Prescription for losartan 100 mg was sent to [facility] in [place].  This is the correct pharmacy?

## 2022-08-17 NOTE — Assessment & Plan Note (Signed)
Follow with endocrinology.  Continue methimazole 7.5 mg twice daily. Reviewed labs from August 2023.

## 2022-08-17 NOTE — Progress Notes (Signed)
Subjective:    Patient ID: Amber [name], female    DOB: May 16, 1971, 51 y.o.   MRN: [id number]  HPI  Amber [name] is a very pleasant 51 y.o. female who presents today for complete physical and follow up of chronic conditions. She would also like to mention a few things.   She does not check BP at home. She denies headaches, chest pain, dizziness. She is compliant to her losartan 50 mg daily. She does experience nightly snoring, waking up during the night gasping for air, daytime drowsiness. Her cardiology provider referred her to pulmonology in July 2023, she never heard anything regarding a referral.   She is also interested in weight loss surgery for her obesity. She would like to feel better, increase her activity levels, and come off of her medications.   Immunizations: -Tetanus: 2016 -Influenza: Completed this season  -Shingles: Overdue for second dose -Pneumonia: 2020  Diet: Fair diet.  Exercise: No regular exercise.  Eye exam: Completes annually  Dental exam: Completes semi-annually   Pap Smear: Completed >5 years ago  Mammogram: Completed in August 2022  Colonoscopy: Completed in 2022, due 2032  BP Readings from Last 3 Encounters:  08/17/22 (!) 146/88  05/23/22 (!) 142/80  05/04/22 138/76         Review of Systems  Constitutional:  Positive for fatigue. Negative for unexpected weight change.  HENT:  Negative for rhinorrhea.   Respiratory:  Negative for cough and shortness of breath.   Cardiovascular:  Negative for chest pain.  Gastrointestinal:  Negative for constipation and diarrhea.  Genitourinary:  Negative for difficulty urinating.  Musculoskeletal:  Negative for arthralgias and myalgias.  Skin:  Negative for rash.  Allergic/Immunologic: Negative for environmental allergies.  Neurological:  Negative for dizziness and headaches.  Psychiatric/Behavioral:  Positive for sleep disturbance.          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   Thyroid storm    Unexplained weight loss 03/27/2019    Social History   Socioeconomic History   Marital status: Married    Spouse name: Not on file   Number of children: 2   Years of education: Not on file   Highest education level: Not on file  Occupational History    Comment: front desk   Tobacco Use   Smoking status: Never   Smokeless tobacco: Never  Vaping Use   Vaping Use: Never used  Substance and Sexual Activity   Alcohol use: Yes    Alcohol/week: 0.0 standard drinks of alcohol    Comment: rare   Drug use: No   Sexual activity: Not Currently  Other Topics Concern   Not on file  Social History Narrative   From [place]   Married.   2 children.   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   Social Determinants of Health   Financial Resource Strain: Not on file  Food Insecurity: Not on file  Transportation Needs: Not on file  Physical Activity: Not on file  Stress: Not on file  Social Connections: Not on file  Intimate Partner Violence: Not on file    Past Surgical History:  Procedure Laterality Date   CARDIAC CATHETERIZATION     CESAREAN SECTION     x2   COLONOSCOPY N/A 06/24/2021   Procedure: COLONOSCOPY;  Surgeon: [name], MD;  Location: [facility] ENDOSCOPY;  Service: Gastroenterology;  Laterality: N/A;   CORONARY STENT INTERVENTION N/A 05/22/2019   Procedure: CORONARY STENT INTERVENTION;  Surgeon: Iran Ouch, MD;  Location: [facility] INVASIVE CV LAB;   Service: Cardiovascular;  Laterality: N/A;   MASS EXCISION Right 01/18/2013   Procedure: EXCISION right scapular cyst;  Surgeon: Axel Filler, MD;  Location: [facility] ORS;  Service: General;  Laterality: Right;   RIGHT/LEFT HEART CATH AND CORONARY ANGIOGRAPHY N/A 05/22/2019   Procedure: RIGHT/LEFT HEART CATH AND CORONARY ANGIOGRAPHY;  Surgeon: Iran Ouch, MD;  Location: [facility] INVASIVE CV LAB;  Service: Cardiovascular;  Laterality: N/A;    Family History  Problem Relation Age of Onset   Diabetes Father    Diabetes Mother    Heart disease Mother    Diabetes Sister    Hypertension Sister     No Known Allergies  Current Outpatient Medications on File Prior to Visit  Medication Sig Dispense Refill   aspirin 81 MG chewable tablet Chew 1 tablet (81 mg total) by mouth daily. 30 tablet 11   atorvastatin (LIPITOR) 80 MG tablet TAKE 1 TABLET BY MOUTH DAILY AT 6 PM. 90 tablet 0   calcium-vitamin D 250-100 MG-UNIT tablet Take 1 tablet by mouth daily.     ezetimibe (ZETIA) 10 MG tablet TAKE 1 TABLET BY MOUTH EVERY DAY 30 tablet 6   ferrous sulfate 325 (65 FE) MG tablet Take 325 mg by mouth 3 (three) times a week. No set days     furosemide (LASIX) 20 MG tablet TAKE 1 TABLET BY MOUTH EVERY DAY 30 tablet 3   isosorbide mononitrate (IMDUR) 30 MG 24 hr tablet TAKE 1/2 TABLET BY MOUTH DAILY 45 tablet 2   metFORMIN (GLUCOPHAGE-XR) 500 MG 24 hr tablet Take 1 tablet (500 mg total) by mouth daily with breakfast. for diabetes. 90 tablet 0   methimazole (TAPAZOLE) 5 MG tablet Take 2 tablets (10 mg total) by mouth 2 (two) times daily. 360 tablet 2   metoprolol succinate (TOPROL-XL) 25 MG 24 hr tablet TAKE 1 TABLET BY MOUTH EVERY DAY 90 tablet 0   spironolactone (ALDACTONE) 25 MG tablet TAKE 1/2 TABLET BY MOUTH DAILY 15 tablet 3   nitroGLYCERIN (NITROSTAT) 0.4 MG SL tablet Place 1 tablet (0.4 mg total) under the tongue every 5 (five) minutes as needed for chest pain. 25 tablet 1   No current  facility-administered medications on file prior to visit.    BP (!) 146/88   Pulse 70   Temp (!) 97.2 F (36.2 C) (Temporal)   Ht 5\' 4"  (1.626 m)   Wt 242 lb (109.8 kg)   LMP 05/19/2021 (Approximate)   SpO2 92%   BMI 41.54 kg/m  Objective:   Physical Exam Exam conducted with a chaperone present.  HENT:     Right Ear: Tympanic membrane and ear canal normal.     Left Ear: Tympanic membrane and ear canal normal.     Nose: Nose normal.  Eyes:     Conjunctiva/sclera: Conjunctivae normal.     Pupils: Pupils are equal, round, and reactive to light.  Neck:     Thyroid: No thyromegaly.  Cardiovascular:     Rate and Rhythm: Normal rate and regular rhythm.     Heart sounds: No murmur heard. Pulmonary:     Effort: Pulmonary effort is normal.     Breath sounds: Normal breath sounds. No rales.  Abdominal:     General: Bowel sounds are normal.     Palpations: Abdomen is soft.     Tenderness: There is no abdominal tenderness.  Genitourinary:    Labia:        Right: No tenderness or lesion.        Left: No tenderness or lesion.      Vagina: No erythema or bleeding.     Cervix: Discharge present. No cervical motion tenderness.     Uterus: Normal.      Adnexa: Right adnexa normal and left adnexa normal.     Comments: Scant amount of whitish discharge Musculoskeletal:        General: Normal range of motion.     Cervical back: Neck supple.  Lymphadenopathy:     Cervical: No cervical adenopathy.  Skin:    General: Skin is warm and dry.     Findings: No rash.  Neurological:     Mental Status: She is alert and oriented to person, place, and time.     Cranial Nerves: No cranial nerve deficit.     Deep Tendon Reflexes: Reflexes are normal and symmetric.  Psychiatric:        Mood and Affect: Mood normal.           Assessment & Plan:   Problem List Items Addressed This Visit       Cardiovascular and Mediastinum   Essential hypertension    Above goal today, no home readings  to report.  Do suspect untreated sleep apnea is contributing. Will place new referral to pulmonology for further evaluation. We discussed that she will need to answer her phone when they called.  Increase losartan to 100 mg daily. Continue spironolactone 12.5 mg daily, Imdur 15 mg daily.  Follow up in 2-3 weeks for BP check.  BMP reviewed from July 2023.      Relevant Medications   losartan (COZAAR) 100 MG tablet   Other Relevant Orders   Comprehensive metabolic panel   CAD (coronary artery disease), native coronary artery    Following with cardiology.  Continue Imdur 15 mg daily, metoprolol succinate 25 mg daily. Will work on BP control. Continue diabetes control.        Relevant Medications   losartan (COZAAR) 100 MG tablet   CHF (congestive heart failure) [facility])    Following with cardiology, office notes reviewed from July 2023.  Continue furosemide 20 mg daily, Imdur 15 mg daily, ARB, spironolactone 12.5 mg daily, metoprolol succinate 25 mg daily. BMP reviewed from July 2023.        Relevant Medications   losartan (COZAAR) 100 MG tablet     Endocrine   Type 2 diabetes mellitus with hyperglycemia (HCC)    Repeat A1C pending.  Continue metformin XR 500 mg daily. Foot exam today.  Urine microalbumin due and pending.  Follow-up in 6 months.      Relevant Medications   losartan (COZAAR) 100 MG tablet   Other Relevant Orders   Microalbumin/Creatinine Ratio, Urine   Hemoglobin A1c   Hyperthyroidism    Follow with endocrinology.  Continue methimazole 7.5 mg twice daily. Reviewed labs from August 2023.  Other   Encounter for annual general medical examination with abnormal findings in adult - Primary    Overdue for second Shingrix vaccine, provided today.  Declines influenza vaccine. Pap smear due, completed today. Colonoscopy up-to-date, due 2032 Mammogram due, orders placed.  Discussed the importance of a healthy diet and regular exercise in  order for weight loss, and to reduce the risk of further co-morbidity.  Exam stable. Labs pending.  Follow up in 1 year for repeat physical.       Hyperlipidemia    Continue Zetia 10 mg daily, atorvastatin 80 mg daily. Repeat lipid panel pending.      Relevant Medications   losartan (COZAAR) 100 MG tablet   Other Relevant Orders   Lipid panel   Snoring    Referral placed to pulmonology for sleep study yet again.  Her cardiologist has also previously referred her. Strong advised that she answer her phone when they call.      Relevant Orders   Ambulatory referral to Pulmonology   Morbid obesity with BMI of 40.0-44.9, adult (HCC)    Strongly advise she work on her diet, add exercise.  Referral placed to bariatric services for consultation per patient request.      Relevant Orders   Amb Referral to Bariatric Surgery   Other Visit Diagnoses     Screening for cervical cancer       Relevant Orders   Cytology - PAP   Encounter for screening mammogram for malignant neoplasm of breast       Relevant Orders   MM 3D SCREEN BREAST BILATERAL          Doreene Nest, NP

## 2022-08-17 NOTE — Assessment & Plan Note (Addendum)
Following with cardiology.  Continue Imdur 15 mg daily, metoprolol succinate 25 mg daily. Will work on BP control. Continue diabetes control.

## 2022-08-17 NOTE — Assessment & Plan Note (Signed)
Above goal today, no home readings to report.  Do suspect untreated sleep apnea is contributing. Will place new referral to pulmonology for further evaluation. We discussed that she will need to answer her phone when they called.  Increase losartan to 100 mg daily. Continue spironolactone 12.5 mg daily, Imdur 15 mg daily.  Follow up in 2-3 weeks for BP check.  BMP reviewed from July 2023.

## 2022-08-17 NOTE — Telephone Encounter (Signed)
Noted  

## 2022-08-17 NOTE — Assessment & Plan Note (Signed)
Repeat A1C pending.  Continue metformin XR 500 mg daily. Foot exam today.  Urine microalbumin due and pending.  Follow-up in 6 months.

## 2022-08-18 ENCOUNTER — Other Ambulatory Visit: Payer: Self-pay | Admitting: Primary Care

## 2022-08-19 LAB — CYTOLOGY - PAP
Adequacy: ABSENT
Comment: NEGATIVE
Diagnosis: NEGATIVE
High risk HPV: NEGATIVE

## 2022-08-19 NOTE — Telephone Encounter (Signed)
Attempted to call the patient. No answer- I left a message to please call back.  

## 2022-08-19 NOTE — Telephone Encounter (Signed)
  Donnalee Curry K 08/11/2022  1:00 PM EDT     Left message for patient to call back for results.

## 2022-08-22 NOTE — Telephone Encounter (Signed)
Patient made aware of results and verbalized understanding.  Appointment 11/13

## 2022-08-28 NOTE — Progress Notes (Signed)
Cardiology Office Note:    Date:  08/29/2022   ID:  Amber [name], DOB 27-May-1971, MRN [id number]  PCP:  Amber Nest, NP   Amber [name] Cardiologist:  Amber Bears, MD     Referring MD: Amber Nest, NP   Chief Complaint  Patient presents with   Follow-up    6-8 week follow up, no new cardiac concerns     History of Present Illness:    Amber [name] is a 51 y.o. female with a hx of the following:   HTN NSTEMI CAD, s/p PCI DES mid LAD (05/2019) HFrEF (mixed NICM/ICM with improved EF) T2DM Hyperthyroidism HLD Obesity OSA, not on CPAP  In August 2020, she was hospitalized with severe hyperthyroidism.  Presented with acute respiratory failure needing mechanical ventilation and had NSTEMI and pulmonary edema.  Echo revealed EF 25 to 30% with wall motion abnormalities suggestive of stress-induced cardiomyopathy versus anterior infarct.  Underwent right and left heart cath and found to have two-vessel coronary artery disease with 80% stenosis in the mid LAD, occluded OM branches and moderate RCA disease.  PCI and drug-eluting stent placement was done to mid LAD.  Was treated for hypothyroidism and GDMT optimized, repeat echo showed improvement in EF at 40 to 45%. In 2021 she was hospitalized with atypical chest pain and ruled out for MI.  Lexiscan showed evidence of prior anterior infarct without ischemia.  EF 50 to 55% without significant valvular abnormalities.   Last seen by Cadence Lorna Few on May 04, 2022.  She reported worsening S OB.  A week prior to the office visit she was laying down and had a sudden onset shortness of breath, requiring her to sleep in the chair.  Also noted dyspnea on exertion.  She reported she had a history of obstructive sleep apnea however she did not use CPAP.  Repeat echocardiogram revealed EF 55 to 60%, no RWMA, mild LVH.  Moderately elevated pulmonary artery systolic pressure, estimated RVSP P is 54 mmHg, mild MR,  borderline dilatation of ascending aorta measuring 37 mm.   Today she presents for follow-up.  She states she is doing much better since last office visit, her breathing has much improved, only has some shortness of breath with walking or with exertion.  Her weight is only up 3 pounds from 2 weeks ago.  She states she has had difficulty with managing her thyroid.  Chief concern today is few days ago she noticed tingling on left side of face, did not last long, only a few minutes, occurred for 2 days and hasn't recurred since.  Denied any other strokelike symptoms, facial paralysis, or slurred speech.  Attributes this to recent stress.  She states she has chronic stress.  Denies any chest pain, worsening shortness of breath, palpitations, syncope, presyncope, dizziness, swelling, orthopnea, PND, bleeding, or claudication.  States she does not check her blood pressure at home but recently PCP increased her blood pressure medication.  Says she has not heard about seeing a lung doctor or referral to lung doctor.  Denies any other questions or concerns today.   Past Medical History:  Diagnosis Date   Acute encephalopathy    Acute pulmonary edema (HCC)    Acute respiratory failure with hypoxia (HCC) 05/20/2019   Anemia    Aortic dilatation (HCC)    37 mm (ascending aorta) 07/2022   CAD (coronary artery disease)    s/p DES to mLAD in 2020, NSTEMI, HFrEF with improvement in EF.  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   Thyroid storm    Unexplained weight loss 03/27/2019   Valvular insufficiency    Mild MR mild TR noted on echocardiogram in October 2023.    Past Surgical History:  Procedure Laterality Date   CARDIAC CATHETERIZATION     CESAREAN SECTION     x2   COLONOSCOPY N/A 06/24/2021   Procedure: COLONOSCOPY;  Surgeon: [name], MD;  Location: [facility];  Service: Gastroenterology;  Laterality: N/A;   CORONARY STENT INTERVENTION N/A 05/22/2019   Procedure: CORONARY STENT INTERVENTION;  Surgeon: Iran Ouch, MD;  Location: [facility];  Service: Cardiovascular;  Laterality: N/A;   MASS EXCISION Right 01/18/2013   Procedure: EXCISION right scapular cyst;  Surgeon: Axel Filler, MD;  Location: [facility];  Service: General;  Laterality: Right;   RIGHT/LEFT HEART CATH AND CORONARY ANGIOGRAPHY N/A 05/22/2019   Procedure: RIGHT/LEFT HEART CATH AND CORONARY ANGIOGRAPHY;  Surgeon: Iran Ouch, MD;  Location: [facility];  Service: Cardiovascular;  Laterality: N/A;    Current Medications: Current Meds  Medication Sig   aspirin 81 MG chewable tablet Chew 1 tablet (81 mg total) by mouth daily.   atorvastatin (LIPITOR) 80 MG tablet TAKE 1 TABLET BY MOUTH DAILY AT 6 PM.   calcium-vitamin D 250-100 MG-UNIT tablet Take 1 tablet by mouth daily.   ezetimibe (ZETIA) 10 MG tablet TAKE 1 TABLET BY MOUTH EVERY DAY   ferrous sulfate 325 (65 FE) MG tablet Take 325 mg by mouth 3 (three) times a week. No set days   furosemide (LASIX) 20 MG tablet TAKE 1 TABLET BY MOUTH EVERY DAY   isosorbide mononitrate (IMDUR) 30 MG 24 hr tablet TAKE 1/2 TABLET BY MOUTH DAILY   losartan (COZAAR) 100 MG tablet Take 1 tablet (100 mg total) by mouth daily. for blood pressure.   metFORMIN (GLUCOPHAGE-XR) 500 MG 24 hr tablet TAKE 1 TABLET (500 MG TOTAL) BY MOUTH DAILY WITH BREAKFAST. FOR DIABETES.   methimazole (TAPAZOLE) 5 MG tablet Take 2 tablets (10 mg total)  by mouth 2 (two) times daily.   metoprolol succinate (TOPROL-XL) 25 MG 24 hr tablet TAKE 1 TABLET BY MOUTH EVERY DAY   nitroGLYCERIN (NITROSTAT) 0.4 MG SL tablet Place 1 tablet (0.4 mg total) under the tongue every 5 (five) minutes as needed for chest pain.   spironolactone (ALDACTONE) 25 MG tablet TAKE 1/2 TABLET BY MOUTH DAILY     Allergies:   Patient has no known allergies.   Social History   Socioeconomic History   Marital status: Married    Spouse name: Not on file   Number of children: 2   Years of education: Not on file   Highest education level: Not on file  Occupational History    Comment: front desk   Tobacco Use   Smoking status: Never   Smokeless tobacco: Never  Vaping Use   Vaping Use: Never used  Substance and Sexual Activity   Alcohol use: Yes    Alcohol/week: 0.0 standard drinks of alcohol    Comment: rare   Drug use: No   Sexual activity: Not Currently  Other Topics Concern   Not on file  Social History Narrative   From [place].   Married.   2 children.   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   Social Determinants of Health   Financial Resource Strain: Not on file  Food Insecurity: Not on file  Transportation Needs: Not on file  Physical Activity: Not on file  Stress: Not on file  Social Connections: Not on file     Family History: The patient's family history includes Diabetes in her father, mother, and sister; Heart disease in her mother; Hypertension in her sister.  ROS:   Review of Systems  Constitutional:  Positive for malaise/fatigue. Negative for chills, diaphoresis, fever and weight loss.  HENT: Negative.    Eyes:  Positive for redness. Negative for blurred vision, double vision, photophobia, pain and discharge.       Mild eye redness with mild exophthalmos, history of insomnia.  History of Graves' disease since 2020.  Respiratory:  Positive for shortness of breath. Negative  for cough, hemoptysis, sputum production and wheezing.        See HPI.   Cardiovascular: Negative.   Gastrointestinal: Negative.   Genitourinary: Negative.   Musculoskeletal: Negative.   Skin: Negative.   Neurological:  Positive for tingling and sensory change. Negative for dizziness, tremors, speech change, focal weakness, seizures, loss of consciousness, weakness and headaches.       See HPI.  Endo/Heme/Allergies: Negative.   Psychiatric/Behavioral:  Negative for depression, hallucinations, memory loss, substance abuse and suicidal ideas. The patient is nervous/anxious and has insomnia.     Please see the history of present illness.    All other systems reviewed and are negative.  EKGs/Labs/Other Studies Reviewed:    The following studies were reviewed today:   EKG:  EKG is ordered today.  The ekg ordered today demonstrates normal sinus rhythm, LVH with repolarization abnormality, possible left atrial enlargement, otherwise no acute ischemic changes.   2D echo complete on August 09, 2022:  1. Left ventricular ejection fraction, by estimation, is 55 to 60%. The  left ventricle has normal function. The left ventricle has no regional  wall motion abnormalities. There is mild left ventricular hypertrophy.  Left ventricular diastolic parameters  were normal.   2. Right ventricular systolic function is normal. The right ventricular  size is normal. There is moderately elevated pulmonary artery systolic  pressure. The estimated right ventricular systolic pressure is 54.0 mmHg.   3. The mitral valve is normal in structure. Mild mitral valve  regurgitation. No evidence of mitral stenosis.   4. The aortic valve has an indeterminant number of cusps. Aortic valve  regurgitation is not visualized. No aortic stenosis is present.   5. There is borderline dilatation of the ascending aorta, measuring 37  mm.   6. The inferior vena cava is normal in size with greater than 50%  respiratory  variability, suggesting right atrial pressure of 3 mmHg.  Myoview on Feb 19, 2020: There was no ST segment deviation noted during stress. There are severe perfusion defects of mild to moderate size present in the mid to apical anteroseptal wall, basal to mid lateral wall, and the true  apex. Findings consistent with prior myocardial infarction. The left ventricular ejection fraction is normal (54%). This is a low to intermediate risk study. There was no evidence for ischemia.  Right and left heart cath on May 22, 2019:  1st Mrg lesion is 100% stenosed. 2nd Mrg lesion is 100% stenosed. Mid LAD lesion is 80% stenosed. Post intervention, there is a 0% residual stenosis. A drug-eluting stent was successfully placed using a STENT RESOLUTE ONYX 3.0X18. 2nd Diag lesion is 30% stenosed. Prox RCA to Mid RCA lesion is 50% stenosed. RPAV lesion is 40% stenosed. RPDA lesion is 30% stenosed.   1.  Significant two-vessel coronary artery disease with 80% mid LAD stenosis, occluded OM branches and moderate RCA disease. 2.  Left ventricular angiography was not performed.  EF was severely reduced by echo. 3.  Right heart catheterization showed high normal filling pressures with mean RA pressure of 8 mmHg, pulmonary capillary wedge pressure of 12 to 13 mmHg, mild pulmonary hypertension and high cardiac output at 8 L/min. 4.  Successful angioplasty and drug-eluting stent placement to the mid LAD.   Recommendations: Continue dual antiplatelet therapy for at least 1 year. The patient needs aggressive medical therapy for the rest of her coronary artery disease.  I added high-dose atorvastatin. I added oral metoprolol to control tachycardia. I discontinued IV furosemide for now and she might require resumption of this either intravenously or orally as needed. The patient was alert throughout the procedure .  Her hemodynamics are good enough to allow extubation if no other issues.  Recent Labs: 05/04/2022:  Hemoglobin 12.6; Platelets 313 06/01/2022: TSH <0.01 08/17/2022: ALT 17; BUN 10; Creatinine, Ser 0.50; Potassium 3.6; Sodium 141  Recent Lipid Panel    Component Value Date/Time   CHOL 153 08/17/2022 1150   CHOL 176 01/05/2021 1504   TRIG 120.0 08/17/2022 1150   HDL 44.90 08/17/2022 1150   HDL 49 01/05/2021 1504   CHOLHDL 3 08/17/2022 1150   VLDL 24.0 08/17/2022 1150   LDLCALC 84 08/17/2022 1150   LDLCALC 98 01/05/2021 1504    Physical Exam:    VS:  BP 135/80 (BP Location: Left Arm, Patient Position: Sitting, Cuff Size: Normal) Comment (BP Location): left forearm  Pulse 69   Ht  (1.626 m)   Wt 245 lb 3.2 oz (111.2 kg)   LMP 05/19/2021 (Approximate)   SpO2 94%   BMI 42.09 kg/m     Wt Readings from Last 3 Encounters:  08/29/22 245 lb 3.2 oz (111.2 kg)  08/17/22 242 lb (109.8 kg)  05/23/22 240 lb (108.9 kg)     GEN: Obese, 51 year old female in no acute distress HEENT: Mild exophthalmos bilaterally (history of Graves' disease) with mild eye redness, otherwise nothing acute  NECK: No JVD; enlarged thyroid, no carotid bruits CARDIAC: S1/S2, RRR, grade 2/6 systolic murmur heard along right sternal border, no rubs or gallops noted; 2+ peripheral pulses throughout, strong and equal bilaterally RESPIRATORY:  Clear and diminished to auscultation without rales, wheezing or rhonchi  MUSCULOSKELETAL: Generalized, nonpitting, otherwise nothing acute; No deformity  SKIN: Thin skin, warm and dry NEUROLOGIC:  Alert and oriented x 3, equal grip and sensation, no tingling or paresthesias on exam, EOMI PSYCHIATRIC:  Normal, pleasant affect   ASSESSMENT:    1. Chronic heart failure with preserved ejection fraction (HCC)   2. Other cardiomyopathy (HCC)   3. Pulmonary hypertension, unspecified (HCC)   4. Murmur   5. Valvular insufficiency   6. Aortic dilatation (HCC)  7. Coronary artery disease involving native heart without angina pectoris, unspecified vessel or lesion type   8.  Hyperlipidemia, unspecified hyperlipidemia type   9. Hypertension, unspecified type   10. Hyperthyroidism   11. Class 3 severe obesity due to excess calories with serious comorbidity and body mass index (BMI) of 40.0 to 44.9 in adult (HCC)   12. Pulmonary HTN (HCC)   13. OSA (obstructive sleep apnea)   14. Congestive heart failure, unspecified HF chronicity, unspecified heart failure type (HCC)    PLAN:    In order of problems listed above:  HFimpEF, mixed ischemic and nonischemic cardiomyopathy Myoview 2021 did not reveal any ischemia, there were findings consistent with prior MI, EF 54%.  Recent TTE revealed EF 55 to 60%.  SHOB  has improved, does note DOE. Euvolemic and well compensated on exam.  Continue aspirin, Lipitor, Zetia, Lasix, Imdur, losartan, Toprol-XL, Aldactone, and nitroglycerin as needed. Low sodium diet, fluid restriction <2L, and daily weights encouraged. Educated to contact our office for weight gain of 2 lbs overnight or 5 lbs in one week. Heart healthy diet and regular cardiovascular exercise as tolerated encouraged.   Pulmonary hypertension Moderately elevated PASP noted on TTE 07/2022, estimated RVSP 54.0 mmHg.  Shortness of breath has improved. Etiology most likely hx of CHF and untreated OSA, no signs of PE.  Patient was unaware of previous pulmonary referral, and I do not see where she seen pulmonary in the past.  We will place referral for pulmonary today, and may require some PFTs and most likely needs repeat sleep study, untreated OSA could likely be causing this.  Continue current medication regimen.   Murmur, valvular insufficiency Mild MR and mild TR noted on echocardiogram in October 2023.  Grade 2/6 systolic murmur noted on right sternal border on exam. Only notes dyspnea on exertion with walking.  States overall breathing is improved.  Recommend updating 2D echocardiogram when clinically indicated.   4. Aortic dilatation Noted on 2D echo in October 2023.  Borderline dilatation of ascending aorta, measuring 37 mm.  Recommend obtaining 2D echo next year for monitoring.  CAD, status post drug-eluting stent to mid LAD in 2020 No evidence of ischemia seen on Myoview in 2021, did not show evidence of previous MI. Stable with no anginal symptoms. No indication for ischemic evaluation.  If shortness of breath worsens in the future, recommend repeating Lexiscan.  Continue aspirin, Lipitor, Zetia, Imdur, losartan, Toprol-XL, Aldactone, and nitroglycerin as needed. Heart healthy diet and regular cardiovascular exercise as tolerated encouraged.   Hyperlipidemia Recent lipid panel obtained with PCP.  LDL 84.  LDL goal less than 70. Heart healthy diet and regular cardiovascular exercise as tolerated encouraged.  Continue Lipitor and Zetia.  Hypertension Initial BP on arrival 142/90, repeat BP 135/80.  Discussed SBP goal is less than 130. Discussed to monitor BP at home at least 2 hours after medications and sitting for 5-10 minutes.  Discussed with her if SBP is consistently 130 or above, she will need to reach out to clinic and we will need to adjust antihypertensives.  Recommend that if SBP is not at goal in the future, will need to increase Aldactone and repeat BMET in 2 weeks.  Hyperthyroidism She currently follows endocrinologist, who is managing this.  Labs in August 2023 revealed TSH < 0.01, normal free T4, and elevated T3 at 186.  Continue to follow-up with endocrinology and continue Tapazole.  Class 3 Severe Obesity BMI today 42.09.  Weight is up 3 lbs  from about 2 weeks ago, does not appear fluid volume up. Weight loss via diet and exercise encouraged. Discussed the impact being overweight would have on cardiovascular risk. Heart healthy diet and regular cardiovascular exercise as tolerated encouraged.   9.  Disposition: Follow-up with APP in 2 to 3 months or sooner if anything changes.   Medication Adjustments/Labs and Tests Ordered: Current  medicines are reviewed at length with the patient today.  Concerns regarding medicines are outlined above.  Orders Placed This Encounter  Procedures   Ambulatory referral to Pulmonology   EKG 12-Lead   No orders of the defined types were placed in this encounter.   Patient Instructions  Medication Instructions:  No changes *If you need a refill on your cardiac medications before your next appointment, please call your pharmacy*   Lab Work: None ordered If you have labs (blood work) drawn today and your tests are completely normal, you will receive your results only by: MyChart Message (if you have MyChart) OR A paper copy in the mail If you have any lab test that is abnormal or we need to change your treatment, we will call you to review the results.   Testing/Procedures: None ordered   Follow-Up: At [facility], you and your health needs are our priority.  As part of our continuing mission to provide you with exceptional heart care, we have created designated Provider Care Teams.  These Care Teams include your primary Cardiologist (physician) and [name] (APPs -  Physician Assistants and Nurse Practitioners) who all work together to provide you with the care you need, when you need it.  We recommend signing up for the patient portal called "MyChart".  Sign up information is provided on this After Visit Summary.  MyChart is used to connect with patients for Virtual Visits (Telemedicine).  Patients are able to view lab/test results, encounter notes, upcoming appointments, etc.  Non-urgent messages can be sent to your provider as well.   To learn more about what you can do with MyChart, go to [email or web address].    Your next appointment:   2 month(s)  The format for your next appointment:   In Person  Provider:   You may see Amber Bears, MD or one of the following [name] on your designated Care Team:   Nicolasa Ducking, NP Eula Listen, PA-C Cadence Fransico Michael, PA-C Charlsie Quest, NP    A referral has been placed to Pulmonary  Please check your blood pressure daily. If the top number consistently stays over 130 or 140, please call the office at [phone].  Keep a journal of these daily blood pressure and heart rate readings and call our office or send a message through MyChart with the results. Thank you!  It is best to check your BP 1-2 hours after taking your medications to see the medications effectiveness on your BP.    Here are some tips that our clinical pharmacists share for home BP monitoring:          Rest 10 minutes before taking your blood pressure.          Don't smoke or drink caffeinated beverages for at least 30 minutes before.          Take your blood pressure before (not after) you eat.          Sit comfortably with your back supported and both feet on the floor (don't cross your legs).  Elevate your arm to heart level on a table or a desk.          Use the proper sized cuff. It should fit smoothly and snugly around your bare upper arm. There should be enough room to slip a fingertip under the cuff. The bottom edge of the cuff should be 1 inch above the crease of the elbow.  Other Instructions Heart-Healthy Eating Plan Many factors influence your heart health, including eating and exercise habits. Heart health is also called coronary health. Coronary risk increases with abnormal blood fat (lipid) levels. A heart-healthy eating plan includes limiting unhealthy fats, increasing healthy fats, limiting salt (sodium) intake, and making other diet and lifestyle changes. What is my plan? Your health care provider may recommend that: You limit your fat intake to _________% or less of your total calories each day. You limit your saturated fat intake to _________% or less of your total calories each day. You limit the amount of cholesterol in your diet to less than _________ mg per  day. You limit the amount of sodium in your diet to less than _________ mg per day. What are tips for following this plan? Cooking Cook foods using methods other than frying. Baking, boiling, grilling, and broiling are all good options. Other ways to reduce fat include: Removing the skin from poultry. Removing all visible fats from meats. Steaming vegetables in water or broth. Meal planning  At meals, imagine dividing your plate into fourths: Fill one-half of your plate with vegetables and green salads. Fill one-fourth of your plate with whole grains. Fill one-fourth of your plate with lean protein foods. Eat 2-4 cups of vegetables per day. One cup of vegetables equals 1 cup (91 g) broccoli or cauliflower florets, 2 medium carrots, 1 large bell pepper, 1 large sweet potato, 1 large tomato, 1 medium white potato, 2 cups (150 g) raw leafy greens. Eat 1-2 cups of fruit per day. One cup of fruit equals 1 small apple, 1 large banana, 1 cup (237 g) mixed fruit, 1 large orange,  cup (82 g) dried fruit, 1 cup (240 mL) 100% fruit juice. Eat more foods that contain soluble fiber. Examples include apples, broccoli, carrots, beans, peas, and barley. Aim to get 25-30 g of fiber per day. Increase your consumption of legumes, nuts, and seeds to 4-5 servings per week. One serving of dried beans or legumes equals  cup (90 g) cooked, 1 serving of nuts is  oz (12 almonds, 24 pistachios, or 7 walnut halves), and 1 serving of seeds equals  oz (8 g). Fats Choose healthy fats more often. Choose monounsaturated and polyunsaturated fats, such as olive and canola oils, avocado oil, flaxseeds, walnuts, almonds, and seeds. Eat more omega-3 fats. Choose salmon, mackerel, sardines, tuna, flaxseed oil, and ground flaxseeds. Aim to eat fish at least 2 times each week. Check food labels carefully to identify foods with trans fats or high amounts of saturated fat. Limit saturated fats. These are found in animal products,  such as meats, butter, and cream. Plant sources of saturated fats include palm oil, palm kernel oil, and coconut oil. Avoid foods with partially hydrogenated oils in them. These contain trans fats. Examples are stick margarine, some tub margarines, cookies, crackers, and other baked goods. Avoid fried foods. General information Eat more home-cooked food and less restaurant, buffet, and fast food. Limit or avoid alcohol. Limit foods that are high in added sugar and simple starches such as foods made using white refined flour (white  breads, pastries, sweets). Lose weight if you are overweight. Losing just 5-10% of your body weight can help your overall health and prevent diseases such as diabetes and heart disease. Monitor your sodium intake, especially if you have high blood pressure. Talk with your health care provider about your sodium intake. Try to incorporate more vegetarian meals weekly. What foods should I eat? Fruits All fresh, canned (in natural juice), or frozen fruits. Vegetables Fresh or frozen vegetables (raw, steamed, roasted, or grilled). Green salads. Grains Most grains. Choose whole wheat and whole grains most of the time. Rice and pasta, including brown rice and pastas made with whole wheat. Meats and other proteins Lean, well-trimmed beef, veal, pork, and lamb. Chicken and [place] without skin. All fish and shellfish. Wild duck, rabbit, pheasant, and venison. Egg whites or low-cholesterol egg substitutes. Dried beans, peas, lentils, and tofu. Seeds and most nuts. Dairy Low-fat or nonfat cheeses, including ricotta and mozzarella. Skim or 1% milk (liquid, powdered, or evaporated). Buttermilk made with low-fat milk. Nonfat or low-fat yogurt. Fats and oils Non-hydrogenated (trans-free) margarines. Vegetable oils, including soybean, sesame, sunflower, olive, avocado, peanut, safflower, corn, canola, and cottonseed. Salad dressings or mayonnaise made with a vegetable  oil. Beverages Water (mineral or sparkling). Coffee and tea. Unsweetened ice tea. Diet beverages. Sweets and desserts Sherbet, gelatin, and fruit ice. Small amounts of dark chocolate. Limit all sweets and desserts. Seasonings and condiments All seasonings and condiments. The items listed above may not be a complete list of foods and beverages you can eat. Contact a dietitian for more options. What foods should I avoid? Fruits Canned fruit in heavy syrup. Fruit in cream or butter sauce. Fried fruit. Limit coconut. Vegetables Vegetables cooked in cheese, cream, or butter sauce. Fried vegetables. Grains Breads made with saturated or trans fats, oils, or whole milk. Croissants. Sweet rolls. Donuts. High-fat crackers, such as cheese crackers and chips. Meats and other proteins Fatty meats, such as hot dogs, ribs, sausage, bacon, rib-eye roast or steak. High-fat deli meats, such as salami and bologna. Caviar. Domestic duck and goose. Organ meats, such as liver. Dairy Cream, sour cream, cream cheese, and creamed cottage cheese. Whole-milk cheeses. Whole or 2% milk (liquid, evaporated, or condensed). Whole buttermilk. Cream sauce or high-fat cheese sauce. Whole-milk yogurt. Fats and oils Meat fat, or shortening. Cocoa butter, hydrogenated oils, palm oil, coconut oil, palm kernel oil. Solid fats and shortenings, including bacon fat, salt pork, lard, and butter. Nondairy cream substitutes. Salad dressings with cheese or sour cream. Beverages Regular sodas and any drinks with added sugar. Sweets and desserts Frosting. Pudding. Cookies. Cakes. Pies. Milk chocolate or white chocolate. Buttered syrups. Full-fat ice cream or ice cream drinks. The items listed above may not be a complete list of foods and beverages to avoid. Contact a dietitian for more information. Summary Heart-healthy meal planning includes limiting unhealthy fats, increasing healthy fats, limiting salt (sodium) intake and making  other diet and lifestyle changes. Lose weight if you are overweight. Losing just 5-10% of your body weight can help your overall health and prevent diseases such as diabetes and heart disease. Focus on eating a balance of foods, including fruits and vegetables, low-fat or nonfat dairy, lean protein, nuts and legumes, whole grains, and heart-healthy oils and fats. This information is not intended to replace advice given to you by your health care provider. Make sure you discuss any questions you have with your health care provider. Document Revised: 11/08/2021 Document Reviewed: 11/08/2021 Elsevier Patient Education  2023  [employer].      Signed, Sharlene Dory, NP  08/29/2022 9:38 AM    [facility]

## 2022-08-29 ENCOUNTER — Encounter: Payer: Self-pay | Admitting: Physician Assistant

## 2022-08-29 ENCOUNTER — Ambulatory Visit: Payer: Commercial Managed Care - PPO | Attending: Physician Assistant | Admitting: Nurse Practitioner

## 2022-08-29 VITALS — BP 135/80 | HR 69 | Ht 64.0 in | Wt 245.2 lb

## 2022-08-29 DIAGNOSIS — G4733 Obstructive sleep apnea (adult) (pediatric): Secondary | ICD-10-CM

## 2022-08-29 DIAGNOSIS — R011 Cardiac murmur, unspecified: Secondary | ICD-10-CM

## 2022-08-29 DIAGNOSIS — E785 Hyperlipidemia, unspecified: Secondary | ICD-10-CM

## 2022-08-29 DIAGNOSIS — I10 Essential (primary) hypertension: Secondary | ICD-10-CM

## 2022-08-29 DIAGNOSIS — E66813 Obesity, class 3: Secondary | ICD-10-CM

## 2022-08-29 DIAGNOSIS — Z6841 Body Mass Index (BMI) 40.0 and over, adult: Secondary | ICD-10-CM

## 2022-08-29 DIAGNOSIS — I428 Other cardiomyopathies: Secondary | ICD-10-CM | POA: Diagnosis not present

## 2022-08-29 DIAGNOSIS — I77819 Aortic ectasia, unspecified site: Secondary | ICD-10-CM

## 2022-08-29 DIAGNOSIS — I38 Endocarditis, valve unspecified: Secondary | ICD-10-CM

## 2022-08-29 DIAGNOSIS — I272 Pulmonary hypertension, unspecified: Secondary | ICD-10-CM | POA: Diagnosis not present

## 2022-08-29 DIAGNOSIS — E0590 Thyrotoxicosis, unspecified without thyrotoxic crisis or storm: Secondary | ICD-10-CM

## 2022-08-29 DIAGNOSIS — I5032 Chronic diastolic (congestive) heart failure: Secondary | ICD-10-CM

## 2022-08-29 DIAGNOSIS — I2510 Atherosclerotic heart disease of native coronary artery without angina pectoris: Secondary | ICD-10-CM

## 2022-08-29 DIAGNOSIS — I509 Heart failure, unspecified: Secondary | ICD-10-CM

## 2022-08-29 NOTE — Patient Instructions (Addendum)
Medication Instructions:  No changes *If you need a refill on your cardiac medications before your next appointment, please call your pharmacy*   Lab Work: None ordered If you have labs (blood work) drawn today and your tests are completely normal, you will receive your results only by: [place] (if you have MyChart) OR A paper copy in the mail If you have any lab test that is abnormal or we need to change your treatment, we will call you to review the results.   Testing/Procedures: None ordered   Follow-Up: At [facility], you and your health needs are our priority.  As part of our continuing mission to provide you with exceptional heart care, we have created designated Provider Care Teams.  These Care Teams include your primary Cardiologist (physician) and Advanced Practice Providers (APPs -  Physician Assistants and Nurse Practitioners) who all work together to provide you with the care you need, when you need it.  We recommend signing up for the patient portal called "MyChart".  Sign up information is provided on this After Visit Summary.  MyChart is used to connect with patients for Virtual Visits (Telemedicine).  Patients are able to view lab/test results, encounter notes, upcoming appointments, etc.  Non-urgent messages can be sent to your provider as well.   To learn more about what you can do with MyChart, go to [email or web address].    Your next appointment:   2 month(s)  The format for your next appointment:   In Person  Provider:   You may see [name], MD or one of the following Advanced Practice Providers on your designated Care Team:   Murray Hodgkins, NP Christell Faith, PA-C Cadence Kathlen Mody, PA-C Gerrie Nordmann, NP    A referral has been placed to Pulmonary  Please check your blood pressure daily. If the top number consistently stays over 130 or 140, please call the office at [phone].  Keep a journal of these daily blood pressure and  heart rate readings and call our office or send a message through [place] with the results. Thank you!  It is best to check your BP 1-2 hours after taking your medications to see the medications effectiveness on your BP.    Here are some tips that our clinical pharmacists share for home BP monitoring:          Rest 10 minutes before taking your blood pressure.          Don't smoke or drink caffeinated beverages for at least 30 minutes before.          Take your blood pressure before (not after) you eat.          Sit comfortably with your back supported and both feet on the floor (don't cross your legs).          Elevate your arm to heart level on a table or a desk.          Use the proper sized cuff. It should fit smoothly and snugly around your bare upper arm. There should be enough room to slip a fingertip under the cuff. The bottom edge of the cuff should be 1 inch above the crease of the elbow.  Other Instructions Heart-Healthy Eating Plan Many factors influence your heart health, including eating and exercise habits. Heart health is also called coronary health. Coronary risk increases with abnormal blood fat (lipid) levels. A heart-healthy eating plan includes limiting unhealthy fats, increasing healthy fats, limiting salt (sodium) intake, and making  other diet and lifestyle changes. What is my plan? Your health care provider may recommend that: You limit your fat intake to _________% or less of your total calories each day. You limit your saturated fat intake to _________% or less of your total calories each day. You limit the amount of cholesterol in your diet to less than _________ mg per day. You limit the amount of sodium in your diet to less than _________ mg per day. What are tips for following this plan? Cooking Cook foods using methods other than frying. Baking, boiling, grilling, and broiling are all good options. Other ways to reduce fat include: Removing the skin from  poultry. Removing all visible fats from meats. Steaming vegetables in water or broth. Meal planning  At meals, imagine dividing your plate into fourths: Fill one-half of your plate with vegetables and green salads. Fill one-fourth of your plate with whole grains. Fill one-fourth of your plate with lean protein foods. Eat 2-4 cups of vegetables per day. One cup of vegetables equals 1 cup (91 g) broccoli or cauliflower florets, 2 medium carrots, 1 large bell pepper, 1 large sweet potato, 1 large tomato, 1 medium white potato, 2 cups (150 g) raw leafy greens. Eat 1-2 cups of fruit per day. One cup of fruit equals 1 small apple, 1 large banana, 1 cup (237 g) mixed fruit, 1 large orange,  cup (82 g) dried fruit, 1 cup (240 mL) 100% fruit juice. Eat more foods that contain soluble fiber. Examples include apples, broccoli, carrots, beans, peas, and barley. Aim to get 25-30 g of fiber per day. Increase your consumption of legumes, nuts, and seeds to 4-5 servings per week. One serving of dried beans or legumes equals  cup (90 g) cooked, 1 serving of nuts is  oz (12 almonds, 24 pistachios, or 7 walnut halves), and 1 serving of seeds equals  oz (8 g). Fats Choose healthy fats more often. Choose monounsaturated and polyunsaturated fats, such as olive and canola oils, avocado oil, flaxseeds, walnuts, almonds, and seeds. Eat more omega-3 fats. Choose salmon, mackerel, sardines, tuna, flaxseed oil, and ground flaxseeds. Aim to eat fish at least 2 times each week. Check food labels carefully to identify foods with trans fats or high amounts of saturated fat. Limit saturated fats. These are found in animal products, such as meats, butter, and cream. Plant sources of saturated fats include palm oil, palm kernel oil, and coconut oil. Avoid foods with partially hydrogenated oils in them. These contain trans fats. Examples are stick margarine, some tub margarines, cookies, crackers, and other baked goods. Avoid  fried foods. General information Eat more home-cooked food and less restaurant, buffet, and fast food. Limit or avoid alcohol. Limit foods that are high in added sugar and simple starches such as foods made using white refined flour (white breads, pastries, sweets). Lose weight if you are overweight. Losing just 5-10% of your body weight can help your overall health and prevent diseases such as diabetes and heart disease. Monitor your sodium intake, especially if you have high blood pressure. Talk with your health care provider about your sodium intake. Try to incorporate more vegetarian meals weekly. What foods should I eat? Fruits All fresh, canned (in natural juice), or frozen fruits. Vegetables Fresh or frozen vegetables (raw, steamed, roasted, or grilled). Green salads. Grains Most grains. Choose whole wheat and whole grains most of the time. Rice and pasta, including brown rice and pastas made with whole wheat. Meats and other proteins Lean,  well-trimmed beef, veal, pork, and lamb. Chicken and [place] without skin. All fish and shellfish. Wild duck, rabbit, pheasant, and venison. Egg whites or low-cholesterol egg substitutes. Dried beans, peas, lentils, and tofu. Seeds and most nuts. Dairy Low-fat or nonfat cheeses, including ricotta and mozzarella. Skim or 1% milk (liquid, powdered, or evaporated). Buttermilk made with low-fat milk. Nonfat or low-fat yogurt. Fats and oils Non-hydrogenated (trans-free) margarines. Vegetable oils, including soybean, sesame, sunflower, olive, avocado, peanut, safflower, corn, canola, and cottonseed. Salad dressings or mayonnaise made with a vegetable oil. Beverages Water (mineral or sparkling). Coffee and tea. Unsweetened ice tea. Diet beverages. Sweets and desserts Sherbet, gelatin, and fruit ice. Small amounts of dark chocolate. Limit all sweets and desserts. Seasonings and condiments All seasonings and condiments. The items listed above may not be a  complete list of foods and beverages you can eat. Contact a dietitian for more options. What foods should I avoid? Fruits Canned fruit in heavy syrup. Fruit in cream or butter sauce. Fried fruit. Limit coconut. Vegetables Vegetables cooked in cheese, cream, or butter sauce. Fried vegetables. Grains Breads made with saturated or trans fats, oils, or whole milk. Croissants. Sweet rolls. Donuts. High-fat crackers, such as cheese crackers and chips. Meats and other proteins Fatty meats, such as hot dogs, ribs, sausage, bacon, rib-eye roast or steak. High-fat deli meats, such as salami and bologna. Caviar. Domestic duck and goose. Organ meats, such as liver. Dairy Cream, sour cream, cream cheese, and creamed cottage cheese. Whole-milk cheeses. Whole or 2% milk (liquid, evaporated, or condensed). Whole buttermilk. Cream sauce or high-fat cheese sauce. Whole-milk yogurt. Fats and oils Meat fat, or shortening. Cocoa butter, hydrogenated oils, palm oil, coconut oil, palm kernel oil. Solid fats and shortenings, including bacon fat, salt pork, lard, and butter. Nondairy cream substitutes. Salad dressings with cheese or sour cream. Beverages Regular sodas and any drinks with added sugar. Sweets and desserts Frosting. Pudding. Cookies. Cakes. Pies. Milk chocolate or white chocolate. Buttered syrups. Full-fat ice cream or ice cream drinks. The items listed above may not be a complete list of foods and beverages to avoid. Contact a dietitian for more information. Summary Heart-healthy meal planning includes limiting unhealthy fats, increasing healthy fats, limiting salt (sodium) intake and making other diet and lifestyle changes. Lose weight if you are overweight. Losing just 5-10% of your body weight can help your overall health and prevent diseases such as diabetes and heart disease. Focus on eating a balance of foods, including fruits and vegetables, low-fat or nonfat dairy, lean protein, nuts and legumes,  whole grains, and heart-healthy oils and fats. This information is not intended to replace advice given to you by your health care provider. Make sure you discuss any questions you have with your health care provider. Document Revised: 11/08/2021 Document Reviewed: 11/08/2021 Elsevier Patient Education  [place].

## 2022-09-21 ENCOUNTER — Ambulatory Visit: Payer: Commercial Managed Care - PPO | Admitting: Student in an Organized Health Care Education/Training Program

## 2022-09-21 ENCOUNTER — Encounter: Payer: Self-pay | Admitting: Student in an Organized Health Care Education/Training Program

## 2022-09-21 VITALS — BP 128/78 | HR 96 | Temp 98.0°F | Ht 64.0 in | Wt 246.0 lb

## 2022-09-21 DIAGNOSIS — R0602 Shortness of breath: Secondary | ICD-10-CM

## 2022-09-21 DIAGNOSIS — I272 Pulmonary hypertension, unspecified: Secondary | ICD-10-CM | POA: Diagnosis not present

## 2022-09-21 NOTE — Patient Instructions (Addendum)
Today, I ordered blood work. You can get them draw at your preferred [facility] draw station. The nearest one to [facility] is at nearby [facility] ([address], [place], [place] [address]). 

## 2022-09-21 NOTE — Progress Notes (Signed)
Synopsis: Referred in for pulmonary hypertension by Amber Dory, NP  Assessment & Plan:   1. Shortness of breath 2. Pulmonary HTN (HCC)  She is presenting for the evaluation of shortness of breath in the setting of known CAD status post stenting and concern for pulmonary hypertension.  Right heart cath from 2020 was notable for PA mean of 28, PAOP of 12, transpulmonary gradient of 16, diastolic pulmonary gradient of 2, Fick cardiac output 8, pulmonary vascular resistance of 2 Wood units. This is overall consistent with precapillary pulmonary hypertension.  Repeat echocardiogram shows elevated RV systolic pressures which could overestimate her PA pressures.  The differential diagnosis for this includes WHO group 1 pulmonary hypertension (idiopathic, connective tissue disease, PVOD given previous groundglass opacities on CT from 2020), group 2 pulmonary hypertension, group 3 pulmonary hypertension (sleep apnea), group 4 pulm hypertension (CHF), and group 5 (history of thyroid disease).  I do believe that the patient would benefit from a repeat right heart cath to reevaluate her hemodynamics and remeasure her PA pressure.  I would also recommend vasoreactivity testing keeping in mind that should she develop heart failure with the test it would suggest PVOD.  I will also initiate a targeted workup for pulm hypertension to include a pulmonary function test, high-resolution CT scan of the chest, V/Q scan, and a sleep study.  I will send an autoimmune workup as well.  - NM Pulmonary Perf and Vent; Future - Pulmonary Function Test [facility] Only; Future - CT CHEST HIGH RESOLUTION; Future - Split night study; Future - ANCA Profile - ANA 12 Plus Profile (RDL) - Anti-CCP Ab, IgG + IgA (RDL) - Rheumatoid factor - Comprehensive metabolic panel - CBC with Differential/Platelet   Return in about 2 months (around 11/22/2022).  I spent 60 minutes caring for this patient today, including preparing to see  the patient, obtaining a medical history , reviewing a separately obtained history, performing a medically appropriate examination and/or evaluation, counseling and educating the patient/family/caregiver, ordering medications, tests, or procedures, referring and communicating with other health care professionals (not separately reported), documenting clinical information in the electronic health record, and independently interpreting results (not separately reported/billed) and communicating results to the patient/family/caregiver  Amber Chute, MD [facility] 09/21/2022 11:36 AM    End of visit medications:  No orders of the defined types were placed in this encounter.    Current Outpatient Medications:    aspirin 81 MG chewable tablet, Chew 1 tablet (81 mg total) by mouth daily., Disp: 30 tablet, Rfl: 11   atorvastatin (LIPITOR) 80 MG tablet, TAKE 1 TABLET BY MOUTH DAILY AT 6 PM., Disp: 90 tablet, Rfl: 0   calcium-vitamin D 250-100 MG-UNIT tablet, Take 1 tablet by mouth daily., Disp: , Rfl:    ezetimibe (ZETIA) 10 MG tablet, TAKE 1 TABLET BY MOUTH EVERY DAY, Disp: 30 tablet, Rfl: 6   ferrous sulfate 325 (65 FE) MG tablet, Take 325 mg by mouth 3 (three) times a week. No set days, Disp: , Rfl:    furosemide (LASIX) 20 MG tablet, TAKE 1 TABLET BY MOUTH EVERY DAY, Disp: 30 tablet, Rfl: 3   isosorbide mononitrate (IMDUR) 30 MG 24 hr tablet, TAKE 1/2 TABLET BY MOUTH DAILY, Disp: 45 tablet, Rfl: 2   losartan (COZAAR) 100 MG tablet, Take 1 tablet (100 mg total) by mouth daily. for blood pressure., Disp: 90 tablet, Rfl: 0   metFORMIN (GLUCOPHAGE-XR) 500 MG 24 hr tablet, TAKE 1 TABLET (500 MG TOTAL) BY MOUTH  DAILY WITH BREAKFAST. FOR DIABETES., Disp: 90 tablet, Rfl: 1   methimazole (TAPAZOLE) 5 MG tablet, Take 2 tablets (10 mg total) by mouth 2 (two) times daily., Disp: 360 tablet, Rfl: 2   metoprolol succinate (TOPROL-XL) 25 MG 24 hr tablet, TAKE 1 TABLET BY MOUTH EVERY DAY,  Disp: 90 tablet, Rfl: 0   spironolactone (ALDACTONE) 25 MG tablet, TAKE 1/2 TABLET BY MOUTH DAILY, Disp: 15 tablet, Rfl: 3   nitroGLYCERIN (NITROSTAT) 0.4 MG SL tablet, Place 1 tablet (0.4 mg total) under the tongue every 5 (five) minutes as needed for chest pain., Disp: 25 tablet, Rfl: 1   Subjective:   PATIENT ID: Amber [name] GENDER: female DOB: 01/06/1971, MRN: [id number]  Chief Complaint  Patient presents with   pulmonary consult    Echo 08/09/2022-SOB with exertion and dry cough at night.     HPI  Amber [name] is a pleasant 51 year old female presenting to clinic for the evaluation of shortness of breath.  She had been seen by cardiology and followed for the last 2 years after she was diagnosed with a myocardial infarction and heart failure.  She underwent left and right heart cath notable for pulmonary hypertension with the most recent echocardiogram showing elevated PA pressures.  She is referred to pulmonology to workup her pulmonary hypertension.  She reports shortness of breath that is exertional and happening on most days.  Some days feel worse than others.  She does not have any cough, sputum production, chest tightness, or chest pain. She denies any joint swelling, warmth, or pain.  She does not have any worsening lower extremity edema.  She was healthy in her youth but does tell me that she was told she had asthma when she was a child and fully outgrew it.  She did not have any other medical problems growing up. She reports snoring and could have sleep apnea.  She was most recently seen by cardiology on 08/29/2022. Her index presentation was in August of 2020 where she was managed for NSTEMI with imaging concerning for pulmonary edema. Her last TTE showed an estimated RV systolic pressure of 54. RHC from 2020 showed an RA 8, RV 36/6, PA 38/14 (28), PAOP 12, FICK CO 8.  She was born in the [place] where she lived until 2013 [place].  She is a non-smoker  and currently lives in an apartment complex.  She does not have any pets or birds.  Ancillary information including prior medications, full medical/surgical/family/social histories, and PFTs (when available) are listed below and have been reviewed.   Past medical history notable for NSTEMI, CAD (s/p PCI DES mid LAD (05/2019), HFrEF, HTN, T2DM, Hyperthyroidism, and Obesity.  ROS   Objective:   Vitals:   09/21/22 1106  BP: 128/78  Pulse: 96  Temp: 98 F (36.7 C)  TempSrc: Temporal  SpO2: 97%  Weight: 246 lb (111.6 kg)  Height: 5\' 4"  (1.626 m)   97% on RA  BMI Readings from Last 3 Encounters:  09/21/22 42.23 kg/m  08/29/22 42.09 kg/m  08/17/22 41.54 kg/m   Wt Readings from Last 3 Encounters:  09/21/22 246 lb (111.6 kg)  08/29/22 245 lb 3.2 oz (111.2 kg)  08/17/22 242 lb (109.8 kg)    Physical Exam    Ancillary Information    Past Medical History:  Diagnosis Date   Acute encephalopathy    Acute pulmonary edema (HCC)    Acute respiratory failure with hypoxia (HCC) 05/20/2019   Anemia  Aortic dilatation (HCC)    37 mm (ascending aorta) 07/2022   CAD (coronary artery disease)    s/p DES to mLAD in 2020, NSTEMI, HFrEF with improvement in EF.   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   Thyroid storm    Unexplained weight loss 03/27/2019   Valvular insufficiency    Mild MR mild TR noted on echocardiogram in October 2023.      Family History  Problem Relation Age of Onset   Diabetes Father    Diabetes Mother    Heart disease Mother    Diabetes Sister    Hypertension Sister      Past Surgical History:  Procedure Laterality Date   CARDIAC CATHETERIZATION     CESAREAN SECTION     x2   COLONOSCOPY N/A 06/24/2021   Procedure: COLONOSCOPY;  Surgeon: [name], MD;  Location: [facility] ENDOSCOPY;  Service: Gastroenterology;  Laterality: N/A;   CORONARY STENT INTERVENTION N/A 05/22/2019   Procedure: CORONARY STENT INTERVENTION;  Surgeon: Iran Ouch, MD;  Location: [facility] INVASIVE CV LAB;  Service: Cardiovascular;  Laterality: N/A;   MASS EXCISION Right 01/18/2013   Procedure: EXCISION right scapular cyst;  Surgeon: Axel Filler, MD;  Location: [facility] ORS;  Service: General;  Laterality: Right;   RIGHT/LEFT HEART CATH AND CORONARY ANGIOGRAPHY N/A 05/22/2019   Procedure: RIGHT/LEFT HEART CATH AND CORONARY ANGIOGRAPHY;  Surgeon: Iran Ouch, MD;  Location: [facility] INVASIVE CV LAB;  Service: Cardiovascular;  Laterality: N/A;    Social History   Socioeconomic History   Marital status: Married    Spouse name: Not on file   Number of children: 2   Years of education: Not on file   Highest education level: Not on file  Occupational History    Comment: front desk   Tobacco Use   Smoking status: Never   Smokeless tobacco: Never  Vaping Use   Vaping Use: Never used  Substance and Sexual Activity   Alcohol use: Yes    Alcohol/week: 0.0 standard drinks of alcohol    Comment: rare   Drug use: No   Sexual activity: Not Currently  Other Topics Concern   Not on file  Social History Narrative   From [place].   Married.   2 children.   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   Social Determinants of Health   Financial Resource Strain: Not on file  Food Insecurity: Not on file  Transportation Needs: Not on file  Physical Activity: Not on file  Stress:  Not on file  Social Connections: Not on file  Intimate Partner Violence: Not on file     No Known Allergies   CBC    Component Value Date/Time   WBC 10.6 (H) 05/04/2022 1216   RBC 5.02 05/04/2022 1216   HGB 12.6 05/04/2022 1216   HGB  13.5 02/12/2014 0808   HCT 36.7 05/04/2022 1216   HCT 38.2 02/12/2014 0808   PLT 313 05/04/2022 1216   PLT 351 02/12/2014 0808   MCV 73.1 (L) 05/04/2022 1216   MCV 77.0 (L) 02/12/2014 0808   MCH 25.1 (L) 05/04/2022 1216   MCHC 34.3 05/04/2022 1216   RDW 14.2 05/04/2022 1216   RDW 15.0 (H) 02/12/2014 0808   LYMPHSABS 4.4 (H) 05/20/2021 0916   LYMPHSABS 4.4 (H) 02/12/2014 0808   MONOABS 0.8 05/20/2021 0916   MONOABS 1.0 (H) 02/12/2014 0808   EOSABS 0.2 05/20/2021 0916   EOSABS 0.2 02/12/2014 0808   BASOSABS 0.1 05/20/2021 0916   BASOSABS 0.1 02/12/2014 0808    Pulmonary Functions Testing Results:     No data to display          Outpatient Medications Prior to Visit  Medication Sig Dispense Refill   aspirin 81 MG chewable tablet Chew 1 tablet (81 mg total) by mouth daily. 30 tablet 11   atorvastatin (LIPITOR) 80 MG tablet TAKE 1 TABLET BY MOUTH DAILY AT 6 PM. 90 tablet 0   calcium-vitamin D 250-100 MG-UNIT tablet Take 1 tablet by mouth daily.     ezetimibe (ZETIA) 10 MG tablet TAKE 1 TABLET BY MOUTH EVERY DAY 30 tablet 6   ferrous sulfate 325 (65 FE) MG tablet Take 325 mg by mouth 3 (three) times a week. No set days     furosemide (LASIX) 20 MG tablet TAKE 1 TABLET BY MOUTH EVERY DAY 30 tablet 3   isosorbide mononitrate (IMDUR) 30 MG 24 hr tablet TAKE 1/2 TABLET BY MOUTH DAILY 45 tablet 2   losartan (COZAAR) 100 MG tablet Take 1 tablet (100 mg total) by mouth daily. for blood pressure. 90 tablet 0   metFORMIN (GLUCOPHAGE-XR) 500 MG 24 hr tablet TAKE 1 TABLET (500 MG TOTAL) BY MOUTH DAILY WITH BREAKFAST. FOR DIABETES. 90 tablet 1   methimazole (TAPAZOLE) 5 MG tablet Take 2 tablets (10 mg total) by mouth 2 (two) times daily. 360 tablet 2    metoprolol succinate (TOPROL-XL) 25 MG 24 hr tablet TAKE 1 TABLET BY MOUTH EVERY DAY 90 tablet 0   spironolactone (ALDACTONE) 25 MG tablet TAKE 1/2 TABLET BY MOUTH DAILY 15 tablet 3   nitroGLYCERIN (NITROSTAT) 0.4 MG SL tablet Place 1 tablet (0.4 mg total) under the tongue every 5 (five) minutes as needed for chest pain. 25 tablet 1   No facility-administered medications prior to visit.

## 2022-09-24 ENCOUNTER — Other Ambulatory Visit: Payer: Self-pay | Admitting: Cardiovascular Disease

## 2022-09-29 ENCOUNTER — Telehealth: Payer: Self-pay | Admitting: *Deleted

## 2022-09-29 NOTE — Telephone Encounter (Signed)
Appointment held for today.

## 2022-09-29 NOTE — Telephone Encounter (Signed)
Left voicemail message to call back  

## 2022-09-29 NOTE — Telephone Encounter (Signed)
Spoke with patient and due to work she can not come in any sooner than 12/28. Scheduled her to come in on that day to discuss further testing. She verbalized understanding, confirmed appointment, and had no further questions at this time.

## 2022-09-29 NOTE — Telephone Encounter (Signed)
Left voicemail message to call back in order to schedule appointment for today if possible.

## 2022-09-29 NOTE — Telephone Encounter (Signed)
-----   Message from Sharlene Dory, NP sent at 09/26/2022  1:08 PM EST ----- Regarding: FW: Pulm HTN Hello,   I saw this pt in [place] 1 month ago. She has seen Lung Doc who's recommending a RHC... for pulm htn. Can we please arrange a follow up with APP to talk about this? She has an appt with Alycia Rossetti next month but I would like to bring her back sooner within the next week or two to discuss this and get this set up please.   Thanks!   Best,  Sharlene Dory, NP   ----- Message ----- From: Raechel Chute, MD Sent: 09/21/2022   3:26 PM EST To: Iran Ouch, MD; Sharlene Dory, NP Subject: Pulm HTN                                       Hello - I am reaching out regarding this patient that I saw today referred for workup of pulmonary hypertension.   I reviewed her previous right heart cath from 2020 and it suggests precapillary pulm hypertension. Given the finding on the echo and the previous RHC, the differential is quite broad. I think there might be more at play that sleep apnea. She could benefit from a repeat RHC to re-measure her hemodynamics, confirm the diagnosis, and potentially do vaso-reactivity testing and I was wondering if you thought this was appropriate as well. I have initiated the rest of workup (V/Q, sleep study, PFT's, high res CT, connective tissue disease, etc...).  Thanks, Glenice Laine, MD [facility]

## 2022-10-04 ENCOUNTER — Ambulatory Visit: Payer: Commercial Managed Care - PPO

## 2022-10-04 ENCOUNTER — Ambulatory Visit
Admission: RE | Admit: 2022-10-04 | Discharge: 2022-10-04 | Disposition: A | Discharge status: Home / Self Care | Payer: Commercial Managed Care - PPO | Source: Ambulatory Visit | Attending: Student in an Organized Health Care Education/Training Program | Admitting: Student in an Organized Health Care Education/Training Program

## 2022-10-04 ENCOUNTER — Encounter
Admission: RE | Admit: 2022-10-04 | Discharge: 2022-10-04 | Disposition: A | Discharge status: Home / Self Care | Payer: Commercial Managed Care - PPO | Source: Ambulatory Visit | Attending: Student in an Organized Health Care Education/Training Program | Admitting: Student in an Organized Health Care Education/Training Program

## 2022-10-04 DIAGNOSIS — I272 Pulmonary hypertension, unspecified: Secondary | ICD-10-CM | POA: Insufficient documentation

## 2022-10-04 DIAGNOSIS — R0602 Shortness of breath: Secondary | ICD-10-CM | POA: Insufficient documentation

## 2022-10-04 MED ORDER — TECHNETIUM TO 99M ALBUMIN AGGREGATED
4.2400 | Freq: Once | INTRAVENOUS | Status: AC | PRN
  Administered 2022-10-04: 4.24 via INTRAVENOUS

## 2022-10-06 LAB — ANA 12 PLUS PROFILE, POSITIVE
Anti-Cardiolipin Ab, IgA (RDL): <12 APL U/mL (ref <12)
Anti-Cardiolipin Ab, IgG (RDL): <15 GPL U/mL (ref <15)
Anti-Cardiolipin Ab, IgM (RDL): <13 MPL U/mL (ref <13)
Anti-Centromere Ab (RDL): <1:40 {titer}
Anti-Chromatin Ab, IgG (RDL): <20 Units (ref <20)
Anti-La (SS-B) Ab (RDL): <20 Units (ref <20)
Anti-Ro (SS-A) Ab (RDL): <20 Units (ref <20)
Anti-Scl-70 Ab (RDL): <20 Units (ref <20)
Anti-Sm Ab (RDL): <20 Units (ref <20)
Anti-TPO Ab (RDL): 1240.1 IU/mL — ABNORMAL HIGH (ref <9.0)
Anti-U1 RNP Ab (RDL): <20 Units (ref <20)
Anti-dsDNA Ab by Farr(RDL): <8 IU/mL (ref <8.0)
C3 Complement (RDL): 188 mg/dL — ABNORMAL HIGH (ref 82–167)
C4 Complement (RDL): 27 mg/dL (ref 14–44)
Speckled Pattern: 1:160 {titer} — ABNORMAL HIGH

## 2022-10-06 LAB — CBC WITH DIFFERENTIAL/PLATELET
Basophils Absolute: 0.1 10*3/uL (ref 0.0–0.2)
Basos: 1 %
EOS (ABSOLUTE): 0.1 10*3/uL (ref 0.0–0.4)
Eos: 1 %
Hematocrit: 38.4 % (ref 34.0–46.6)
Hemoglobin: 13.4 g/dL (ref 11.1–15.9)
Immature Grans (Abs): 0 10*3/uL (ref 0.0–0.1)
Immature Granulocytes: 0 %
Lymphocytes Absolute: 4.2 10*3/uL — ABNORMAL HIGH (ref 0.7–3.1)
Lymphs: 33 %
MCH: 27.2 pg (ref 26.6–33.0)
MCHC: 34.9 g/dL (ref 31.5–35.7)
MCV: 78 fL — ABNORMAL LOW (ref 79–97)
Monocytes Absolute: 1 10*3/uL — ABNORMAL HIGH (ref 0.1–0.9)
Monocytes: 8 %
Neutrophils Absolute: 7.3 10*3/uL — ABNORMAL HIGH (ref 1.4–7.0)
Neutrophils: 57 %
Platelets: 301 10*3/uL (ref 150–450)
RBC: 4.93 x10E6/uL (ref 3.77–5.28)
RDW: 15.1 % (ref 11.7–15.4)
WBC: 12.7 10*3/uL — ABNORMAL HIGH (ref 3.4–10.8)

## 2022-10-06 LAB — COMPREHENSIVE METABOLIC PANEL
ALT: 18 IU/L (ref 0–32)
AST: 14 IU/L (ref 0–40)
Albumin/Globulin Ratio: 1.4 (ref 1.2–2.2)
Albumin: 4.4 g/dL (ref 3.8–4.9)
Alkaline Phosphatase: 97 IU/L (ref 44–121)
BUN/Creatinine Ratio: 13 (ref 9–23)
BUN: 8 mg/dL (ref 6–24)
Bilirubin Total: 0.4 mg/dL (ref 0.0–1.2)
CO2: 22 mmol/L (ref 20–29)
Calcium: 9.1 mg/dL (ref 8.7–10.2)
Chloride: 104 mmol/L (ref 96–106)
Creatinine, Ser: 0.6 mg/dL (ref 0.57–1.00)
Globulin, Total: 3.2 g/dL (ref 1.5–4.5)
Glucose: 123 mg/dL — ABNORMAL HIGH (ref 70–99)
Potassium: 3.9 mmol/L (ref 3.5–5.2)
Sodium: 141 mmol/L (ref 134–144)
Total Protein: 7.6 g/dL (ref 6.0–8.5)
eGFR: 108 mL/min/{1.73_m2} (ref >59)

## 2022-10-06 LAB — RHEUMATOID FACTOR: Rheumatoid fact SerPl-aCnc: 14.6 IU/mL — ABNORMAL HIGH (ref <14.0)

## 2022-10-06 LAB — ANCA PROFILE
Anti-MPO Antibodies: <0.2 units (ref 0.0–0.9)
Anti-PR3 Antibodies: <0.2 units (ref 0.0–0.9)
Atypical pANCA: <1:20 {titer}
C-ANCA: <1:20 {titer}
P-ANCA: <1:20 {titer}

## 2022-10-06 LAB — ANA 12 PLUS PROFILE (RDL): Anti-Nuclear Ab by IFA (RDL): POSITIVE — AB

## 2022-10-06 LAB — ANTI-CCP AB, IGG + IGA (RDL): Anti-CCP Ab, IgG + IgA (RDL): <20 Units (ref <20)

## 2022-10-10 ENCOUNTER — Other Ambulatory Visit: Payer: Self-pay | Admitting: Cardiovascular Disease

## 2022-10-11 NOTE — Progress Notes (Unsigned)
Cardiology Clinic Note   Patient Name: Amber [name] Date of Encounter: 10/13/2022  Primary Care Provider:  Doreene Nest, NP Primary Cardiologist:  Lorine Bears, MD  Patient Profile    51 year old female with a history of hypertension, CAD status post PCI/DES to LAD in 05/2019, HFrEF (mixed NICM/NICM with improved EF), hypothyroidism and previous thyroid storm, anemia, obesity, who presents for follow-up of shortness of breath and dyspnea on exertion.  Past Medical History    Past Medical History:  Diagnosis Date   Acute encephalopathy    Acute pulmonary edema (HCC)    Acute respiratory failure with hypoxia (HCC) 05/20/2019   Anemia    Aortic dilatation (HCC)    37 mm (ascending aorta) 07/2022   CAD (coronary artery disease)    s/p DES to mLAD in 2020, NSTEMI, HFrEF with improvement in EF.   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   Thyroid storm    Unexplained weight loss 03/27/2019   Valvular insufficiency    Mild MR mild TR noted on echocardiogram in October 2023.   Past Surgical History:  Procedure Laterality Date   CARDIAC CATHETERIZATION     CESAREAN SECTION     x2   COLONOSCOPY N/A 06/24/2021   Procedure: COLONOSCOPY;  Surgeon: [name], MD;  Location: [facility] ENDOSCOPY;  Service: Gastroenterology;  Laterality: N/A;   CORONARY STENT INTERVENTION N/A 05/22/2019   Procedure: CORONARY STENT INTERVENTION;  Surgeon: Iran Ouch, MD;  Location: [facility] INVASIVE CV LAB;  Service: Cardiovascular;  Laterality: N/A;   MASS EXCISION Right 01/18/2013   Procedure: EXCISION right scapular cyst;  Surgeon: Axel Filler, MD;  Location: [facility] ORS;  Service: General;  Laterality: Right;   RIGHT/LEFT HEART CATH AND CORONARY ANGIOGRAPHY N/A 05/22/2019   Procedure: RIGHT/LEFT HEART CATH AND CORONARY ANGIOGRAPHY;  Surgeon: Iran Ouch, MD;  Location: [facility] INVASIVE CV LAB;  Service: Cardiovascular;  Laterality: N/A;    Allergies  No Known Allergies  History of Present Illness    Amber [name] is a 51 year old female with previously mentioned past medical history of hypertension, CAD status post PCI/DES to LAD in 05/2019, HFrEF ( NICM/ICM with improved EF), hypothyroidism with previous thyroid storm, anemia, and continued shortness of breath and dyspnea on exertion.  She was hospitalized thousand 2020 with severe hypothyroidism.  She presented with acute respiratory failure.  Coagulation result consistent with pulmonary edema.  Echocardiogram revealed EF of 25-30% with wall motion abnormalities suggestive of stress-induced cardiomyopathy versus anterior infarct.  She underwent right and left heart catheterization revealed two-vessel coronary artery disease with 80% stenosis in the mid LAD, occluded OM branches and moderate RCA disease.  PCI/DES to the mid LAD.  She was treated for hypothyroidism and GDMT was optimized.  Previous echocardiogram revealed improvement in EF to 40/45%.  In 2021 she was hospitalized with atypical chest pain ruled out for myocardial infarction.  Lexiscan MPI showed evidence of prior anterior infarct without ischemia, EF 50-55% without significant valvular abnormalities on echocardiogram.  She was last seen in clinic 09/05/2022 by E. Philis Nettle, NP with complaints of some shortness of breath with walking or with exertion.  Tingling to the left side of the face that occurred for 2 days without recurrence since that.   She was referred to pulmonary for PFTs possible repeat sleep study for untreated OSA.  She was also scheduled for repeat echocardiogram within the next few months.  She also followed up with pulmonary on 09/21/2022 the patient would benefit from repeat right heart catheterization to reevaluate hemodynamics and measure PA pressures.  She was also scheduled for PFT is, CT of the chest high-resolution autoimmune workup, split-night study.  She returns to clinic today accompanied by her daughter. Continues to endorse shortness of breath and dyspnea on exertion. She has recently followed up with pulmonary who recommends a right heart catheterization to better understand hemodynamics and to assist with treatment options for pulmonary hypertension. She denies chest pain, palpitations, peripheral edema, lightheadedness, or dizziness. She denies any recent hospitalizations or visits to the emergency department.   Home Medications    Current Outpatient Medications  Medication Sig Dispense Refill   aspirin 81 MG chewable tablet Chew 1 tablet (81 mg total) by mouth daily. 30 tablet 11   atorvastatin (LIPITOR) 80 MG tablet TAKE 1 TABLET BY MOUTH DAILY AT 6 PM. 90 tablet 0   calcium-vitamin D 250-100 MG-UNIT tablet Take 1 tablet by mouth daily.     ezetimibe (ZETIA) 10 MG tablet TAKE 1 TABLET BY MOUTH EVERY DAY 30 tablet 6   ferrous sulfate 325 (65 FE) MG tablet Take 325 mg by mouth 3 (three) times a week. No set days     furosemide (LASIX) 20 MG tablet TAKE 1 TABLET BY MOUTH EVERY DAY 30 tablet 3   isosorbide mononitrate (IMDUR) 30 MG 24 hr tablet TAKE 1/2 TABLET BY MOUTH DAILY 45 tablet 2   losartan (COZAAR) 100 MG tablet Take 1 tablet (100 mg total) by mouth daily. for blood pressure. 90 tablet 0   metFORMIN (GLUCOPHAGE-XR) 500 MG 24 hr tablet TAKE 1 TABLET (500 MG TOTAL) BY MOUTH DAILY WITH BREAKFAST. FOR DIABETES. 90 tablet 1   methimazole (TAPAZOLE) 5 MG tablet Take 2 tablets (10 mg total) by mouth 2 (two)  times daily. 360 tablet 2   metoprolol succinate (TOPROL-XL) 25 MG 24 hr tablet TAKE 1 TABLET BY MOUTH EVERY DAY 90 tablet 0   spironolactone (ALDACTONE) 25 MG tablet TAKE 1/2 TABLET BY MOUTH DAILY 15 tablet 3   nitroGLYCERIN (NITROSTAT) 0.4 MG SL tablet Place 1 tablet (0.4 mg total) under the tongue every 5 (five) minutes as needed for chest pain. 25 tablet 1   No current facility-administered medications for this visit.     Family History    Family History  Problem Relation Age of Onset   Diabetes Father    Diabetes Mother    Heart disease Mother    Diabetes Sister    Hypertension Sister    She indicated that her mother is deceased. She indicated that her father is deceased. She indicated that her sister is alive. She indicated that her brother is alive.  Social History    Social History   Socioeconomic History   Marital status: Married    Spouse name: Not on file   Number of children: 2   Years of education: Not on file  Highest education level: Not on file  Occupational History    Comment: front desk   Tobacco Use   Smoking status: Never   Smokeless tobacco: Never  Vaping Use   Vaping Use: Never used  Substance and Sexual Activity   Alcohol use: Yes    Alcohol/week: 0.0 standard drinks of alcohol    Comment: rare   Drug use: No   Sexual activity: Not Currently  Other Topics Concern   Not on file  Social History Narrative   From [place].   Married.   2 children.   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   Social Determinants of Health   Financial Resource Strain: Not on file  Food Insecurity: Not on file  Transportation Needs: Not on file  Physical Activity: Not on file  Stress: Not on file  Social Connections: Not on file  Intimate Partner Violence: Not on file     Review of Systems    General:  No chills, fever, night sweats or weight changes. Endorses fatigue Cardiovascular:  No chest  pain, endorses dyspnea on exertion, and denies edema, orthopnea, palpitations, paroxysmal nocturnal dyspnea. Dermatological: No rash, lesions/masses Respiratory: No cough, endorses dyspnea Urologic: No hematuria, dysuria Abdominal:   No nausea, vomiting, diarrhea, bright red blood per rectum, melena, or hematemesis Neurologic:  No visual changes, wkns, changes in mental status. All other systems reviewed and are otherwise negative except as noted above.   Physical Exam    VS:  BP (!) 154/78 (BP Location: Left Arm, Patient Position: Sitting, Cuff Size: Large)   Pulse 63   Ht 5\' 4"  (1.626 m)   Wt 245 lb 9.6 oz (111.4 kg)   LMP 05/19/2021 (Approximate)   SpO2 95%   BMI 42.16 kg/m  , BMI Body mass index is 42.16 kg/m.     Vitals:   10/13/22 1049 10/13/22 1100  BP: (!) 162/95 (!) 154/78    GEN: Well nourished, well developed, in no acute distress. HEENT: normal. Neck: Supple, no JVD, carotid bruits, or masses. Cardiac: RRR, II/VI systolic murmur, without rubs, or gallops. No clubbing, cyanosis, edema.  Radials 2+/PT 2+ and equal bilaterally.  Respiratory:  Respirations regular and unlabored, clear to auscultation bilaterally. GI: Soft, nontender, nondistended, BS + x 4. MS: no deformity or atrophy. Skin: warm and dry, no rash. Neuro:  Strength and sensation are intact. Psych: Normal affect.  Accessory Clinical Findings    ECG personally reviewed by me today- sinus rate of 63, LVH, left axis deviation - No acute changes  Lab Results  Component Value Date   WBC 12.7 (H) 09/21/2022   HGB 13.4 09/21/2022   HCT 38.4 09/21/2022   MCV 78 (L) 09/21/2022   PLT 301 09/21/2022   Lab Results  Component Value Date   CREATININE 0.60 09/21/2022   BUN 8 09/21/2022   NA 141 09/21/2022   K 3.9 09/21/2022   CL 104 09/21/2022   CO2 22 09/21/2022   Lab Results  Component Value Date   ALT 18 09/21/2022   AST 14 09/21/2022   ALKPHOS 97 09/21/2022   BILITOT 0.4 09/21/2022   Lab  Results  Component Value Date   CHOL 153 08/17/2022   HDL 44.90 08/17/2022   LDLCALC 84 08/17/2022   TRIG 120.0 08/17/2022   CHOLHDL 3 08/17/2022    Lab Results  Component Value Date   HGBA1C 6.8 (H) 08/17/2022    Assessment & Plan  1.  Pulmonary hypertension with moderately elevated PASP noted on 2D 07/2022.  She was recently followed up with pulmonary.  She has not had PFTs scheduled, sleep study, and several other study modalities by pulmonary.  She is also being scheduled today for right heart catheterization to better understand hemodynamics of her pulmonary hypertension to facilitate appropriate treatment.  Previous left heart catheterization was done in 2020 that was notable for a PA mean of 28, PA OP of 12, transpulmonary gradient of 16, diastolic pulmonary gradient of 2, Fick output of 8.  2.  HFimpEF, mixed ischemic and nonischemic cardiomyopathy.  She underwent Myoview in 2021 which revealed no ischemia, there were findings consistent with prior MI, with EF of 55%, recent TTE revealed EF 55 to 60%, no RWMA, moderately elevated pulmonary artery systolic pressure. She has continued shortness of breath and dyspnea on exertion.  She appears to be euvolemic and well compensated on exam.  She has been continued on aspirin, atorvastatin, ezetimibe and, furosemide, Imdur, losartan, Toprol-XL, and Aldactone.  Has been encouraged to continue with low-sodium diet and fluid restriction and to weigh daily.  3.  Coronary artery disease status post DES to the mid LAD in 2020.  She currently denies chest pain.  Myoview in 2021 showed no evidence of previous.  She is stable with no current anginal symptoms or decompensation.  She is continued on aspirin, atorvastatin, ezetimibe, Imdur, and Nitrostat 0.4 mg sublingual as needed.  4.  Hypertension with blood pressure today 160/95 with repeat 134/78.  Patient is just taken his medications prior to coming to her appointment today.  She has been  encouraged to maintain systolic blood pressure less than 140 and diastolic less than 90 with a goal of staying under 130/80.  She has been encouraged to take her blood pressure at home as currently as she does not evaluate her blood pressure at home.  She has been continued on Imdur, furosemide, losartan, Toprol-XL, and spironolactone.  5.  Hyperlipidemia with recent LDL of 84, LDL goal is less than 70, but she has been continued on atorvastatin 80 mg daily and ezetimibe 10 mg daily.  6.  Thyroid disease with history of hyperthyroidism followed by endocrinology.  She is continued on methlmazole.  7.  Disposition patient return to clinic to see MD or APP 1 to 2 weeks postprocedure  Shared Decision Making/Informed Consent The risks [stroke (1 in 1000), death (1 in 1000), kidney failure [usually temporary] (1 in 500), bleeding (1 in 200), allergic reaction [possibly serious] (1 in 200)], benefits (diagnostic support and management of coronary artery disease) and alternatives of a cardiac catheterization were discussed in detail with Ms. Weimer and she is willing to proceed.   Khristie Sak, NP 10/13/2022, 12:37 PM

## 2022-10-11 NOTE — H&P (View-Only) (Signed)
Cardiology Clinic Note   Patient Name: Amber [name] Date of Encounter: 10/13/2022  Primary Care Provider:  Doreene Nest, NP Primary Cardiologist:  Lorine Bears, MD  Patient Profile    51 year old female with a history of hypertension, CAD status post PCI/DES to LAD in 05/2019, HFrEF (mixed NICM/NICM with improved EF), hypothyroidism and previous thyroid storm, anemia, obesity, who presents for follow-up of shortness of breath and dyspnea on exertion.  Past Medical History    Past Medical History:  Diagnosis Date   Acute encephalopathy    Acute pulmonary edema (HCC)    Acute respiratory failure with hypoxia (HCC) 05/20/2019   Anemia    Aortic dilatation (HCC)    37 mm (ascending aorta) 07/2022   CAD (coronary artery disease)    s/p DES to mLAD in 2020, NSTEMI, HFrEF with improvement in EF.   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   Thyroid storm    Unexplained weight loss 03/27/2019   Valvular insufficiency    Mild MR mild TR noted on echocardiogram in October 2023.   Past Surgical History:  Procedure Laterality Date   CARDIAC CATHETERIZATION     CESAREAN SECTION     x2   COLONOSCOPY N/A 06/24/2021   Procedure: COLONOSCOPY;  Surgeon: [name], MD;  Location: [facility] ENDOSCOPY;  Service: Gastroenterology;  Laterality: N/A;   CORONARY STENT INTERVENTION N/A 05/22/2019   Procedure: CORONARY STENT INTERVENTION;  Surgeon: Iran Ouch, MD;  Location: [facility] INVASIVE CV LAB;  Service: Cardiovascular;  Laterality: N/A;   MASS EXCISION Right 01/18/2013   Procedure: EXCISION right scapular cyst;  Surgeon: Axel Filler, MD;  Location: [facility] ORS;  Service: General;  Laterality: Right;   RIGHT/LEFT HEART CATH AND CORONARY ANGIOGRAPHY N/A 05/22/2019   Procedure: RIGHT/LEFT HEART CATH AND CORONARY ANGIOGRAPHY;  Surgeon: Iran Ouch, MD;  Location: [facility] INVASIVE CV LAB;  Service: Cardiovascular;  Laterality: N/A;    Allergies  No Known Allergies  History of Present Illness    Amber [name] is a 51 year old female with previously mentioned past medical history of hypertension, CAD status post PCI/DES to LAD in 05/2019, HFrEF ( NICM/ICM with improved EF), hypothyroidism with previous thyroid storm, anemia, and continued shortness of breath and dyspnea on exertion.  She was hospitalized thousand 2020 with severe hypothyroidism.  She presented with acute respiratory failure.  Coagulation result consistent with pulmonary edema.  Echocardiogram revealed EF of 25-30% with wall motion abnormalities suggestive of stress-induced cardiomyopathy versus anterior infarct.  She underwent right and left heart catheterization revealed two-vessel coronary artery disease with 80% stenosis in the mid LAD, occluded OM branches and moderate RCA disease.  PCI/DES to the mid LAD.  She was treated for hypothyroidism and GDMT was optimized.  Previous echocardiogram revealed improvement in EF to 40/45%.  In 2021 she was hospitalized with atypical chest pain ruled out for myocardial infarction.  Lexiscan MPI showed evidence of prior anterior infarct without ischemia, EF 50-55% without significant valvular abnormalities on echocardiogram.  She was last seen in clinic 09/05/2022 by E. Philis Nettle, NP with complaints of some shortness of breath with walking or with exertion.  Tingling to the left side of the face that occurred for 2 days without recurrence since that.   She was referred to pulmonary for PFTs possible repeat sleep study for untreated OSA.  She was also scheduled for repeat echocardiogram within the next few months.  She also followed up with pulmonary on 09/21/2022 the patient would benefit from repeat right heart catheterization to reevaluate hemodynamics and measure PA pressures.  She was also scheduled for PFT is, CT of the chest high-resolution autoimmune workup, split-night study.  She returns to clinic today accompanied by her daughter. Continues to endorse shortness of breath and dyspnea on exertion. She has recently followed up with pulmonary who recommends a right heart catheterization to better understand hemodynamics and to assist with treatment options for pulmonary hypertension. She denies chest pain, palpitations, peripheral edema, lightheadedness, or dizziness. She denies any recent hospitalizations or visits to the emergency department.   Home Medications    Current Outpatient Medications  Medication Sig Dispense Refill   aspirin 81 MG chewable tablet Chew 1 tablet (81 mg total) by mouth daily. 30 tablet 11   atorvastatin (LIPITOR) 80 MG tablet TAKE 1 TABLET BY MOUTH DAILY AT 6 PM. 90 tablet 0   calcium-vitamin D 250-100 MG-UNIT tablet Take 1 tablet by mouth daily.     ezetimibe (ZETIA) 10 MG tablet TAKE 1 TABLET BY MOUTH EVERY DAY 30 tablet 6   ferrous sulfate 325 (65 FE) MG tablet Take 325 mg by mouth 3 (three) times a week. No set days     furosemide (LASIX) 20 MG tablet TAKE 1 TABLET BY MOUTH EVERY DAY 30 tablet 3   isosorbide mononitrate (IMDUR) 30 MG 24 hr tablet TAKE 1/2 TABLET BY MOUTH DAILY 45 tablet 2   losartan (COZAAR) 100 MG tablet Take 1 tablet (100 mg total) by mouth daily. for blood pressure. 90 tablet 0   metFORMIN (GLUCOPHAGE-XR) 500 MG 24 hr tablet TAKE 1 TABLET (500 MG TOTAL) BY MOUTH DAILY WITH BREAKFAST. FOR DIABETES. 90 tablet 1   methimazole (TAPAZOLE) 5 MG tablet Take 2 tablets (10 mg total) by mouth 2 (two)  times daily. 360 tablet 2   metoprolol succinate (TOPROL-XL) 25 MG 24 hr tablet TAKE 1 TABLET BY MOUTH EVERY DAY 90 tablet 0   spironolactone (ALDACTONE) 25 MG tablet TAKE 1/2 TABLET BY MOUTH DAILY 15 tablet 3   nitroGLYCERIN (NITROSTAT) 0.4 MG SL tablet Place 1 tablet (0.4 mg total) under the tongue every 5 (five) minutes as needed for chest pain. 25 tablet 1   No current facility-administered medications for this visit.     Family History    Family History  Problem Relation Age of Onset   Diabetes Father    Diabetes Mother    Heart disease Mother    Diabetes Sister    Hypertension Sister    She indicated that her mother is deceased. She indicated that her father is deceased. She indicated that her sister is alive. She indicated that her brother is alive.  Social History    Social History   Socioeconomic History   Marital status: Married    Spouse name: Not on file   Number of children: 2   Years of education: Not on file  Highest education level: Not on file  Occupational History    Comment: front desk   Tobacco Use   Smoking status: Never   Smokeless tobacco: Never  Vaping Use   Vaping Use: Never used  Substance and Sexual Activity   Alcohol use: Yes    Alcohol/week: 0.0 standard drinks of alcohol    Comment: rare   Drug use: No   Sexual activity: Not Currently  Other Topics Concern   Not on file  Social History Narrative   From [place].   Married.   2 children.   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   Social Determinants of Health   Financial Resource Strain: Not on file  Food Insecurity: Not on file  Transportation Needs: Not on file  Physical Activity: Not on file  Stress: Not on file  Social Connections: Not on file  Intimate Partner Violence: Not on file     Review of Systems    General:  No chills, fever, night sweats or weight changes. Endorses fatigue Cardiovascular:  No chest  pain, endorses dyspnea on exertion, and denies edema, orthopnea, palpitations, paroxysmal nocturnal dyspnea. Dermatological: No rash, lesions/masses Respiratory: No cough, endorses dyspnea Urologic: No hematuria, dysuria Abdominal:   No nausea, vomiting, diarrhea, bright red blood per rectum, melena, or hematemesis Neurologic:  No visual changes, wkns, changes in mental status. All other systems reviewed and are otherwise negative except as noted above.   Physical Exam    VS:  BP (!) 154/78 (BP Location: Left Arm, Patient Position: Sitting, Cuff Size: Large)   Pulse 63   Ht 5\' 4"  (1.626 m)   Wt 245 lb 9.6 oz (111.4 kg)   LMP 05/19/2021 (Approximate)   SpO2 95%   BMI 42.16 kg/m  , BMI Body mass index is 42.16 kg/m.     Vitals:   10/13/22 1049 10/13/22 1100  BP: (!) 162/95 (!) 154/78    GEN: Well nourished, well developed, in no acute distress. HEENT: normal. Neck: Supple, no JVD, carotid bruits, or masses. Cardiac: RRR, II/VI systolic murmur, without rubs, or gallops. No clubbing, cyanosis, edema.  Radials 2+/PT 2+ and equal bilaterally.  Respiratory:  Respirations regular and unlabored, clear to auscultation bilaterally. GI: Soft, nontender, nondistended, BS + x 4. MS: no deformity or atrophy. Skin: warm and dry, no rash. Neuro:  Strength and sensation are intact. Psych: Normal affect.  Accessory Clinical Findings    ECG personally reviewed by me today- sinus rate of 63, LVH, left axis deviation - No acute changes  Lab Results  Component Value Date   WBC 12.7 (H) 09/21/2022   HGB 13.4 09/21/2022   HCT 38.4 09/21/2022   MCV 78 (L) 09/21/2022   PLT 301 09/21/2022   Lab Results  Component Value Date   CREATININE 0.60 09/21/2022   BUN 8 09/21/2022   NA 141 09/21/2022   K 3.9 09/21/2022   CL 104 09/21/2022   CO2 22 09/21/2022   Lab Results  Component Value Date   ALT 18 09/21/2022   AST 14 09/21/2022   ALKPHOS 97 09/21/2022   BILITOT 0.4 09/21/2022   Lab  Results  Component Value Date   CHOL 153 08/17/2022   HDL 44.90 08/17/2022   LDLCALC 84 08/17/2022   TRIG 120.0 08/17/2022   CHOLHDL 3 08/17/2022    Lab Results  Component Value Date   HGBA1C 6.8 (H) 08/17/2022    Assessment & Plan  1.  Pulmonary hypertension with moderately elevated PASP noted on 2D 07/2022.  She was recently followed up with pulmonary.  She has not had PFTs scheduled, sleep study, and several other study modalities by pulmonary.  She is also being scheduled today for right heart catheterization to better understand hemodynamics of her pulmonary hypertension to facilitate appropriate treatment.  Previous left heart catheterization was done in 2020 that was notable for a PA mean of 28, PA OP of 12, transpulmonary gradient of 16, diastolic pulmonary gradient of 2, Fick output of 8.  2.  HFimpEF, mixed ischemic and nonischemic cardiomyopathy.  She underwent Myoview in 2021 which revealed no ischemia, there were findings consistent with prior MI, with EF of 55%, recent TTE revealed EF 55 to 60%, no RWMA, moderately elevated pulmonary artery systolic pressure. She has continued shortness of breath and dyspnea on exertion.  She appears to be euvolemic and well compensated on exam.  She has been continued on aspirin, atorvastatin, ezetimibe and, furosemide, Imdur, losartan, Toprol-XL, and Aldactone.  Has been encouraged to continue with low-sodium diet and fluid restriction and to weigh daily.  3.  Coronary artery disease status post DES to the mid LAD in 2020.  She currently denies chest pain.  Myoview in 2021 showed no evidence of previous.  She is stable with no current anginal symptoms or decompensation.  She is continued on aspirin, atorvastatin, ezetimibe, Imdur, and Nitrostat 0.4 mg sublingual as needed.  4.  Hypertension with blood pressure today 160/95 with repeat 134/78.  Patient is just taken his medications prior to coming to her appointment today.  She has been  encouraged to maintain systolic blood pressure less than 140 and diastolic less than 90 with a goal of staying under 130/80.  She has been encouraged to take her blood pressure at home as currently as she does not evaluate her blood pressure at home.  She has been continued on Imdur, furosemide, losartan, Toprol-XL, and spironolactone.  5.  Hyperlipidemia with recent LDL of 84, LDL goal is less than 70, but she has been continued on atorvastatin 80 mg daily and ezetimibe 10 mg daily.  6.  Thyroid disease with history of hyperthyroidism followed by endocrinology.  She is continued on methlmazole.  7.  Disposition patient return to clinic to see MD or APP 1 to 2 weeks postprocedure  Shared Decision Making/Informed Consent The risks [stroke (1 in 1000), death (1 in 1000), kidney failure [usually temporary] (1 in 500), bleeding (1 in 200), allergic reaction [possibly serious] (1 in 200)], benefits (diagnostic support and management of coronary artery disease) and alternatives of a cardiac catheterization were discussed in detail with Amber [name] and she is willing to proceed.   Sabeen Piechocki, NP 10/13/2022, 12:37 PM

## 2022-10-13 ENCOUNTER — Encounter: Payer: Self-pay | Admitting: Cardiology

## 2022-10-13 ENCOUNTER — Ambulatory Visit: Payer: Commercial Managed Care - PPO | Attending: Cardiology | Admitting: Cardiology

## 2022-10-13 VITALS — BP 154/78 | HR 63 | Ht 64.0 in | Wt 245.6 lb

## 2022-10-13 DIAGNOSIS — I272 Pulmonary hypertension, unspecified: Secondary | ICD-10-CM | POA: Diagnosis not present

## 2022-10-13 DIAGNOSIS — E785 Hyperlipidemia, unspecified: Secondary | ICD-10-CM

## 2022-10-13 DIAGNOSIS — E0590 Thyrotoxicosis, unspecified without thyrotoxic crisis or storm: Secondary | ICD-10-CM

## 2022-10-13 DIAGNOSIS — I2510 Atherosclerotic heart disease of native coronary artery without angina pectoris: Secondary | ICD-10-CM

## 2022-10-13 DIAGNOSIS — I10 Essential (primary) hypertension: Secondary | ICD-10-CM | POA: Diagnosis not present

## 2022-10-13 DIAGNOSIS — I5022 Chronic systolic (congestive) heart failure: Secondary | ICD-10-CM

## 2022-10-13 MED ORDER — SODIUM CHLORIDE 0.9% FLUSH: 3.0000 mL | Freq: Two times a day (BID) | INTRAVENOUS | Status: AC

## 2022-10-13 NOTE — Addendum Note (Signed)
Addended byCharlsie Quest on: 10/13/2022 12:57 PM   Modules accepted: Orders

## 2022-10-13 NOTE — Patient Instructions (Addendum)
Medication Instructions:  Your physician recommends that you continue on your current medications as directed. Please refer to the Current Medication list given to you today.  *If you need a refill on your cardiac medications before your next appointment, please call your pharmacy*   Lab Work: No labs ordered  If you have labs (blood work) drawn today and your tests are completely normal, you will receive your results only by: MyChart Message (if you have MyChart) OR A paper copy in the mail If you have any lab test that is abnormal or we need to change your treatment, we will call you to review the results.   Testing/Procedures: [facility] A DEPT OF [place]. [facility] [place] A DEPT OF [place]. [facility] [address], [address] [place] [address] Dept: [phone] Loc: [phone]  You are scheduled for a Cardiac Catheterization on Wednesday, January 3 with Dr. Lorine Bears.  1. Please arrive at the [facility] entrance to check in at registration desk at 8:30 AM.    Free valet parking service is available.   Special note: Every effort is made to have your procedure done on time. Please understand that emergencies sometimes delay scheduled procedures.  2. Diet: Do not eat solid foods after midnight.  The patient may have clear liquids until 5am upon the day of the procedure.  3. Medication instructions in preparation for your procedure: Do not take Diabetes Med Glucophage (Metformin) on the day of the procedure and HOLD 48 HOURS AFTER THE PROCEDURE.  On the morning of your procedure, take your Aspirin 81 mg and any morning medicines NOT listed above.  You may use sips of water.  Do not take furosemide (lasix) the morning of the procedure.  5. Plan for one night stay--bring personal belongings. 6. Bring a current list of your medications and current insurance cards. 7. You MUST have a responsible  person to drive you home. 8. Someone MUST be with you the first 24 hours after you arrive home or your discharge will be delayed. 9. Please wear clothes that are easy to get on and off and wear slip-on shoes.  Follow-Up: At [facility], you and your health needs are our priority.  As part of our continuing mission to provide you with exceptional heart care, we have created designated Provider Care Teams.  These Care Teams include your primary Cardiologist (physician) and Advanced Practice Providers (APPs -  Physician Assistants and Nurse Practitioners) who all work together to provide you with the care you need, when you need it.  We recommend signing up for the patient portal called "MyChart".  Sign up information is provided on this After Visit Summary.  MyChart is used to connect with patients for Virtual Visits (Telemedicine).  Patients are able to view lab/test results, encounter notes, upcoming appointments, etc.  Non-urgent messages can be sent to your provider as well.   To learn more about what you can do with MyChart, go to [email or web address].    Your next appointment:   1-2 week(s)  The format for your next appointment:   In Person  Provider:   You may see Lorine Bears, MD or one of the following Advanced Practice Providers on your designated Care Team:   Nicolasa Ducking, NP Eula Listen, PA-C Cadence Fransico Michael, PA-C Charlsie Quest, NP   Important Information About Sugar

## 2022-10-19 ENCOUNTER — Other Ambulatory Visit: Payer: Self-pay

## 2022-10-19 ENCOUNTER — Ambulatory Visit: Payer: Commercial Managed Care - PPO | Attending: Otolaryngology

## 2022-10-19 ENCOUNTER — Encounter: Payer: Self-pay | Admitting: Internal Medicine

## 2022-10-19 ENCOUNTER — Ambulatory Visit
Admission: RE | Admit: 2022-10-19 | Discharge: 2022-10-19 | Disposition: A | Discharge status: Home / Self Care | Payer: Commercial Managed Care - PPO | Attending: Internal Medicine | Admitting: Internal Medicine

## 2022-10-19 ENCOUNTER — Encounter
Admission: RE | Disposition: A | Discharge status: Home / Self Care | Payer: Self-pay | Source: Home / Self Care | Attending: Internal Medicine

## 2022-10-19 DIAGNOSIS — I272 Pulmonary hypertension, unspecified: Secondary | ICD-10-CM | POA: Diagnosis present

## 2022-10-19 DIAGNOSIS — Z6841 Body Mass Index (BMI) 40.0 and over, adult: Secondary | ICD-10-CM | POA: Insufficient documentation

## 2022-10-19 DIAGNOSIS — I110 Hypertensive heart disease with heart failure: Secondary | ICD-10-CM | POA: Insufficient documentation

## 2022-10-19 DIAGNOSIS — R0602 Shortness of breath: Secondary | ICD-10-CM | POA: Insufficient documentation

## 2022-10-19 DIAGNOSIS — E785 Hyperlipidemia, unspecified: Secondary | ICD-10-CM | POA: Insufficient documentation

## 2022-10-19 DIAGNOSIS — Z955 Presence of coronary angioplasty implant and graft: Secondary | ICD-10-CM | POA: Diagnosis not present

## 2022-10-19 DIAGNOSIS — Z79890 Hormone replacement therapy (postmenopausal): Secondary | ICD-10-CM | POA: Insufficient documentation

## 2022-10-19 DIAGNOSIS — E669 Obesity, unspecified: Secondary | ICD-10-CM | POA: Diagnosis not present

## 2022-10-19 DIAGNOSIS — E039 Hypothyroidism, unspecified: Secondary | ICD-10-CM | POA: Diagnosis not present

## 2022-10-19 DIAGNOSIS — R0609 Other forms of dyspnea: Secondary | ICD-10-CM | POA: Insufficient documentation

## 2022-10-19 DIAGNOSIS — G4733 Obstructive sleep apnea (adult) (pediatric): Secondary | ICD-10-CM | POA: Insufficient documentation

## 2022-10-19 DIAGNOSIS — I5042 Chronic combined systolic (congestive) and diastolic (congestive) heart failure: Secondary | ICD-10-CM | POA: Diagnosis not present

## 2022-10-19 DIAGNOSIS — I255 Ischemic cardiomyopathy: Secondary | ICD-10-CM | POA: Insufficient documentation

## 2022-10-19 DIAGNOSIS — I2510 Atherosclerotic heart disease of native coronary artery without angina pectoris: Secondary | ICD-10-CM | POA: Insufficient documentation

## 2022-10-19 DIAGNOSIS — Z79899 Other long term (current) drug therapy: Secondary | ICD-10-CM | POA: Insufficient documentation

## 2022-10-19 HISTORY — PX: RIGHT HEART CATH: CATH118263

## 2022-10-19 LAB — POCT I-STAT EG7
Acid-Base Excess: 0 mmol/L (ref 0.0–2.0)
Bicarbonate: 25.7 mmol/L (ref 20.0–28.0)
Calcium, Ion: 1.22 mmol/L (ref 1.15–1.40)
HCT: 38 % (ref 36.0–46.0)
Hemoglobin: 12.9 g/dL (ref 12.0–15.0)
O2 Saturation: 75 %
Potassium: 3.7 mmol/L (ref 3.5–5.1)
Sodium: 142 mmol/L (ref 135–145)
TCO2: 27 mmol/L (ref 22–32)
pCO2, Ven: 45.4 mmHg (ref 44–60)
pH, Ven: 7.36 (ref 7.25–7.43)
pO2, Ven: 42 mmHg (ref 32–45)

## 2022-10-19 LAB — GLUCOSE, CAPILLARY: Glucose-Capillary: 140 mg/dL — ABNORMAL HIGH (ref 70–99)

## 2022-10-19 SURGERY — RIGHT HEART CATH
Anesthesia: Moderate Sedation | Laterality: Right

## 2022-10-19 MED ORDER — ASPIRIN 81 MG PO CHEW
CHEWABLE_TABLET | ORAL | Status: AC
  Filled 2022-10-19: qty 1

## 2022-10-19 MED ORDER — ONDANSETRON HCL 4 MG/2ML IJ SOLN: 4.0000 mg | Freq: Four times a day (QID) | INTRAMUSCULAR | Status: DC | PRN

## 2022-10-19 MED ORDER — FUROSEMIDE 40 MG PO TABS: 40.0000 mg | ORAL_TABLET | Freq: Every day | ORAL | 5 refills | Status: DC

## 2022-10-19 MED ORDER — HYDRALAZINE HCL 20 MG/ML IJ SOLN
INTRAMUSCULAR | Status: AC
  Filled 2022-10-19: qty 1

## 2022-10-19 MED ORDER — HEPARIN (PORCINE) IN NACL 1000-0.9 UT/500ML-% IV SOLN
INTRAVENOUS | Status: DC | PRN
  Administered 2022-10-19 (×2): 500 mL

## 2022-10-19 MED ORDER — SODIUM CHLORIDE 0.9% FLUSH: 3.0000 mL | INTRAVENOUS | Status: DC | PRN

## 2022-10-19 MED ORDER — SODIUM CHLORIDE 0.9 % IV SOLN: 250.0000 mL | INTRAVENOUS | Status: DC | PRN

## 2022-10-19 MED ORDER — ACETAMINOPHEN 325 MG PO TABS: 650.0000 mg | ORAL_TABLET | ORAL | Status: DC | PRN

## 2022-10-19 MED ORDER — SODIUM CHLORIDE 0.9% FLUSH: 3.0000 mL | Freq: Two times a day (BID) | INTRAVENOUS | Status: DC

## 2022-10-19 MED ORDER — SODIUM CHLORIDE 0.9 % IV SOLN: INTRAVENOUS | Status: DC

## 2022-10-19 MED ORDER — ASPIRIN 81 MG PO CHEW: 81.0000 mg | CHEWABLE_TABLET | ORAL | Status: DC

## 2022-10-19 MED ORDER — HEPARIN (PORCINE) IN NACL 1000-0.9 UT/500ML-% IV SOLN
INTRAVENOUS | Status: AC
  Filled 2022-10-19: qty 500

## 2022-10-19 MED ORDER — HYDRALAZINE HCL 20 MG/ML IJ SOLN
10.0000 mg | INTRAMUSCULAR | Status: DC | PRN
  Administered 2022-10-19: 10 mg via INTRAVENOUS

## 2022-10-19 SURGICAL SUPPLY — 5 items
CATH SWAN GANZ 7F STRAIGHT (CATHETERS) IMPLANT
DRAPE BRACHIAL (DRAPES) IMPLANT
GLIDESHEATH SLENDER 7FR .021G (SHEATH) IMPLANT
KIT RIGHT HEART ACIST (MISCELLANEOUS) IMPLANT
PACK CARDIAC CATH (CUSTOM PROCEDURE TRAY) ×1 IMPLANT

## 2022-10-19 NOTE — Interval H&P Note (Signed)
History and Physical Interval Note:  10/19/2022 9:49 AM  Amber [name]  has presented today for surgery, with the diagnosis of pulmonary hypertension.  The various methods of treatment have been discussed with the patient and family. After consideration of risks, benefits and other options for treatment, the patient has consented to  Procedure(s): RIGHT HEART CATH (Right) as a surgical intervention.  The patient's history has been reviewed, patient examined, no change in status, stable for surgery.  I have reviewed the patient's chart and labs.  Questions were answered to the patient's satisfaction.     [name]

## 2022-10-19 NOTE — Discharge Instructions (Signed)
Right Heart Cath, Care After This sheet gives you information about how to care for yourself after your procedure. Your health care provider may also give you more specific instructions. If you have problems or questions, contact your health care provider. What can I expect after the procedure? After the procedure, it is common to have: Bruising or mild discomfort in the area where the IV was inserted (insertion site). Follow these instructions at home: Eating and drinking  You may eat and drink after your procedure.  Drink a lot of fluids for the first several days after the procedure, as directed by your health care provider. This helps to wash (flush) the contrast out of your body. Examples of healthy fluids include water or low-calorie drinks. General instructions Check your IV insertion area and also your venous access site every day for signs of infection. Check for: Redness, swelling, or pain. Fluid or blood. Warmth. Pus or a bad smell. Take over-the-counter and prescription medicines only as told by your health care provider. Rest and return to your normal activities as told by your health care provider. Ask your health care provider what activities are safe for you. Do not drive for 24 hours if you were given a medicine to help you relax (sedative), or until your health care provider approves. Keep all follow-up visits as told by your health care provider. This is important. Contact a health care provider if: Your skin becomes itchy or you develop a rash or hives. You have a fever that does not get better with medicine. You feel nauseous. You vomit. You have redness, swelling, or pain around the insertion site. You have fluid or blood coming from the insertion site. Your insertion area feels warm to the touch. You have pus or a bad smell coming from the insertion site. Get help right away if: You have difficulty breathing or shortness of breath. You develop chest pain. You  faint. You feel very dizzy. These symptoms may represent a serious problem that is an emergency. Do not wait to see if the symptoms will go away. Get medical help right away. Call your local emergency services (911 in the [place] Do not drive yourself to the hospital. Summary After your procedure, it is common to have bruising or mild discomfort in the area where the IV was inserted. You should check your IV insertion area every day for signs of infection. Take over-the-counter and prescription medicines only as told by your health care provider. You should drink a lot of fluids for the first several days after the procedure to help flush the contrast from your body. This information is not intended to replace advice given to you by your health care provider. Make sure you discuss any questions you have with your health care provider. Document Released: 07/24/2013 Document Revised: 09/15/2017 Document Reviewed: 08/27/2016 Elsevier Patient Education  2020 [employer] 

## 2022-10-21 ENCOUNTER — Ambulatory Visit: Payer: Commercial Managed Care - PPO | Admitting: Physician Assistant

## 2022-10-24 ENCOUNTER — Other Ambulatory Visit: Payer: Self-pay | Admitting: Cardiovascular Disease

## 2022-10-31 ENCOUNTER — Ambulatory Visit: Payer: Commercial Managed Care - PPO | Admitting: Internal Medicine

## 2022-10-31 ENCOUNTER — Encounter: Payer: Self-pay | Admitting: Internal Medicine

## 2022-10-31 VITALS — BP 136/78 | HR 74 | Ht 64.0 in | Wt 245.0 lb

## 2022-10-31 DIAGNOSIS — E0500 Thyrotoxicosis with diffuse goiter without thyrotoxic crisis or storm: Secondary | ICD-10-CM

## 2022-10-31 DIAGNOSIS — E0590 Thyrotoxicosis, unspecified without thyrotoxic crisis or storm: Secondary | ICD-10-CM

## 2022-10-31 LAB — T4, FREE: Free T4: 1.08 ng/dL (ref 0.60–1.60)

## 2022-10-31 LAB — TSH: TSH: 0 u[IU]/mL — ABNORMAL LOW (ref 0.35–5.50)

## 2022-10-31 NOTE — Progress Notes (Unsigned)
Name: Amber [name]  MRN/ DOB: [id number], 1971/05/24    Age/ Sex: 52 y.o., female     PCP: [name], NP   Reason for Endocrinology Evaluation: Hyperthytroidism     Initial Endocrinology Clinic Visit: 04/03/2019    PATIENT IDENTIFIER: Amber [name] is a 52 y.o., female with a past medical history of HTN and CAD (S/P PCI 05/2019).She has followed with [facility] since 04/03/2019 for consultative assistance with management of her Hyperthyrodism  HISTORICAL SUMMARY: The patient was first diagnosed with hyperthyroidism in 03/2019, with a suppressed TSH at < 0.01 uIU/mL with elevated FT4 5.96 ng/dL. She did have weight loss  For ~ 4 months prior to her presentation.  Methimazole started in 03/2019  SUBJECTIVE:     Today (10/31/2022):  Amber [name] is here for a follow up on hyperthyroidism and Graves' disease.   She had a sleep study on 10/19/2022 She is s/p cardiac cath 10/19/2022   She has been noted with slight increase in weight  She has snoring  Denies palpitations  Denies constipation or diarrhea  No burning or itching in the eyes Denies  local neck swelling  Denies tremors   Last eye exam 02/2022  Methimazole 5 mg, 2 tabs  BID- she takes 1.5 tabs BID     HISTORY:  Past Medical History:  Past Medical History:  Diagnosis Date   Acute encephalopathy    Acute pulmonary edema (HCC)    Acute respiratory failure with hypoxia ([place]) 05/20/2019   Anemia    Aortic dilatation (HCC)    37 mm (ascending aorta) 07/2022   CAD (coronary artery disease)    s/p DES to mLAD in 2020, NSTEMI, HFrEF with improvement in EF.   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   Thyroid storm    Unexplained weight loss 03/27/2019   Valvular insufficiency    Mild MR mild TR noted on echocardiogram in October 2023.   Past Surgical History:  Past Surgical History:  Procedure Laterality Date   CARDIAC CATHETERIZATION     CESAREAN SECTION     x2   COLONOSCOPY N/A 06/24/2021   Procedure: COLONOSCOPY;  Surgeon: Jonathon Bellows, MD;  Location: [facility];  Service: Gastroenterology;  Laterality: N/A;   CORONARY STENT INTERVENTION N/A 05/22/2019   Procedure: CORONARY STENT INTERVENTION;  Surgeon: Wellington Hampshire, MD;  Location: [place] [facility];  Service: Cardiovascular;  Laterality: N/A;   MASS EXCISION Right 01/18/2013   Procedure: EXCISION right scapular cyst;  Surgeon: Ralene Ok, MD;  Location: [facility];  Service: General;  Laterality: Right;   RIGHT HEART CATH Right 10/19/2022   Procedure: RIGHT HEART CATH;  Surgeon: Nelva Bush, MD;  Location: [facility];  Service: Cardiovascular;  Laterality: Right;   RIGHT/LEFT HEART CATH AND CORONARY ANGIOGRAPHY N/A 05/22/2019   Procedure: RIGHT/LEFT HEART CATH AND CORONARY ANGIOGRAPHY;  Surgeon:  Iran Ouch, MD;  Location: [facility];  Service: Cardiovascular;  Laterality: N/A;   Social History:  reports that she has never smoked. She has never used smokeless tobacco. She reports current alcohol use. She reports that she does not use drugs. Family History:  Family History  Problem Relation Age of Onset   Diabetes Father    Diabetes Mother    Heart disease Mother    Diabetes Sister    Hypertension Sister      HOME MEDICATIONS: Allergies as of 10/31/2022   No Known Allergies      Medication List        Accurate as of October 31, 2022 10:32  AM. If you have any questions, ask your nurse or doctor.          aspirin 81 MG chewable tablet Chew 1 tablet (81 mg total) by mouth daily.   atorvastatin 80 MG tablet Commonly known as: LIPITOR TAKE 1 TABLET BY MOUTH DAILY AT 6 PM.   calcium-vitamin D 250-100 MG-UNIT tablet Take 1 tablet by mouth daily.   ezetimibe 10 MG tablet Commonly known as: ZETIA TAKE 1 TABLET BY MOUTH EVERY DAY   ferrous sulfate 325 (65 FE) MG tablet Take 325 mg by mouth 3 (three) times a week. No set days   furosemide 40 MG tablet Commonly known as: LASIX Take 1 tablet (40 mg total) by mouth daily.   isosorbide mononitrate 30 MG 24 hr tablet Commonly known as: IMDUR TAKE 1/2 TABLET BY MOUTH DAILY   losartan 100 MG tablet Commonly known as: COZAAR Take 1 tablet (100 mg total) by mouth daily. for blood pressure.   metFORMIN 500 MG 24 hr tablet Commonly known as: GLUCOPHAGE-XR TAKE 1 TABLET (500 MG TOTAL) BY MOUTH DAILY WITH BREAKFAST. FOR DIABETES.   methimazole 5 MG tablet Commonly known as: TAPAZOLE Take 2 tablets (10 mg total) by mouth 2 (two) times daily. What changed: how much to take   metoprolol succinate 25 MG 24 hr tablet Commonly known as: TOPROL-XL TAKE 1 TABLET BY MOUTH EVERY DAY   nitroGLYCERIN 0.4 MG SL tablet Commonly known as: Nitrostat Place 1 tablet (0.4 mg total) under the tongue every 5 (five) minutes as needed for chest pain.   spironolactone 25 MG tablet Commonly known as: ALDACTONE TAKE 1/2 TABLET BY MOUTH DAILY          OBJECTIVE:   PHYSICAL EXAM: VS: BP 136/78 (BP Location: Left Arm, Patient Position: Sitting, Cuff Size: Large)   Pulse 74   Ht 5\' 4"  (1.626 m)   Wt 245 lb (111.1 kg)   LMP 05/19/2021 (Approximate)   SpO2 96%   BMI 42.05 kg/m    EXAM: General: Pt appears well and is in NAD  Neck: General: Supple without adenopathy. Thyroid: Thyroid size is prominent.  Lungs: Clear with good BS bilat with no rales, rhonchi, or wheezes  Heart:  Auscultation: RRR.  Extremities:  BL LE: No pretibial edema normal ROM and strength.  Mental Status: Mood and affect: No depression, anxiety, or agitation     DATA REVIEWED:  ****    Thyroid ultrasound 01/06/2022  Estimated total number of nodules >/= 1 cm: 0   Number of spongiform nodules >/=  2 cm not described below (TR1): 0   Number of mixed cystic and solid nodules >/= 1.5 cm not described below (TR2): 0   _________________________________________________________   Marked thyroid heterogeneity and mild diffuse enlargement. Background pseudo nodularity noted. No hypervascularity.  No discrete nodule or focal abnormality. No regional adenopathy.   IMPRESSION: Heterogeneous mildly enlarged thyroid compatible with chronic medical thyroid disease.   Negative for nodule.    ASSESSMENT / PLAN / RECOMMENDATIONS:   Hyperthyroidism Secondary to Graves' Disease:  - She is clinically euthyroid, she assures me compliance but has not increased methimazole as advised in August,203  -Thyroid ultrasound in 2023 was unrevealing - We had discussed options of RAI ablation vs surgery vs continuing on Methimazole in the past, but given cardiovascular history we opted to stay on methimazole    Medications   methimazole 5 mg,1.5  tabs BID   2. Graves' Disease:   - No extrathyroidal manifestations of graves' disease.    Follow-up in 4 months    Signed electronically by: Mack Guise, MD  [facility]  [facility] [address] [place],  [address] Phone: [phone] FAX: [phone]      CC: [name], NP [place] [address] Phone: [phone]  Fax: [phone]   Return to Endocrinology clinic as below: Future Appointments  Date Time Provider [place]  11/01/2022 11:20 AM Rise Mu, PA-C [facility] None  11/03/2022  4:50 PM [facility] MM 3 [facility]  11/08/2022   9:00 AM [facility] None  11/23/2022 10:15 AM Armando Reichert, MD [facility] None  02/21/2023  9:00 AM [name], NP [facility]

## 2022-10-31 NOTE — Progress Notes (Unsigned)
Cardiology Office Note    Date:  11/01/2022   ID:  Amber [name], DOB 1971/03/20, MRN [id number]  PCP:  [name], NP  Cardiologist:  [name], MD  Electrophysiologist:  None   Chief Complaint: Follow-up  History of Present Illness:   Amber [name] is a 52 y.o. 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  She was diagnosed with severe hyperthyroidism in 05/2019 and admitted with acute respiratory failure requiring mechanical ventilation.  Admission was complicated by NSTEMI and pulmonary edema.  Echo showed an EF of 25 to 30% with wall motion abnormalities suggestive of Takotsubo cardiomyopathy versus anterior infarct.  She underwent R/LHC and was found to have two-vessel CAD with 80% stenosis in the mid LAD, occluded OM branches, and moderate RCA disease.  PCI and DES placement was done to the mid LAD.  Heart failure medications were optimized and hypothyroidism was treated.  Echo in 07/2019 showed an EF of 40 to 45%.  She was admitted in 01/2020 with atypical chest pain and ruled out for MI.  Outpatient Lexiscan MPI showed evidence of prior anterior infarct without ischemia.  Echo showed an EF of 50 to 55% with no significant valvular abnormalities.  Echo in 07/2022 demonstrated an EF of 55 to 60%, no regional wall motion abnormalities, mild LVH, normal LV diastolic function parameters, normal RV systolic function and ventricular cavity size, moderately elevated PASP estimated at 54 mmHg, mild mitral regurgitation, borderline dilatation of the ascending aorta measuring 37 mm, and an estimated right atrial pressure of 3 mmHg.  She has also been followed by pulmonology with VQ scan showing normal perfusion.  Resolution chest CT showed no  findings suggestive of ILD with mild air trapping indicative of small airway disease, cardiomegaly, and coronary artery calcification with aortic atherosclerosis.  She is scheduled for PFTs.  She was last seen in the office on 10/13/2022 with continued shortness of breath and exertional dyspnea.  She underwent RHC on 10/19/2022 showed moderately elevated left heart and pulmonary artery pressures with moderately to severely elevated right heart filling pressures and normal cardiac output/index.  Recommendation was to escalate diuresis and optimize GDMT.  She comes in today continuing to note stable shortness of breath without symptoms of frank angina.  No dizziness, presyncope, or syncope.  No palpitations.  No significant lower extremity swelling or progressive orthopnea.  She is considering purchasing a wedge pillow.  She has noted an increase in urine output on titrated dose of furosemide, though no significant change in weight or dyspnea.  She reports previously being on Entresto, though could not afford this medication and has been maintained on losartan since.   Labs independently reviewed: 09/2022 - Hgb 13.4, PLT 301, BUN 8, serum creatinine 0.6, potassium 3.9, albumin 4.4, AST/ALT normal 08/2022 - A1c 6.8, TC 153, TG 120, HDL 44, LDL 84 05/2022 - TSH less than 0.01, T3 elevated at 186, free T4 normal  Past Medical History:  Diagnosis Date   Acute encephalopathy    Acute pulmonary edema (HCC)    Acute respiratory failure with hypoxia ([place]) 05/20/2019   Anemia    Aortic dilatation (HCC)    37 mm (ascending aorta) 07/2022   CAD (coronary artery disease)    s/p DES to mLAD in 2020, NSTEMI,  HFrEF with improvement in EF.   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   Thyroid storm    Unexplained weight loss 03/27/2019   Valvular insufficiency    Mild MR mild TR noted on echocardiogram in October 2023.    Past Surgical History:  Procedure Laterality Date   CARDIAC CATHETERIZATION     CESAREAN SECTION     x2   COLONOSCOPY N/A 06/24/2021   Procedure: COLONOSCOPY;  Surgeon: [name], MD;  Location: [facility] ENDOSCOPY;  Service: Gastroenterology;  Laterality: N/A;   CORONARY STENT INTERVENTION N/A 05/22/2019   Procedure: CORONARY STENT INTERVENTION;  Surgeon: Iran Ouch, MD;  Location: [facility] INVASIVE CV LAB;  Service: Cardiovascular;  Laterality: N/A;   MASS EXCISION Right 01/18/2013   Procedure: EXCISION right scapular cyst;  Surgeon: Axel Filler, MD;  Location: [facility] ORS;  Service: General;  Laterality: Right;   RIGHT HEART CATH Right 10/19/2022   Procedure: RIGHT HEART CATH;  Surgeon: Yvonne Kendall, MD;  Location: [facility] INVASIVE CV LAB;  Service: Cardiovascular;  Laterality: Right;   RIGHT/LEFT HEART CATH AND CORONARY ANGIOGRAPHY N/A 05/22/2019   Procedure: RIGHT/LEFT HEART CATH AND CORONARY ANGIOGRAPHY;  Surgeon: Iran Ouch, MD;  Location: [facility] INVASIVE CV LAB;  Service: Cardiovascular;  Laterality: N/A;    Current Medications: Current Meds  Medication Sig   aspirin 81 MG chewable tablet Chew 1 tablet (81 mg total) by mouth daily.   atorvastatin (LIPITOR) 80 MG tablet TAKE 1 TABLET BY MOUTH DAILY AT 6 PM.   calcium-vitamin D 250-100 MG-UNIT tablet Take 1 tablet by mouth daily.   empagliflozin (JARDIANCE) 10 MG TABS tablet Take 1 tablet (10 mg total) by mouth daily before breakfast.   ezetimibe (ZETIA) 10 MG tablet TAKE 1 TABLET BY MOUTH  EVERY DAY   ferrous sulfate 325 (65 FE) MG tablet Take 325 mg by mouth 3 (three) times a week. No set days   furosemide (LASIX) 40 MG tablet Take 1 tablet (40 mg total) by mouth daily.   isosorbide mononitrate (IMDUR) 30 MG 24 hr tablet TAKE 1/2 TABLET BY MOUTH DAILY   losartan (COZAAR) 100 MG tablet Take 1 tablet (100 mg total) by mouth daily. for blood pressure.   metFORMIN (GLUCOPHAGE-XR) 500 MG 24 hr tablet TAKE 1 TABLET (500 MG TOTAL) BY MOUTH DAILY WITH BREAKFAST. FOR DIABETES.   metoprolol succinate (TOPROL-XL) 25 MG 24 hr tablet TAKE 1 TABLET BY MOUTH EVERY DAY   nitroGLYCERIN (NITROSTAT) 0.4 MG SL tablet Place 1 tablet (0.4 mg total) under the tongue every 5 (five) minutes as needed for chest pain.   spironolactone (ALDACTONE) 25 MG tablet TAKE 1/2 TABLET BY MOUTH DAILY   [DISCONTINUED] methimazole (TAPAZOLE) 5 MG tablet Take 2 tablets (10 mg total) by mouth 2 (two) times daily. (Patient taking differently: Take 7.5 mg by mouth 2 (two) times daily.)    Allergies:   Patient  has no known allergies.   Social History   Socioeconomic History   Marital status: Married    Spouse name: Not on file   Number of children: 2   Years of education: Not on file   Highest education level: Not on file  Occupational History    Comment: front desk   Tobacco Use   Smoking status: Never   Smokeless tobacco: Never  Vaping Use   Vaping Use: Never used  Substance and Sexual Activity   Alcohol use: Yes    Alcohol/week: 0.0 standard drinks of alcohol    Comment: rare   Drug use: No   Sexual activity: Not Currently  Other Topics Concern   Not on file  Social History Narrative   From [place].   Married.   2 children.   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   Social Determinants of Health   Financial Resource Strain: Not on file  Food Insecurity: Not on file  Transportation Needs: Not on file  Physical Activity: Not on file   Stress: Not on file  Social Connections: Not on file     Family History:  The patient's family history includes Diabetes in her father, mother, and sister; Heart disease in her mother; Hypertension in her sister.  ROS:   12-point review of systems is negative unless otherwise noted in the HPI   EKGs/Labs/Other Studies Reviewed:    Studies reviewed were summarized above. The additional studies were reviewed today:  RHC 10/19/2022: Conclusions: Moderately elevated left heart and pulmonary artery pressures. Moderately to severely elevated right heart filling pressures. Normal cardiac output/index.   Recommendations: Escalate diuresis; furosemide increased to 40 mg daily. Optimize goal-directed medical therapy for chronic heart failure with recovered ejection fraction __________  2D echo 08/09/2022: 1. Left ventricular ejection fraction, by estimation, is 55 to 60%. The  left ventricle has normal function. The left ventricle has no regional  wall motion abnormalities. There is mild left ventricular hypertrophy.  Left ventricular diastolic parameters  were normal.   2. Right ventricular systolic function is normal. The right ventricular  size is normal. There is moderately elevated pulmonary artery systolic  pressure. The estimated right ventricular systolic pressure is 54.0 mmHg.   3. The mitral valve is normal in structure. Mild mitral valve  regurgitation. No evidence of mitral stenosis.   4. The aortic valve has an indeterminant number of cusps. Aortic valve  regurgitation is not visualized. No aortic stenosis is present.   5. There is borderline dilatation of the ascending aorta, measuring 37  mm.   6. The inferior vena cava is normal in size with greater than 50%  respiratory variability, suggesting right atrial pressure of 3 mmHg.  __________  [facility]ks MPI 02/19/2020: There was no ST segment deviation noted during stress. There are severe perfusion defects of mild to  moderate size present in the mid to apical anteroseptal wall, basal to mid lateral wall, and the true apex. Findings consistent with prior myocardial infarction. The left ventricular ejection fraction is normal (54%). This is a low to intermediate risk study. There was no evidence for ischemia __________  2D echo 02/12/2020: 1. Left ventricular ejection fraction, by estimation, is 50 to 55%. The  left ventricle has low normal function. The left ventricle has no regional  wall motion abnormalities. Indeterminate diastolic filling due to E-A  fusion.   2. Right ventricular systolic function is normal. The right ventricular  size is normal.   3. Left atrial size was mildly dilated.   4. The mitral valve is normal in structure. No evidence of mitral valve  regurgitation.   5. The aortic valve is grossly normal. Aortic valve regurgitation is not  visualized.  __________  2D echo 08/08/2019: 1. Left ventricular ejection fraction, by visual estimation, is 40 to  45%. The left ventricle has severely decreased function. Mildly increased  left ventricular size. There is no left ventricular hypertrophy.   2. Left ventricular diastolic Doppler parameters are consistent with  pseudonormalization pattern of LV diastolic filling.   3. Global right ventricle has normal systolic function.The right  ventricular size is normal. No increase in right ventricular wall  thickness.   4. Left atrial size was mildly dilated.   5. Mildly elevated pulmonary artery systolic pressure.  __________  [facility] 05/22/2019: 1st Mrg lesion is 100% stenosed. 2nd Mrg lesion is 100% stenosed. Mid LAD lesion is 80% stenosed. Post intervention, there is a 0% residual stenosis. A drug-eluting stent was successfully placed using a STENT RESOLUTE ONYX 3.0X18. 2nd Diag lesion is 30% stenosed. Prox RCA to Mid RCA lesion is 50% stenosed. RPAV lesion is 40% stenosed. RPDA lesion is 30% stenosed.   1.  Significant two-vessel  coronary artery disease with 80% mid LAD stenosis, occluded OM branches and moderate RCA disease. 2.  Left ventricular angiography was not performed.  EF was severely reduced by echo. 3.  Right heart catheterization showed high normal filling pressures with mean RA pressure of 8 mmHg, pulmonary capillary wedge pressure of 12 to 13 mmHg, mild pulmonary hypertension and high cardiac output at 8 L/min. 4.  Successful angioplasty and drug-eluting stent placement to the mid LAD.   Recommendations: Continue dual antiplatelet therapy for at least 1 year. The patient needs aggressive medical therapy for the rest of her coronary artery disease.  I added high-dose atorvastatin. I added oral metoprolol to control tachycardia. I discontinued IV furosemide for now and she might require resumption of this either intravenously or orally as needed. The patient was alert throughout the procedure .  Her hemodynamics are good enough to allow extubation if no other issues. __________  2D echo 05/21/2019: 1. The left ventricle has severely reduced systolic function, with an  ejection fraction of 25-30%. The cavity size was moderately dilated. Left  ventricular diastolic parameters were normal.   2. 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   3. The right ventricle has normal systolic function. The cavity was  normal. There is no increase in right ventricular wall thickness.   4. The aortic valve is tricuspid. Mild thickening of the aortic valve.   5. The aorta is normal in size and structure.    EKG:  EKG is ordered today.  The EKG ordered today demonstrates NSR, 85 bpm, LVH with early repolarization abnormality, possible prior anterior septal infarct, no acute ST-T changes  Recent Labs: 09/21/2022: ALT 18; BUN 8; Creatinine, Ser 0.60; Platelets 301 10/19/2022: Hemoglobin 12.9; Potassium 3.7; Sodium 142 10/31/2022: TSH 0.00  Recent Lipid Panel    Component Value Date/Time   CHOL 153  08/17/2022 1150   CHOL 176 01/05/2021 1504   TRIG 120.0 08/17/2022 1150   HDL 44.90 08/17/2022 1150   HDL 49 01/05/2021 1504   CHOLHDL 3 08/17/2022 1150   VLDL 24.0 08/17/2022 1150   LDLCALC 84 08/17/2022 1150   LDLCALC 98 01/05/2021 1504    PHYSICAL EXAM:    VS:  BP Marland Kitchen)  140/90 (BP Location: Left Arm, Patient Position: Sitting, Cuff Size: Large)   Pulse 85   Ht 5\' 4"  (1.626 m)   Wt 247 lb (112 kg)   LMP 05/19/2021 (Approximate)   SpO2 96%   BMI 42.40 kg/m   BMI: Body mass index is 42.4 kg/m.  Physical Exam Vitals reviewed.  Constitutional:      Appearance: She is well-developed.  HENT:     Head: Normocephalic and atraumatic.  Eyes:     General:        Right eye: No discharge.        Left eye: No discharge.  Neck:     Vascular: No JVD.  Cardiovascular:     Rate and Rhythm: Normal rate and regular rhythm.     Pulses:          Dorsalis pedis pulses are 2+ on the right side and 2+ on the left side.       Posterior tibial pulses are 2+ on the right side and 2+ on the left side.     Heart sounds: Normal heart sounds, S1 normal and S2 normal. Heart sounds not distant. No midsystolic click and no opening snap. No murmur heard.    No friction rub.  Pulmonary:     Effort: Pulmonary effort is normal. No respiratory distress.     Breath sounds: Normal breath sounds. No decreased breath sounds, wheezing or rales.  Chest:     Chest wall: No tenderness.  Abdominal:     General: There is no distension.  Musculoskeletal:     Cervical back: Normal range of motion.     Right lower leg: No edema.     Left lower leg: No edema.  Skin:    General: Skin is warm and dry.     Nails: There is no clubbing.  Neurological:     Mental Status: She is alert and oriented to person, place, and time.  Psychiatric:        Speech: Speech normal.        Behavior: Behavior normal.        Thought Content: Thought content normal.        Judgment: Judgment normal.     Wt Readings from Last 3  Encounters:  11/01/22 247 lb (112 kg)  10/31/22 245 lb (111.1 kg)  10/19/22 242 lb (109.8 kg)     ASSESSMENT & PLAN:   CAD involving the native coronary arteries without: She remains without symptoms of angina.  Continue aggressive risk factor modification and secondary prevention, atorvastatin, ezetimibe, isosorbide mononitrate and metoprolol succinate.  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  We did discuss rechallenge with 12/18/22, though she reports this was not a financially feasible medication.  We will add Jardiance 10 mg daily with the decreasing of furosemide to 20 mg daily following addition of SGLT2 inhibitor.  She will continue losartan and spironolactone.  Check BMP today and again 1 week after addition of SGLT2 inhibitor.  We will refer her to Dr. Sherryll Burger for ongoing management of pulmonary hypertension.  Follow-up with pulmonology as directed as well.  Handicap application was completed.  HTN: Blood pressure is mildly elevated in the office today.  Adding SGLT2 inhibitor as outlined above.  HLD: LDL 84 in 08/2022 with normal AST/ALT in 09/2022.  She remains on atorvastatin and ezetimibe.  Hyperthyroidism: TSH remains suppressed on  labs obtained yesterday.  She remains on methimazole.  Follow-up with endocrinology as directed.   Disposition: F/u with Dr. Kirke Corin or an APP in 1 month.   Medication Adjustments/Labs and Tests Ordered: Current medicines are reviewed at length with the patient today.  Concerns regarding medicines are outlined above. Medication changes, Labs and Tests ordered today are summarized above and listed in the Patient Instructions accessible in Encounters.   Signed, Eula Listen, PA-C 11/01/2022 12:06 PM      [facility] [address] [place], [place]  [address] [phone]

## 2022-11-01 ENCOUNTER — Telehealth: Payer: Self-pay | Admitting: Internal Medicine

## 2022-11-01 ENCOUNTER — Encounter: Payer: Self-pay | Admitting: Physician Assistant

## 2022-11-01 ENCOUNTER — Ambulatory Visit: Payer: Commercial Managed Care - PPO | Attending: Physician Assistant | Admitting: Physician Assistant

## 2022-11-01 ENCOUNTER — Other Ambulatory Visit
Admission: RE | Admit: 2022-11-01 | Discharge: 2022-11-01 | Disposition: A | Discharge status: Home / Self Care | Payer: Commercial Managed Care - PPO | Source: Ambulatory Visit | Attending: Physician Assistant | Admitting: Physician Assistant

## 2022-11-01 ENCOUNTER — Other Ambulatory Visit: Payer: Self-pay | Admitting: *Deleted

## 2022-11-01 VITALS — BP 140/90 | HR 85 | Ht 64.0 in | Wt 247.0 lb

## 2022-11-01 DIAGNOSIS — E0590 Thyrotoxicosis, unspecified without thyrotoxic crisis or storm: Secondary | ICD-10-CM

## 2022-11-01 DIAGNOSIS — I10 Essential (primary) hypertension: Secondary | ICD-10-CM | POA: Insufficient documentation

## 2022-11-01 DIAGNOSIS — E785 Hyperlipidemia, unspecified: Secondary | ICD-10-CM

## 2022-11-01 DIAGNOSIS — I5020 Unspecified systolic (congestive) heart failure: Secondary | ICD-10-CM

## 2022-11-01 DIAGNOSIS — I2510 Atherosclerotic heart disease of native coronary artery without angina pectoris: Secondary | ICD-10-CM | POA: Insufficient documentation

## 2022-11-01 DIAGNOSIS — I5032 Chronic diastolic (congestive) heart failure: Secondary | ICD-10-CM

## 2022-11-01 DIAGNOSIS — I272 Pulmonary hypertension, unspecified: Secondary | ICD-10-CM

## 2022-11-01 LAB — BASIC METABOLIC PANEL
Anion gap: 7 (ref 5–15)
BUN: 10 mg/dL (ref 6–20)
CO2: 25 mmol/L (ref 22–32)
Calcium: 8.6 mg/dL — ABNORMAL LOW (ref 8.9–10.3)
Chloride: 105 mmol/L (ref 98–111)
Creatinine, Ser: 0.55 mg/dL (ref 0.44–1.00)
GFR, Estimated: >60 mL/min (ref >60)
Glucose, Bld: 198 mg/dL — ABNORMAL HIGH (ref 70–99)
Potassium: 3.4 mmol/L — ABNORMAL LOW (ref 3.5–5.1)
Sodium: 137 mmol/L (ref 135–145)

## 2022-11-01 MED ORDER — EMPAGLIFLOZIN 10 MG PO TABS: 10.0000 mg | ORAL_TABLET | Freq: Every day | ORAL | 11 refills | Status: DC

## 2022-11-01 MED ORDER — METHIMAZOLE 5 MG PO TABS: 10.0000 mg | ORAL_TABLET | Freq: Two times a day (BID) | ORAL | 2 refills | Status: DC

## 2022-11-01 MED ORDER — POTASSIUM CHLORIDE ER 10 MEQ PO TBCR: 10.0000 meq | EXTENDED_RELEASE_TABLET | Freq: Every day | ORAL | 3 refills | Status: DC

## 2022-11-01 NOTE — Patient Instructions (Addendum)
Medication Instructions:  Your physician has recommended you make the following change in your medication:   START Jardiance 10 mg once daily   *If you need a refill on your cardiac medications before your next appointment, please call your pharmacy*   Lab Work: BMET today over at the [facility] entrance. Make sure to stop at registration desk to check in.   If you have labs (blood work) drawn today and your tests are completely normal, you will receive your results only by: [place] (if you have MyChart) OR A paper copy in the mail If you have any lab test that is abnormal or we need to change your treatment, we will call you to review the results.   Testing/Procedures: None   Follow-Up: At [facility], you and your health needs are our priority.  As part of our continuing mission to provide you with exceptional heart care, we have created designated Provider Care Teams.  These Care Teams include your primary Cardiologist (physician) and Advanced Practice Providers (APPs -  Physician Assistants and Nurse Practitioners) who all work together to provide you with the care you need, when you need it.  We recommend signing up for the patient portal called "MyChart".  Sign up information is provided on this After Visit Summary.  MyChart is used to connect with patients for Virtual Visits (Telemedicine).  Patients are able to view lab/test results, encounter notes, upcoming appointments, etc.  Non-urgent messages can be sent to your provider as well.   To learn more about what you can do with MyChart, go to [email or web address].    Your next appointment:   1 month(s)  Provider:   [name], MD or Christell Faith, PA-C    Other Instructions Referral placed for patient to see Dr. Sung Amabile  (If possible on 1/23 or 1/27    Medication Samples have been provided to the patient.  Drug name: Jardiance        Strength: 10 mg         Qty: 4 boxes   LOT:  [id number]   Exp.Date: Jan2026

## 2022-11-01 NOTE — Telephone Encounter (Signed)
Left a vm for patient to callback regarding lab results 

## 2022-11-01 NOTE — Telephone Encounter (Signed)
Please let the patient know that her thyroid continues to be overactive   She NEEDS to increase methimazole to 2 tablets in the morning and 2 tablets in the evening    Please make sure that she reads back to you the instructions   Thanks

## 2022-11-01 NOTE — Telephone Encounter (Signed)
Patient was advised and as was able to read instructions back to me.

## 2022-11-03 ENCOUNTER — Ambulatory Visit
Admission: RE | Admit: 2022-11-03 | Discharge: 2022-11-03 | Disposition: A | Discharge status: Home / Self Care | Payer: Commercial Managed Care - PPO | Source: Ambulatory Visit | Attending: Primary Care | Admitting: Primary Care

## 2022-11-03 DIAGNOSIS — Z1231 Encounter for screening mammogram for malignant neoplasm of breast: Secondary | ICD-10-CM

## 2022-11-08 ENCOUNTER — Ambulatory Visit: Payer: Commercial Managed Care - PPO | Attending: Student in an Organized Health Care Education/Training Program

## 2022-11-08 DIAGNOSIS — I272 Pulmonary hypertension, unspecified: Secondary | ICD-10-CM | POA: Insufficient documentation

## 2022-11-08 DIAGNOSIS — R0602 Shortness of breath: Secondary | ICD-10-CM | POA: Diagnosis not present

## 2022-11-08 LAB — PULMONARY FUNCTION TEST ARMC ONLY
DL/VA % pred: 143 %
DL/VA: 6.15 ml/min/mmHg/L
DLCO unc % pred: 96 %
DLCO unc: 20.09 ml/min/mmHg
FEF 25-75 Post: 2.94 L/sec
FEF 25-75 Pre: 2.21 L/sec
FEF2575-%Change-Post: 32 %
FEF2575-%Pred-Post: 109 %
FEF2575-%Pred-Pre: 82 %
FEV1-%Change-Post: 12 %
FEV1-%Pred-Post: 60 %
FEV1-%Pred-Pre: 54 %
FEV1-Post: 1.67 L
FEV1-Pre: 1.49 L
FEV1FVC-%Change-Post: 2 %
FEV1FVC-%Pred-Pre: 110 %
FEV6-%Change-Post: 8 %
FEV6-%Pred-Post: 53 %
FEV6-%Pred-Pre: 49 %
FEV6-Post: 1.83 L
FEV6-Pre: 1.68 L
FEV6FVC-%Pred-Post: 102 %
FEV6FVC-%Pred-Pre: 102 %
FVC-%Change-Post: 9 %
FVC-%Pred-Post: 52 %
FVC-%Pred-Pre: 48 %
FVC-Post: 1.85 L
FVC-Pre: 1.7 L
Post FEV1/FVC ratio: 90 %
Post FEV6/FVC ratio: 100 %
Pre FEV1/FVC ratio: 88 %
Pre FEV6/FVC Ratio: 100 %
RV % pred: 81 %
RV: 1.49 L
TLC % pred: 68 %
TLC: 3.48 L

## 2022-11-08 MED ORDER — ALBUTEROL SULFATE (2.5 MG/3ML) 0.083% IN NEBU
2.5000 mg | INHALATION_SOLUTION | Freq: Once | RESPIRATORY_TRACT | Status: AC
  Administered 2022-11-08: 2.5 mg via RESPIRATORY_TRACT
  Filled 2022-11-08: qty 3

## 2022-11-13 NOTE — Progress Notes (Signed)
ADVANCED HF CLINIC CONSULT NOTE  Referring Provider: Eula Listen PA-C Primary Care:  Doreene Nest, NP Primary Cardiologist: Lorine Bears, MD   HPI:  Amber [name] is a 52 y.o. 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  Admission c/b NSTEMI and HF. Echo EF 25-30% with wall motion abnormalities suggestive of Takotsubo cardiomyopathy versus anterior infarct.  R/LHC with 2-v CAD with 80% stenosis in mLAD, occluded OM branches, and moderate RCA disease.  PCI/DES ->LAD. HF meds optimized and thyroid disease treated.  Echo 10/20 EF 40-45%.    Admitted 4/21 with atypical chest pain and ruled out for MI.  Outpatient Lexiscan MPI showed evidence of prior anterior infarct without ischemia.  Echo EF 50-55%   Echo 10/23 demonstrated EF 55-60%,mild LVH, normal LV diastolic function parameters, normal RV systolic function and ventricular cavity size, moderately elevated PASP estimated at 54 mmHg, mild mitral regurgitation, borderline dilatation of the ascending aorta measuring 37 mm, and an estimated right atrial pressure of 3 mmHg.   She has also been followed by pulmonology with VQ scan showing normal perfusion.  Resolution chest CT showed no findings suggestive of ILD with mild air trapping indicative of small airway disease, cardiomegaly, and coronary artery calcification with aortic atherosclerosis.  She is scheduled for PFTs.   She was last seen in the office on 10/13/2022 with continued shortness of breath and exertional dyspnea.  She underwent RHC on 10/19/2022.   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 Says she feels ok. Can walk on flat ground without problem but SOB with stairs and incline. + orthopnea. Trace edema. Snores "terribly". Had sleep study 1/25 AHI 16 with lowest sat 85%. Lost 60 pounds in 2020. Has gained back about 100 pounds in last 3-4 years.    PFTs 1/24 FEV1 1.49L (54%) FVC 1.70L (48%) Ratio 88%  DLCO 96%  Review of Systems: [y] = yes, [ ]  = no   General: Weight gain [ y]; Weight loss [ ] ; Anorexia [ ] ; Fatigue [ y]; Fever [ ] ; Chills [ ] ; Weakness [ ]   Cardiac: Chest pain/pressure [ ] ; Resting SOB [ ] ; Exertional SOB ]; Orthopnea [ ] ; Pedal Edema [ ] ; Palpitations [ ] ; Syncope [ ] ; Presyncope [ ] ; Paroxysmal nocturnal dyspnea[ ]   Pulmonary: Cough [ ] ; Wheezing[ ] ; Hemoptysis[ ] ; Sputum [ ] ; Snoring ]  GI: Vomiting[ ] ; Dysphagia[ ] ; Melena[ ] ; Hematochezia [ ] ; Heartburn[ ] ; Abdominal pain [ ] ; Constipation [ ] ; Diarrhea [ ] ; BRBPR [ ]   GU: Hematuria[ ] ; Dysuria [ ] ; Nocturia[ ]   Vascular: Pain in legs with walking [ ] ; Pain in feet with lying flat [ ] ; Non-healing sores [ ] ; Stroke [ ] ; TIA [ ] ; Slurred speech [ ] ;  Neuro: Headaches[ ] ; Vertigo[ ] ; Seizures[ ] ; Paresthesias[ ] ;Blurred vision [ ] ; Diplopia [ ] ; Vision changes [ ]   Ortho/Skin: Arthritis [ ] ; Joint pain [ ] ; Muscle  pain [ ] ; Joint swelling [ ] ; Back Pain [ ] ; Rash [ ]   Psych: Depression[ ] ; Anxiety[ ]   Heme: Bleeding problems [ ] ; Clotting disorders [ ] ; Anemia [ ]   Endocrine: Diabetes [ ] ; Thyroid dysfunction[y ]   Past Medical History:  Diagnosis Date   Acute encephalopathy    Acute pulmonary edema (HCC)    Acute respiratory failure with hypoxia (HCC) 05/20/2019   Anemia    Aortic dilatation (HCC)    37 mm (ascending aorta) 07/2022   CAD (coronary artery disease)    s/p DES to mLAD in 2020, NSTEMI, HFrEF with improvement  in EF.   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   Thyroid storm    Unexplained weight loss 03/27/2019   Valvular insufficiency    Mild MR mild TR noted on echocardiogram in October 2023.    Current Outpatient Medications  Medication Sig Dispense Refill   aspirin 81 MG chewable tablet Chew 1 tablet (81 mg total) by mouth daily. 30 tablet 11   atorvastatin (LIPITOR) 80 MG tablet TAKE 1 TABLET BY MOUTH DAILY AT 6 PM. 90 tablet 0   calcium-vitamin D 250-100 MG-UNIT tablet Take 1 tablet by mouth daily.     empagliflozin (JARDIANCE) 10 MG TABS tablet Take 1 tablet (10 mg total) by mouth daily before breakfast. 30 tablet 11   ezetimibe (ZETIA) 10 MG tablet TAKE 1 TABLET BY MOUTH EVERY DAY 30 tablet 6   ferrous sulfate 325 (65 FE) MG tablet Take 325 mg by mouth 3 (three) times a week. No set days     furosemide (LASIX) 40 MG tablet Take 1 tablet (40 mg total) by mouth daily. 30 tablet 5   isosorbide mononitrate (IMDUR) 30 MG 24 hr tablet TAKE 1/2 TABLET BY MOUTH DAILY 45 tablet 2   losartan (COZAAR) 100 MG tablet TAKE 1 TABLET BY MOUTH EVERY DAY FOR BLOOD PRESSURE (PHARMACY NOT IN NETWORK) 90 tablet 2   metFORMIN (GLUCOPHAGE-XR) 500 MG 24 hr tablet TAKE 1 TABLET (500 MG TOTAL) BY MOUTH DAILY WITH BREAKFAST. FOR DIABETES. 90 tablet 1   methimazole (TAPAZOLE) 5 MG tablet Take 2 tablets (10 mg total) by mouth 2 (two) times daily. 360 tablet 2   metoprolol succinate  (TOPROL-XL) 25 MG 24 hr tablet TAKE 1 TABLET BY MOUTH EVERY DAY 30 tablet 2   potassium chloride (KLOR-CON) 10 MEQ tablet Take 1 tablet (10 mEq total) by mouth daily. 90 tablet 3   spironolactone (ALDACTONE) 25 MG tablet TAKE 1/2 TABLET BY MOUTH DAILY 15 tablet 3   nitroGLYCERIN (NITROSTAT) 0.4 MG SL tablet Place 1 tablet (0.4 mg total) under the tongue every 5 (five) minutes as needed for chest pain. 25 tablet 1   No current facility-administered medications for this visit.   Facility-Administered Medications Ordered in Other Visits  Medication Dose Route Frequency Provider Last Rate Last Admin  sodium chloride flush (NS) 0.9 % injection 3 mL  3 mL Intravenous Q12H Hammock, Sheri, NP        No Known Allergies    Social History   Socioeconomic History   Marital status: Married    Spouse name: Not on file   Number of children: 2   Years of education: Not on file   Highest education level: Not on file  Occupational History    Comment: front desk   Tobacco Use   Smoking status: Never   Smokeless tobacco: Never  Vaping Use   Vaping Use: Never used  Substance and Sexual Activity   Alcohol use: Yes    Alcohol/week: 0.0 standard drinks of alcohol    Comment: rare   Drug use: No   Sexual activity: Not Currently  Other Topics Concern   Not on file  Social History Narrative   From [place].   Married.   2 children.   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   Social Determinants of Health   Financial Resource Strain: Not on file  Food Insecurity: Not on file  Transportation Needs: Not on file  Physical Activity: Not on file  Stress: Not on file  Social Connections: Not on file  Intimate Partner Violence: Not on file      Family History  Problem Relation Age of Onset   Diabetes Father    Diabetes Mother    Heart disease Mother    Diabetes Sister    Hypertension Sister     Vitals:   11/15/22 1116  BP:  (!) 148/80  Pulse: 68  SpO2: 96%  Weight: 248 lb 6.4 oz (112.7 kg)    PHYSICAL EXAM: General:  Well appearing. No respiratory difficulty HEENT: normal Neck: supple. no JVD. Carotids 2+ bilat; no bruits. No lymphadenopathy appreciated. + goiter Cor: PMI nondisplaced. Regular rate & rhythm. No rubs, gallops or murmurs. Lungs: clear Abdomen: obese  soft, nontender, nondistended. No hepatosplenomegaly. No bruits or masses. Good bowel sounds. Extremities: no cyanosis, clubbing, rash, edema Neuro: alert & oriented x 3, cranial nerves grossly intact. moves all 4 extremities w/o difficulty. Affect pleasant.  ECG: Sinus rhythm 85 No ST-T wave abnormalities. Personally reviewed   ASSESSMENT & PLAN:    1. Pulmonary HTN, mild - RHC 1/24: RA 12  PA 44/27 (35) PCWP 21 PVR 1.7 WU  Fick 8.1/3.8.  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 Long talk about need for weight loss and treatment of OSA. Asked her to f/u with Pulmonary for CPAP and PCP for [facility].  - If fails GLP1RA can consider referral for Bariatric surgery   2. Dyspnea - as above. based on w/u suspect majority of ongoing dyspnea related to her weight and OSA/OHS with severe restrictive lung disease - Body mass index is 42.64 kg/m.  3.. HFrEF  - secondary to mixed ICM and NICM with recovered LV systolic function/pulmonary hypertension:  - NYHA III - RHC 1/24: RA 12  PA 44/27 (35) PCWP 21 PVR 1.7 WU  Fick 8.1/3.8.  - Volume status ok. Need to keep dry   4. CAD s/p  NSTEMI 2020 with PCI LAD - no s/s angina - Myoview 5/21 No ischemia  - Continue ASA/statin  5. HTN:  - Per PCP and Gen Cards  6. Morbid obesity - Body mass index is  42.64 kg/m. - needs weight loss drastically - As above consider GLP1RA  7. DM2 - Continue Jardiance/metformin - Consider GLP1RA  8. Hyperthyroidism - TSH remains suppressed on labs obtained yesterday.  She remains on methimazole.  Follow-up with endocrinology as directed.  9. Moderate OSA - F/u with Pulmonary for CPAP - weight loss  Total time spent 45 minutes. Over half that time spent discussing above.    Glori Bickers, MD  11:53 AM

## 2022-11-14 ENCOUNTER — Other Ambulatory Visit: Payer: Self-pay | Admitting: Primary Care

## 2022-11-14 DIAGNOSIS — I10 Essential (primary) hypertension: Secondary | ICD-10-CM

## 2022-11-15 ENCOUNTER — Ambulatory Visit (HOSPITAL_BASED_OUTPATIENT_CLINIC_OR_DEPARTMENT_OTHER): Payer: Commercial Managed Care - PPO | Admitting: Internal Medicine

## 2022-11-15 ENCOUNTER — Encounter: Payer: Commercial Managed Care - PPO | Admitting: Internal Medicine

## 2022-11-15 ENCOUNTER — Other Ambulatory Visit
Admission: RE | Admit: 2022-11-15 | Discharge: 2022-11-15 | Disposition: A | Discharge status: Home / Self Care | Payer: Commercial Managed Care - PPO | Attending: Physician Assistant | Admitting: Physician Assistant

## 2022-11-15 ENCOUNTER — Encounter: Payer: Self-pay | Admitting: Internal Medicine

## 2022-11-15 VITALS — BP 148/80 | HR 68 | Wt 248.4 lb

## 2022-11-15 DIAGNOSIS — I272 Pulmonary hypertension, unspecified: Secondary | ICD-10-CM | POA: Diagnosis not present

## 2022-11-15 DIAGNOSIS — I2510 Atherosclerotic heart disease of native coronary artery without angina pectoris: Secondary | ICD-10-CM | POA: Insufficient documentation

## 2022-11-15 DIAGNOSIS — I5020 Unspecified systolic (congestive) heart failure: Secondary | ICD-10-CM | POA: Insufficient documentation

## 2022-11-15 DIAGNOSIS — I5032 Chronic diastolic (congestive) heart failure: Secondary | ICD-10-CM | POA: Diagnosis present

## 2022-11-15 DIAGNOSIS — G4730 Sleep apnea, unspecified: Secondary | ICD-10-CM | POA: Diagnosis not present

## 2022-11-15 DIAGNOSIS — Z6841 Body Mass Index (BMI) 40.0 and over, adult: Secondary | ICD-10-CM

## 2022-11-15 LAB — BASIC METABOLIC PANEL
Anion gap: 11 (ref 5–15)
BUN: 17 mg/dL (ref 6–20)
CO2: 24 mmol/L (ref 22–32)
Calcium: 9 mg/dL (ref 8.9–10.3)
Chloride: 105 mmol/L (ref 98–111)
Creatinine, Ser: 0.84 mg/dL (ref 0.44–1.00)
GFR, Estimated: >60 mL/min (ref >60)
Glucose, Bld: 153 mg/dL — ABNORMAL HIGH (ref 70–99)
Potassium: 3.7 mmol/L (ref 3.5–5.1)
Sodium: 140 mmol/L (ref 135–145)

## 2022-11-15 NOTE — Patient Instructions (Signed)
Medication Changes:  None, Continue current medications  Lab Work:  none  Testing/Procedures:  none  Referrals:  none  Special Instructions // Education:  Please follow-up with your Pulmonologist for sleep apnea  Please follow-up with your Primary Care Physician about medication for diabetes and weight loss  Follow-Up in: 2-3 months, we will call you closer to this time to schedule an appointment    If you have any questions or concerns before your next appointment please send [place] a message through mychart or call our office at [phone] Monday-Friday 8 am-5 pm.   If you have an urgent need after hours on the weekend please call your Primary Cardiologist or the [facility] in [place] at [phone].

## 2022-11-18 ENCOUNTER — Telehealth: Payer: Self-pay | Admitting: Student in an Organized Health Care Education/Training Program

## 2022-11-18 DIAGNOSIS — G4733 Obstructive sleep apnea (adult) (pediatric): Secondary | ICD-10-CM

## 2022-11-18 NOTE — Telephone Encounter (Signed)
ATC Patient. LVM to return call. 

## 2022-11-18 NOTE — Telephone Encounter (Signed)
Split night sleep study was done 10/20/2022

## 2022-11-21 NOTE — Telephone Encounter (Signed)
Patient is aware of results and voiced her understanding.  She agrees with plan. She would like to reschedule 11/23/2022 visit due to being covid +. Appt rescheduled 12/12/2022 Nothing further needed.

## 2022-11-23 ENCOUNTER — Ambulatory Visit: Payer: Commercial Managed Care - PPO | Admitting: Student in an Organized Health Care Education/Training Program

## 2022-12-01 NOTE — Progress Notes (Unsigned)
Cardiology Office Note    Date:  12/05/2022   ID:  Amber [name], DOB 12-11-1970, MRN [id number]  PCP:  [name], NP  Cardiologist:  [name], MD  Electrophysiologist:  None   Chief Complaint: Follow up  History of Present Illness:   Amber [name] is a 52 y.o. 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   She was diagnosed with severe hyperthyroidism in 05/2019 and admitted with acute respiratory failure requiring mechanical ventilation.  Admission was complicated by NSTEMI and pulmonary edema.  Echo showed an EF of 25 to 30% with wall motion abnormalities suggestive of Takotsubo cardiomyopathy versus anterior infarct.  She underwent R/LHC and was found to have two-vessel CAD with 80% stenosis in the mid LAD, occluded OM branches, and moderate RCA disease.  PCI and DES placement was done to the mid LAD.  Heart failure medications were optimized and hypothyroidism was treated.  Echo in 07/2019 showed an EF of 40 to 45%.  She was admitted in 01/2020 with atypical chest pain and ruled out for MI.  Outpatient Lexiscan MPI showed evidence of prior anterior infarct without ischemia.  Echo showed an EF of 50 to 55% with no significant valvular abnormalities.   Echo in 07/2022 demonstrated an EF of 55 to 60%, no regional wall motion abnormalities, mild LVH, normal LV diastolic function parameters, normal RV systolic function and ventricular cavity size, moderately elevated PASP estimated at 54 mmHg, mild mitral regurgitation, borderline dilatation of the ascending aorta measuring 37 mm, and an estimated right atrial pressure of 3 mmHg.   She has also been followed by pulmonology with VQ scan showing normal  perfusion.  Resolution chest CT showed no findings suggestive of ILD with mild air trapping indicative of small airway disease, cardiomegaly, and coronary artery calcification with aortic atherosclerosis.     She was seen in the office on 10/13/2022 with continued shortness of breath and exertional dyspnea.  She underwent RHC on 10/19/2022 showed moderately elevated left heart and pulmonary artery pressures with moderately to severely elevated right heart filling pressures and normal cardiac output/index.  Recommendation was to escalate diuresis and optimize GDMT.  PFTs consistent with severe restrictive lung disease.  Sleep study positive for sleep apnea.  She was seen on 11/01/2022 and was without symptoms of angina or cardiac decompensation.  Her dyspnea was largely unchanged despite escalation of diuretic.  She was started on Jardiance and referred to the advanced heart failure clinic is felt to be related to [facility].  She was not a candidate for selective pulmonary artery vasodilators based on hemodynamics.  She was advised to follow-up with pulmonology for CPAP and PCP for consideration of GLP1 agonist.   She comes and is doing well from a cardiac perspective.  Since she was last seen, she was diagnosed with COVID though does not have any lingering symptoms at this time.  Her dyspnea is stable.  No symptoms of angina, palpitations, dizziness, presyncope, or syncope.  Her weight is stable.  She is awaiting arrival for her CPAP.  No progressive orthopnea.   Labs independently reviewed: 10/2022 - potassium 3.7, BUN 17, serum creatinine 0.84, free T4 normal, TSH 0.00 09/2022 - Hgb 13.4, PLT 301, albumin 4.4, AST/ALT normal 08/2022 - A1c  6.8, TC 153, TG 120, HDL 44, LDL 84   Past Medical History:  Diagnosis Date   Acute encephalopathy    Acute pulmonary edema (HCC)    Acute respiratory failure with hypoxia ([place]) 05/20/2019   Anemia    Aortic dilatation (HCC)    37 mm (ascending aorta) 07/2022   CAD  (coronary artery disease)    s/p DES to mLAD in 2020, NSTEMI, HFrEF with improvement in EF.   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   Thyroid storm    Unexplained weight loss 03/27/2019   Valvular insufficiency    Mild MR mild TR noted on echocardiogram in October 2023.    Past Surgical History:  Procedure Laterality Date   CARDIAC CATHETERIZATION     CESAREAN SECTION     x2   COLONOSCOPY N/A 06/24/2021   Procedure: COLONOSCOPY;  Surgeon: Jonathon Bellows, MD;  Location: [facility] ENDOSCOPY;  Service: Gastroenterology;  Laterality: N/A;   CORONARY STENT INTERVENTION N/A 05/22/2019   Procedure: CORONARY STENT INTERVENTION;  Surgeon: Wellington Hampshire, MD;  Location: [place] CV LAB;  Service: Cardiovascular;  Laterality: N/A;   MASS EXCISION Right 01/18/2013   Procedure: EXCISION right scapular cyst;  Surgeon: Ralene Ok, MD;  Location: [facility] ORS;  Service: General;  Laterality: Right;   RIGHT HEART CATH Right 10/19/2022   Procedure: RIGHT HEART CATH;  Surgeon: Nelva Bush, MD;  Location: [place] CV LAB;  Service: Cardiovascular;  Laterality: Right;   RIGHT/LEFT HEART CATH AND CORONARY ANGIOGRAPHY N/A 05/22/2019   Procedure: RIGHT/LEFT HEART CATH AND CORONARY ANGIOGRAPHY;  Surgeon: Wellington Hampshire, MD;  Location: [place] CV LAB;  Service: Cardiovascular;  Laterality: N/A;    Current Medications: Current Meds  Medication Sig   aspirin 81 MG  chewable tablet Chew 1 tablet (81 mg total) by mouth daily.   atorvastatin (LIPITOR) 80 MG tablet TAKE 1 TABLET BY MOUTH DAILY AT 6 PM.   calcium-vitamin D 250-100 MG-UNIT tablet Take 1 tablet by mouth daily.   empagliflozin (JARDIANCE) 10 MG TABS tablet Take 1 tablet (10 mg total) by mouth daily before breakfast.   ezetimibe (ZETIA) 10 MG tablet TAKE 1 TABLET BY MOUTH EVERY DAY   ferrous sulfate 325 (65 FE) MG tablet Take 325 mg by mouth 3 (three) times a week. No set days   furosemide (LASIX) 40 MG tablet Take 1 tablet (40 mg total) by mouth daily.   isosorbide mononitrate (IMDUR) 30 MG 24 hr tablet TAKE 1/2 TABLET BY MOUTH DAILY   losartan (COZAAR) 100 MG tablet TAKE 1 TABLET BY MOUTH EVERY DAY FOR BLOOD PRESSURE (PHARMACY NOT IN NETWORK)   metFORMIN (GLUCOPHAGE-XR) 500 MG 24 hr tablet TAKE 1 TABLET (500 MG TOTAL) BY MOUTH DAILY WITH BREAKFAST. FOR DIABETES.   methimazole (TAPAZOLE) 5 MG tablet Take 2 tablets (10 mg total) by mouth 2 (two) times daily.   metoprolol  succinate (TOPROL-XL) 25 MG 24 hr tablet TAKE 1 TABLET BY MOUTH EVERY DAY   nitroGLYCERIN (NITROSTAT) 0.4 MG SL tablet Place 1 tablet (0.4 mg total) under the tongue every 5 (five) minutes as needed for chest pain.   potassium chloride (KLOR-CON) 10 MEQ tablet Take 1 tablet (10 mEq total) by mouth daily.   spironolactone (ALDACTONE) 25 MG tablet TAKE 1/2 TABLET BY MOUTH DAILY    Allergies:   Patient has no known allergies.   Social History   Socioeconomic History   Marital status: Married    Spouse name: Not on file   Number of children: 2   Years of education: Not on file   Highest education level: Not on file  Occupational History    Comment: front desk   Tobacco Use   Smoking status: Never   Smokeless tobacco: Never  Vaping Use   Vaping Use: Never used  Substance and Sexual Activity   Alcohol use: Yes    Alcohol/week: 0.0 standard drinks of alcohol    Comment: rare   Drug use: No   Sexual activity: Not  Currently  Other Topics Concern   Not on file  Social History Narrative   From [place]   Married.   2 children.   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   Social Determinants of Health   Financial Resource Strain: Not on file  Food Insecurity: Not on file  Transportation Needs: Not on file  Physical Activity: Not on file  Stress: Not on file  Social Connections: Not on file     Family History:  The patient's family history includes Diabetes in her father, mother, and sister; Heart disease in her mother; Hypertension in her sister.  ROS:   12-point review of systems is negative unless otherwise noted in the HPI.   EKGs/Labs/Other Studies Reviewed:    Studies reviewed were summarized above. The additional studies were reviewed today:  [place] 10/19/2022: Conclusions: Moderately elevated left heart and pulmonary artery pressures. Moderately to severely elevated right heart filling pressures. Normal cardiac output/index.   Recommendations: Escalate diuresis; furosemide increased to 40 mg daily. Optimize goal-directed medical therapy for chronic heart failure with recovered ejection fraction __________   2D echo 08/09/2022: 1. Left ventricular ejection fraction, by estimation, is 55 to 60%. The  left ventricle has normal function. The left ventricle has no regional  wall motion abnormalities. There is mild left ventricular hypertrophy.  Left ventricular diastolic parameters  were normal.   2. Right ventricular systolic function is normal. The right ventricular  size is normal. There is moderately elevated pulmonary artery systolic  pressure. The estimated right ventricular systolic pressure is 0000000 mmHg.   3. The mitral valve is normal in structure. Mild mitral valve  regurgitation. No evidence of mitral stenosis.   4. The aortic valve has an indeterminant number of cusps. Aortic valve  regurgitation is not  visualized. No aortic stenosis is present.   5. There is borderline dilatation of the ascending aorta, measuring 37  mm.   6. The inferior vena cava is normal in size with greater than 50%  respiratory variability, suggesting right atrial pressure of 3 mmHg.  __________   [facility] MPI 02/19/2020: There was no ST segment deviation noted during stress. There are severe perfusion defects of mild to moderate size present in the mid to apical anteroseptal wall, basal to mid lateral wall, and the true apex. Findings consistent with  prior myocardial infarction. The left ventricular ejection fraction is normal (54%). This is a low to intermediate risk study. There was no evidence for ischemia __________   2D echo 02/12/2020: 1. Left ventricular ejection fraction, by estimation, is 50 to 55%. The  left ventricle has low normal function. The left ventricle has no regional  wall motion abnormalities. Indeterminate diastolic filling due to E-A  fusion.   2. Right ventricular systolic function is normal. The right ventricular  size is normal.   3. Left atrial size was mildly dilated.   4. The mitral valve is normal in structure. No evidence of mitral valve  regurgitation.   5. The aortic valve is grossly normal. Aortic valve regurgitation is not  visualized.  __________   2D echo 08/08/2019: 1. Left ventricular ejection fraction, by visual estimation, is 40 to  45%. The left ventricle has severely decreased function. Mildly increased  left ventricular size. There is no left ventricular hypertrophy.   2. Left ventricular diastolic Doppler parameters are consistent with  pseudonormalization pattern of LV diastolic filling.   3. Global right ventricle has normal systolic function.The right  ventricular size is normal. No increase in right ventricular wall  thickness.   4. Left atrial size was mildly dilated.   5. Mildly elevated pulmonary artery systolic pressure.  __________   [facility]  05/22/2019: 1st Mrg lesion is 100% stenosed. 2nd Mrg lesion is 100% stenosed. Mid LAD lesion is 80% stenosed. Post intervention, there is a 0% residual stenosis. A drug-eluting stent was successfully placed using a STENT RESOLUTE ONYX 3.0X18. 2nd Diag lesion is 30% stenosed. Prox RCA to Mid RCA lesion is 50% stenosed. RPAV lesion is 40% stenosed. RPDA lesion is 30% stenosed.   1.  Significant two-vessel coronary artery disease with 80% mid LAD stenosis, occluded OM branches and moderate RCA disease. 2.  Left ventricular angiography was not performed.  EF was severely reduced by echo. 3.  Right heart catheterization showed high normal filling pressures with mean RA pressure of 8 mmHg, pulmonary capillary wedge pressure of 12 to 13 mmHg, mild pulmonary hypertension and high cardiac output at 8 L/min. 4.  Successful angioplasty and drug-eluting stent placement to the mid LAD.   Recommendations: Continue dual antiplatelet therapy for at least 1 year. The patient needs aggressive medical therapy for the rest of her coronary artery disease.  I added high-dose atorvastatin. I added oral metoprolol to control tachycardia. I discontinued IV furosemide for now and she might require resumption of this either intravenously or orally as needed. The patient was alert throughout the procedure .  Her hemodynamics are good enough to allow extubation if no other issues. __________   2D echo 05/21/2019: 1. The left ventricle has severely reduced systolic function, with an  ejection fraction of 25-30%. The cavity size was moderately dilated. Left  ventricular diastolic parameters were normal.   2. 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   3. The right ventricle has normal systolic function. The cavity was  normal. There is no increase in right ventricular wall thickness.   4. The aortic valve is tricuspid. Mild thickening of the aortic valve.   5. The aorta is normal in size and  structure.    EKG:  EKG is not ordered today.    Recent Labs: 09/21/2022: ALT 18; Platelets 301 10/19/2022: Hemoglobin 12.9 10/31/2022: TSH 0.00 11/15/2022: BUN 17; Creatinine, Ser 0.84; Potassium 3.7; Sodium 140  Recent Lipid Panel    Component Value Date/Time  CHOL 153 08/17/2022 1150   CHOL 176 01/05/2021 1504   TRIG 120.0 08/17/2022 1150   HDL 44.90 08/17/2022 1150   HDL 49 01/05/2021 1504   CHOLHDL 3 08/17/2022 1150   VLDL 24.0 08/17/2022 1150   LDLCALC 84 08/17/2022 1150   LDLCALC 98 01/05/2021 1504    PHYSICAL EXAM:    VS:  BP (!) 130/90 (BP Location: Left Arm, Patient Position: Sitting, Cuff Size: Large)   Pulse 84   Ht 5' 4"$  (1.626 m)   Wt 246 lb (111.6 kg)   LMP 05/19/2021 (Approximate)   SpO2 97%   BMI 42.23 kg/m   BMI: Body mass index is 42.23 kg/m.  Physical Exam Vitals reviewed.  Constitutional:      Appearance: She is well-developed.  HENT:     Head: Normocephalic and atraumatic.  Eyes:     General:        Right eye: No discharge.        Left eye: No discharge.  Neck:     Thyroid: Thyromegaly present.     Vascular: No JVD.  Cardiovascular:     Rate and Rhythm: Normal rate and regular rhythm.     Heart sounds: Normal heart sounds, S1 normal and S2 normal. Heart sounds not distant. No midsystolic click and no opening snap. No murmur heard.    No friction rub.  Pulmonary:     Effort: Pulmonary effort is normal. No respiratory distress.     Breath sounds: Normal breath sounds. No decreased breath sounds, wheezing or rales.  Chest:     Chest wall: No tenderness.  Abdominal:     General: There is no distension.  Musculoskeletal:     Cervical back: Normal range of motion.     Right lower leg: No edema.     Left lower leg: No edema.  Skin:    General: Skin is warm and dry.     Nails: There is no clubbing.  Neurological:     Mental Status: She is alert and oriented to person, place, and time.  Psychiatric:        Speech: Speech normal.         Behavior: Behavior normal.        Thought Content: Thought content normal.        Judgment: Judgment normal.     Wt Readings from Last 3 Encounters:  12/05/22 246 lb (111.6 kg)  11/15/22 248 lb 6.4 oz (112.7 kg)  11/01/22 247 lb (112 kg)     ASSESSMENT & PLAN:   CAD involving the native coronary arteries without angina: She is doing well from a cardiac perspective and remains without symptoms of angina.  Continue aggressive risk factor modification and secondary prevention including aspirin, atorvastatin, ezetimibe, isosorbide mononitrate and metoprolol succinate.  No indication for further ischemic testing at this time.  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  She remains on Toprol-XL, losartan Jardiance, and spironolactone with low-dose furosemide.  Recent labs stable.  Pulmonary hypertension: Not a candidate for pulmonary artery vasodilators based on hemodynamics.  Underlying etiology likely thyroid disorder, HFpEF, obesity, and OSA.  Will need weight loss and treatment of sleep apnea with CPAP as outlined below.  Continue medical therapy as outlined above.  HTN: Blood pressure is reasonably controlled in the office today.  She remains on losartan Toprol-XL, and Imdur.  HLD: LDL 84 in 08/2022 with normal AST/ALT in 09/2022.  She remains on  atorvastatin and ezetimibe.  Hyperthyroidism: TSH remains suppressed.  She remains on methimazole.  Follow-up with endocrinology as directed.  Obesity: Weight loss is encouraged.  Would benefit from GLP-1 agonist.  I have advised her to follow-up with her PCP for discussion of this.  If she fails medical therapy, would recommend referral to bariatric medicine.  OSA: Awaiting CPAP.   Disposition: F/u with Dr. Fletcher Anon or an APP in 3 months.   Medication Adjustments/Labs and Tests Ordered: Current medicines are reviewed at length with the patient today.  Concerns regarding  medicines are outlined above. Medication changes, Labs and Tests ordered today are summarized above and listed in the Patient Instructions accessible in Encounters.   SignedChristell Faith, PA-C 12/05/2022 11:52 AM     [facility] [address] [place] [address] [place], [place] [address] [phone]

## 2022-12-05 ENCOUNTER — Encounter: Payer: Self-pay | Admitting: Physician Assistant

## 2022-12-05 ENCOUNTER — Ambulatory Visit: Payer: Commercial Managed Care - PPO | Attending: Physician Assistant | Admitting: Physician Assistant

## 2022-12-05 VITALS — BP 130/90 | HR 84 | Ht 64.0 in | Wt 246.0 lb

## 2022-12-05 DIAGNOSIS — E0590 Thyrotoxicosis, unspecified without thyrotoxic crisis or storm: Secondary | ICD-10-CM

## 2022-12-05 DIAGNOSIS — I272 Pulmonary hypertension, unspecified: Secondary | ICD-10-CM

## 2022-12-05 DIAGNOSIS — I5020 Unspecified systolic (congestive) heart failure: Secondary | ICD-10-CM | POA: Diagnosis not present

## 2022-12-05 DIAGNOSIS — Z6841 Body Mass Index (BMI) 40.0 and over, adult: Secondary | ICD-10-CM

## 2022-12-05 DIAGNOSIS — I10 Essential (primary) hypertension: Secondary | ICD-10-CM

## 2022-12-05 DIAGNOSIS — E785 Hyperlipidemia, unspecified: Secondary | ICD-10-CM

## 2022-12-05 DIAGNOSIS — I2510 Atherosclerotic heart disease of native coronary artery without angina pectoris: Secondary | ICD-10-CM | POA: Diagnosis not present

## 2022-12-05 DIAGNOSIS — G4733 Obstructive sleep apnea (adult) (pediatric): Secondary | ICD-10-CM

## 2022-12-05 NOTE — Patient Instructions (Signed)
Medication Instructions:  Your physician recommends that you continue on your current medications as directed. Please refer to the Current Medication list given to you today.  *If you need a refill on your cardiac medications before your next appointment, please call your pharmacy*   Lab Work: No labs ordered  If you have labs (blood work) drawn today and your tests are completely normal, you will receive your results only by: [place] (if you have MyChart) OR A paper copy in the mail If you have any lab test that is abnormal or we need to change your treatment, we will call you to review the results.   Testing/Procedures: No labs ordered  Follow-Up: At [facility], you and your health needs are our priority.  As part of our continuing mission to provide you with exceptional heart care, we have created designated Provider Care Teams.  These Care Teams include your primary Cardiologist (physician) and Advanced Practice Providers (APPs -  Physician Assistants and Nurse Practitioners) who all work together to provide you with the care you need, when you need it.  We recommend signing up for the patient portal called "MyChart".  Sign up information is provided on this After Visit Summary.  MyChart is used to connect with patients for Virtual Visits (Telemedicine).  Patients are able to view lab/test results, encounter notes, upcoming appointments, etc.  Non-urgent messages can be sent to your provider as well.   To learn more about what you can do with MyChart, go to [email or web address].    Your next appointment:   3 month(s)  Provider:   You may see [name], MD or one of the following Advanced Practice Providers on your designated Care Team:   Murray Hodgkins, NP Christell Faith, PA-C Cadence Kathlen Mody, PA-C Gerrie Nordmann, NP

## 2022-12-12 ENCOUNTER — Telehealth: Payer: Self-pay | Admitting: Primary Care

## 2022-12-12 ENCOUNTER — Encounter: Payer: Self-pay | Admitting: Student in an Organized Health Care Education/Training Program

## 2022-12-12 ENCOUNTER — Ambulatory Visit: Payer: Commercial Managed Care - PPO | Admitting: Student in an Organized Health Care Education/Training Program

## 2022-12-12 VITALS — BP 128/76 | HR 72 | Temp 97.9°F | Ht 64.0 in | Wt 246.2 lb

## 2022-12-12 DIAGNOSIS — R0602 Shortness of breath: Secondary | ICD-10-CM

## 2022-12-12 DIAGNOSIS — I272 Pulmonary hypertension, unspecified: Secondary | ICD-10-CM | POA: Diagnosis not present

## 2022-12-12 DIAGNOSIS — G4733 Obstructive sleep apnea (adult) (pediatric): Secondary | ICD-10-CM | POA: Diagnosis not present

## 2022-12-12 NOTE — Progress Notes (Signed)
Synopsis: Referred in for shortness of breath by [name], NP  Assessment & Plan:   1. OSA (obstructive sleep apnea) 2. Pulmonary HTN ([place]) 3. Shortness of breath  She is presenting for the evaluation of shortness of breath in the setting of known CAD status post stenting and concern for pulmonary hypertension.   Right heart cath from 2020 was notable for PA mean of 28, PAOP of 12, transpulmonary gradient of 16, diastolic pulmonary gradient of 2, Fick cardiac output 8, pulmonary vascular resistance of 2 Wood units. Repeat RHC from 10/2022 showed a PA mean of 36, a PAOP of 27, transpulmonary gradient of 9 and a diastolic gradient of 3.  Workup for pulmonary hypertension has included a normal high resolution CT scan of the chest, normal VQ scan, and overall negative connective tissue disease workup (anti-TPO antibodies positive, patient known to have hyperthyroidism). Sleep study was positive, and she also has known heart failure (confirmed on RHC). Overall her pulmonary hypertension is driven by group 2 (CHF) and group 3 (OSA) physiology. She's been seen by our advanced heart failure team and continues to follow with general cardiology.   I have counseled the patient extensively on the importance of weight loss, salt restriction in her diet, as well as the need to use the CPAP machine.  Return in about 6 months (around 06/12/2023).  I spent 30 minutes caring for this patient today, including preparing to see the patient, obtaining a medical history , reviewing a separately obtained history, performing a medically appropriate examination and/or evaluation, counseling and educating the patient/family/caregiver, documenting clinical information in the electronic health record, and independently interpreting results (not separately reported/billed) and communicating results to the patient/family/caregiver  Armando Reichert, MD [facility] 12/12/2022 11:22 AM    End of  visit medications:  No orders of the defined types were placed in this encounter.    Current Outpatient Medications:    aspirin 81 MG chewable tablet, Chew 1 tablet (81 mg total) by mouth daily., Disp: 30 tablet, Rfl: 11   atorvastatin (LIPITOR) 80 MG tablet, TAKE 1 TABLET BY MOUTH DAILY AT 6 PM., Disp: 90 tablet, Rfl: 0   calcium-vitamin D 250-100 MG-UNIT tablet, Take 1 tablet by mouth daily., Disp: , Rfl:    empagliflozin (JARDIANCE) 10 MG TABS tablet, Take 1 tablet (10 mg total) by mouth daily before breakfast., Disp: 30 tablet, Rfl: 11   ezetimibe (ZETIA) 10 MG tablet, TAKE 1 TABLET BY MOUTH EVERY DAY, Disp: 30 tablet, Rfl: 6   ferrous sulfate 325 (65 FE) MG tablet, Take 325 mg by mouth 3 (three) times a week. No set days, Disp: , Rfl:    furosemide (LASIX) 40 MG tablet, Take 1 tablet (40 mg total) by mouth daily., Disp: 30 tablet, Rfl: 5   isosorbide mononitrate (IMDUR) 30 MG 24 hr tablet, TAKE 1/2 TABLET BY MOUTH DAILY, Disp: 45 tablet, Rfl: 2   losartan (COZAAR) 100 MG tablet, TAKE 1 TABLET BY MOUTH EVERY DAY FOR BLOOD PRESSURE (PHARMACY NOT IN NETWORK), Disp: 90 tablet, Rfl: 2   metFORMIN (GLUCOPHAGE-XR) 500 MG 24 hr tablet, TAKE 1 TABLET (500 MG TOTAL) BY MOUTH DAILY WITH BREAKFAST. FOR DIABETES., Disp: 90 tablet, Rfl: 1   methimazole (TAPAZOLE) 5 MG tablet, Take 2 tablets (10 mg total) by mouth 2 (two) times daily., Disp: 360 tablet, Rfl: 2   metoprolol succinate (TOPROL-XL) 25 MG 24 hr tablet, TAKE 1 TABLET BY MOUTH EVERY DAY, Disp: 30 tablet, Rfl: 2  nitroGLYCERIN (NITROSTAT) 0.4 MG SL tablet, Place 1 tablet (0.4 mg total) under the tongue every 5 (five) minutes as needed for chest pain., Disp: 25 tablet, Rfl: 1   potassium chloride (KLOR-CON) 10 MEQ tablet, Take 1 tablet (10 mEq total) by mouth daily., Disp: 90 tablet, Rfl: 3   spironolactone (ALDACTONE) 25 MG tablet, TAKE 1/2 TABLET BY MOUTH DAILY, Disp: 15 tablet, Rfl: 3 No current facility-administered medications for this  visit.  Facility-Administered Medications Ordered in Other Visits:    sodium chloride flush (NS) 0.9 % injection 3 mL, 3 mL, Intravenous, Q12H, Hammock, Sheri, NP   Subjective:   PATIENT ID: Amber [name] GENDER: female DOB: [birth year], MRN: [id number]  Chief Complaint  Patient presents with   Follow-up    Covid 3wk ago. Received cpap but has not started SOB with exertion occ.     HPI  Amber [name] is a pleasant 52 year old female presenting to clinic for follow up.  I first saw Amber [name] in December of 2023 for an evaluation of shortness of breath. At that time, I initiated a workup for pulmonary hypertension and she's since underwent a repeat RHC as well as been seen by our advanced heart failure team.  She had been seen by cardiology and followed for the last 2 years after she was diagnosed with a myocardial infarction and heart failure.  She had underwent cardiac catheterization in 2020 showing a PCWP of 12 with mild pulmonary hypertension. Repeat TTE in October of 2023 showed elevated RV pressures concerning for pulmonary hypertension prompting a referral to pulmonology for an evaluation.  Since our last visit, she underwent a repeat RHC that showed the following: RA 13, RV 47/15, PA 47/30 (36), PCWP 27, Fick CO 7.4, Fick CI 3.5. Prior RHC from 2020 had shown the following: RA 8, RV 36/6, PA 38/14 (28), PAOP 12, FICK CO 8. She's been seen by Dr. Haroldine Laws from the advanced heart failure team for an evaluation as well as by her general cardiology providers. She's maintained on diuretics. Furthermore, she underwent a sleep study that showed her to have sleep apnea and a CPAP machine was ordered. She received her CPAP over the weekend and hasn't started using it yet.   She reports shortness of breath that is exertional and happening on most days.  Some days feel worse than others.  She does not have any cough, sputum production, chest tightness, or chest pain. She denies any joint swelling,  warmth, or pain.  She does not have any worsening lower extremity edema.  She was healthy in her youth but does tell me that she was told she had asthma when she was a child and fully outgrew it. She did not have any other medical problems growing up.     She was born [place] she lived until [place].  She is a non-smoker and currently lives in an apartment complex.  She does not have any pets or birds.    Past medical history notable for NSTEMI, CAD (s/p PCI DES mid LAD (05/2019), HFrEF, HTN, T2DM, Hyperthyroidism, and Obesity.  Ancillary information including prior medications, full medical/surgical/family/social histories, and PFTs (when available) are listed below and have been reviewed.   Review of Systems  Constitutional:  Negative for chills, fever, malaise/fatigue and weight loss.  Respiratory:  Positive for shortness of breath.   Cardiovascular:  Negative for chest pain.  Skin:  Negative for rash.     Objective:  Vitals:   12/12/22 1038  BP: 128/76  Pulse: 72  Temp: 97.9 F (36.6 C)  TempSrc: Temporal  SpO2: 96%  Weight: 246 lb 3.2 oz (111.7 kg)  Height: '5\' 4"'$  (1.626 m)   96% on RA  BMI Readings from Last 3 Encounters:  12/12/22 42.26 kg/m  12/05/22 42.23 kg/m  11/15/22 42.64 kg/m   Wt Readings from Last 3 Encounters:  12/12/22 246 lb 3.2 oz (111.7 kg)  12/05/22 246 lb (111.6 kg)  11/15/22 248 lb 6.4 oz (112.7 kg)    Physical Exam Constitutional:      Appearance: She is obese. She is not ill-appearing.  HENT:     Head: Normocephalic.     Mouth/Throat:     Mouth: Mucous membranes are moist.  Eyes:     Extraocular Movements: Extraocular movements intact.  Cardiovascular:     Rate and Rhythm: Normal rate and regular rhythm.     Pulses: Normal pulses.     Heart sounds: Normal heart sounds.  Pulmonary:     Effort: Pulmonary effort is normal.     Breath sounds: Normal breath sounds. No wheezing or rales.   Abdominal:     General: There is distension.     Palpations: Abdomen is soft.  Neurological:     General: No focal deficit present.     Mental Status: She is alert and oriented to person, place, and time. Mental status is at baseline.     Ancillary Information    Past Medical History:  Diagnosis Date   Acute encephalopathy    Acute pulmonary edema (HCC)    Acute respiratory failure with hypoxia ([place]) 05/20/2019   Anemia    Aortic dilatation (HCC)    37 mm (ascending aorta) 07/2022   CAD (coronary artery disease)    s/p DES to mLAD in 2020, NSTEMI, HFrEF with improvement in EF.   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   Thyroid storm    Unexplained weight loss 03/27/2019   Valvular insufficiency    Mild MR mild TR noted on echocardiogram in October 2023.     Family History  Problem Relation Age of Onset   Diabetes Father    Diabetes Mother    Heart disease Mother    Diabetes Sister    Hypertension Sister      Past Surgical History:  Procedure Laterality Date   CARDIAC CATHETERIZATION     CESAREAN SECTION     x2   COLONOSCOPY N/A 06/24/2021   Procedure: COLONOSCOPY;  Surgeon: Jonathon Bellows, MD;  Location: [facility] ENDOSCOPY;  Service: Gastroenterology;  Laterality: N/A;   CORONARY STENT INTERVENTION N/A 05/22/2019   Procedure: CORONARY STENT INTERVENTION;  Surgeon: Wellington Hampshire, MD;  Location: [facility];  Service:  Cardiovascular;  Laterality: N/A;   MASS EXCISION Right 01/18/2013   Procedure: EXCISION right scapular cyst;  Surgeon: Ralene Ok, MD;  Location: [facility] ORS;  Service: General;  Laterality: Right;   RIGHT HEART CATH Right 10/19/2022   Procedure: RIGHT HEART CATH;  Surgeon: Nelva Bush, MD;  Location: [facility];  Service: Cardiovascular;  Laterality: Right;   RIGHT/LEFT HEART CATH AND CORONARY ANGIOGRAPHY N/A 05/22/2019   Procedure: RIGHT/LEFT HEART CATH AND CORONARY ANGIOGRAPHY;  Surgeon: Wellington Hampshire, MD;  Location: [place] [facility];  Service: Cardiovascular;  Laterality: N/A;    Social History   Socioeconomic History   Marital status: Married    Spouse name: Not on file   Number of children: 2   Years of education: Not on file   Highest education level: Not on file  Occupational History    Comment: front desk   Tobacco Use   Smoking status: Never   Smokeless tobacco: Never  Vaping Use   Vaping Use: Never used  Substance and Sexual Activity   Alcohol use: Yes    Alcohol/week: 0.0 standard drinks of alcohol    Comment: rare   Drug use: No   Sexual activity: Not Currently  Other Topics Concern   Not on file  Social History Narrative   From [place]   Married.   2 children.   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   Social Determinants of Health   Financial Resource Strain: Not on file  Food Insecurity: Not on file  Transportation Needs: Not on file  Physical Activity: Not on file  Stress: Not on file  Social Connections: Not on file  Intimate Partner Violence: Not on file     No Known Allergies   CBC    Component Value Date/Time   WBC 12.7 (H) 09/21/2022 1313   WBC 10.6 (H) 05/04/2022 1216   RBC 4.93 09/21/2022 1313   RBC 5.02 05/04/2022 1216   HGB 12.9 10/19/2022 1048   HGB 13.4 09/21/2022 1313   HGB 13.5 02/12/2014 0808   HCT 38.0 10/19/2022 1048   HCT 38.4 09/21/2022 1313    HCT 38.2 02/12/2014 0808   PLT 301 09/21/2022 1313   MCV 78 (L) 09/21/2022 1313   MCV 77.0 (L) 02/12/2014 0808   MCH 27.2 09/21/2022 1313   MCH 25.1 (L) 05/04/2022 1216   MCHC 34.9 09/21/2022 1313   MCHC 34.3 05/04/2022 1216   RDW 15.1 09/21/2022 1313   RDW 15.0 (H) 02/12/2014 0808   LYMPHSABS 4.2 (H) 09/21/2022 1313   LYMPHSABS 4.4 (H) 02/12/2014 0808   MONOABS 0.8 05/20/2021 0916   MONOABS 1.0 (H) 02/12/2014 0808   EOSABS 0.1 09/21/2022 1313   BASOSABS 0.1 09/21/2022 1313   BASOSABS 0.1 02/12/2014 0808    Pulmonary Functions Testing Results:    Latest Ref Rng & Units 11/08/2022    9:21 AM  PFT Results  FVC-Pre L 1.70   FVC-Predicted Pre % 48   FVC-Post L 1.85   FVC-Predicted Post % 52   Pre FEV1/FVC % % 88   Post FEV1/FCV % % 90   FEV1-Pre L 1.49   FEV1-Predicted Pre % 54   FEV1-Post L 1.67   DLCO uncorrected ml/min/mmHg 20.09   DLCO UNC% % 96   DLVA Predicted % 143   TLC L 3.48   TLC % Predicted % 68   RV % Predicted % 81     Outpatient Medications Prior to Visit  Medication Sig Dispense Refill   aspirin 81 MG chewable tablet Chew 1 tablet (81 mg  total) by mouth daily. 30 tablet 11   atorvastatin (LIPITOR) 80 MG tablet TAKE 1 TABLET BY MOUTH DAILY AT 6 PM. 90 tablet 0   calcium-vitamin D 250-100 MG-UNIT tablet Take 1 tablet by mouth daily.     empagliflozin (JARDIANCE) 10 MG TABS tablet Take 1 tablet (10 mg total) by mouth daily before breakfast. 30 tablet 11   ezetimibe (ZETIA) 10 MG tablet TAKE 1 TABLET BY MOUTH EVERY DAY 30 tablet 6   ferrous sulfate 325 (65 FE) MG tablet Take 325 mg by mouth 3 (three) times a week. No set days     furosemide (LASIX) 40 MG tablet Take 1 tablet (40 mg total) by mouth daily. 30 tablet 5   isosorbide mononitrate (IMDUR) 30 MG 24 hr tablet TAKE 1/2 TABLET BY MOUTH DAILY 45 tablet 2   losartan (COZAAR) 100 MG tablet TAKE 1 TABLET BY MOUTH EVERY DAY FOR BLOOD PRESSURE (PHARMACY NOT IN NETWORK) 90 tablet 2   metFORMIN  (GLUCOPHAGE-XR) 500 MG 24 hr tablet TAKE 1 TABLET (500 MG TOTAL) BY MOUTH DAILY WITH BREAKFAST. FOR DIABETES. 90 tablet 1   methimazole (TAPAZOLE) 5 MG tablet Take 2 tablets (10 mg total) by mouth 2 (two) times daily. 360 tablet 2   metoprolol succinate (TOPROL-XL) 25 MG 24 hr tablet TAKE 1 TABLET BY MOUTH EVERY DAY 30 tablet 2   nitroGLYCERIN (NITROSTAT) 0.4 MG SL tablet Place 1 tablet (0.4 mg total) under the tongue every 5 (five) minutes as needed for chest pain. 25 tablet 1   potassium chloride (KLOR-CON) 10 MEQ tablet Take 1 tablet (10 mEq total) by mouth daily. 90 tablet 3   spironolactone (ALDACTONE) 25 MG tablet TAKE 1/2 TABLET BY MOUTH DAILY 15 tablet 3   Facility-Administered Medications Prior to Visit  Medication Dose Route Frequency Provider Last Rate Last Admin   sodium chloride flush (NS) 0.9 % injection 3 mL  3 mL Intravenous Q12H Gerrie Nordmann, NP

## 2022-12-12 NOTE — Telephone Encounter (Signed)
Pt called in request to get an earlier appointment to discuss getting a prescription for Ozempic . Please advise [phone]

## 2022-12-13 NOTE — Telephone Encounter (Signed)
Unable to reach patient. Left voicemail to return call to our office.   

## 2022-12-13 NOTE — Telephone Encounter (Signed)
Called and notified patient that this is the next available slot we have for non acute appointments. She will call back to see if we have any cancellations.

## 2022-12-14 ENCOUNTER — Encounter: Payer: Self-pay | Admitting: Primary Care

## 2022-12-14 ENCOUNTER — Ambulatory Visit (INDEPENDENT_AMBULATORY_CARE_PROVIDER_SITE_OTHER): Payer: Commercial Managed Care - PPO | Admitting: Primary Care

## 2022-12-14 VITALS — BP 138/82 | HR 62 | Temp 97.5°F | Ht 64.0 in | Wt 247.0 lb

## 2022-12-14 DIAGNOSIS — Z6841 Body Mass Index (BMI) 40.0 and over, adult: Secondary | ICD-10-CM | POA: Diagnosis not present

## 2022-12-14 DIAGNOSIS — E1165 Type 2 diabetes mellitus with hyperglycemia: Secondary | ICD-10-CM

## 2022-12-14 LAB — POCT GLYCOSYLATED HEMOGLOBIN (HGB A1C): Hemoglobin A1C: 6.5 % — AB (ref 4.0–5.6)

## 2022-12-14 MED ORDER — SEMAGLUTIDE(0.25 OR 0.5MG/DOS) 2 MG/1.5ML ~~LOC~~ SOPN: PEN_INJECTOR | SUBCUTANEOUS | 0 refills | Status: DC

## 2022-12-14 NOTE — Progress Notes (Signed)
Subjective:    Patient ID: Amber [name], female    DOB: 01/08/1971, 52 y.o.   MRN: [id number]  HPI  Amber [name] is a very pleasant 52 y.o. female with a history of NSTEMI, hypertension, cardiac shock syndrome, hyperthyroidism, pulmonary hypertension, type 2 diabetes, hyperlipidemia, morbid obesity who presents today to discuss obesity.  Following with cardiology and pulmonology who recommended GLP-1 agonist for weight loss and diabetes. She is under a lot of stress, stress eats, eats poorly overall as she is "on the go" often.   Diet currently consists of:  Breakfast: Toast with butter, fast food Lunch: Rice with grilled or fried meat Dinner: Cafeteria at work  Desserts: None Beverages: Juice   Exercise: None  Currently managed on metformin XR 500 mg daily and Jardiance 10 mg daily for diabetes. Last A1C was 6.8 in November 2023. She has no history of pancreatitis or thyroid cancer.   Review of Systems  Cardiovascular:  Negative for chest pain.  Gastrointestinal:  Negative for abdominal pain, constipation and nausea.  Neurological:  Positive for dizziness.         Past Medical History:  Diagnosis Date   Acute encephalopathy    Acute pulmonary edema (HCC)    Acute respiratory failure with hypoxia ([place]) 05/20/2019   Anemia    Aortic dilatation (HCC)    37 mm (ascending aorta) 07/2022   CAD (coronary artery disease)    s/p DES to mLAD in 2020, NSTEMI, HFrEF with improvement in EF.   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   Thyroid storm    Unexplained weight loss 03/27/2019   Valvular insufficiency    Mild MR mild TR noted on echocardiogram in October 2023.    Social History   Socioeconomic History   Marital status: Married    Spouse name: Not on file   Number of children: 2   Years of education: Not on file   Highest education level: Not on file  Occupational History    Comment: front desk   Tobacco Use   Smoking status: Never   Smokeless tobacco: Never  Vaping Use   Vaping Use: Never used  Substance and Sexual Activity   Alcohol use: Yes    Alcohol/week: 0.0 standard drinks of alcohol    Comment: rare   Drug use: No   Sexual activity: Not Currently  Other Topics Concern   Not on file  Social History Narrative   From [place]   Married.   2 children.   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   Social Determinants of Health   Financial Resource Strain: Not on file  Food Insecurity: Not on file  Transportation Needs: Not on file  Physical Activity: Not on file  Stress: Not on file  Social Connections: Not on file  Intimate Partner Violence: Not  on file    Past Surgical History:  Procedure Laterality Date   CARDIAC CATHETERIZATION     CESAREAN SECTION     x2   COLONOSCOPY N/A 06/24/2021   Procedure: COLONOSCOPY;  Surgeon: Jonathon Bellows, MD;  Location: [facility] ENDOSCOPY;  Service: Gastroenterology;  Laterality: N/A;   CORONARY STENT INTERVENTION N/A 05/22/2019   Procedure: CORONARY STENT INTERVENTION;  Surgeon: Wellington Hampshire, MD;  Location: [place] [facility];  Service: Cardiovascular;  Laterality: N/A;   MASS EXCISION Right 01/18/2013   Procedure: EXCISION right scapular cyst;  Surgeon: Ralene Ok, MD;  Location: [facility];  Service: General;  Laterality: Right;   RIGHT HEART CATH Right 10/19/2022   Procedure: RIGHT HEART CATH;   Surgeon: Nelva Bush, MD;  Location: [place] [facility];  Service: Cardiovascular;  Laterality: Right;   RIGHT/LEFT HEART CATH AND CORONARY ANGIOGRAPHY N/A 05/22/2019   Procedure: RIGHT/LEFT HEART CATH AND CORONARY ANGIOGRAPHY;  Surgeon: Wellington Hampshire, MD;  Location: [place] [facility];  Service: Cardiovascular;  Laterality: N/A;    Family History  Problem Relation Age of Onset   Diabetes Father    Diabetes Mother    Heart disease Mother    Diabetes Sister    Hypertension Sister     No Known Allergies  Current Outpatient Medications on File Prior to Visit  Medication Sig Dispense Refill   aspirin 81 MG chewable tablet Chew 1 tablet (81 mg total) by mouth daily. 30 tablet 11   atorvastatin (LIPITOR) 80 MG tablet TAKE 1 TABLET BY MOUTH DAILY AT 6 PM. 90 tablet 0   calcium-vitamin D 250-100 MG-UNIT tablet Take 1 tablet by mouth daily.     empagliflozin (JARDIANCE) 10 MG TABS tablet Take 1 tablet (10 mg total) by mouth daily before breakfast. 30 tablet 11   ezetimibe (ZETIA) 10 MG tablet TAKE 1 TABLET BY MOUTH EVERY DAY 30 tablet 6   ferrous sulfate 325 (65 FE) MG tablet Take 325 mg by mouth 3 (three) times a week. No set days     furosemide (LASIX) 40 MG tablet Take 1 tablet (40 mg total) by mouth daily. 30 tablet 5   isosorbide mononitrate (IMDUR) 30 MG 24 hr tablet TAKE 1/2 TABLET BY MOUTH DAILY 45 tablet 2   losartan (COZAAR) 100 MG tablet TAKE 1 TABLET BY MOUTH EVERY DAY FOR BLOOD PRESSURE (PHARMACY NOT IN NETWORK) 90 tablet 2   metFORMIN (GLUCOPHAGE-XR) 500 MG 24 hr tablet TAKE 1 TABLET (500 MG TOTAL) BY MOUTH DAILY WITH BREAKFAST. FOR DIABETES. 90 tablet 1   methimazole (TAPAZOLE) 5 MG tablet Take 2 tablets (10 mg total) by mouth 2 (two) times daily. 360 tablet 2   metoprolol succinate (TOPROL-XL) 25 MG 24 hr tablet TAKE 1 TABLET BY MOUTH EVERY DAY 30 tablet 2   potassium chloride (KLOR-CON) 10 MEQ tablet Take 1 tablet (10 mEq total) by mouth daily. 90 tablet 3    spironolactone (ALDACTONE) 25 MG tablet TAKE 1/2 TABLET BY MOUTH DAILY 15 tablet 3   nitroGLYCERIN (NITROSTAT) 0.4 MG SL tablet Place 1 tablet (0.4 mg total) under the tongue every 5 (five) minutes as needed for chest pain. (Patient not taking: Reported on 12/14/2022) 25 tablet 1   Current Facility-Administered Medications on File Prior to Visit  Medication Dose Route Frequency Provider Last Rate Last Admin   sodium chloride flush (NS) 0.9 % injection 3 mL  3 mL Intravenous Q12H Hammock, Sheri, NP        BP 138/82  Pulse 62   Temp (!) 97.5 F (36.4 C) (Temporal)   Ht '5\' 4"'$  (1.626 m)   Wt 247 lb (112 kg)   LMP 05/19/2021 (Approximate)   SpO2 97%   BMI 42.40 kg/m  Objective:   Physical Exam Cardiovascular:     Rate and Rhythm: Normal rate and regular rhythm.  Pulmonary:     Effort: Pulmonary effort is normal.     Breath sounds: Normal breath sounds.  Musculoskeletal:     Cervical back: Neck supple.  Skin:    General: Skin is warm and dry.           Assessment & Plan:  Type 2 diabetes mellitus with hyperglycemia, without long-term current use of insulin (HCC) Assessment & Plan: Controlled with hemoglobin A1C reading of 6.5 today.   Long discussion with patient regarding her diet and exercise and encouraged patient to reduce fast food, rice, fried food and avoid drinking juice.   Given her significant cardiac history, coupled with diabetes history and obesity, we agree to start a GLP1 agonist.   Start Ozempic 0.25 mg once weekly x 4 weeks, then increase to 0.5 mg once weekly there after.   Continue Jardiance and Metformin.  Follow up in May for diabetes and weight.   I evaluated patient, was consulted regarding treatment, and agree with assessment and plan per Tinnie Gens, RN, DNP student.   [name], NP-C   Orders: -     POCT glycosylated hemoglobin (Hb A1C) -     Semaglutide(0.25 or 0.'5MG'$ /DOS); Inject 0.25 mg into the skin once weekly x 4 week, then increase  to 0.5 mg weekly thereafter for diabetes.  Dispense: 4.5 mL; Refill: 0  Morbid obesity with BMI of 40.0-44.9, adult (HCC) Assessment & Plan: Uncontrolled.   Long discussion with patient about monitoring diet closely and to mostly drink water. Also limit rice, fried food, pasta and juice.   Encouraged to start exercise.   Start Ozempic 0.25 mg weekly x 4 weeks, then increase to 0.5 mg weekly thereafter. Follow up in 3 months.  I evaluated patient, was consulted regarding treatment, and agree with assessment and plan per Tinnie Gens, RN, DNP student.   [name], NP-C          [name], NP

## 2022-12-14 NOTE — Assessment & Plan Note (Addendum)
Uncontrolled.   Long discussion with patient about monitoring diet closely and to mostly drink water. Also limit rice, fried food, pasta and juice.   Encouraged to start exercise.   Start Ozempic 0.25 mg weekly x 4 weeks, then increase to 0.5 mg weekly thereafter. Follow up in 3 months.  I evaluated patient, was consulted regarding treatment, and agree with assessment and plan per Tinnie Gens, RN, DNP student.   [name], NP-C

## 2022-12-14 NOTE — Progress Notes (Signed)
Established Patient Office Visit  Subjective   Patient ID: Amber [name], female    DOB: Mar 07, 1971  Age: 52 y.o. MRN: [id number]  Chief Complaint  Patient presents with   Weight Loss    Discuss weight loss medication     HPI  Amber [name] is a 52 year old female with past medical history of type 2 diabetes, NSTEMI, CAD, CHF, OSA, Hyperthyroidism, Hyperlipidemia, Pulmonary hypertension, CHF, Aortic dilatation, valvular insuffciency, murmur, who presents today to discuss weight loss medication.   She a right heart catherization done on 10/19/22. She was recently diagnosed with obstructive sleep apnea and she was given a cpap machine however she has not started using. She does report intermittent shortness of breath and it does not last long.   She states that she works two job and usually eats on the go. She eats whatever she can when she can.   Breakfast: Toast with butter; hashbrown, burrito. She does drink juice.   Lunch: rice with grilled or fried meat; home cooked  Dinner: usually eats at cafeteria; she usually eats grilled food or pasta.   She does not do regular exercise.   She has a history of type 2 diabetes and is currently managed on Jardiance 10 mg once daily and Metformin XR 500 mg once daily. Her last A1C was in November, 2023 and it was 6.8.   She was evaluated by cardiology on 12/05/22 and was told to follow up with PCP for consideration of GLP1 agonist.   Patient Active Problem List   Diagnosis Date Noted   Aortic dilatation ([place]) 08/29/2022   Murmur 08/29/2022   Valvular insufficiency 08/29/2022   Pulmonary hypertension, unspecified ([place]) 08/29/2022   OSA (obstructive sleep apnea) 08/29/2022   Morbid obesity with BMI of 40.0-44.9, adult ([place]) 08/17/2022   Snoring 05/19/2021   CHF (congestive heart failure) ([place])    Angina pectoris ([place]) 02/12/2020   CAD (coronary artery disease), native coronary artery 02/12/2020   Graves disease 06/04/2019   Cardiac shock  syndrome ([place])    Hypoxemia    Hyperthyroidism 05/20/2019   Elevated troponin 05/20/2019   Hypokalemia 05/20/2019   NSTEMI (non-ST elevated myocardial infarction) ([place]) 05/20/2019   Chronic right shoulder pain 11/28/2018   Type 2 diabetes mellitus with hyperglycemia ([place]) 12/05/2016   Encounter for annual general medical examination with abnormal findings in adult 12/04/2015   Hyperlipidemia 12/04/2015   Essential hypertension 08/20/2015   Leukocytosis 01/09/2013   Past Medical History:  Diagnosis Date   Acute encephalopathy    Acute pulmonary edema (HCC)    Acute respiratory failure with hypoxia ([place]) 05/20/2019   Anemia    Aortic dilatation (HCC)    37 mm (ascending aorta) 07/2022   CAD (coronary artery disease)    s/p DES to mLAD in 2020, NSTEMI, HFrEF with improvement in EF.   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  Thyroid storm    Unexplained weight loss 03/27/2019   Valvular insufficiency    Mild MR mild TR noted on echocardiogram in October 2023.   Past Surgical History:  Procedure Laterality Date   CARDIAC CATHETERIZATION     CESAREAN SECTION     x2   COLONOSCOPY N/A 06/24/2021   Procedure: COLONOSCOPY;  Surgeon: Jonathon Bellows, MD;  Location: [facility] ENDOSCOPY;  Service: Gastroenterology;  Laterality: N/A;   CORONARY STENT INTERVENTION N/A 05/22/2019   Procedure: CORONARY STENT INTERVENTION;  Surgeon: Wellington Hampshire, MD;  Location: [place] CV LAB;  Service: Cardiovascular;  Laterality: N/A;   MASS EXCISION Right 01/18/2013   Procedure: EXCISION right scapular cyst;  Surgeon: Ralene Ok, MD;  Location: [facility] ORS;  Service: General;  Laterality: Right;   RIGHT HEART CATH Right 10/19/2022   Procedure: RIGHT HEART CATH;  Surgeon: Nelva Bush, MD;  Location: [facility] LAB;  Service: Cardiovascular;  Laterality: Right;   RIGHT/LEFT HEART CATH AND CORONARY ANGIOGRAPHY N/A 05/22/2019   Procedure: RIGHT/LEFT HEART CATH AND CORONARY ANGIOGRAPHY;  Surgeon: Wellington Hampshire, MD;  Location: [facility];  Service: Cardiovascular;  Laterality: N/A;   Social History   Tobacco Use   Smoking status: Never   Smokeless tobacco: Never  Vaping Use   Vaping Use: Never used  Substance Use Topics   Alcohol use: Yes    Alcohol/week: 0.0 standard drinks of alcohol    Comment: rare   Drug use: No   Family History  Problem Relation Age of Onset   Diabetes Father    Diabetes Mother    Heart disease Mother    Diabetes Sister    Hypertension Sister    No Known Allergies    Review of Systems  Respiratory:  Positive for shortness of breath.   Cardiovascular:  Negative for chest pain.  Gastrointestinal:  Negative for abdominal pain, diarrhea, nausea and vomiting.  Neurological:  Negative for dizziness and headaches.      Objective:     BP 138/82   Pulse 62   Temp (!) 97.5 F (36.4 C) (Temporal)   Ht '5\' 4"'$  (1.626 m)   Wt 247 lb (112 kg)   LMP 05/19/2021 (Approximate)   SpO2 97%   BMI 42.40 kg/m  BP Readings from Last 3 Encounters:  12/14/22 138/82  12/12/22 128/76  12/05/22 (!) 130/90   Wt Readings from Last 3 Encounters:  12/14/22 247 lb (112 kg)  12/12/22 246 lb 3.2 oz (111.7 kg)  12/05/22 246 lb (111.6 kg)      Physical Exam Vitals and nursing note reviewed.  Constitutional:      Appearance: Normal appearance.  Cardiovascular:     Rate and Rhythm: Normal rate and regular rhythm.      Pulses: Normal pulses.     Heart sounds: Normal heart sounds.  Pulmonary:     Effort: Pulmonary effort is normal.     Breath sounds: Normal breath sounds.  Neurological:     Mental Status: She is alert and oriented to person, place, and time.  Psychiatric:        Mood and Affect: Mood normal.        Behavior: Behavior normal.      Results for orders placed or performed in visit on 12/14/22  POCT glycosylated hemoglobin (Hb A1C)  Result Value Ref Range   Hemoglobin A1C 6.5 (A) 4.0 - 5.6 %   HbA1c POC (<> result, manual entry)     HbA1c,  POC (prediabetic range)     HbA1c, POC (controlled diabetic range)         The ASCVD Risk score (Arnett DK, et al., 2019) failed to calculate for the following reasons:   The patient has a prior MI or stroke diagnosis    Assessment & Plan:   Problem List Items Addressed This Visit       Endocrine   Type 2 diabetes mellitus with hyperglycemia ([place]) - Primary    Controlled with hemoglobin A1C reading of 6.5 today.   Long discussion with patient regarding her diet and exercise and encouraged patient to monitor her diet and avoid drinking juice.   Given her significant cardiac history, we agree to start a GLP1 agonist recommended by cardiology.   Start Ozempic 0.25 mg once weekly and then increase to 0.5 mg once weekly there after.   Follow up in May for diabetes and weight.       Relevant Medications   Semaglutide,0.25 or 0.'5MG'$ /DOS, 2 MG/1.5ML SOPN   Other Relevant Orders   POCT glycosylated hemoglobin (Hb A1C) (Completed)     Other   Morbid obesity with BMI of 40.0-44.9, adult ([place])    Uncontrolled.   Long discussion with patient about monitoring diet closely and increasing water intake and limit rice, pasta and juice.   Encouraged to start exercise.       Relevant Medications   Semaglutide,0.25 or 0.'5MG'$ /DOS, 2 MG/1.5ML SOPN    Return in about 3 months (around 03/14/2023).   Tinnie Gens, BSN-RN, DNP STUDENT

## 2022-12-14 NOTE — Patient Instructions (Signed)
Start Ozempic 0.25 mg once weekly for four weeks and then increase to 0.5 mg once weekly thereafter. Please update if you are not able to tolerate the medication or if there are any insurance issues.   It is important that you improve your diet. Please limit carbohydrates in the form of white bread, rice, pasta, sweets, fast food, fried food, sugary drinks, etc. Increase your consumption of fresh fruits and vegetables, whole grains, lean protein.  Ensure you are consuming 64 ounces of water daily.   Follow up in May.

## 2022-12-14 NOTE — Assessment & Plan Note (Addendum)
Controlled with hemoglobin A1C reading of 6.5 today.   Long discussion with patient regarding her diet and exercise and encouraged patient to reduce fast food, rice, fried food and avoid drinking juice.   Given her significant cardiac history, coupled with diabetes history and obesity, we agree to start a GLP1 agonist.   Start Ozempic 0.25 mg once weekly x 4 weeks, then increase to 0.5 mg once weekly there after.   Continue Jardiance and Metformin.  Follow up in May for diabetes and weight.   I evaluated patient, was consulted regarding treatment, and agree with assessment and plan per Tinnie Gens, RN, DNP student.   [name], NP-C

## 2023-01-04 ENCOUNTER — Ambulatory Visit: Payer: Commercial Managed Care - PPO | Admitting: Primary Care

## 2023-01-31 ENCOUNTER — Encounter: Payer: Self-pay | Admitting: Primary Care

## 2023-01-31 ENCOUNTER — Ambulatory Visit (INDEPENDENT_AMBULATORY_CARE_PROVIDER_SITE_OTHER): Payer: Commercial Managed Care - PPO | Admitting: Primary Care

## 2023-01-31 ENCOUNTER — Ambulatory Visit (INDEPENDENT_AMBULATORY_CARE_PROVIDER_SITE_OTHER)
Admission: RE | Admit: 2023-01-31 | Discharge: 2023-01-31 | Disposition: A | Discharge status: Home / Self Care | Payer: Commercial Managed Care - PPO | Source: Ambulatory Visit | Attending: Primary Care | Admitting: Primary Care

## 2023-01-31 ENCOUNTER — Other Ambulatory Visit: Payer: Self-pay | Admitting: Primary Care

## 2023-01-31 VITALS — BP 146/84 | HR 79 | Temp 97.7°F | Ht 64.0 in | Wt 232.0 lb

## 2023-01-31 DIAGNOSIS — R051 Acute cough: Secondary | ICD-10-CM

## 2023-01-31 LAB — POC COVID19 BINAXNOW: SARS Coronavirus 2 Ag: NEGATIVE

## 2023-01-31 MED ORDER — AMOXICILLIN-POT CLAVULANATE 875-125 MG PO TABS: 1.0000 | ORAL_TABLET | Freq: Two times a day (BID) | ORAL | 0 refills | Status: DC

## 2023-01-31 MED ORDER — HYDROCOD POLI-CHLORPHE POLI ER 10-8 MG/5ML PO SUER: 5.0000 mL | Freq: Two times a day (BID) | ORAL | 0 refills | Status: DC | PRN

## 2023-01-31 NOTE — Patient Instructions (Signed)
Complete xray(s) prior to leaving today. I will notify you of your results once received.  You may take the cough suppressant every 12 hours as needed for cough and rest. Caution this medication contains codeine which may cause drowsiness.   It was a pleasure to see you today!

## 2023-01-31 NOTE — Assessment & Plan Note (Addendum)
Questionable lung sounds. Will rule out pneumonia, especially with slightly lower than usual pulse oxygenation level.  Chest xray pending. Tussionex provided to use HS PRN, per patient request.  Await results.   Continue conservative treatment for now. Covid-19 negative today.

## 2023-01-31 NOTE — Progress Notes (Signed)
Subjective:    Patient ID: Amber [name], female    DOB: 04-23-1971, 52 y.o.   MRN: [id number]  Cough Associated symptoms include a sore throat. Pertinent negatives include no chills, fever, postnasal drip or shortness of breath.    Amber [name] is a very pleasant 52 y.o. female with a history of hypertension, CAD, CHF, hyperthyroidism, type 2 diabetes, OSA, pulmonary hypertension, who presents today to discuss cough.   Symptom onset five days ago with cough. She then developed chest and nasal congestion, sore throat, back pain from coughing.   She's been taking Robitussin, Nyquil, and pseudoephedrine, and Mucinex DM with temporary improvement. Today she's feeling slightly better. The cough is worse at night.   She denies fevers, chills, body aches, chest tightness, shortness of breath. She has not tested for Covid-19 infection. She had Covid-19 infection about 3 months ago, symptoms were overall mild.     Review of Systems  Constitutional:  Positive for fatigue. Negative for chills and fever.  HENT:  Positive for congestion and sore throat. Negative for postnasal drip.   Respiratory:  Positive for cough. Negative for chest tightness and shortness of breath.          Past Medical History:  Diagnosis Date   Acute encephalopathy    Acute pulmonary edema    Acute respiratory failure with hypoxia 05/20/2019   Anemia    Aortic dilatation    37 mm (ascending aorta) 07/2022   CAD (coronary artery disease)    s/p DES to mLAD in 2020, NSTEMI, HFrEF with improvement in EF.   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   Thyroid storm    Unexplained weight loss 03/27/2019   Valvular insufficiency    Mild MR mild TR noted on echocardiogram in October 2023.    Social History   Socioeconomic History   Marital status: Married    Spouse name: Not on file   Number of children: 2   Years of education: Not on file   Highest education level: Not on file  Occupational History    Comment: front desk   Tobacco Use   Smoking status: Never   Smokeless tobacco: Never  Vaping Use   Vaping Use: Never used  Substance and Sexual Activity   Alcohol use: Yes    Alcohol/week: 0.0 standard drinks of alcohol    Comment: rare   Drug use: No   Sexual activity: Not Currently  Other Topics Concern   Not on file  Social History Narrative   From [place].   Married.   2 children.   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   Social Determinants of Health   Financial Resource Strain: Not on file  Food Insecurity: Not on file  Transportation Needs: Not on file  Physical Activity: Not on file  Stress: Not on file  Social  Connections: Not on file  Intimate Partner Violence: Not on file    Past Surgical History:  Procedure Laterality Date   CARDIAC CATHETERIZATION     CESAREAN SECTION     x2   COLONOSCOPY N/A 06/24/2021   Procedure: COLONOSCOPY;  Surgeon: [name], MD;  Location: [facility] ENDOSCOPY;  Service: Gastroenterology;  Laterality: N/A;   CORONARY STENT INTERVENTION N/A 05/22/2019   Procedure: CORONARY STENT INTERVENTION;  Surgeon: Iran Ouch, MD;  Location: [facility] INVASIVE CV LAB;  Service: Cardiovascular;  Laterality: N/A;   MASS EXCISION Right 01/18/2013   Procedure: EXCISION right scapular cyst;  Surgeon: Axel Filler, MD;  Location: [facility] ORS;  Service: General;  Laterality: Right;   RIGHT HEART CATH  Right 10/19/2022   Procedure: RIGHT HEART CATH;  Surgeon: Yvonne Kendall, MD;  Location: [facility] INVASIVE CV LAB;  Service: Cardiovascular;  Laterality: Right;   RIGHT/LEFT HEART CATH AND CORONARY ANGIOGRAPHY N/A 05/22/2019   Procedure: RIGHT/LEFT HEART CATH AND CORONARY ANGIOGRAPHY;  Surgeon: Iran Ouch, MD;  Location: [facility] INVASIVE CV LAB;  Service: Cardiovascular;  Laterality: N/A;    Family History  Problem Relation Age of Onset   Diabetes Father    Diabetes Mother    Heart disease Mother    Diabetes Sister    Hypertension Sister     No Known Allergies  Current Outpatient Medications on File Prior to Visit  Medication Sig Dispense Refill   aspirin 81 MG chewable tablet Chew 1 tablet (81 mg total) by mouth daily. 30 tablet 11   atorvastatin (LIPITOR) 80 MG tablet TAKE 1 TABLET BY MOUTH DAILY AT 6 PM. 90 tablet 0   calcium-vitamin D 250-100 MG-UNIT tablet Take 1 tablet by mouth daily.     empagliflozin (JARDIANCE) 10 MG TABS tablet Take 1 tablet (10 mg total) by mouth daily before breakfast. 30 tablet 11   ezetimibe (ZETIA) 10 MG tablet TAKE 1 TABLET BY MOUTH EVERY DAY 30 tablet 6   ferrous sulfate 325 (65 FE) MG tablet Take 325 mg by mouth 3 (three) times a week. No set days     furosemide (LASIX) 40 MG tablet Take 1 tablet (40 mg total) by mouth daily. 30 tablet 5   isosorbide mononitrate (IMDUR) 30 MG 24 hr tablet TAKE 1/2 TABLET BY MOUTH DAILY 45 tablet 2   losartan (COZAAR) 100 MG tablet TAKE 1 TABLET BY MOUTH EVERY DAY FOR BLOOD PRESSURE (PHARMACY NOT IN NETWORK) 90 tablet 2   metFORMIN (GLUCOPHAGE-XR) 500 MG 24 hr tablet TAKE 1 TABLET (500 MG TOTAL) BY MOUTH DAILY WITH BREAKFAST. FOR DIABETES. 90 tablet 1   methimazole (TAPAZOLE) 5 MG tablet Take 2 tablets (10 mg total) by mouth 2 (two) times daily. 360 tablet 2   metoprolol succinate (TOPROL-XL) 25 MG 24 hr tablet TAKE 1 TABLET BY MOUTH EVERY DAY 30 tablet 2   nitroGLYCERIN (NITROSTAT) 0.4 MG SL tablet Place 1 tablet (0.4 mg  total) under the tongue every 5 (five) minutes as needed for chest pain. 25 tablet 1   Semaglutide,0.25 or 0.5MG /DOS, 2 MG/1.5ML SOPN Inject 0.25 mg into the skin once weekly x 4 week, then increase to 0.5 mg weekly thereafter for diabetes. 4.5 mL 0   spironolactone (ALDACTONE) 25 MG tablet TAKE 1/2 TABLET BY MOUTH DAILY 15 tablet 3   potassium chloride (KLOR-CON) 10 MEQ tablet Take 1 tablet (10 mEq total) by mouth daily. 90 tablet 3   Current Facility-Administered Medications on File Prior to Visit  Medication Dose Route  Frequency Provider Last Rate Last Admin   sodium chloride flush (NS) 0.9 % injection 3 mL  3 mL Intravenous Q12H Hammock, Sheri, NP        BP (!) 146/84   Pulse 79   Temp 97.7 F (36.5 C) (Temporal)   Ht  (1.626 m)   Wt 232 lb (105.2 kg)   LMP 05/19/2021 (Approximate)   SpO2 95%   BMI 39.82 kg/m  Objective:   Physical Exam Constitutional:      Appearance: She is ill-appearing.  HENT:     Right Ear: Tympanic membrane and ear canal normal.     Left Ear: Tympanic membrane and ear canal normal.     Nose:     Right Sinus: No maxillary sinus tenderness or frontal sinus tenderness.     Left Sinus: No maxillary sinus tenderness or frontal sinus tenderness.     Mouth/Throat:     Pharynx: No posterior oropharyngeal erythema.  Eyes:     Conjunctiva/sclera: Conjunctivae normal.  Cardiovascular:     Rate and Rhythm: Normal rate and regular rhythm.  Pulmonary:     Effort: Pulmonary effort is normal.     Breath sounds: Examination of the right-upper field reveals rhonchi. Examination of the left-upper field reveals rhonchi. Examination of the right-lower field reveals rhonchi. Examination of the left-lower field reveals rhonchi. Rhonchi present. No wheezing.     Comments: Congested cough noted a few times during visit Musculoskeletal:     Cervical back: Neck supple.  Lymphadenopathy:     Cervical: No cervical adenopathy.  Skin:    General: Skin is warm and dry.            Assessment & Plan:  Acute cough Assessment & Plan: Questionable lung sounds. Will rule out pneumonia, especially with slightly lower than usual pulse oxygenation level.  Chest xray pending. Tussionex provided to use HS PRN, per patient request.  Await results.   Continue conservative treatment for now. Covid-19 negative today.  Orders: -     DG Chest 2 View -     Hydrocod Poli-Chlorphe Poli ER; Take 5 mLs by mouth every 12 (twelve) hours as needed for cough.  Dispense: 50 mL; Refill: 0 -     POC COVID-19 BinaxNow        Doreene Nest, NP

## 2023-02-21 ENCOUNTER — Ambulatory Visit: Payer: Commercial Managed Care - PPO | Admitting: Primary Care

## 2023-02-26 ENCOUNTER — Other Ambulatory Visit: Payer: Self-pay | Admitting: Primary Care

## 2023-02-26 DIAGNOSIS — E1165 Type 2 diabetes mellitus with hyperglycemia: Secondary | ICD-10-CM

## 2023-03-02 NOTE — Progress Notes (Signed)
Cardiology Office Note    Date:  03/07/2023   ID:  Amber [name], DOB 11/20/1970, MRN [id number]  PCP:  Doreene Nest, NP  Cardiologist:  Lorine Bears, MD  Electrophysiologist:  None   Chief Complaint: Follow up  History of Present Illness:   Amber [name] is a 52 y.o. female with history of CAD with NSTEMI status post PCI/DES to the LAD in 2020, HFrEF secondary to mixed ICM and NICM with recovered LV systolic function, pulmonary hypertension due to high output (thyroid/obesity) as well as WHO group II (diastolic CHF), and WHO group III (OSA/OHS), hyperthyroidism with previous thyroid storm, HTN, HLD, anemia, and obesity who presents for follow-up of CAD, cardiomyopathy, and pulmonary hypertension.   She was diagnosed with severe hyperthyroidism in 05/2019 and admitted with acute respiratory failure requiring mechanical ventilation.  Admission was complicated by NSTEMI and pulmonary edema.  Echo showed an EF of 25 to 30% with wall motion abnormalities suggestive of Takotsubo cardiomyopathy versus anterior infarct.  She underwent R/LHC and was found to have two-vessel CAD with 80% stenosis in the mid LAD, occluded OM branches, and moderate RCA disease.  PCI and DES placement was done to the mid LAD.  Heart failure medications were optimized and hypothyroidism was treated.  Echo in 07/2019 showed an EF of 40 to 45%.  She was admitted in 01/2020 with atypical chest pain and ruled out for MI.  Outpatient Lexiscan MPI showed evidence of prior anterior infarct without ischemia.  Echo showed an EF of 50 to 55% with no significant valvular abnormalities.   Echo in 07/2022 demonstrated an EF of 55 to 60%, no regional wall motion abnormalities, mild LVH, normal LV diastolic function parameters, normal RV systolic function and ventricular cavity size, moderately elevated PASP estimated at 54 mmHg, mild mitral regurgitation, borderline dilatation of the ascending aorta measuring 37 mm, and an estimated  right atrial pressure of 3 mmHg.   She has also been followed by pulmonology with VQ scan showing normal perfusion.  Resolution chest CT showed no findings suggestive of ILD with mild air trapping indicative of small airway disease, cardiomegaly, and coronary artery calcification with aortic atherosclerosis.     She was seen in the office on 10/13/2022 with continued shortness of breath and exertional dyspnea.  She underwent RHC on 10/19/2022 showed moderately elevated left heart and pulmonary artery pressures with moderately to severely elevated right heart filling pressures and normal cardiac output/index.  Recommendation was to escalate diuresis and optimize GDMT.   PFTs consistent with severe restrictive lung disease.  Sleep study positive for sleep apnea.   She was seen on 11/01/2022 and was without symptoms of angina or cardiac decompensation.  Her dyspnea was largely unchanged despite escalation of diuretic.  She was started on Jardiance and referred to the advanced heart failure clinic.  She was not a candidate for selective pulmonary artery vasodilators based on hemodynamics.  She was advised to follow-up with pulmonology for CPAP and PCP for consideration of GLP1 agonist.  She was last seen in the office on 12/05/2022 and reported since she has last been seen, she had been diagnosed with COVID.  Her dyspnea was stable.  She was without symptoms of angina or cardiac decompensation.  She was awaiting arrival of CPAP.  She comes in doing well from a cardiac perspective and is without symptoms of angina or cardiac decompensation.  Breathing is improving.  She is getting used to her CPAP.  Her weight is down 14 pounds  when compared to her clinic visit in 11/2022.  This is intentional through lifestyle modification and initiation of semaglutide.  No palpitations, dizziness, presyncope, or syncope.  No progressive orthopnea.  She is pleased with her improvement.   Labs independently reviewed: 11/2022 -  A1c 6.5 10/2022 - potassium 3.7, BUN 17, serum creatinine 0.84, free T4 normal, TSH 0.00 09/2022 - Hgb 13.4, PLT 301, albumin 4.4, AST/ALT normal 08/2022 - TC 153, TG 120, HDL 44, LDL 84  Past Medical History:  Diagnosis Date   Acute encephalopathy    Acute pulmonary edema (HCC)    Acute respiratory failure with hypoxia (HCC) 05/20/2019   Anemia    Aortic dilatation (HCC)    37 mm (ascending aorta) 07/2022   CAD (coronary artery disease)    s/p DES to mLAD in 2020, NSTEMI, HFrEF with improvement in EF.   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   Thyroid storm    Unexplained weight loss 03/27/2019   Valvular insufficiency    Mild MR mild TR noted on echocardiogram in October 2023.    Past Surgical History:  Procedure Laterality Date   CARDIAC CATHETERIZATION     CESAREAN SECTION     x2   COLONOSCOPY N/A 06/24/2021   Procedure: COLONOSCOPY;  Surgeon: [name], MD;  Location: [facility] ENDOSCOPY;  Service: Gastroenterology;  Laterality: N/A;   CORONARY STENT INTERVENTION N/A 05/22/2019   Procedure: CORONARY STENT INTERVENTION;  Surgeon: Iran Ouch, MD;  Location: [facility] INVASIVE CV LAB;  Service: Cardiovascular;  Laterality: N/A;   MASS EXCISION Right 01/18/2013   Procedure: EXCISION right scapular cyst;  Surgeon: Axel Filler, MD;  Location: [facility] ORS;  Service: General;  Laterality: Right;   RIGHT HEART CATH Right 10/19/2022   Procedure: RIGHT HEART  CATH;  Surgeon: Yvonne Kendall, MD;  Location: [facility] INVASIVE CV LAB;  Service: Cardiovascular;  Laterality: Right;   RIGHT/LEFT HEART CATH AND CORONARY ANGIOGRAPHY N/A 05/22/2019   Procedure: RIGHT/LEFT HEART CATH AND CORONARY ANGIOGRAPHY;  Surgeon: Iran Ouch, MD;  Location: [facility] INVASIVE CV LAB;  Service: Cardiovascular;  Laterality: N/A;    Current Medications: Current Meds  Medication Sig   amoxicillin-clavulanate (AUGMENTIN) 875-125 MG tablet Take 1 tablet by mouth 2 (two) times daily.   aspirin 81 MG chewable tablet Chew 1 tablet (81 mg total) by mouth daily.   atorvastatin (LIPITOR) 80 MG tablet TAKE 1 TABLET BY MOUTH DAILY AT 6 PM.   calcium-vitamin D 250-100 MG-UNIT tablet Take 1 tablet by mouth daily.   empagliflozin (JARDIANCE) 10 MG TABS tablet Take 1 tablet (10 mg total) by mouth daily before breakfast.   ezetimibe (ZETIA) 10 MG tablet TAKE 1 TABLET BY MOUTH EVERY DAY   ferrous sulfate 325 (65 FE) MG tablet Take 325 mg by mouth 3 (three) times a week. No set days   furosemide (LASIX) 40 MG tablet Take 1 tablet (40  mg total) by mouth daily.   isosorbide mononitrate (IMDUR) 30 MG 24 hr tablet TAKE 1/2 TABLET BY MOUTH DAILY   metFORMIN (GLUCOPHAGE-XR) 500 MG 24 hr tablet TAKE 1 TABLET (500 MG TOTAL) BY MOUTH DAILY WITH BREAKFAST. FOR DIABETES.   methimazole (TAPAZOLE) 5 MG tablet Take 2 tablets (10 mg total) by mouth 2 (two) times daily.   metoprolol succinate (TOPROL-XL) 25 MG 24 hr tablet TAKE 1 TABLET BY MOUTH EVERY DAY   nitroGLYCERIN (NITROSTAT) 0.4 MG SL tablet Place 1 tablet (0.4 mg total) under the tongue every 5 (five) minutes as needed for chest pain.   Semaglutide,0.25 or 0.5MG /DOS, 2 MG/1.5ML SOPN Inject 0.25 mg into the skin once weekly x 4 week, then increase to 0.5 mg weekly thereafter for diabetes.   spironolactone (ALDACTONE) 25 MG tablet TAKE 1/2 TABLET BY MOUTH DAILY   valsartan (DIOVAN) 160 MG tablet Take 1 tablet (160 mg total) by mouth daily.    [DISCONTINUED] losartan (COZAAR) 100 MG tablet TAKE 1 TABLET BY MOUTH EVERY DAY FOR BLOOD PRESSURE (PHARMACY NOT IN NETWORK)    Allergies:   Patient has no known allergies.   Social History   Socioeconomic History   Marital status: Married    Spouse name: Not on file   Number of children: 2   Years of education: Not on file   Highest education level: Not on file  Occupational History    Comment: front desk   Tobacco Use   Smoking status: Never   Smokeless tobacco: Never  Vaping Use   Vaping Use: Never used  Substance and Sexual Activity   Alcohol use: Yes    Alcohol/week: 0.0 standard drinks of alcohol    Comment: rare   Drug use: No   Sexual activity: Not Currently  Other Topics Concern   Not on file  Social History Narrative   From [place].   Married.   2 children.   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   Social Determinants of Health   Financial Resource Strain: Not on file  Food Insecurity: Not on file  Transportation Needs: Not on file  Physical Activity: Not on file  Stress: Not on file  Social Connections: Not on file     Family History:  The patient's family history includes Diabetes in her father, mother, and sister; Heart disease in her mother; Hypertension in her sister.  ROS:   12-point review of systems is negative unless otherwise noted in the HPI.   EKGs/Labs/Other Studies Reviewed:    Studies reviewed were summarized above. The additional studies were reviewed today:  RHC 10/19/2022: Conclusions: Moderately elevated left heart and pulmonary artery pressures. Moderately to severely elevated right heart filling pressures. Normal cardiac output/index.   Recommendations: Escalate diuresis; furosemide increased to 40 mg daily. Optimize goal-directed medical therapy for chronic heart failure with recovered ejection fraction __________   2D echo 08/09/2022: 1. Left ventricular  ejection fraction, by estimation, is 55 to 60%. The  left ventricle has normal function. The left ventricle has no regional  wall motion abnormalities. There is mild left ventricular hypertrophy.  Left ventricular diastolic parameters  were normal.   2. Right ventricular systolic function is normal. The right ventricular  size is normal. There is moderately elevated pulmonary artery systolic  pressure. The estimated right ventricular systolic pressure is 54.0 mmHg.   3. The mitral valve is normal in structure. Mild mitral valve  regurgitation. No  evidence of mitral stenosis.   4. The aortic valve has an indeterminant number of cusps. Aortic valve  regurgitation is not visualized. No aortic stenosis is present.   5. There is borderline dilatation of the ascending aorta, measuring 37  mm.   6. The inferior vena cava is normal in size with greater than 50%  respiratory variability, suggesting right atrial pressure of 3 mmHg.  __________   Eugenie Birks MPI 02/19/2020: There was no ST segment deviation noted during stress. There are severe perfusion defects of mild to moderate size present in the mid to apical anteroseptal wall, basal to mid lateral wall, and the true apex. Findings consistent with prior myocardial infarction. The left ventricular ejection fraction is normal (54%). This is a low to intermediate risk study. There was no evidence for ischemia __________   2D echo 02/12/2020: 1. Left ventricular ejection fraction, by estimation, is 50 to 55%. The  left ventricle has low normal function. The left ventricle has no regional  wall motion abnormalities. Indeterminate diastolic filling due to E-A  fusion.   2. Right ventricular systolic function is normal. The right ventricular  size is normal.   3. Left atrial size was mildly dilated.   4. The mitral valve is normal in structure. No evidence of mitral valve  regurgitation.   5. The aortic valve is grossly normal. Aortic valve  regurgitation is not  visualized.  __________   2D echo 08/08/2019: 1. Left ventricular ejection fraction, by visual estimation, is 40 to  45%. The left ventricle has severely decreased function. Mildly increased  left ventricular size. There is no left ventricular hypertrophy.   2. Left ventricular diastolic Doppler parameters are consistent with  pseudonormalization pattern of LV diastolic filling.   3. Global right ventricle has normal systolic function.The right  ventricular size is normal. No increase in right ventricular wall  thickness.   4. Left atrial size was mildly dilated.   5. Mildly elevated pulmonary artery systolic pressure.  __________   [facility] 05/22/2019: 1st Mrg lesion is 100% stenosed. 2nd Mrg lesion is 100% stenosed. Mid LAD lesion is 80% stenosed. Post intervention, there is a 0% residual stenosis. A drug-eluting stent was successfully placed using a STENT RESOLUTE ONYX 3.0X18. 2nd Diag lesion is 30% stenosed. Prox RCA to Mid RCA lesion is 50% stenosed. RPAV lesion is 40% stenosed. RPDA lesion is 30% stenosed.   1.  Significant two-vessel coronary artery disease with 80% mid LAD stenosis, occluded OM branches and moderate RCA disease. 2.  Left ventricular angiography was not performed.  EF was severely reduced by echo. 3.  Right heart catheterization showed high normal filling pressures with mean RA pressure of 8 mmHg, pulmonary capillary wedge pressure of 12 to 13 mmHg, mild pulmonary hypertension and high cardiac output at 8 L/min. 4.  Successful angioplasty and drug-eluting stent placement to the mid LAD.   Recommendations: Continue dual antiplatelet therapy for at least 1 year. The patient needs aggressive medical therapy for the rest of her coronary artery disease.  I added high-dose atorvastatin. I added oral metoprolol to control tachycardia. I discontinued IV furosemide for now and she might require resumption of this either intravenously or orally as  needed. The patient was alert throughout the procedure .  Her hemodynamics are good enough to allow extubation if no other issues. __________   2D echo 05/21/2019: 1. The left ventricle has severely reduced systolic function, with an  ejection fraction of 25-30%. The cavity size was moderately dilated.  Left  ventricular diastolic parameters were normal.   2. 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   3. The right ventricle has normal systolic function. The cavity was  normal. There is no increase in right ventricular wall thickness.   4. The aortic valve is tricuspid. Mild thickening of the aortic valve.   5. The aorta is normal in size and structure.    EKG:  EKG is not ordered today.  The EKG ordered today demonstrates   Recent Labs: 09/21/2022: ALT 18; Platelets 301 10/19/2022: Hemoglobin 12.9 10/31/2022: TSH 0.00 11/15/2022: BUN 17; Creatinine, Ser 0.84; Potassium 3.7; Sodium 140  Recent Lipid Panel    Component Value Date/Time   CHOL 153 08/17/2022 1150   CHOL 176 01/05/2021 1504   TRIG 120.0 08/17/2022 1150   HDL 44.90 08/17/2022 1150   HDL 49 01/05/2021 1504   CHOLHDL 3 08/17/2022 1150   VLDL 24.0 08/17/2022 1150   LDLCALC 84 08/17/2022 1150   LDLCALC 98 01/05/2021 1504    PHYSICAL EXAM:    VS:  BP (!) 170/91 (BP Location: Right Arm, Patient Position: Sitting, Cuff Size: Large)   Pulse 61   Ht 5\' 4"  (1.626 m)   Wt 232 lb 12.8 oz (105.6 kg)   LMP 05/19/2021 (Approximate)   SpO2 98%   BMI 39.96 kg/m   BMI: Body mass index is 39.96 kg/m.  Physical Exam Constitutional:      Appearance: She is well-developed.  HENT:     Head: Normocephalic and atraumatic.  Eyes:     General:        Right eye: No discharge.        Left eye: No discharge.  Neck:     Thyroid: Thyromegaly present.     Vascular: No JVD.  Cardiovascular:     Rate and Rhythm: Normal rate and regular rhythm.     Heart sounds: Normal heart sounds, S1 normal and S2 normal.  Heart sounds not distant. No midsystolic click and no opening snap. No murmur heard.    No friction rub.  Pulmonary:     Effort: Pulmonary effort is normal. No respiratory distress.     Breath sounds: Normal breath sounds. No decreased breath sounds, wheezing or rales.  Chest:     Chest wall: No tenderness.  Abdominal:     General: There is no distension.  Musculoskeletal:     Cervical back: Normal range of motion.     Right lower leg: No edema.     Left lower leg: No edema.  Skin:    General: Skin is warm and dry.     Nails: There is no clubbing.  Neurological:     Mental Status: She is alert and oriented to person, place, and time.  Psychiatric:        Speech: Speech normal.        Behavior: Behavior normal.        Thought Content: Thought content normal.        Judgment: Judgment normal.     Wt Readings from Last 3 Encounters:  03/07/23 232 lb 12.8 oz (105.6 kg)  01/31/23 232 lb (105.2 kg)  12/14/22 247 lb (112 kg)     ASSESSMENT & PLAN:   CAD involving the native coronary arteries without angina: She is doing well from a cardiac perspective and remains without symptoms of angina or cardiac decompensation.  Continue aggressive risk factor modification and secondary prevention including aspirin, atorvastatin, ezetimibe, Imdur, and Toprol-XL.  No indication for further ischemic testing at this time.  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  She remains on Toprol-XL, Jardiance, spironolactone, and low-dose furosemide.  We are transitioning losartan to valsartan for added BP effect as outlined below.  Recent labs stable.  Pulmonary hypertension: Not a candidate for pulmonary artery vasodilators based on hemodynamics.  Underlying etiology felt to be thyroid disorder, HFpEF, obesity, and OSA.  Continued treatment of sleep apnea with CPAP and weight loss is encouraged.  HTN: Blood pressure is mildly elevated  in the office today.  Stop losartan.  Start valsartan 160 mg daily with a follow-up BMP 1 week thereafter.  Otherwise, continue spironolactone, Toprol-XL, and Imdur.  HLD: LDL 84 in 08/2022 with normal AST/ALT in 09/2022.  She remains on atorvastatin and ezetimibe.  Hyperthyroidism: TSH remains suppressed.  Remains on methimazole.  Followed by endocrinology.  Obesity: Her weight is down 14 pounds today when compared to revisit in 11/2022.  On semaglutide.  OSA: Adjusting to CPAP.   Disposition: F/u with Dr. Kirke Corin or an APP in 2 months.   Medication Adjustments/Labs and Tests Ordered: Current medicines are reviewed at length with the patient today.  Concerns regarding medicines are outlined above. Medication changes, Labs and Tests ordered today are summarized above and listed in the Patient Instructions accessible in Encounters.   SignedEula Listen, PA-C 03/07/2023 12:28 PM     [facility] [place] [address] [place], [place] [address] [phone]

## 2023-03-07 ENCOUNTER — Encounter: Payer: Self-pay | Admitting: Primary Care

## 2023-03-07 ENCOUNTER — Ambulatory Visit: Payer: Commercial Managed Care - PPO | Admitting: Primary Care

## 2023-03-07 ENCOUNTER — Encounter: Payer: Self-pay | Admitting: Physician Assistant

## 2023-03-07 ENCOUNTER — Ambulatory Visit: Payer: Commercial Managed Care - PPO | Attending: Physician Assistant | Admitting: Physician Assistant

## 2023-03-07 ENCOUNTER — Ambulatory Visit (INDEPENDENT_AMBULATORY_CARE_PROVIDER_SITE_OTHER): Payer: Commercial Managed Care - PPO | Admitting: Primary Care

## 2023-03-07 VITALS — BP 146/90 | HR 73 | Temp 97.6°F | Ht 64.0 in | Wt 232.0 lb

## 2023-03-07 VITALS — BP 170/91 | HR 61 | Ht 64.0 in | Wt 232.8 lb

## 2023-03-07 DIAGNOSIS — G4733 Obstructive sleep apnea (adult) (pediatric): Secondary | ICD-10-CM

## 2023-03-07 DIAGNOSIS — I2510 Atherosclerotic heart disease of native coronary artery without angina pectoris: Secondary | ICD-10-CM

## 2023-03-07 DIAGNOSIS — E1165 Type 2 diabetes mellitus with hyperglycemia: Secondary | ICD-10-CM | POA: Diagnosis not present

## 2023-03-07 DIAGNOSIS — Z7984 Long term (current) use of oral hypoglycemic drugs: Secondary | ICD-10-CM

## 2023-03-07 DIAGNOSIS — E785 Hyperlipidemia, unspecified: Secondary | ICD-10-CM

## 2023-03-07 DIAGNOSIS — Z6841 Body Mass Index (BMI) 40.0 and over, adult: Secondary | ICD-10-CM

## 2023-03-07 DIAGNOSIS — I5020 Unspecified systolic (congestive) heart failure: Secondary | ICD-10-CM | POA: Diagnosis not present

## 2023-03-07 DIAGNOSIS — Z7985 Long-term (current) use of injectable non-insulin antidiabetic drugs: Secondary | ICD-10-CM | POA: Diagnosis not present

## 2023-03-07 DIAGNOSIS — I10 Essential (primary) hypertension: Secondary | ICD-10-CM | POA: Diagnosis not present

## 2023-03-07 DIAGNOSIS — I272 Pulmonary hypertension, unspecified: Secondary | ICD-10-CM

## 2023-03-07 DIAGNOSIS — E0590 Thyrotoxicosis, unspecified without thyrotoxic crisis or storm: Secondary | ICD-10-CM

## 2023-03-07 LAB — POCT GLYCOSYLATED HEMOGLOBIN (HGB A1C): Hemoglobin A1C: 6 % — AB (ref 4.0–5.6)

## 2023-03-07 MED ORDER — SEMAGLUTIDE (1 MG/DOSE) 4 MG/3ML ~~LOC~~ SOPN: 1.0000 mg | PEN_INJECTOR | SUBCUTANEOUS | 0 refills | Status: DC

## 2023-03-07 MED ORDER — VALSARTAN 160 MG PO TABS: 160.0000 mg | ORAL_TABLET | Freq: Every day | ORAL | 3 refills | Status: DC

## 2023-03-07 NOTE — Patient Instructions (Signed)
We increased your dose of Ozempic to 1 mg weekly for diabetes and weight loss.  Continue to work on [employer].  Please schedule a physical to meet with me in 6 months.   It was a pleasure to see you today!

## 2023-03-07 NOTE — Patient Instructions (Signed)
Medication Instructions:  Your physician has recommended you make the following change in your medication:   STOP Losartan START Valsartan 160 mg once daily   *If you need a refill on your cardiac medications before your next appointment, please call your pharmacy*   Lab Work: BMET in one week. No appointment is needed. Go to the [facility] and check in at registration desk.   If you have labs (blood work) drawn today and your tests are completely normal, you will receive your results only by: MyChart Message (if you have MyChart) OR A paper copy in the mail If you have any lab test that is abnormal or we need to change your treatment, we will call you to review the results.   Testing/Procedures: None   Follow-Up: At [facility], you and your health needs are our priority.  As part of our continuing mission to provide you with exceptional heart care, we have created designated Provider Care Teams.  These Care Teams include your primary Cardiologist (physician) and Advanced Practice Providers (APPs -  Physician Assistants and Nurse Practitioners) who all work together to provide you with the care you need, when you need it.  We recommend signing up for the patient portal called "MyChart".  Sign up information is provided on this After Visit Summary.  MyChart is used to connect with patients for Virtual Visits (Telemedicine).  Patients are able to view lab/test results, encounter notes, upcoming appointments, etc.  Non-urgent messages can be sent to your provider as well.   To learn more about what you can do with MyChart, go to [email or web address].    Your next appointment:   2 month(s) The end of July. Needs to be before August   Provider:   Lorine Bears, MD or Eula Listen, PA-C

## 2023-03-07 NOTE — Progress Notes (Signed)
Subjective:    Patient ID: Nash Rubenacker, female    DOB: Jan 03, 1971, 52 y.o.   MRN: [id number]  HPI  Teala Kolbo is a very pleasant 52 y.o. female with a history of hypertension, cardiac shock, NSTEMI, pulmonary hypertension, OSA, type 2 diabetes, hyperthyroidism who presents today for follow up of diabetes.  Current medications include: Ozmempic 0.5 mg weekly, metformin XR 500 mg daily, Jardiance 10 mg daily  She is checking her blood glucose 3 times weekly and is getting readings of mid 100's.   Last A1C: 6.5 in February 2024, 6.0 today Last Eye Exam: UTD Last Foot Exam: UTD Pneumonia Vaccination: 2020 Urine Microalbumin: UTD Statin: atorvastatin   Dietary changes since last visit: She is not drinking soda or juice. She has cut back on rice.    Exercise: None.   BP Readings from Last 3 Encounters:  03/07/23 (!) 146/90  03/07/23 (!) 170/91  01/31/23 (!) 146/84    Wt Readings from Last 3 Encounters:  03/07/23 232 lb (105.2 kg)  03/07/23 232 lb 12.8 oz (105.6 kg)  01/31/23 232 lb (105.2 kg)      Review of Systems  Respiratory:  Negative for shortness of breath.   Cardiovascular:  Negative for chest pain.  Gastrointestinal:  Negative for constipation and nausea.  Neurological:  Negative for numbness.         Past Medical History:  Diagnosis Date   Acute encephalopathy    Acute pulmonary edema (HCC)    Acute respiratory failure with hypoxia (HCC) 05/20/2019   Anemia    Aortic dilatation (HCC)    37 mm (ascending aorta) 07/2022   CAD (coronary artery disease)    s/p DES to mLAD in 2020, NSTEMI, HFrEF with improvement in EF.   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   Thyroid storm    Unexplained weight loss 03/27/2019   Valvular insufficiency    Mild MR mild TR noted on echocardiogram in October 2023.    Social History   Socioeconomic History   Marital status: Married    Spouse name: Not on file   Number of children: 2   Years of education: Not on file   Highest education level: Not on file  Occupational History    Comment: front desk   Tobacco Use   Smoking status: Never   Smokeless tobacco: Never  Vaping Use   Vaping Use: Never used  Substance and Sexual Activity   Alcohol use: Yes    Alcohol/week: 0.0 standard drinks of alcohol    Comment: rare   Drug use: No   Sexual activity: Not Currently  Other Topics Concern   Not on file  Social History Narrative   From [place].   Married.   2 children.   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   Social Determinants  of Health   Financial Resource Strain: Not on file  Food Insecurity: Not on file  Transportation Needs: Not on file  Physical Activity: Not on file  Stress: Not on file  Social Connections: Not on file  Intimate Partner Violence: Not on file    Past Surgical History:  Procedure Laterality Date   CARDIAC CATHETERIZATION     CESAREAN SECTION     x2   COLONOSCOPY N/A 06/24/2021   Procedure: COLONOSCOPY;  Surgeon: [name], MD;  Location: [facility] ENDOSCOPY;  Service: Gastroenterology;  Laterality: N/A;   CORONARY STENT INTERVENTION N/A 05/22/2019   Procedure: CORONARY STENT INTERVENTION;  Surgeon: Iran Ouch, MD;  Location: [facility] INVASIVE CV LAB;  Service: Cardiovascular;  Laterality: N/A;   MASS EXCISION Right 01/18/2013   Procedure: EXCISION right scapular cyst;   Surgeon: Axel Filler, MD;  Location: [facility] ORS;  Service: General;  Laterality: Right;   RIGHT HEART CATH Right 10/19/2022   Procedure: RIGHT HEART CATH;  Surgeon: Yvonne Kendall, MD;  Location: [facility] INVASIVE CV LAB;  Service: Cardiovascular;  Laterality: Right;   RIGHT/LEFT HEART CATH AND CORONARY ANGIOGRAPHY N/A 05/22/2019   Procedure: RIGHT/LEFT HEART CATH AND CORONARY ANGIOGRAPHY;  Surgeon: Iran Ouch, MD;  Location: [facility] INVASIVE CV LAB;  Service: Cardiovascular;  Laterality: N/A;    Family History  Problem Relation Age of Onset   Diabetes Father    Diabetes Mother    Heart disease Mother    Diabetes Sister    Hypertension Sister     No Known Allergies  Current Outpatient Medications on File Prior to Visit  Medication Sig Dispense Refill   aspirin 81 MG chewable tablet Chew 1 tablet (81 mg total) by mouth daily. 30 tablet 11   atorvastatin (LIPITOR) 80 MG tablet TAKE 1 TABLET BY MOUTH DAILY AT 6 PM. 90 tablet 0   calcium-vitamin D 250-100 MG-UNIT tablet Take 1 tablet by mouth daily.     ezetimibe (ZETIA) 10 MG tablet TAKE 1 TABLET BY MOUTH EVERY DAY 30 tablet 6   ferrous sulfate 325 (65 FE) MG tablet Take 325 mg by mouth 3 (three) times a week. No set days     furosemide (LASIX) 40 MG tablet Take 1 tablet (40 mg total) by mouth daily. 30 tablet 5   isosorbide mononitrate (IMDUR) 30 MG 24 hr tablet TAKE 1/2 TABLET BY MOUTH DAILY 45 tablet 2   metFORMIN (GLUCOPHAGE-XR) 500 MG 24 hr tablet TAKE 1 TABLET (500 MG TOTAL) BY MOUTH DAILY WITH BREAKFAST. FOR DIABETES. 90 tablet 0   methimazole (TAPAZOLE) 5 MG tablet Take 2 tablets (10 mg total) by mouth 2 (two) times daily. 360 tablet 2   metoprolol succinate (TOPROL-XL) 25 MG 24 hr tablet TAKE 1 TABLET BY MOUTH EVERY DAY 30 tablet 2   nitroGLYCERIN (NITROSTAT) 0.4 MG SL tablet Place 1 tablet (0.4 mg total) under the tongue every 5 (five) minutes as needed for chest pain. 25 tablet 1   spironolactone (ALDACTONE) 25 MG tablet TAKE 1/2  TABLET BY MOUTH DAILY 15 tablet 3   valsartan (DIOVAN) 160 MG tablet Take 1 tablet (160 mg total) by mouth daily. 90 tablet 3   amoxicillin-clavulanate (AUGMENTIN) 875-125 MG tablet Take 1 tablet by mouth 2 (two) times daily. (Patient not taking: Reported on 03/07/2023) 20 tablet 0   chlorpheniramine-HYDROcodone (TUSSIONEX) 10-8 MG/5ML Take 5 mLs by mouth every 12 (twelve) hours as needed for cough. (Patient not taking: Reported on 03/07/2023) 50 mL 0   empagliflozin (JARDIANCE)  10 MG TABS tablet Take 1 tablet (10 mg total) by mouth daily before breakfast. (Patient not taking: Reported on 03/07/2023) 30 tablet 11   potassium chloride (KLOR-CON) 10 MEQ tablet Take 1 tablet (10 mEq total) by mouth daily. 90 tablet 3   Current Facility-Administered Medications on File Prior to Visit  Medication Dose Route Frequency Provider Last Rate Last Admin   sodium chloride flush (NS) 0.9 % injection 3 mL  3 mL Intravenous Q12H Hammock, Sheri, NP        BP (!) 146/90   Pulse 73   Temp 97.6 F (36.4 C) (Temporal)   Ht 5\' 4"  (1.626 m)   Wt 232 lb (105.2 kg)   LMP 05/19/2021 (Approximate)   SpO2 96%   BMI 39.82 kg/m  Objective:   Physical Exam Cardiovascular:     Rate and Rhythm: Normal rate and regular rhythm.  Pulmonary:     Effort: Pulmonary effort is normal.     Breath sounds: Normal breath sounds.  Musculoskeletal:     Cervical back: Neck supple.  Skin:    General: Skin is warm and dry.           Assessment & Plan:  Type 2 diabetes mellitus with hyperglycemia, without long-term current use of insulin (HCC) Assessment & Plan: Improved and controlled with A1C today of 6.0!!  Commended her on dietary changes!  Increase Ozempic to 1 mg for weight loss benefits. Consider further dose increase to 2 mg.  Continue metformin XR 500 mg daily and Jardiance 10 mg daily.  Follow up in 6 months.  Orders: -     POCT glycosylated hemoglobin (Hb A1C) -     Semaglutide (1 MG/DOSE); Inject 1 mg as  directed once a week. for diabetes.  Dispense: 9 mL; Refill: 0        Doreene Nest, NP

## 2023-03-07 NOTE — Assessment & Plan Note (Signed)
Improved and controlled with A1C today of 6.0!!  Commended her on dietary changes!  Increase Ozempic to 1 mg for weight loss benefits. Consider further dose increase to 2 mg.  Continue metformin XR 500 mg daily and Jardiance 10 mg daily.  Follow up in 6 months.

## 2023-03-20 ENCOUNTER — Ambulatory Visit: Payer: Commercial Managed Care - PPO | Admitting: Internal Medicine

## 2023-03-20 ENCOUNTER — Encounter: Payer: Self-pay | Admitting: Internal Medicine

## 2023-03-20 VITALS — BP 130/80 | HR 76 | Ht 64.0 in | Wt 230.0 lb

## 2023-03-20 DIAGNOSIS — E0590 Thyrotoxicosis, unspecified without thyrotoxic crisis or storm: Secondary | ICD-10-CM

## 2023-03-20 DIAGNOSIS — E0500 Thyrotoxicosis with diffuse goiter without thyrotoxic crisis or storm: Secondary | ICD-10-CM

## 2023-03-20 LAB — TSH: TSH: 0.19 u[IU]/mL — ABNORMAL LOW (ref 0.35–5.50)

## 2023-03-20 LAB — T4, FREE: Free T4: 0.7 ng/dL (ref 0.60–1.60)

## 2023-03-20 NOTE — Progress Notes (Unsigned)
Name: Amber [name]  MRN/ DOB: [id number], Feb 18, 1971    Age/ Sex: 52 y.o., female     PCP: Doreene Nest, NP   Reason for Endocrinology Evaluation: Hyperthytroidism     Initial Endocrinology Clinic Visit: 04/03/2019    PATIENT IDENTIFIER: Amber [name] is a 52 y.o., female with a past medical history of HTN and CAD (S/P PCI 05/2019).She has followed with [facility] since 04/03/2019 for consultative assistance with management of her Hyperthyrodism  HISTORICAL SUMMARY: The patient was first diagnosed with hyperthyroidism in 03/2019, with a suppressed TSH at < 0.01 uIU/mL with elevated FT4 5.96 ng/dL. She did have weight loss  For ~ 4 months prior to her presentation.  Methimazole started in 03/2019  SUBJECTIVE:     Today (03/20/2023):  Amber [name] is here for a follow up on hyperthyroidism and Graves' disease.  She follows with her PCP for diabetes management, she is on Ozempic and metformin as well as Jardiance Her last follow-up with cardiology was 03/07/2023  Patient has been noted with weight loss, she is on Ozempic Denies palpitations  Denies constipation or diarrhea  No burning or itching in the eyes Denies  local neck swelling  Denies tremors   Last eye exam 02/2022  Methimazole 5 mg, 2 tabs  BID    HISTORY:  Past Medical History:  Past Medical History:  Diagnosis Date   Acute encephalopathy    Acute pulmonary edema (HCC)    Acute respiratory failure with hypoxia (HCC) 05/20/2019   Anemia    Aortic dilatation (HCC)    37 mm (ascending aorta) 07/2022   CAD (coronary artery disease)    s/p DES to mLAD in 2020, NSTEMI, HFrEF with improvement in EF.   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   Thyroid storm    Unexplained weight loss 03/27/2019   Valvular insufficiency    Mild MR mild TR noted on echocardiogram in October 2023.   Past Surgical History:  Past Surgical History:  Procedure Laterality Date   CARDIAC CATHETERIZATION     CESAREAN SECTION     x2   COLONOSCOPY N/A 06/24/2021   Procedure: COLONOSCOPY;  Surgeon: [name], MD;  Location: [facility] ENDOSCOPY;  Service: Gastroenterology;  Laterality: N/A;   CORONARY STENT INTERVENTION N/A 05/22/2019   Procedure: CORONARY STENT INTERVENTION;  Surgeon: Iran Ouch, MD;  Location: [facility] INVASIVE CV LAB;  Service: Cardiovascular;  Laterality: N/A;   MASS EXCISION Right 01/18/2013   Procedure: EXCISION right scapular cyst;  Surgeon: Axel Filler, MD;  Location: [facility] ORS;  Service: General;  Laterality: Right;   RIGHT HEART CATH Right 10/19/2022   Procedure: RIGHT HEART CATH;  Surgeon: Yvonne Kendall, MD;  Location: [facility] INVASIVE CV LAB;  Service: Cardiovascular;  Laterality: Right;   RIGHT/LEFT HEART CATH AND CORONARY ANGIOGRAPHY N/A 05/22/2019   Procedure: RIGHT/LEFT HEART CATH AND CORONARY ANGIOGRAPHY;  Surgeon: Iran Ouch, MD;  Location: [facility] INVASIVE CV LAB;  Service: Cardiovascular;  Laterality: N/A;   Social History:  reports that she has never smoked. She has never used smokeless tobacco. She reports current alcohol use. She reports that she does not use drugs. Family History:  Family History  Problem Relation Age of Onset   Diabetes Father    Diabetes Mother    Heart disease Mother    Diabetes Sister    Hypertension Sister      HOME MEDICATIONS: Allergies as of 03/20/2023   No Known Allergies      Medication List         Accurate as of March 20, 2023 11:18 AM. If you have any questions, ask your nurse or doctor.          STOP taking these medications    amoxicillin-clavulanate 875-125 MG tablet Commonly known as: AUGMENTIN Stopped by: Scarlette Shorts, MD       TAKE these medications    aspirin 81 MG chewable tablet Chew 1 tablet (81 mg total) by mouth daily.   atorvastatin 80 MG tablet Commonly known as: LIPITOR TAKE 1 TABLET BY MOUTH DAILY AT 6 PM.   calcium-vitamin D 250-100 MG-UNIT tablet Take 1 tablet by mouth daily.   chlorpheniramine-HYDROcodone 10-8 MG/5ML Commonly known as: TUSSIONEX Take 5 mLs by mouth every 12 (twelve) hours as needed for cough.   empagliflozin 10 MG Tabs tablet Commonly known as: Jardiance Take 1 tablet (10 mg total) by mouth daily before breakfast.   ezetimibe 10 MG tablet Commonly known as: ZETIA TAKE 1 TABLET BY MOUTH EVERY DAY   ferrous sulfate 325 (65 FE) MG tablet Take 325 mg by mouth 3 (three) times a week. No set days   furosemide 40 MG tablet Commonly known as: LASIX Take 1 tablet (40 mg total) by mouth daily.   isosorbide mononitrate 30 MG 24 hr tablet Commonly known as: IMDUR TAKE 1/2 TABLET BY MOUTH DAILY   metFORMIN 500 MG 24 hr tablet Commonly known as: GLUCOPHAGE-XR TAKE 1 TABLET (500 MG TOTAL) BY MOUTH DAILY WITH BREAKFAST. FOR DIABETES.   methimazole 5 MG tablet Commonly known as: TAPAZOLE Take 2 tablets (10 mg total) by mouth 2 (two) times daily.   metoprolol succinate 25 MG 24 hr tablet Commonly known as: TOPROL-XL TAKE 1 TABLET BY MOUTH EVERY DAY   nitroGLYCERIN 0.4 MG SL tablet Commonly known as: Nitrostat Place 1 tablet (0.4 mg total) under the tongue every 5 (five) minutes as needed for chest pain.   potassium chloride 10 MEQ tablet Commonly known as: KLOR-CON Take 1 tablet (10 mEq total) by mouth daily.   Semaglutide (1 MG/DOSE) 4 MG/3ML Sopn Inject 1 mg as directed once a week. for diabetes.    spironolactone 25 MG tablet Commonly known as: ALDACTONE TAKE 1/2 TABLET BY MOUTH DAILY   valsartan 160 MG tablet Commonly known as: DIOVAN Take 1 tablet (160 mg total) by mouth daily.          OBJECTIVE:   PHYSICAL EXAM: VS: BP 130/80 (BP Location: Left Arm, Patient Position: Sitting, Cuff Size: Large)   Pulse 76   Ht 5\' 4"  (1.626 m)   Wt 230 lb (104.3 kg)   LMP 05/19/2021 (Approximate)   SpO2 96%   BMI 39.48 kg/m    EXAM: General: Pt appears well and is in NAD  Neck: General: Supple without adenopathy. Thyroid: Thyroid size is prominent.  Lungs: Clear with good BS bilat  Heart: Auscultation: RRR.  Extremities:  BL LE: No pretibial edema  Mental Status: Mood and affect: No depression, anxiety, or agitation     DATA REVIEWED:   Latest Reference Range & Units 03/20/23 11:35  TSH 0.35 - 5.50 uIU/mL 0.19 (L)  Triiodothyronine (T3) 76 - 181 ng/dL 94  Z6,XWRU(EAVWUJ) 8.11 - 1.60 ng/dL 9.14  (L): Data is abnormally low  Thyroid ultrasound 01/06/2022  Estimated total number of nodules >/= 1 cm: 0   Number of spongiform nodules >/=  2 cm not described below (TR1): 0   Number of mixed cystic and solid nodules >/= 1.5 cm not described below (TR2): 0   _________________________________________________________   Marked thyroid heterogeneity and mild diffuse enlargement. Background pseudo nodularity noted. No hypervascularity. No discrete nodule or focal abnormality. No regional adenopathy.   IMPRESSION: Heterogeneous mildly enlarged thyroid compatible with chronic medical thyroid disease.   Negative for nodule.    ASSESSMENT / PLAN / RECOMMENDATIONS:   Hyperthyroidism Secondary to Graves' Disease:   -Today we discussed alternative treatment options to include RAI ablation versus surgery versus continue methimazole -If alternative treatment is needed, I do recommend total thyroidectomy, due to the risk of Graves' orbitopathy with RAI, as the patient  already has mild right eye proptosis -TFTs are improving, will continue current dose   Medications  Continue methimazole 5 mg,2  tabs BID   2. Graves' Disease:   -Patient with minimal right eye stare   Follow-up in 4 months    Signed electronically by: Lyndle Herrlich, MD  [facility]  [facility] [address] [place] [place], [place] [address] Phone: [phone] FAX: [phone]      CC: Doreene Nest, NP [address] [place] [address] Phone: [phone]  Fax: [phone]   Return to Endocrinology clinic as below: Future Appointments  Date Time Provider Department Center  03/20/2023 11:30 AM Madaleine Simmon, Konrad Dolores, MD [id number] None  05/18/2023 10:30 AM Sondra Barges, PA-C [facility] None

## 2023-03-21 ENCOUNTER — Telehealth: Payer: Self-pay | Admitting: Internal Medicine

## 2023-03-21 LAB — T3: T3, Total: 94 ng/dL (ref 76–181)

## 2023-03-21 MED ORDER — METHIMAZOLE 5 MG PO TABS: 10.0000 mg | ORAL_TABLET | Freq: Two times a day (BID) | ORAL | 2 refills | Status: DC

## 2023-03-21 NOTE — Telephone Encounter (Signed)
Please let the patient know that her thyroid function is improving and to continue taking 4 tablets of methimazole daily      Thank you

## 2023-03-22 ENCOUNTER — Other Ambulatory Visit: Payer: Self-pay | Admitting: Primary Care

## 2023-03-22 DIAGNOSIS — E1165 Type 2 diabetes mellitus with hyperglycemia: Secondary | ICD-10-CM

## 2023-03-22 NOTE — Telephone Encounter (Signed)
Patient notified of test results and to continue taking the 4 tables.  She verbalized good understanding of this

## 2023-03-31 ENCOUNTER — Telehealth: Payer: Self-pay

## 2023-03-31 NOTE — Telephone Encounter (Signed)
Called pt to schedule a follow up appt. No answer left voice mail.

## 2023-04-29 ENCOUNTER — Other Ambulatory Visit (HOSPITAL_COMMUNITY): Payer: Self-pay

## 2023-04-29 ENCOUNTER — Telehealth: Payer: Self-pay | Admitting: Pharmacy Technician

## 2023-04-29 NOTE — Telephone Encounter (Signed)
Pharmacy Patient Advocate Encounter  Received notification from [facility] that Prior Authorization for Ozempic (1 MG/DOSE) 4MG /3ML pen-injectors has been APPROVED from 04/29/2023 to 04/28/2024.Marland Kitchen  PA #/Case ID/Reference #: [id number]  Copay is $30.00

## 2023-05-17 NOTE — Progress Notes (Unsigned)
Cardiology Office Note    Date:  05/18/2023   ID:  Amber [name], DOB 13-May-1971, MRN [id number]  PCP:  Doreene Nest, NP  Cardiologist:  Lorine Bears, MD  Electrophysiologist:  None   Chief Complaint: Follow up  History of Present Illness:   Amber [name] is a 52 y.o. female with history of CAD with NSTEMI status post PCI/DES to the LAD in 2020, HFrEF secondary to mixed ICM and NICM with recovered LV systolic function, pulmonary hypertension due to high output (thyroid/obesity) as well as WHO group II (diastolic CHF), and WHO group III (OSA/OHS), hyperthyroidism with previous thyroid storm, HTN, HLD, anemia, and obesity who presents for follow-up of CAD, cardiomyopathy, and pulmonary hypertension.   She was diagnosed with severe hyperthyroidism in 05/2019 and admitted with acute respiratory failure requiring mechanical ventilation.  Admission was complicated by NSTEMI and pulmonary edema.  Echo showed an EF of 25 to 30% with wall motion abnormalities suggestive of Takotsubo cardiomyopathy versus anterior infarct.  She underwent R/LHC and was found to have two-vessel CAD with 80% stenosis in the mid LAD, occluded OM branches, and moderate RCA disease.  PCI and DES placement was done to the mid LAD.  Heart failure medications were optimized and hypothyroidism was treated.  Echo in 07/2019 showed an EF of 40 to 45%.  She was admitted in 01/2020 with atypical chest pain and ruled out for MI.  Outpatient Lexiscan MPI showed evidence of prior anterior infarct without ischemia.  Echo showed an EF of 50 to 55% with no significant valvular abnormalities.   Echo in 07/2022 demonstrated an EF of 55 to 60%, no regional wall motion abnormalities, mild LVH, normal LV diastolic function parameters, normal RV systolic function and ventricular cavity size, moderately elevated PASP estimated at 54 mmHg, mild mitral regurgitation, borderline dilatation of the ascending aorta measuring 37 mm, and an estimated  right atrial pressure of 3 mmHg.   She has also been followed by pulmonology with VQ scan showing normal perfusion.  Resolution chest CT showed no findings suggestive of ILD with mild air trapping indicative of small airway disease, cardiomegaly, and coronary artery calcification with aortic atherosclerosis.     She was seen in the office on 10/13/2022 with continued shortness of breath and exertional dyspnea.  She underwent RHC on 10/19/2022 showed moderately elevated left heart and pulmonary artery pressures with moderately to severely elevated right heart filling pressures and normal cardiac output/index.  Recommendation was to escalate diuresis and optimize GDMT.   PFTs consistent with severe restrictive lung disease.  Sleep study positive for sleep apnea.   She was seen on 11/01/2022 with unchanged dyspnea despite escalation of diuretic.  She was started on Jardiance and referred to the advanced heart failure clinic.  She was not a candidate for selective pulmonary artery vasodilators based on hemodynamics.  She was advised to follow-up with pulmonology for CPAP and PCP for consideration of GLP1 agonist.  She was last seen in the office on 03/07/2023 and remained without symptoms of angina or cardiac decompensation.  Breathing was improving.  She was getting used to CPAP.  Her weight was down 14 pounds when compared to her visit in 11/2022, which was intentional through lifestyle modification and initiation of semaglutide.  She was placed on valsartan in place of losartan for further management of hypertension.  She comes in doing very well from a cardiac perspective and is without symptoms of angina or cardiac decompensation.  Breathing continues to improve.  Adherent  to CPAP.  Her weight is down another 4 pounds today when compared to her last clinic visit.  This is intentional through lifestyle modification and initiation of semaglutide.  No palpitations, dizziness, presyncope, or syncope.  No  progressive orthopnea.  No lower extremity swelling.  She is fatigued though reports work hours.  She is excited to be traveling back home for the month of August.   Labs independently reviewed: 03/2023 - TSH 0.19, free T4 normal, total T3 normal 02/2023 - A1c 6.0 10/2022 - potassium 3.7, BUN 17, serum creatinine 0.84 09/2022 - Hgb 13.4, PLT 301, albumin 4.4, AST/ALT normal 08/2022 - TC 153, TG 120, HDL 44, LDL 84  Past Medical History:  Diagnosis Date   Acute encephalopathy    Acute pulmonary edema (HCC)    Acute respiratory failure with hypoxia (HCC) 05/20/2019   Anemia    Aortic dilatation (HCC)    37 mm (ascending aorta) 07/2022   CAD (coronary artery disease)    s/p DES to mLAD in 2020, NSTEMI, HFrEF with improvement in EF.   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   Thyroid storm    Unexplained weight loss 03/27/2019   Valvular insufficiency    Mild MR mild TR noted on echocardiogram in October 2023.    Past Surgical History:  Procedure Laterality Date   CARDIAC CATHETERIZATION     CESAREAN SECTION     x2   COLONOSCOPY N/A 06/24/2021   Procedure: COLONOSCOPY;  Surgeon: [name], MD;  Location: [facility] ENDOSCOPY;  Service: Gastroenterology;  Laterality: N/A;   CORONARY STENT INTERVENTION N/A 05/22/2019   Procedure: CORONARY STENT INTERVENTION;  Surgeon: Iran Ouch, MD;  Location: [facility] INVASIVE CV LAB;  Service: Cardiovascular;  Laterality:  N/A;   MASS EXCISION Right 01/18/2013   Procedure: EXCISION right scapular cyst;  Surgeon: Axel Filler, MD;  Location: [facility] ORS;  Service: General;  Laterality: Right;   RIGHT HEART CATH Right 10/19/2022   Procedure: RIGHT HEART CATH;  Surgeon: Yvonne Kendall, MD;  Location: [facility] INVASIVE CV LAB;  Service: Cardiovascular;  Laterality: Right;   RIGHT/LEFT HEART CATH AND CORONARY ANGIOGRAPHY N/A 05/22/2019   Procedure: RIGHT/LEFT HEART CATH AND CORONARY ANGIOGRAPHY;  Surgeon: Iran Ouch, MD;  Location: [facility] INVASIVE CV LAB;  Service: Cardiovascular;  Laterality: N/A;    Current Medications: Current Meds  Medication Sig   aspirin 81 MG chewable tablet Chew 1 tablet (81 mg total) by mouth daily.   atorvastatin (LIPITOR) 80 MG tablet TAKE 1 TABLET BY MOUTH DAILY AT 6 PM.   calcium-vitamin D 250-100 MG-UNIT tablet Take 1 tablet by mouth daily.   chlorpheniramine-HYDROcodone (TUSSIONEX) 10-8 MG/5ML Take 5 mLs by mouth every 12 (twelve) hours as needed for cough.   empagliflozin (JARDIANCE) 10 MG TABS tablet Take 1 tablet (10 mg total) by mouth daily before breakfast.   ezetimibe (ZETIA) 10 MG tablet TAKE 1  TABLET BY MOUTH EVERY DAY   ferrous sulfate 325 (65 FE) MG tablet Take 325 mg by mouth 3 (three) times a week. No set days   furosemide (LASIX) 40 MG tablet Take 1 tablet (40 mg total) by mouth daily.   isosorbide mononitrate (IMDUR) 30 MG 24 hr tablet TAKE 1/2 TABLET BY MOUTH DAILY   metFORMIN (GLUCOPHAGE-XR) 500 MG 24 hr tablet TAKE 1 TABLET (500 MG TOTAL) BY MOUTH DAILY WITH BREAKFAST. FOR DIABETES.   methimazole (TAPAZOLE) 5 MG tablet Take 2 tablets (10 mg total) by mouth 2 (two) times daily.   metoprolol succinate (TOPROL-XL) 25 MG 24 hr tablet TAKE 1 TABLET BY MOUTH EVERY DAY   nitroGLYCERIN (NITROSTAT) 0.4 MG SL tablet Place 1 tablet (0.4 mg total) under the tongue every 5 (five) minutes as needed for chest pain.   Semaglutide, 1 MG/DOSE, 4 MG/3ML SOPN Inject 1 mg as directed once a  week. for diabetes.   spironolactone (ALDACTONE) 25 MG tablet TAKE 1/2 TABLET BY MOUTH DAILY   valsartan (DIOVAN) 160 MG tablet Take 1 tablet (160 mg total) by mouth daily.    Allergies:   Patient has no known allergies.   Social History   Socioeconomic History   Marital status: Married    Spouse name: Not on file   Number of children: 2   Years of education: Not on file   Highest education level: Not on file  Occupational History    Comment: front desk   Tobacco Use   Smoking status: Never   Smokeless tobacco: Never  Vaping Use   Vaping status: Never Used  Substance and Sexual Activity   Alcohol use: Yes    Alcohol/week: 0.0 standard drinks of alcohol    Comment: rare   Drug use: No   Sexual activity: Not Currently  Other Topics Concern   Not on file  Social History Narrative   From [place].   Married.   2 children.   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   Social Determinants of Health   Financial Resource Strain: Not on file  Food Insecurity: Not on file  Transportation Needs: Not on file  Physical Activity: Not on file  Stress: Not on file  Social Connections: Not on file     Family History:  The patient's family history includes Diabetes in her father, mother, and sister; Heart disease in her mother; Hypertension in her sister.  ROS:   12-point review of systems is negative unless otherwise noted in the HPI.   EKGs/Labs/Other Studies Reviewed:    Studies reviewed were summarized above. The additional studies were reviewed today:  RHC 10/19/2022: Conclusions: Moderately elevated left heart and pulmonary artery pressures. Moderately to severely elevated right heart filling pressures. Normal cardiac output/index.   Recommendations: Escalate diuresis; furosemide increased to 40 mg daily. Optimize goal-directed medical therapy for chronic heart failure with recovered ejection  fraction __________   2D echo 08/09/2022: 1. Left ventricular ejection fraction, by estimation, is 55 to 60%. The  left ventricle has normal function. The left ventricle has no regional  wall motion abnormalities. There is mild left ventricular hypertrophy.  Left ventricular diastolic parameters  were normal.   2. Right ventricular systolic function is normal. The right ventricular  size is normal. There is moderately elevated pulmonary artery systolic  pressure. The estimated right ventricular systolic pressure is 54.0 mmHg.   3. The mitral valve is normal in structure.  Mild mitral valve  regurgitation. No evidence of mitral stenosis.   4. The aortic valve has an indeterminant number of cusps. Aortic valve  regurgitation is not visualized. No aortic stenosis is present.   5. There is borderline dilatation of the ascending aorta, measuring 37  mm.   6. The inferior vena cava is normal in size with greater than 50%  respiratory variability, suggesting right atrial pressure of 3 mmHg.  __________   Eugenie Birks MPI 02/19/2020: There was no ST segment deviation noted during stress. There are severe perfusion defects of mild to moderate size present in the mid to apical anteroseptal wall, basal to mid lateral wall, and the true apex. Findings consistent with prior myocardial infarction. The left ventricular ejection fraction is normal (54%). This is a low to intermediate risk study. There was no evidence for ischemia __________   2D echo 02/12/2020: 1. Left ventricular ejection fraction, by estimation, is 50 to 55%. The  left ventricle has low normal function. The left ventricle has no regional  wall motion abnormalities. Indeterminate diastolic filling due to E-A  fusion.   2. Right ventricular systolic function is normal. The right ventricular  size is normal.   3. Left atrial size was mildly dilated.   4. The mitral valve is normal in structure. No evidence of mitral valve   regurgitation.   5. The aortic valve is grossly normal. Aortic valve regurgitation is not  visualized.  __________   2D echo 08/08/2019: 1. Left ventricular ejection fraction, by visual estimation, is 40 to  45%. The left ventricle has severely decreased function. Mildly increased  left ventricular size. There is no left ventricular hypertrophy.   2. Left ventricular diastolic Doppler parameters are consistent with  pseudonormalization pattern of LV diastolic filling.   3. Global right ventricle has normal systolic function.The right  ventricular size is normal. No increase in right ventricular wall  thickness.   4. Left atrial size was mildly dilated.   5. Mildly elevated pulmonary artery systolic pressure.  __________   [facility] 05/22/2019: 1st Mrg lesion is 100% stenosed. 2nd Mrg lesion is 100% stenosed. Mid LAD lesion is 80% stenosed. Post intervention, there is a 0% residual stenosis. A drug-eluting stent was successfully placed using a STENT RESOLUTE ONYX 3.0X18. 2nd Diag lesion is 30% stenosed. Prox RCA to Mid RCA lesion is 50% stenosed. RPAV lesion is 40% stenosed. RPDA lesion is 30% stenosed.   1.  Significant two-vessel coronary artery disease with 80% mid LAD stenosis, occluded OM branches and moderate RCA disease. 2.  Left ventricular angiography was not performed.  EF was severely reduced by echo. 3.  Right heart catheterization showed high normal filling pressures with mean RA pressure of 8 mmHg, pulmonary capillary wedge pressure of 12 to 13 mmHg, mild pulmonary hypertension and high cardiac output at 8 L/min. 4.  Successful angioplasty and drug-eluting stent placement to the mid LAD.   Recommendations: Continue dual antiplatelet therapy for at least 1 year. The patient needs aggressive medical therapy for the rest of her coronary artery disease.  I added high-dose atorvastatin. I added oral metoprolol to control tachycardia. I discontinued IV furosemide for now and  she might require resumption of this either intravenously or orally as needed. The patient was alert throughout the procedure .  Her hemodynamics are good enough to allow extubation if no other issues. __________   2D echo 05/21/2019: 1. The left ventricle has severely reduced systolic function, with an  ejection fraction of 25-30%.  The cavity size was moderately dilated. Left  ventricular diastolic parameters were normal.   2. 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   3. The right ventricle has normal systolic function. The cavity was  normal. There is no increase in right ventricular wall thickness.   4. The aortic valve is tricuspid. Mild thickening of the aortic valve.   5. The aorta is normal in size and structure.    EKG:  EKG is ordered today.  The EKG ordered today demonstrates NSR, 69 bpm, LVH with no acute ST-T changes, consistent with prior tracing  Recent Labs: 09/21/2022: ALT 18; Platelets 301 10/19/2022: Hemoglobin 12.9 11/15/2022: BUN 17; Creatinine, Ser 0.84; Potassium 3.7; Sodium 140 03/20/2023: TSH 0.19  Recent Lipid Panel    Component Value Date/Time   CHOL 153 08/17/2022 1150   CHOL 176 01/05/2021 1504   TRIG 120.0 08/17/2022 1150   HDL 44.90 08/17/2022 1150   HDL 49 01/05/2021 1504   CHOLHDL 3 08/17/2022 1150   VLDL 24.0 08/17/2022 1150   LDLCALC 84 08/17/2022 1150   LDLCALC 98 01/05/2021 1504    PHYSICAL EXAM:    VS:  BP 130/82 (BP Location: Left Arm, Patient Position: Sitting, Cuff Size: Large)   Pulse 69   Wt 228 lb 6.4 oz (103.6 kg)   LMP 05/19/2021 (Approximate)   SpO2 97%   BMI 39.20 kg/m   BMI: Body mass index is 39.2 kg/m.  Physical Exam Vitals reviewed.  Constitutional:      Appearance: She is well-developed.  HENT:     Head: Normocephalic and atraumatic.  Eyes:     General:        Right eye: No discharge.        Left eye: No discharge.  Neck:     Thyroid: Thyromegaly present.     Vascular: No JVD.   Cardiovascular:     Rate and Rhythm: Normal rate and regular rhythm.     Pulses:          Dorsalis pedis pulses are 2+ on the right side and 2+ on the left side.       Posterior tibial pulses are 2+ on the right side and 2+ on the left side.     Heart sounds: Normal heart sounds, S1 normal and S2 normal. Heart sounds not distant. No midsystolic click and no opening snap. No murmur heard.    No friction rub.  Pulmonary:     Effort: Pulmonary effort is normal. No respiratory distress.     Breath sounds: Normal breath sounds. No decreased breath sounds, wheezing or rales.  Chest:     Chest wall: No tenderness.  Abdominal:     General: There is no distension.  Musculoskeletal:     Cervical back: Normal range of motion.     Right lower leg: No edema.     Left lower leg: No edema.  Skin:    General: Skin is warm and dry.     Nails: There is no clubbing.  Neurological:     Mental Status: She is alert and oriented to person, place, and time.  Psychiatric:        Speech: Speech normal.        Behavior: Behavior normal.        Thought Content: Thought content normal.        Judgment: Judgment normal.     Wt Readings from Last 3 Encounters:  05/18/23 228 lb 6.4 oz (103.6 kg)  03/20/23 230 lb (104.3 kg)  03/07/23 232 lb (105.2 kg)     ASSESSMENT & PLAN:   CAD involving the native coronary arteries without angina: She is doing well from a cardiac perspective and remains without symptoms of angina or cardiac decompensation. Continue aggressive risk factor modification and secondary prevention including aspirin, atorvastatin, ezetimibe, Imdur, and Toprol-XL. No indication for further ischemic testing at this time.   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 She remains on valsartan, Toprol-XL, Jardiance, spironolactone, and low-dose furosemide.  Defer further escalation of GDMT at this time given normalization LV  systolic function, and in the context of lack of heart failure symptoms.  However, if her cardiomyopathy were to return, would like to escalate GDMT.  Check BMP.  Pulmonary hypertension: Not a candidate for pulmonary artery vasodilators based on hemodynamics.  Underlying etiology felt to be thyroid disorder, HFpEF, obesity, and OSA.  Continued treatment of sleep apnea with furosemide, CPAP and weight loss is encouraged.   HTN: Blood pressure improved and well-controlled in the office today.  She remains on valsartan, Toprol-XL, Imdur, and spironolactone.  HLD: LDL 84 in 08/2022 with normal AST/ALT in 09/2022.  She remains on atorvastatin and ezetimibe.  Hyperthyroidism: Remains on methimazole.  Followed by endocrinology.  Obesity: Her weight is improving with lifestyle modification and semaglutide.  OSA: CPAP.   Disposition: F/u with Dr. Kirke Corin or an APP in 6 months.   Medication Adjustments/Labs and Tests Ordered: Current medicines are reviewed at length with the patient today.  Concerns regarding medicines are outlined above. Medication changes, Labs and Tests ordered today are summarized above and listed in the Patient Instructions accessible in Encounters.   Signed, Eula Listen, PA-C 05/18/2023 12:44 PM     [facility] [place] [address] [place], [place] [address] [phone]

## 2023-05-18 ENCOUNTER — Other Ambulatory Visit: Payer: Self-pay

## 2023-05-18 ENCOUNTER — Encounter: Payer: Self-pay | Admitting: Physician Assistant

## 2023-05-18 ENCOUNTER — Ambulatory Visit: Payer: Commercial Managed Care - PPO | Attending: Physician Assistant | Admitting: Physician Assistant

## 2023-05-18 VITALS — BP 130/82 | HR 69 | Wt 228.4 lb

## 2023-05-18 DIAGNOSIS — I5020 Unspecified systolic (congestive) heart failure: Secondary | ICD-10-CM | POA: Diagnosis not present

## 2023-05-18 DIAGNOSIS — G4733 Obstructive sleep apnea (adult) (pediatric): Secondary | ICD-10-CM

## 2023-05-18 DIAGNOSIS — I2510 Atherosclerotic heart disease of native coronary artery without angina pectoris: Secondary | ICD-10-CM

## 2023-05-18 DIAGNOSIS — I272 Pulmonary hypertension, unspecified: Secondary | ICD-10-CM | POA: Diagnosis not present

## 2023-05-18 DIAGNOSIS — I10 Essential (primary) hypertension: Secondary | ICD-10-CM | POA: Diagnosis not present

## 2023-05-18 DIAGNOSIS — E0590 Thyrotoxicosis, unspecified without thyrotoxic crisis or storm: Secondary | ICD-10-CM

## 2023-05-18 DIAGNOSIS — E785 Hyperlipidemia, unspecified: Secondary | ICD-10-CM

## 2023-05-18 DIAGNOSIS — Z6841 Body Mass Index (BMI) 40.0 and over, adult: Secondary | ICD-10-CM

## 2023-05-18 LAB — BASIC METABOLIC PANEL
BUN/Creatinine Ratio: 21 (ref 9–23)
BUN: 13 mg/dL (ref 6–24)
CO2: 22 mmol/L (ref 20–29)
Calcium: 8.8 mg/dL (ref 8.7–10.2)
Chloride: 106 mmol/L (ref 96–106)
Creatinine, Ser: 0.63 mg/dL (ref 0.57–1.00)
Glucose: 118 mg/dL — ABNORMAL HIGH (ref 70–99)
Potassium: 4.2 mmol/L (ref 3.5–5.2)
Sodium: 141 mmol/L (ref 134–144)
eGFR: 107 mL/min/{1.73_m2} (ref >59)

## 2023-05-18 NOTE — Patient Instructions (Signed)
Medication Instructions:  Your physician recommends that you continue on your current medications as directed. Please refer to the Current Medication list given to you today.  *If you need a refill on your cardiac medications before your next appointment, please call your pharmacy*   Lab Work: BMP  If you have labs (blood work) drawn today and your tests are completely normal, you will receive your results only by: MyChart Message (if you have MyChart) OR A paper copy in the mail If you have any lab test that is abnormal or we need to change your treatment, we will call you to review the results.   Testing/Procedures:  NONE    Follow-Up: At [facility], you and your health needs are our priority.  As part of our continuing mission to provide you with exceptional heart care, we have created designated Provider Care Teams.  These Care Teams include your primary Cardiologist (physician) and Advanced Practice Providers (APPs -  Physician Assistants and Nurse Practitioners) who all work together to provide you with the care you need, when you need it.  We recommend signing up for the patient portal called "MyChart".  Sign up information is provided on this After Visit Summary.  MyChart is used to connect with patients for Virtual Visits (Telemedicine).  Patients are able to view lab/test results, encounter notes, upcoming appointments, etc.  Non-urgent messages can be sent to your provider as well.   To learn more about what you can do with MyChart, go to [email or web address].    Your next appointment:   6 month(s)  Provider:   You may see Lorine Bears, MD or one of the following Advanced Practice Providers on your designated Care Team:    Eula Listen, [place]

## 2023-05-19 ENCOUNTER — Telehealth: Payer: Self-pay | Admitting: *Deleted

## 2023-05-19 NOTE — Telephone Encounter (Signed)
Left voicemail message to call back for review of her results.  

## 2023-05-19 NOTE — Telephone Encounter (Signed)
-----   Message from Eula Listen sent at 05/19/2023  6:44 AM EDT ----- Please inform the patient her labs show stable and normal renal function with potassium at goal on current medical therapy.

## 2023-05-23 NOTE — Telephone Encounter (Signed)
Results reviewed with patient and she verbalized understanding with no further questions at this time.  °

## 2023-06-05 ENCOUNTER — Other Ambulatory Visit: Payer: Self-pay | Admitting: Primary Care

## 2023-06-05 DIAGNOSIS — E1165 Type 2 diabetes mellitus with hyperglycemia: Secondary | ICD-10-CM

## 2023-06-05 NOTE — Telephone Encounter (Signed)
 Patient is due for CPE/follow up in early November, this will be required prior to any further refills.  Please schedule, thank you!

## 2023-07-30 ENCOUNTER — Other Ambulatory Visit: Payer: Self-pay | Admitting: Primary Care

## 2023-07-30 ENCOUNTER — Other Ambulatory Visit: Payer: Self-pay | Admitting: Internal Medicine

## 2023-07-30 DIAGNOSIS — E1165 Type 2 diabetes mellitus with hyperglycemia: Secondary | ICD-10-CM

## 2023-07-30 NOTE — Telephone Encounter (Signed)
Patient is due for CPE/follow up in early November, this will be required prior to any further refills.  Please schedule, thank you!

## 2023-07-31 NOTE — Telephone Encounter (Signed)
Patient has been scheduled

## 2023-08-25 ENCOUNTER — Other Ambulatory Visit: Payer: Self-pay | Admitting: Primary Care

## 2023-08-25 DIAGNOSIS — E1165 Type 2 diabetes mellitus with hyperglycemia: Secondary | ICD-10-CM

## 2023-08-25 NOTE — Telephone Encounter (Signed)
Patient has appointment 11/13.

## 2023-08-30 ENCOUNTER — Encounter: Payer: Self-pay | Admitting: Primary Care

## 2023-08-30 ENCOUNTER — Ambulatory Visit (INDEPENDENT_AMBULATORY_CARE_PROVIDER_SITE_OTHER): Payer: Commercial Managed Care - PPO | Admitting: Primary Care

## 2023-08-30 ENCOUNTER — Telehealth: Payer: Self-pay | Admitting: Cardiovascular Disease

## 2023-08-30 VITALS — BP 144/86 | HR 59 | Temp 97.6°F | Ht 64.0 in | Wt 222.0 lb

## 2023-08-30 DIAGNOSIS — G4733 Obstructive sleep apnea (adult) (pediatric): Secondary | ICD-10-CM

## 2023-08-30 DIAGNOSIS — E785 Hyperlipidemia, unspecified: Secondary | ICD-10-CM

## 2023-08-30 DIAGNOSIS — Z0000 Encounter for general adult medical examination without abnormal findings: Secondary | ICD-10-CM

## 2023-08-30 DIAGNOSIS — I2510 Atherosclerotic heart disease of native coronary artery without angina pectoris: Secondary | ICD-10-CM | POA: Diagnosis not present

## 2023-08-30 DIAGNOSIS — I509 Heart failure, unspecified: Secondary | ICD-10-CM

## 2023-08-30 DIAGNOSIS — E1165 Type 2 diabetes mellitus with hyperglycemia: Secondary | ICD-10-CM

## 2023-08-30 DIAGNOSIS — E0590 Thyrotoxicosis, unspecified without thyrotoxic crisis or storm: Secondary | ICD-10-CM

## 2023-08-30 DIAGNOSIS — Z7984 Long term (current) use of oral hypoglycemic drugs: Secondary | ICD-10-CM

## 2023-08-30 DIAGNOSIS — I77819 Aortic ectasia, unspecified site: Secondary | ICD-10-CM

## 2023-08-30 DIAGNOSIS — I10 Essential (primary) hypertension: Secondary | ICD-10-CM

## 2023-08-30 LAB — LIPID PANEL
Cholesterol: 130 mg/dL (ref 0–200)
HDL: 42.2 mg/dL (ref >39.00)
LDL Cholesterol: 63 mg/dL (ref 0–99)
NonHDL: 87.45
Total CHOL/HDL Ratio: 3
Triglycerides: 123 mg/dL (ref 0.0–149.0)
VLDL: 24.6 mg/dL (ref 0.0–40.0)

## 2023-08-30 LAB — MICROALBUMIN / CREATININE URINE RATIO
Creatinine,U: 135.2 mg/dL
Microalb Creat Ratio: 1.6 mg/g (ref 0.0–30.0)
Microalb, Ur: 2.2 mg/dL — ABNORMAL HIGH (ref 0.0–1.9)

## 2023-08-30 LAB — HEMOGLOBIN A1C: Hgb A1c MFr Bld: 6 % (ref 4.6–6.5)

## 2023-08-30 MED ORDER — SEMAGLUTIDE (2 MG/DOSE) 8 MG/3ML ~~LOC~~ SOPN: 2.0000 mg | PEN_INJECTOR | SUBCUTANEOUS | 1 refills | Status: DC

## 2023-08-30 MED ORDER — SPIRONOLACTONE 25 MG PO TABS: 12.5000 mg | ORAL_TABLET | Freq: Every day | ORAL | 2 refills | Status: DC

## 2023-08-30 MED ORDER — EZETIMIBE 10 MG PO TABS: 10.0000 mg | ORAL_TABLET | Freq: Every day | ORAL | 2 refills | Status: DC

## 2023-08-30 NOTE — Assessment & Plan Note (Addendum)
Asymptomatic.  Following with cardiology, office notes reviewed from August 2024.  Continue diabetes, lipid, and BP control.  Continue Imdur 15 mg daily.

## 2023-08-30 NOTE — Patient Instructions (Addendum)
Call your heart doctor to refill the spironlactone medication for your blood pressure/heart.  Call your heart doctor to refill the ezetimibe (Zetia) cholesterol pills.   Ask the heart doctor if you need to start potassium pills as your potassium level is normal.  Call the pharmacy to refill your empagliflozin (Jardiance) medication for your heart.  We increased the dose of your Ozempic to 2 mg once weekly for weight loss and diabetes.  Stop by the lab prior to leaving today. I will notify you of your results once received.   Please schedule a follow up visit for 6 months for a diabetes check.  It was a pleasure to see you today!

## 2023-08-30 NOTE — Telephone Encounter (Signed)
Potassium may be normal in the setting of KCl usage, along with spironolactone and valsartan. It was normal when last checked in 05/2023. At her convenience I would like for her to come by the office for a BMP, can base recommendations on updated labs.

## 2023-08-30 NOTE — Telephone Encounter (Signed)
Pt c/o medication issue:  1. Name of Medication:   potassium chloride (KLOR-CON) 10 MEQ tablet (Expired)   2. How are you currently taking this medication (dosage and times per day)?   3. Are you having a reaction (difficulty breathing--STAT)?   4. What is your medication issue?   Patient wants to know if she still needs to take this medication as her PCP told her her potassium levels were normal.

## 2023-08-30 NOTE — Assessment & Plan Note (Addendum)
Appears euvolemic today.  She has not been taking Jardiance or spironlactone. Discussed to obtain refills from the pharmacy.   Continue metoprolol succinate 25 mg daily, valsartan 160 mg daily, furosemide 40 mg daily. Discussed to ask cardiology if she needs to resume potassium, recent potassium level within normal range.  Recommended she resume spironolactone 12.5 mg daily, she will call cardiology to confirm. Resume Jardiance 10 mg daily, she will call her pharmacy for refills.  Reviewed cardiology notes from August 2024.

## 2023-08-30 NOTE — Assessment & Plan Note (Signed)
Continue CPAP nightly. °

## 2023-08-30 NOTE — Assessment & Plan Note (Signed)
Following with endocrinology. Reviewed office notes and labs from June 2024.  Continue methimazole 10 mg twice daily.

## 2023-08-30 NOTE — Assessment & Plan Note (Signed)
Immunizations UTD. Pap smear UTD. Mammogram UTD. Colonoscopy UTD, due 2032  Discussed the importance of a healthy diet and regular exercise in order for weight loss, and to reduce the risk of further co-morbidity.  Exam stable. Labs pending.  Follow up in 1 year for repeat physical.

## 2023-08-30 NOTE — Assessment & Plan Note (Addendum)
Repeat lipid panel pending.  Recommended she resume Zetia 10 mg daily. Continue atorvastatin 80 g daily.  Reviewed cardiology notes from August 2024

## 2023-08-30 NOTE — Telephone Encounter (Signed)
*  STAT* If patient is at the pharmacy, call can be transferred to refill team.   1. Which medications need to be refilled? (please list name of each medication and dose if known)   ezetimibe (ZETIA) 10 MG tablet  spironolactone (ALDACTONE) 25 MG tablet   2. Would you like to learn more about the convenience, safety, & potential cost savings by using the [facility]?   3. Are you open to using the [facility] (Type [facility]. ).  4. Which pharmacy/location (including street and city if local pharmacy) is medication to be sent to?  [id number] - [place], [place] - [address]   5. Do they need a 30 day or 90 day supply?  30 day  Patient stated she is completely out of these medications.

## 2023-08-30 NOTE — Telephone Encounter (Signed)
Spoke to patient and informed her of the following:  "Potassium may be normal in the setting of KCl usage, along with spironolactone and valsartan. It was normal when last checked in 05/2023. At her convenience I would like for her to come by the office for a BMP, can base recommendations on updated labs."  Patient stated that she would come by in the next couple of days to have labs drawn

## 2023-08-30 NOTE — Progress Notes (Signed)
Subjective:    Patient ID: Amber [name], female    DOB: 1971/04/06, 52 y.o.   MRN: [id number]  HPI  Amber [name] is a very pleasant 52 y.o. female who presents today for complete physical and follow up of chronic conditions.  Immunizations: -Tetanus: Completed in 2016 -Influenza: completed this season  -Shingles: Completed Shingrix series -Pneumonia: Completed Prevnar 2023 in 2020  Diet: Fair diet.  Exercise: No regular exercise.  Eye exam: Completes annually  Dental exam: Completes semi-annually    Pap Smear: November 2023 Mammogram: January 2024  Colonoscopy: Completed in 2022, due 2032    BP Readings from Last 3 Encounters:  08/30/23 (!) 144/86  05/18/23 130/82  03/20/23 130/80   Wt Readings from Last 3 Encounters:  08/30/23 222 lb (100.7 kg)  05/18/23 228 lb 6.4 oz (103.6 kg)  03/20/23 230 lb (104.3 kg)      Review of Systems  Constitutional:  Negative for unexpected weight change.  HENT:  Negative for rhinorrhea.   Respiratory:  Negative for cough and shortness of breath.   Cardiovascular:  Negative for chest pain.  Gastrointestinal:  Negative for constipation and diarrhea.  Genitourinary:  Negative for difficulty urinating.  Musculoskeletal:  Negative for arthralgias and myalgias.  Skin:  Negative for rash.  Allergic/Immunologic: Negative for environmental allergies.  Neurological:  Negative for dizziness, numbness and headaches.  Psychiatric/Behavioral:  The patient is not nervous/anxious.          Past Medical History:  Diagnosis Date   Acute encephalopathy    Acute pulmonary edema (HCC)    Acute respiratory failure with hypoxia (HCC) 05/20/2019   Anemia    Aortic dilatation (HCC)    37 mm (ascending aorta) 07/2022   CAD (coronary artery disease)    s/p DES to mLAD in 2020, NSTEMI, HFrEF with improvement in EF.   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   Thyroid storm    Unexplained weight loss 03/27/2019   Valvular insufficiency    Mild MR mild TR noted on echocardiogram in October 2023.    Social History   Socioeconomic History   Marital status: Married    Spouse name: Not on file   Number of children: 2   Years of education: Not on file   Highest education level: Not on file  Occupational History    Comment: front desk   Tobacco Use   Smoking status: Never   Smokeless tobacco: Never  Vaping Use   Vaping status: Never Used  Substance and Sexual Activity   Alcohol use: Yes    Alcohol/week: 0.0 standard drinks of alcohol    Comment: rare   Drug use: No   Sexual activity: Not Currently  Other Topics Concern   Not  on file  Social History Narrative   From [place].   Married.   2 children.   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   Social Determinants of Health   Financial Resource Strain: Not on file  Food Insecurity: Not on file  Transportation Needs: Not on file  Physical Activity: Not on file  Stress: Not on file  Social Connections: Not on file  Intimate Partner Violence: Not on file    Past Surgical History:  Procedure Laterality Date   CARDIAC CATHETERIZATION     CESAREAN SECTION     x2   COLONOSCOPY N/A 06/24/2021   Procedure: COLONOSCOPY;   Surgeon: [name], MD;  [facility]: [facility] ENDOSCOPY;  Service: Gastroenterology;  Laterality: N/A;   CORONARY STENT INTERVENTION N/A 05/22/2019   Procedure: CORONARY STENT INTERVENTION;  Surgeon: Iran Ouch, MD;  [facility] LAB;  Service: Cardiovascular;  Laterality: N/A;   MASS EXCISION Right 01/18/2013   Procedure: EXCISION right scapular cyst;  Surgeon: Axel Filler, MD;  Location: [facility];  Service: General;  Laterality: Right;   RIGHT HEART CATH Right 10/19/2022   Procedure: RIGHT HEART CATH;  Surgeon: Yvonne Kendall, MD;  Location: [facility] INVASIVE CV LAB;  Service: Cardiovascular;  Laterality: Right;   RIGHT/LEFT HEART CATH AND CORONARY ANGIOGRAPHY N/A 05/22/2019   Procedure: RIGHT/LEFT HEART CATH AND CORONARY ANGIOGRAPHY;  Surgeon: Iran Ouch, MD;  Location: [facility] LAB;  Service: Cardiovascular;  Laterality: N/A;    Family History  Problem Relation Age of Onset   Diabetes Father    Diabetes Mother    Heart disease Mother    Diabetes Sister    Hypertension Sister     No Known Allergies  Current Outpatient Medications on File Prior to Visit  Medication Sig Dispense Refill   aspirin 81 MG chewable tablet Chew 1 tablet (81 mg total) by mouth daily. 30 tablet 11   atorvastatin (LIPITOR) 80 MG tablet TAKE 1 TABLET BY MOUTH DAILY AT 6 PM. 90 tablet 0   calcium-vitamin D 250-100 MG-UNIT tablet Take 1 tablet by mouth daily.     chlorpheniramine-HYDROcodone (TUSSIONEX) 10-8 MG/5ML Take 5 mLs by mouth every 12 (twelve) hours as needed for cough. 50 mL 0   ferrous sulfate 325 (65 FE) MG tablet Take 325 mg by mouth 3 (three) times a week. No set days     furosemide (LASIX) 40 MG tablet TAKE 1 TABLET BY MOUTH EVERY DAY 30 tablet 3   isosorbide mononitrate (IMDUR) 30 MG 24 hr tablet TAKE 1/2 TABLET BY MOUTH DAILY 45 tablet 2   metFORMIN (GLUCOPHAGE-XR) 500 MG 24 hr tablet TAKE 1 TABLET (500 MG TOTAL) BY MOUTH DAILY WITH BREAKFAST. FOR DIABETES. 90 tablet 0    methimazole (TAPAZOLE) 5 MG tablet Take 2 tablets (10 mg total) by mouth 2 (two) times daily. 360 tablet 2   metoprolol succinate (TOPROL-XL) 25 MG 24 hr tablet TAKE 1 TABLET BY MOUTH EVERY DAY 30 tablet 2   nitroGLYCERIN (NITROSTAT) 0.4 MG SL tablet Place 1 tablet (0.4 mg total) under the tongue every 5 (five) minutes as needed for chest pain. 25 tablet 1   spironolactone (ALDACTONE) 25 MG tablet TAKE 1/2 TABLET BY MOUTH DAILY 15 tablet 3   valsartan (DIOVAN) 160 MG tablet Take 1 tablet (160 mg total) by mouth daily. 90 tablet 3   empagliflozin (JARDIANCE) 10 MG TABS tablet Take 1 tablet (10 mg total)  by mouth daily before breakfast. (Patient not taking: Reported on 08/30/2023) 30 tablet 11   ezetimibe (ZETIA) 10 MG tablet TAKE 1 TABLET BY MOUTH EVERY DAY (Patient not taking: Reported on 08/30/2023) 30 tablet 6   potassium chloride (KLOR-CON) 10 MEQ tablet Take 1 tablet (10 mEq total) by mouth daily. 90 tablet 3   Current Facility-Administered Medications on File Prior to Visit  Medication Dose Route Frequency Provider Last Rate Last Admin   sodium chloride flush (NS) 0.9 % injection 3 mL  3 mL Intravenous Q12H Hammock, Sheri, NP        BP (!) 144/86   Pulse (!) 59   Temp 97.6 F (36.4 C) (Temporal)   Ht 5\' 4"  (1.626 m)   Wt 222 lb (100.7 kg)   LMP 05/19/2021 (Approximate)   SpO2 97%   BMI 38.11 kg/m  Objective:   Physical Exam HENT:     Right Ear: Tympanic membrane and ear canal normal.     Left Ear: Tympanic membrane and ear canal normal.  Eyes:     Pupils: Pupils are equal, round, and reactive to light.  Cardiovascular:     Rate and Rhythm: Normal rate and regular rhythm.  Pulmonary:     Effort: Pulmonary effort is normal.     Breath sounds: Normal breath sounds.  Abdominal:     General: Bowel sounds are normal.     Palpations: Abdomen is soft.     Tenderness: There is no abdominal tenderness.  Musculoskeletal:        General: Normal range of motion.     Cervical back:  Neck supple.  Skin:    General: Skin is warm and dry.  Neurological:     Mental Status: She is alert and oriented to person, place, and time.     Cranial Nerves: No cranial nerve deficit.     Deep Tendon Reflexes:     Reflex Scores:      Patellar reflexes are 2+ on the right side and 2+ on the left side. Psychiatric:        Mood and Affect: Mood normal.           Assessment & Plan:  Preventative health care Assessment & Plan: Immunizations UTD. Pap smear UTD. Mammogram UTD Colonoscopy UTD, due 2032  Discussed the importance of a healthy diet and regular exercise in order for weight loss, and to reduce the risk of further co-morbidity.  Exam stable. Labs pending.  Follow up in 1 year for repeat physical.    Coronary artery disease involving native coronary artery of native heart without angina pectoris Assessment & Plan: Asymptomatic.  Following with cardiology, office notes reviewed from August 2024.  Continue diabetes, lipid, and BP control.  Continue Imdur 15 mg daily.   Type 2 diabetes mellitus with hyperglycemia, without long-term current use of insulin (HCC) Assessment & Plan: Repeat A1c pending.  Discussed to resume Jardiance for cardiac causes.  Reviewed cardiology notes and they recommend as well. Does not need Jardiance for diabetes purposes.  Continue metformin XR 500 mg daily. Increase Ozempic to 2 mg weekly for weight loss benefits.  Urine microalbumin due and pending.  Follow-up in 6 months.  Orders: -     Semaglutide (2 MG/DOSE); Inject 2 mg as directed once a week. for diabetes.  Dispense: 9 mL; Refill: 1 -     Hemoglobin A1c -     Microalbumin / creatinine urine ratio  Congestive heart failure, unspecified HF chronicity, unspecified heart  failure type [facility]) Assessment & Plan: Appears euvolemic today.  She has not been taking Jardiance or spironlactone. Discussed to obtain refills from the pharmacy.   Continue metoprolol succinate  25 mg daily, valsartan 160 mg daily, furosemide 40 mg daily. Discussed to ask cardiology if she needs to resume potassium, recent potassium level within normal range.  Recommended she resume spironolactone 12.5 mg daily, she will call cardiology to confirm. Resume Jardiance 10 mg daily, she will call her pharmacy for refills.  Reviewed cardiology notes from August 2024.   Hyperlipidemia, unspecified hyperlipidemia type Assessment & Plan: Repeat lipid panel pending.  Recommended she resume Zetia 10 mg daily. Continue atorvastatin 80 g daily.  Reviewed cardiology notes from August 2024  Orders: -     Lipid panel  Essential hypertension Assessment & Plan: Above goal today.   Recommended she refill spironolactone at 12.5 mg daily.  She will contact cardiology to confirm. Continue valsartan 160 mg daily. Continue metoprolol succinate 25 mg daily.   OSA (obstructive sleep apnea) Assessment & Plan: Continue CPAP nightly.   Hyperthyroidism Assessment & Plan: Following with endocrinology. Reviewed office notes and labs from June 2024.  Continue methimazole 10 mg twice daily.         Doreene Nest, NP

## 2023-08-30 NOTE — Assessment & Plan Note (Signed)
Above goal today.   Recommended she refill spironolactone at 12.5 mg daily.  She will contact cardiology to confirm. Continue valsartan 160 mg daily. Continue metoprolol succinate 25 mg daily.

## 2023-08-30 NOTE — Assessment & Plan Note (Addendum)
Repeat A1c pending.  Discussed to resume Jardiance for cardiac causes.  Reviewed cardiology notes and they recommend as well. Does not need Jardiance for diabetes purposes.  Continue metformin XR 500 mg daily. Increase Ozempic to 2 mg weekly for weight loss benefits.  Urine microalbumin due and pending.  Follow-up in 6 months.

## 2023-09-25 ENCOUNTER — Encounter: Payer: Self-pay | Admitting: Internal Medicine

## 2023-09-25 ENCOUNTER — Ambulatory Visit: Payer: Commercial Managed Care - PPO | Admitting: Internal Medicine

## 2023-09-25 VITALS — BP 124/72 | HR 67 | Ht 64.0 in | Wt 221.0 lb

## 2023-09-25 DIAGNOSIS — E0590 Thyrotoxicosis, unspecified without thyrotoxic crisis or storm: Secondary | ICD-10-CM

## 2023-09-25 DIAGNOSIS — E0500 Thyrotoxicosis with diffuse goiter without thyrotoxic crisis or storm: Secondary | ICD-10-CM | POA: Diagnosis not present

## 2023-09-25 NOTE — Progress Notes (Unsigned)
Name: Amber [name]  MRN/ DOB: [id number], 1970/12/21    Age/ Sex: 52 y.o., female     PCP: Doreene Nest, NP   Reason for Endocrinology Evaluation: Hyperthytroidism     Initial Endocrinology Clinic Visit: 04/03/2019    PATIENT IDENTIFIER: Amber [name] is a 52 y.o., female with a past medical history of HTN and CAD (S/P PCI 05/2019).She has followed with [facility] since 04/03/2019 for consultative assistance with management of her Hyperthyrodism  HISTORICAL SUMMARY: The patient was first diagnosed with hyperthyroidism in 03/2019, with a suppressed TSH at < 0.01 uIU/mL with elevated FT4 5.96 ng/dL. She did have weight loss  For ~ 4 months prior to her presentation.  Methimazole started in 03/2019  SUBJECTIVE:     Today (09/25/2023):  [name] is here for a follow up on hyperthyroidism and Graves' disease.  She continues to follow-up with cardiology  She has been noted with weight loss, patient on Ozempic Denies palpitations  Denies constipation or diarrhea  No burning or itching in the eyes, she is concerned about right eye proptosis, which is mild, has a pending follow-up with ophthalmology next month Denies  local neck swelling  Denies tremors     Methimazole 5 mg, 2 tabs  BID    HISTORY:  Past Medical History:  Past Medical History:  Diagnosis Date   Acute encephalopathy    Acute pulmonary edema (HCC)    Acute respiratory failure with hypoxia (HCC) 05/20/2019   Anemia    Aortic dilatation (HCC)    37 mm (ascending aorta) 07/2022   CAD (coronary artery disease)    s/p DES to mLAD in 2020, NSTEMI, HFrEF with improvement in EF.   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   Thyroid storm    Unexplained weight loss 03/27/2019   Valvular insufficiency    Mild MR mild TR noted on echocardiogram in October 2023.   Past Surgical History:  Past Surgical History:  Procedure Laterality Date   CARDIAC CATHETERIZATION     CESAREAN SECTION     x2   COLONOSCOPY N/A 06/24/2021   Procedure: COLONOSCOPY;  Surgeon: [name], MD;  Location: [facility] ENDOSCOPY;  Service: Gastroenterology;  Laterality: N/A;   CORONARY STENT INTERVENTION N/A 05/22/2019   Procedure: CORONARY STENT INTERVENTION;  Surgeon: Iran Ouch, MD;  Location: [facility]VASIVE CV LAB;  Service: Cardiovascular;  Laterality: N/A;   MASS EXCISION Right 01/18/2013   Procedure: EXCISION right scapular cyst;  Surgeon: Axel Filler, MD;  Location: [facility] ORS;  Service: General;  Laterality: Right;   RIGHT HEART CATH Right 10/19/2022   Procedure: RIGHT HEART CATH;  Surgeon: Yvonne Kendall, MD;  Location: [facility] INVASIVE CV LAB;  Service: Cardiovascular;  Laterality: Right;  RIGHT/LEFT HEART CATH AND CORONARY ANGIOGRAPHY N/A 05/22/2019   Procedure: RIGHT/LEFT HEART CATH AND CORONARY ANGIOGRAPHY;  Surgeon: Iran Ouch, MD;  Location: [facility]VASIVE CV LAB;  Service: Cardiovascular;  Laterality: N/A;   Social History:  reports that she has never smoked. She has never used smokeless tobacco. She reports current alcohol use. She reports that she does not use drugs. Family History:  Family History  Problem Relation Age of Onset   Diabetes Father    Diabetes Mother    Heart disease Mother    Diabetes  Sister    Hypertension Sister      HOME MEDICATIONS: Allergies as of 09/25/2023   No Known Allergies      Medication List        Accurate as of September 25, 2023 10:58 AM. If you have any questions, ask your nurse or doctor.          aspirin 81 MG chewable tablet Chew 1 tablet (81 mg total) by mouth daily.   atorvastatin 80 MG tablet Commonly known as: LIPITOR TAKE 1 TABLET BY MOUTH DAILY AT 6 PM.   calcium-vitamin D 250-100 MG-UNIT tablet Take 1 tablet by mouth daily.   chlorpheniramine-HYDROcodone 10-8 MG/5ML Commonly known as: TUSSIONEX Take 5 mLs by mouth every 12 (twelve) hours as needed for cough.   empagliflozin 10 MG Tabs tablet Commonly known as: Jardiance Take 1 tablet (10 mg total) by mouth daily before breakfast.   ezetimibe 10 MG tablet Commonly known as: ZETIA Take 1 tablet (10 mg total) by mouth daily.   ferrous sulfate 325 (65 FE) MG tablet Take 325 mg by mouth 3 (three) times a week. No set days   furosemide 40 MG tablet Commonly known as: LASIX TAKE 1 TABLET BY MOUTH EVERY DAY   isosorbide mononitrate 30 MG 24 hr tablet Commonly known as: IMDUR TAKE 1/2 TABLET BY MOUTH DAILY   metFORMIN 500 MG 24 hr tablet Commonly known as: GLUCOPHAGE-XR TAKE 1 TABLET (500 MG TOTAL) BY MOUTH DAILY WITH BREAKFAST. FOR DIABETES.   methimazole 5 MG tablet Commonly known as: TAPAZOLE Take 2 tablets (10 mg total) by mouth 2 (two) times daily.   metoprolol succinate 25 MG 24 hr tablet Commonly known as: TOPROL-XL TAKE 1 TABLET BY MOUTH EVERY DAY   nitroGLYCERIN 0.4 MG SL tablet Commonly known as: Nitrostat Place 1 tablet (0.4 mg total) under the tongue every 5 (five) minutes as needed for chest pain.   potassium chloride 10 MEQ tablet Commonly known as: KLOR-CON Take 1 tablet (10 mEq total) by mouth daily.   Semaglutide (2 MG/DOSE) 8 MG/3ML Sopn Inject 2 mg as directed once a week. for diabetes.   spironolactone 25 MG tablet Commonly known as:  ALDACTONE Take 0.5 tablets (12.5 mg total) by mouth daily.   valsartan 160 MG tablet Commonly known as: DIOVAN Take 1 tablet (160 mg total) by mouth daily.          OBJECTIVE:   PHYSICAL EXAM: VS: BP 124/72 (BP Location: Left Arm, Patient Position: Sitting, Cuff Size: Large)   Pulse 67   Ht 5\' 4"  (1.626 m)   Wt 221 lb (100.2 kg)   LMP 05/19/2021 (Approximate)   SpO2 98%   BMI 37.93 kg/m    EXAM: General: Pt appears well and is in NAD  Neck: General: Supple without adenopathy. Thyroid: Thyroid size is prominent.  Lungs: Clear with good BS bilat   Heart: Auscultation: RRR.  Extremities:  BL LE: No pretibial  edema  Mental Status: Mood and affect: No depression, anxiety, or agitation     DATA REVIEWED: ****  Thyroid ultrasound 01/06/2022  Estimated total number of nodules >/= 1 cm: 0   Number of spongiform nodules >/=  2 cm not described below (TR1): 0   Number of mixed cystic and solid nodules >/= 1.5 cm not described below (TR2): 0   _________________________________________________________   Marked thyroid heterogeneity and mild diffuse enlargement. Background pseudo nodularity noted. No hypervascularity. No discrete nodule or focal abnormality. No regional adenopathy.   IMPRESSION: Heterogeneous mildly enlarged thyroid compatible with chronic medical thyroid disease.   Negative for nodule.    ASSESSMENT / PLAN / RECOMMENDATIONS:   Hyperthyroidism Secondary to Graves' Disease:  -Patient is clinically euthyroid -Today we discussed alternative treatment options to include RAI ablation versus surgery versus continue methimazole -If alternative treatment is needed, I do recommend total thyroidectomy, due to the risk of Graves' orbitopathy with RAI, as the patient already has mild right eye proptosis -***   Medications  Continue methimazole 5 mg,2  tabs BID   2. Graves' Disease:   -Patient with minimal right eye stare   Follow-up in 4  months    Signed electronically by: Lyndle Herrlich, MD  [facility] Endocrinology  [facility] [address] [place], [place] [address] Phone: [phone] FAX: [phone]      CC: Doreene Nest, NP [address] [place] [address] Phone: [phone]  Fax: [phone]   Return to Endocrinology clinic as below: Future Appointments  Date Time Provider Department Center  09/25/2023 11:10 AM Anberlin Diez, Konrad Dolores, MD [facility] None  03/06/2024 11:00 AM Doreene Nest, NP [facility]

## 2023-09-26 LAB — TSH: TSH: 3.07 m[IU]/L

## 2023-09-26 LAB — T4, FREE: Free T4: 0.9 ng/dL (ref 0.8–1.8)

## 2023-09-26 LAB — T3, FREE: T3, Free: 3.2 pg/mL (ref 2.3–4.2)

## 2023-09-27 ENCOUNTER — Encounter: Payer: Self-pay | Admitting: Internal Medicine

## 2023-09-27 MED ORDER — METHIMAZOLE 5 MG PO TABS: 10.0000 mg | ORAL_TABLET | Freq: Two times a day (BID) | ORAL | 2 refills | Status: DC

## 2023-10-24 ENCOUNTER — Other Ambulatory Visit: Payer: Self-pay | Admitting: Physician Assistant

## 2023-10-24 NOTE — Telephone Encounter (Signed)
 Last office visit: 05/18/23 with plan to f/u in 6 months   Next visit: none/active recall   08/30/23 phone note requesting patient to come for lab to assess for need of K+.

## 2023-11-20 ENCOUNTER — Other Ambulatory Visit: Payer: Self-pay | Admitting: Internal Medicine

## 2023-11-20 NOTE — Telephone Encounter (Signed)
 Left voicemail for return call

## 2023-11-20 NOTE — Telephone Encounter (Signed)
Good Morning,  Could you schedule this patient a 6 month follow up visit? The patient was last seen by R. Dunn on 05-18-2023. Thank you so much.

## 2023-11-21 ENCOUNTER — Other Ambulatory Visit: Payer: Self-pay | Admitting: Physician Assistant

## 2023-11-22 NOTE — Telephone Encounter (Signed)
 Left voicemail for return call

## 2023-11-23 ENCOUNTER — Other Ambulatory Visit: Payer: Self-pay | Admitting: Primary Care

## 2023-11-23 DIAGNOSIS — E1165 Type 2 diabetes mellitus with hyperglycemia: Secondary | ICD-10-CM

## 2023-11-24 NOTE — Telephone Encounter (Signed)
 Appointment scheduled for 12/11/23.

## 2023-11-27 ENCOUNTER — Other Ambulatory Visit: Payer: Self-pay | Admitting: Cardiovascular Disease

## 2023-11-29 ENCOUNTER — Other Ambulatory Visit: Payer: Self-pay | Admitting: Cardiovascular Disease

## 2023-11-30 ENCOUNTER — Other Ambulatory Visit: Payer: Self-pay | Admitting: Cardiovascular Disease

## 2023-12-11 ENCOUNTER — Ambulatory Visit: Payer: Commercial Managed Care - PPO | Admitting: Physician Assistant

## 2023-12-14 ENCOUNTER — Other Ambulatory Visit: Payer: Self-pay | Admitting: Cardiovascular Disease

## 2023-12-14 NOTE — Telephone Encounter (Signed)
 Please advise for 90 day refill pt pending BMET lab draw.

## 2023-12-15 ENCOUNTER — Other Ambulatory Visit: Payer: Self-pay | Admitting: Emergency Medicine

## 2023-12-15 NOTE — Telephone Encounter (Signed)
 Left a message for the patient to call back.  Per Eula Listen, PA: Labs were to be drawn back in 08/2023. She never had those done. I do not recommend refilling KCl until previously requested labs are completed, reviewed, and deemed to be acceptable for refill.

## 2023-12-19 ENCOUNTER — Other Ambulatory Visit: Payer: Self-pay | Admitting: Emergency Medicine

## 2023-12-19 DIAGNOSIS — Z79899 Other long term (current) drug therapy: Secondary | ICD-10-CM

## 2023-12-19 NOTE — Addendum Note (Signed)
 Addended by: Ursula Alert on: 12/19/2023 02:27 PM   Modules accepted: Orders

## 2023-12-19 NOTE — Telephone Encounter (Addendum)
 Amber [name],   She just had lipids in November, but hasn't had LFTs since 09/2022.  Ok to change pending BMET to CMET.  She is due for a CBC (last 10/2022) - I suspect Amber [name] will want one.  Chris _____  Londell Moh,   Pt agreed to come in for lab work for her potassium refill request. However I'd like to anticipate any other lab orders for her upcoming appt 4/8 with Amber [name].    Should I make the BMeT order into a CMeT to catch liver function, as well as a fasting lipid?  Would you add anything else?   Thanks!  ___________

## 2023-12-19 NOTE — Progress Notes (Signed)
 Pt called to come in for labs - CB authorized additional lab collection to the BMeT originally schedule from 11/13   Previous BMeT from 11/13 addendum cancelled

## 2023-12-21 ENCOUNTER — Other Ambulatory Visit: Payer: Self-pay | Admitting: Internal Medicine

## 2023-12-21 ENCOUNTER — Other Ambulatory Visit: Payer: Self-pay | Admitting: Cardiovascular Disease

## 2024-01-22 NOTE — Progress Notes (Deleted)
 Cardiology Office Note    Date:  01/22/2024   ID:  Amber [name], DOB Mar 20, 1971, MRN [id number]  PCP:  Doreene Nest, NP  Cardiologist:  Lorine Bears, MD  Electrophysiologist:  None   Chief Complaint: ***  History of Present Illness:   Amber [name] is a 53 y.o. female with history of ***  ***   Labs independently reviewed: 09/2023 - TSH normal 08/2023 - A1c 6.0, TC 130, TG 123, HDL 42, LDL 63 05/2023 - BUN 13, serum creatinine 0.63, potassium 4.2 09/2022 - Hgb 13.4, PLT 301  Past Medical History:  Diagnosis Date   Acute encephalopathy    Acute pulmonary edema (HCC)    Acute respiratory failure with hypoxia (HCC) 05/20/2019   Anemia    Aortic dilatation (HCC)    37 mm (ascending aorta) 07/2022   CAD (coronary artery disease)    s/p DES to mLAD in 2020, NSTEMI, HFrEF with improvement in EF.   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   Thyroid storm    Unexplained weight loss 03/27/2019   Valvular insufficiency    Mild MR mild TR noted on echocardiogram in October 2023.    Past Surgical History:  Procedure Laterality Date   CARDIAC CATHETERIZATION     CESAREAN SECTION     x2   COLONOSCOPY N/A 06/24/2021   Procedure: COLONOSCOPY;  Surgeon: [name], MD;  Location: [facility]  ENDOSCOPY;  Service: Gastroenterology;  Laterality: N/A;   CORONARY STENT INTERVENTION N/A 05/22/2019   Procedure: CORONARY STENT INTERVENTION;  Surgeon: Iran Ouch, MD;  Location: [facility] INVASIVE CV LAB;  Service: Cardiovascular;  Laterality: N/A;   MASS EXCISION Right 01/18/2013   Procedure: EXCISION right scapular cyst;  Surgeon: Axel Filler, MD;  Location: [facility] ORS;  Service: General;  Laterality: Right;   RIGHT HEART CATH Right 10/19/2022   Procedure: RIGHT HEART CATH;  Surgeon: Yvonne Kendall, MD;  Location: [facility] INVASIVE CV LAB;  Service: Cardiovascular;  Laterality: Right;   RIGHT/LEFT HEART CATH AND CORONARY ANGIOGRAPHY N/A 05/22/2019   Procedure: RIGHT/LEFT HEART CATH AND CORONARY ANGIOGRAPHY;  Surgeon: Iran Ouch, MD;  Location: [facility] INVASIVE CV LAB;  Service: Cardiovascular;  Laterality: N/A;    Current Medications: No outpatient medications have been marked as taking for the 01/23/24 encounter (Appointment) with Sondra Barges, PA-C.    Allergies:   Patient has no known allergies.   Social History   Socioeconomic History   Marital status: Married    Spouse name: Not on file   Number of children: 2   Years of education: Not on file   Highest education level: Not on file  Occupational History    Comment: front desk   Tobacco Use   Smoking status: Never  Smokeless tobacco: Never  Vaping Use   Vaping status: Never Used  Substance and Sexual Activity   Alcohol use: Yes    Alcohol/week: 0.0 standard drinks of alcohol    Comment: rare   Drug use: No   Sexual activity: Not Currently  Other Topics Concern   Not on file  Social History Narrative   From [place].   Married.   2 children.   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   Social Drivers of Corporate investment banker Strain: Not on file  Food Insecurity: Not on file  Transportation Needs: Not on file  Physical Activity: Not on file  Stress: Not  on file  Social Connections: Not on file     Family History:  The patient's family history includes Diabetes in her father, mother, and sister; Heart disease in her mother; Hypertension in her sister.  ROS:   12-point review of systems is negative unless otherwise noted in the HPI.   EKGs/Labs/Other Studies Reviewed:    Studies reviewed were summarized above. The additional studies were reviewed today:  [facility] 10/19/2022: Conclusions: Moderately elevated left heart and pulmonary artery pressures. Moderately to severely elevated right heart filling pressures. Normal cardiac output/index.   Recommendations: Escalate diuresis; furosemide increased to 40 mg daily. Optimize goal-directed medical therapy for chronic heart failure with recovered ejection fraction __________   2D echo 08/09/2022: 1. Left ventricular ejection fraction, by estimation, is 55 to 60%. The  left ventricle has normal function. The left ventricle has no regional  wall motion abnormalities. There is mild left ventricular hypertrophy.  Left ventricular diastolic parameters  were normal.   2. Right ventricular systolic function is normal. The right ventricular  size is normal. There is moderately elevated pulmonary artery systolic  pressure. The estimated right ventricular systolic pressure is 54.0 mmHg.   3. The mitral valve is normal in structure. Mild mitral valve  regurgitation. No evidence of mitral stenosis.   4. The aortic valve has an indeterminant number of cusps. Aortic valve  regurgitation is not visualized. No aortic stenosis is present.   5. There is borderline dilatation of the ascending aorta, measuring 37  mm.   6. The inferior vena cava is normal in size with greater than 50%  respiratory variability, suggesting right atrial pressure of 3 mmHg.  __________   [facility]ugenie Birks MPI 02/19/2020: There was no ST segment deviation noted during stress. There are severe perfusion defects of mild to moderate size  present in the mid to apical anteroseptal wall, basal to mid lateral wall, and the true apex. Findings consistent with prior myocardial infarction. The left ventricular ejection fraction is normal (54%). This is a low to intermediate risk study. There was no evidence for ischemia __________   2D echo 02/12/2020: 1. Left ventricular ejection fraction, by estimation, is 50 to 55%. The  left ventricle has low normal function. The left ventricle has no regional  wall motion abnormalities. Indeterminate diastolic filling due to E-A  fusion.   2. Right ventricular systolic function is normal. The right ventricular  size is normal.   3. Left atrial size was mildly dilated.   4. The mitral valve is normal in structure. No evidence of mitral valve  regurgitation.   5. The aortic valve is grossly normal. Aortic valve regurgitation is not  visualized.  __________   2D echo 08/08/2019: 1. Left ventricular ejection fraction, by visual estimation, is 40 to  45%. The left ventricle has severely decreased function. Mildly increased  left ventricular size. There is no left ventricular hypertrophy.   2. Left ventricular diastolic Doppler parameters are consistent with  pseudonormalization pattern of LV diastolic filling.   3. Global right ventricle has normal systolic function.The right  ventricular size is normal. No increase in right ventricular wall  thickness.   4. Left atrial size was mildly dilated.   5. Mildly elevated pulmonary artery systolic pressure.  __________   [facility] 05/22/2019: 1st Mrg lesion is 100% stenosed. 2nd Mrg lesion is 100% stenosed. Mid LAD lesion is 80% stenosed. Post intervention, there is a 0% residual stenosis. A drug-eluting stent was successfully placed using a STENT RESOLUTE ONYX 3.0X18. 2nd Diag lesion is 30% stenosed. Prox RCA to Mid RCA lesion is 50% stenosed. RPAV lesion is 40% stenosed. RPDA lesion is 30% stenosed.   1.  Significant two-vessel coronary  artery disease with 80% mid LAD stenosis, occluded OM branches and moderate RCA disease. 2.  Left ventricular angiography was not performed.  EF was severely reduced by echo. 3.  Right heart catheterization showed high normal filling pressures with mean RA pressure of 8 mmHg, pulmonary capillary wedge pressure of 12 to 13 mmHg, mild pulmonary hypertension and high cardiac output at 8 L/min. 4.  Successful angioplasty and drug-eluting stent placement to the mid LAD.   Recommendations: Continue dual antiplatelet therapy for at least 1 year. The patient needs aggressive medical therapy for the rest of her coronary artery disease.  I added high-dose atorvastatin. I added oral metoprolol to control tachycardia. I discontinued IV furosemide for now and she might require resumption of this either intravenously or orally as needed. The patient was alert throughout the procedure .  Her hemodynamics are good enough to allow extubation if no other issues. __________   2D echo 05/21/2019: 1. The left ventricle has severely reduced systolic function, with an  ejection fraction of 25-30%. The cavity size was moderately dilated. Left  ventricular diastolic parameters were normal.   2. 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   3. The right ventricle has normal systolic function. The cavity was  normal. There is no increase in right ventricular wall thickness.   4. The aortic valve is tricuspid. Mild thickening of the aortic valve.   5. The aorta is normal in size and structure.    EKG:  EKG is ordered today.  The EKG ordered today demonstrates ***  Recent Labs: 05/18/2023: BUN 13; Creatinine, Ser 0.63; Potassium 4.2; Sodium 141 09/25/2023: TSH 3.07  Recent Lipid Panel    Component Value Date/Time   CHOL 130 08/30/2023 1030   CHOL 176 01/05/2021 1504   TRIG 123.0 08/30/2023 1030   HDL 42.20 08/30/2023 1030   HDL 49 01/05/2021 1504   CHOLHDL 3 08/30/2023 1030   VLDL 24.6  08/30/2023 1030   LDLCALC 63 08/30/2023 1030   LDLCALC 98 01/05/2021 1504    PHYSICAL EXAM:    VS:  LMP 05/19/2021 (Approximate)   BMI: There is no height or weight on file to calculate BMI.  Physical Exam  Wt Readings from Last 3 Encounters:  09/25/23 221 lb (100.2 kg)  08/30/23 222 lb (100.7 kg)  05/18/23 228 lb 6.4 oz (103.6 kg)     ASSESSMENT & PLAN:   ***   {Are you ordering a CV Procedure (e.g. stress test, cath, DCCV, TEE, etc)?   Press F2        :[id number]}  Disposition: F/u with Dr. Kirke Corin or an APP in ***.   Medication Adjustments/Labs and Tests Ordered: Current medicines are reviewed at length with the patient today.  Concerns regarding medicines are outlined above. Medication changes, Labs and Tests ordered today are summarized above and listed in the Patient Instructions accessible in Encounters.   Signed, Eula Listen, PA-C 01/22/2024 9:10 AM     [facility] [place] [address] [place], [place] [address] [phone]

## 2024-01-23 ENCOUNTER — Ambulatory Visit: Payer: Commercial Managed Care - PPO | Admitting: Physician Assistant

## 2024-01-24 ENCOUNTER — Ambulatory Visit: Payer: Commercial Managed Care - PPO | Admitting: Internal Medicine

## 2024-02-09 NOTE — Progress Notes (Unsigned)
 Cardiology Office Note    Date:  02/12/2024   ID:  Antaniya Venuti, DOB Dec 21, 1970, MRN [id number]  PCP:  Gabriel John, NP  Cardiologist:  Antionette Kirks, MD  Electrophysiologist:  None   Chief Complaint: Follow-up  History of Present Illness:   Amber [name] is a 53 y.o. 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   She was diagnosed with severe hyperthyroidism in 05/2019 and admitted with acute respiratory failure requiring mechanical ventilation.  Admission was complicated by NSTEMI and pulmonary edema.  Echo showed an EF of 25 to 30% with wall motion abnormalities suggestive of Takotsubo cardiomyopathy versus anterior infarct.  She underwent R/LHC and was found to have two-vessel CAD with 80% stenosis in the mid LAD, occluded OM branches, and moderate RCA disease.  PCI and DES placement was done to the mid LAD.  Heart failure medications were optimized and hypothyroidism was treated.  Echo in 07/2019 showed an EF of 40 to 45%.  She was admitted in 01/2020 with atypical chest pain and ruled out for MI.  Outpatient Lexiscan  MPI showed evidence of prior anterior infarct without ischemia.  Echo showed an EF of 50 to 55% with no significant valvular abnormalities.   Echo in 07/2022 demonstrated an EF of 55 to 60%, no regional wall motion abnormalities, mild LVH, normal LV diastolic function parameters, normal RV systolic function and ventricular cavity size, moderately elevated PASP estimated at 54 mmHg, mild mitral regurgitation, borderline dilatation of the ascending aorta measuring 37 mm, and an estimated  right atrial pressure of 3 mmHg.   She has also been followed by pulmonology with VQ scan showing normal perfusion.  Resolution chest CT showed no findings suggestive of ILD with mild air trapping indicative of small airway disease, cardiomegaly, and coronary artery calcification with aortic atherosclerosis.     She was seen in the office on 10/13/2022 with continued shortness of breath and exertional dyspnea.  She underwent RHC on 10/19/2022 showed moderately elevated left heart and pulmonary artery pressures with moderately to severely elevated right heart filling pressures and normal cardiac output/index.  Recommendation was to escalate diuresis and optimize GDMT.   PFTs consistent with severe restrictive lung disease.  Sleep study positive for sleep apnea.   She was seen on 11/01/2022 with unchanged dyspnea despite escalation of diuretic.  She was started on Jardiance  and referred to the advanced heart failure clinic.  She was not a candidate for selective pulmonary artery vasodilators based on hemodynamics.  She was advised to follow-up with pulmonology for CPAP and PCP for consideration of GLP1 agonist.  She was seen in the office on 03/07/2023 and noted improvement in her breathing.  She was getting used to CPAP.  Her weight was down 14 pounds when compared to her visit in 11/2022, which was intentional through lifestyle modification and initiation of semaglutide .  She was placed on valsartan  in place of losartan  for further management of hypertension.  She was last seen in the office in 05/2023 and continue to note further improvement in breathing.  She was adherent to CPAP.  No changes in pharmacotherapy were pursued at that time.  She  comes in today continuing to do very well from a cardiac perspective and is without symptoms of angina or cardiac decompensation.  Breathing continues to improve.  Her weight is down another 12 pounds today when compared to her visit in 05/2023, she indicates this is  intentional.  She is adherent to CPAP.  No dizziness, presyncope, or syncope.  Adherent and tolerating cardiac medications without off target effect.   Labs independently reviewed: 09/2023 - TSH normal 08/2023 - A1c 6.0, TC 130, TG 123, HDL 42, LDL 63 05/2023 - BUN 13, serum creatinine 0.63, potassium 4.2 09/2022 - Hgb 13.4, PLT 301  Past Medical History:  Diagnosis Date   Acute encephalopathy    Acute pulmonary edema (HCC)    Acute respiratory failure with hypoxia (HCC) 05/20/2019   Anemia    Aortic dilatation (HCC)    37 mm (ascending aorta) 07/2022   CAD (coronary artery disease)    s/p DES to mLAD in 2020, NSTEMI, HFrEF with improvement in EF.   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   Thyroid  storm    Unexplained weight loss 03/27/2019   Valvular insufficiency    Mild MR mild TR noted on echocardiogram in October 2023.    Past Surgical History:  Procedure Laterality Date   CARDIAC CATHETERIZATION     CESAREAN SECTION     x2   COLONOSCOPY N/A 06/24/2021   Procedure: COLONOSCOPY;  Surgeon: Luke Salaam, MD;  Location: [facility] ENDOSCOPY;  Service: Gastroenterology;  Laterality: N/A;   CORONARY STENT INTERVENTION N/A 05/22/2019   Procedure: CORONARY STENT INTERVENTION;  Surgeon: Wenona Hamilton,  MD;  Location: [facility] INVASIVE CV LAB;  Service: Cardiovascular;  Laterality: N/A;   MASS EXCISION Right 01/18/2013   Procedure: EXCISION right scapular cyst;  Surgeon: Shela Derby, MD;  Location: [facility] ORS;  Service: General;  Laterality: Right;   RIGHT HEART CATH Right 10/19/2022   Procedure: RIGHT HEART CATH;  Surgeon: Sammy Crisp, MD;  Location: [facility] INVASIVE CV LAB;  Service: Cardiovascular;  Laterality: Right;   RIGHT/LEFT HEART CATH AND CORONARY ANGIOGRAPHY N/A 05/22/2019   Procedure: RIGHT/LEFT HEART CATH AND CORONARY ANGIOGRAPHY;  Surgeon: Wenona Hamilton, MD;  Location: [facility] INVASIVE CV LAB;  Service: Cardiovascular;  Laterality: N/A;    Current Medications: Current Meds  Medication Sig   aspirin  81 MG chewable tablet Chew 1 tablet (81 mg total) by mouth daily.   atorvastatin  (LIPITOR ) 80 MG tablet TAKE 1 TABLET BY MOUTH DAILY AT 6 PM.   calcium -vitamin D  250-100 MG-UNIT tablet Take 1 tablet by mouth daily.   chlorpheniramine-HYDROcodone (TUSSIONEX) 10-8 MG/5ML Take 5 mLs by mouth every 12 (twelve) hours as needed for cough.   empagliflozin  (JARDIANCE ) 10 MG TABS tablet TAKE 1 TABLET BY MOUTH  DAILY BEFORE BREAKFAST.   ezetimibe  (ZETIA ) 10 MG tablet TAKE 1 TABLET BY MOUTH EVERY DAY   ferrous sulfate  325 (65 FE) MG tablet Take 325 mg by mouth 3 (three) times a week. No set days   furosemide  (LASIX ) 40 MG tablet Take 1 tablet (40 mg total) by mouth daily.   metFORMIN  (GLUCOPHAGE -XR) 500 MG 24 hr tablet TAKE 1 TABLET (500 MG TOTAL) BY MOUTH DAILY WITH BREAKFAST. FOR DIABETES.   methimazole  (TAPAZOLE ) 5 MG tablet Take 2 tablets (10 mg total) by mouth 2 (two) times daily.   metoprolol  succinate (TOPROL -XL) 25 MG 24 hr tablet TAKE 1 TABLET BY MOUTH EVERY DAY   nitroGLYCERIN  (NITROSTAT ) 0.4 MG SL tablet Place 1 tablet (0.4 mg total) under the tongue every 5 (five) minutes as needed for chest pain.   potassium chloride  (KLOR-CON ) 10 MEQ tablet TAKE 1 TABLET (10 MEQ TOTAL) BY MOUTH DAILY. PLEASE  COME TO OFFICE FOR LABS.   Semaglutide , 2 MG/DOSE, 8 MG/3ML SOPN Inject 2 mg as directed once a week. for diabetes.   spironolactone  (ALDACTONE ) 25 MG tablet TAKE 1/2 TABLET BY MOUTH EVERY DAY   valsartan  (DIOVAN ) 160 MG tablet Take 1 tablet (160 mg total) by mouth daily.   [DISCONTINUED] isosorbide  mononitrate (IMDUR ) 30 MG 24 hr tablet TAKE 1/2 TABLET BY MOUTH DAILY    Allergies:   Patient has no known allergies.   Social History   Socioeconomic History   Marital status: Married    Spouse name: Not on file   Number of children: 2   Years of education: Not on file   Highest education level: Not on file  Occupational History    Comment: front desk   Tobacco Use   Smoking status: Never   Smokeless tobacco: Never  Vaping Use   Vaping status: Never Used  Substance and Sexual Activity   Alcohol use: Yes    Alcohol/week: 0.0 standard drinks of alcohol    Comment: rare   Drug use: No   Sexual activity: Not Currently  Other Topics Concern   Not on file  Social History Narrative   From [place].   Married.   2 children.   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   Social Drivers of Corporate investment banker Strain: Not on file  Food Insecurity: Not on file  Transportation Needs: Not on file  Physical Activity: Not on file  Stress: Not on file  Social Connections: Not on file     Family History:  The patient's family history includes Diabetes in her father, mother, and sister; Heart disease in her mother; Hypertension in her sister.  ROS:   12-point review of systems is negative unless otherwise noted in the HPI.   EKGs/Labs/Other Studies Reviewed:    Studies reviewed were summarized above. The additional studies were reviewed today:  RHC 10/19/2022: Conclusions: Moderately elevated left heart and pulmonary artery pressures. Moderately to severely elevated right heart filling pressures. Normal cardiac  output/index.   Recommendations: Escalate diuresis; furosemide  increased to 40 mg daily. Optimize goal-directed medical therapy for chronic heart failure with recovered ejection fraction __________   2D echo 08/09/2022: 1. Left ventricular ejection fraction, by estimation, is 55 to 60%. The  left ventricle has normal function. The left ventricle has no regional  wall motion abnormalities. There is mild left ventricular hypertrophy.  Left ventricular diastolic parameters  were normal.   2. Right ventricular systolic function  is normal. The right ventricular  size is normal. There is moderately elevated pulmonary artery systolic  pressure. The estimated right ventricular systolic pressure is 54.0 mmHg.   3. The mitral valve is normal in structure. Mild mitral valve  regurgitation. No evidence of mitral stenosis.   4. The aortic valve has an indeterminant number of cusps. Aortic valve  regurgitation is not visualized. No aortic stenosis is present.   5. There is borderline dilatation of the ascending aorta, measuring 37  mm.   6. The inferior vena cava is normal in size with greater than 50%  respiratory variability, suggesting right atrial pressure of 3 mmHg.  __________   Lexiscan  MPI 02/19/2020: There was no ST segment deviation noted during stress. There are severe perfusion defects of mild to moderate size present in the mid to apical anteroseptal wall, basal to mid lateral wall, and the true apex. Findings consistent with prior myocardial infarction. The left ventricular ejection fraction is normal (54%). This is a low to intermediate risk study. There was no evidence for ischemia __________   2D echo 02/12/2020: 1. Left ventricular ejection fraction, by estimation, is 50 to 55%. The  left ventricle has low normal function. The left ventricle has no regional  wall motion abnormalities. Indeterminate diastolic filling due to E-A  fusion.   2. Right ventricular systolic function  is normal. The right ventricular  size is normal.   3. Left atrial size was mildly dilated.   4. The mitral valve is normal in structure. No evidence of mitral valve  regurgitation.   5. The aortic valve is grossly normal. Aortic valve regurgitation is not  visualized.  __________   2D echo 08/08/2019: 1. Left ventricular ejection fraction, by visual estimation, is 40 to  45%. The left ventricle has severely decreased function. Mildly increased  left ventricular size. There is no left ventricular hypertrophy.   2. Left ventricular diastolic Doppler parameters are consistent with  pseudonormalization pattern of LV diastolic filling.   3. Global right ventricle has normal systolic function.The right  ventricular size is normal. No increase in right ventricular wall  thickness.   4. Left atrial size was mildly dilated.   5. Mildly elevated pulmonary artery systolic pressure.  __________   [facility] 05/22/2019: 1st Mrg lesion is 100% stenosed. 2nd Mrg lesion is 100% stenosed. Mid LAD lesion is 80% stenosed. Post intervention, there is a 0% residual stenosis. A drug-eluting stent was successfully placed using a STENT RESOLUTE ONYX 3.0X18. 2nd Diag lesion is 30% stenosed. Prox RCA to Mid RCA lesion is 50% stenosed. RPAV lesion is 40% stenosed. RPDA lesion is 30% stenosed.   1.  Significant two-vessel coronary artery disease with 80% mid LAD stenosis, occluded OM branches and moderate RCA disease. 2.  Left ventricular angiography was not performed.  EF was severely reduced by echo. 3.  Right heart catheterization showed high normal filling pressures with mean RA pressure of 8 mmHg, pulmonary capillary wedge pressure of 12 to 13 mmHg, mild pulmonary hypertension and high cardiac output at 8 L/min. 4.  Successful angioplasty and drug-eluting stent placement to the mid LAD.   Recommendations: Continue dual antiplatelet therapy for at least 1 year. The patient needs aggressive medical therapy  for the rest of her coronary artery disease.  I added high-dose atorvastatin . I added oral metoprolol  to control tachycardia. I discontinued IV furosemide  for now and she might require resumption of this either intravenously or orally as needed. The patient was alert throughout the  procedure .  Her hemodynamics are good enough to allow extubation if no other issues. __________   2D echo 05/21/2019: 1. The left ventricle has severely reduced systolic function, with an  ejection fraction of 25-30%. The cavity size was moderately dilated. Left  ventricular diastolic parameters were normal.   2. 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   3. The right ventricle has normal systolic function. The cavity was  normal. There is no increase in right ventricular wall thickness.   4. The aortic valve is tricuspid. Mild thickening of the aortic valve.   5. The aorta is normal in size and structure.    EKG:  EKG is ordered today.  The EKG ordered today demonstrates NSR, 67 bpm, LVH, no acute st/t changes  Recent Labs: 05/18/2023: BUN 13; Creatinine, Ser 0.63; Potassium 4.2; Sodium 141 09/25/2023: TSH 3.07  Recent Lipid Panel    Component Value Date/Time   CHOL 130 08/30/2023 1030   CHOL 176 01/05/2021 1504   TRIG 123.0 08/30/2023 1030   HDL 42.20 08/30/2023 1030   HDL 49 01/05/2021 1504   CHOLHDL 3 08/30/2023 1030   VLDL 24.6 08/30/2023 1030   LDLCALC 63 08/30/2023 1030   LDLCALC 98 01/05/2021 1504    PHYSICAL EXAM:    VS:  BP 136/86   Pulse 67   Ht 5\' 4"  (1.626 m)   Wt 216 lb 12.8 oz (98.3 kg)   LMP 05/19/2021 (Approximate)   SpO2 98%   BMI 37.21 kg/m   BMI: Body mass index is 37.21 kg/m.  Physical Exam Vitals reviewed.  Constitutional:      Appearance: She is well-developed.  HENT:     Head: Normocephalic and atraumatic.  Eyes:     General:        Right eye: No discharge.        Left eye: No discharge.  Neck:     Thyroid : Thyromegaly present.   Cardiovascular:     Rate and Rhythm: Normal rate and regular rhythm.     Pulses:          Posterior tibial pulses are 2+ on the right side and 2+ on the left side.     Heart sounds: Normal heart sounds, S1 normal and S2 normal. Heart sounds not distant. No midsystolic click and no opening snap. No murmur heard.    No friction rub.  Pulmonary:     Effort: Pulmonary effort is normal. No respiratory distress.     Breath sounds: Normal breath sounds. No decreased breath sounds, wheezing, rhonchi or rales.  Chest:     Chest wall: No tenderness.  Musculoskeletal:     Cervical back: Normal range of motion.     Right lower leg: No edema.     Left lower leg: No edema.  Skin:    General: Skin is warm and dry.     Nails: There is no clubbing.  Neurological:     Mental Status: She is alert and oriented to person, place, and time.  Psychiatric:        Speech: Speech normal.        Behavior: Behavior normal.        Thought Content: Thought content normal.        Judgment: Judgment normal.     Wt Readings from Last 3 Encounters:  02/12/24 216 lb 12.8 oz (98.3 kg)  09/25/23 221 lb (100.2 kg)  08/30/23 222 lb (100.7 kg)     ASSESSMENT &  PLAN:   CAD involving the native coronary arteries without angina: She is doing well from a cardiac perspective and remains without symptoms of angina or cardiac decompensation. Continue aggressive risk factor modification and secondary prevention including aspirin , atorvastatin , ezetimibe , and Toprol -XL. No indication for further ischemic testing at this time.  Not currently taking Imdur , this will be formally discontinued.  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 She remains on valsartan , Toprol -XL, Jardiance , spironolactone , and low-dose furosemide .  Defer further escalation of GDMT at this time given normalization LV systolic function, and in the context of lack of heart failure symptoms.  However, if her  cardiomyopathy were to return, would like to escalate GDMT.  Check BMP.   Pulmonary hypertension: Not a candidate for pulmonary artery vasodilators based on hemodynamics.  Underlying etiology felt to be thyroid  disorder, HFpEF, obesity, and OSA.  Continued treatment of sleep apnea with furosemide , CPAP and weight loss is encouraged.   HTN: Blood pressure is reasonably controlled in the office today.  She remains on valsartan  160 mg, spironolactone  12.5 mg, and Toprol -XL 25 mg.  HLD: LDL 63 in 08/2023.  She remains on atorvastatin  80 mg and ezetimibe  10 mg.  Hyperthyroidism: Remains on methimazole .  Followed by endocrinology.  Obesity: Weight continues to improve with lifestyle modification and semaglutide .  OSA: CPAP.   Disposition: F/u with Dr. Alvenia Aus or an APP in 6 months.   Medication Adjustments/Labs and Tests Ordered: Current medicines are reviewed at length with the patient today.  Concerns regarding medicines are outlined above. Medication changes, Labs and Tests ordered today are summarized above and listed in the Patient Instructions accessible in Encounters.   Signed, Varney Gentleman, PA-C 02/12/2024 4:40 PM     [facility] [place] [address] [place], [place] [address] [phone]

## 2024-02-12 ENCOUNTER — Encounter: Payer: Self-pay | Admitting: Physician Assistant

## 2024-02-12 ENCOUNTER — Ambulatory Visit: Attending: Physician Assistant | Admitting: Physician Assistant

## 2024-02-12 VITALS — BP 136/86 | HR 67 | Ht 64.0 in | Wt 216.8 lb

## 2024-02-12 DIAGNOSIS — I10 Essential (primary) hypertension: Secondary | ICD-10-CM | POA: Diagnosis not present

## 2024-02-12 DIAGNOSIS — I272 Pulmonary hypertension, unspecified: Secondary | ICD-10-CM

## 2024-02-12 DIAGNOSIS — Z79899 Other long term (current) drug therapy: Secondary | ICD-10-CM

## 2024-02-12 DIAGNOSIS — I2510 Atherosclerotic heart disease of native coronary artery without angina pectoris: Secondary | ICD-10-CM | POA: Diagnosis not present

## 2024-02-12 DIAGNOSIS — E0590 Thyrotoxicosis, unspecified without thyrotoxic crisis or storm: Secondary | ICD-10-CM

## 2024-02-12 DIAGNOSIS — E785 Hyperlipidemia, unspecified: Secondary | ICD-10-CM

## 2024-02-12 DIAGNOSIS — I5032 Chronic diastolic (congestive) heart failure: Secondary | ICD-10-CM

## 2024-02-12 DIAGNOSIS — Z6837 Body mass index (BMI) 37.0-37.9, adult: Secondary | ICD-10-CM

## 2024-02-12 DIAGNOSIS — E782 Mixed hyperlipidemia: Secondary | ICD-10-CM

## 2024-02-12 DIAGNOSIS — E66812 Obesity, class 2: Secondary | ICD-10-CM

## 2024-02-12 DIAGNOSIS — G4733 Obstructive sleep apnea (adult) (pediatric): Secondary | ICD-10-CM

## 2024-02-12 NOTE — Patient Instructions (Addendum)
 Medication Instructions:  Your physician recommends the following medication changes.  STOP TAKING: Imdur    *If you need a refill on your cardiac medications before your next appointment, please call your pharmacy*  Lab Work: Your provider would like for you to have following labs drawn today BMeT.   If you have labs (blood work) drawn today and your tests are completely normal, you will receive your results only by: MyChart Message (if you have MyChart) OR A paper copy in the mail If you have any lab test that is abnormal or we need to change your treatment, we will call you to review the results.  Follow-Up: At [facility], you and your health needs are our priority.  As part of our continuing mission to provide you with exceptional heart care, our providers are all part of one team.  This team includes your primary Cardiologist (physician) and Advanced Practice Providers or APPs (Physician Assistants and Nurse Practitioners) who all work together to provide you with the care you need, when you need it.  Your next appointment:   6 month(s)  Provider:   You may see Antionette Kirks, MD or Varney Gentleman, PA-C  We recommend signing up for the patient portal called "MyChart".  Sign up information is provided on this After Visit Summary.  MyChart is used to connect with patients for Virtual Visits (Telemedicine).  Patients are able to view lab/test results, encounter notes, upcoming appointments, etc.  Non-urgent messages can be sent to your provider as well.   To learn more about what you can do with MyChart, go to [email or web address].

## 2024-02-13 LAB — BASIC METABOLIC PANEL WITH GFR
BUN/Creatinine Ratio: 13 (ref 9–23)
BUN: 10 mg/dL (ref 6–24)
CO2: 22 mmol/L (ref 20–29)
Calcium: 9.5 mg/dL (ref 8.7–10.2)
Chloride: 105 mmol/L (ref 96–106)
Creatinine, Ser: 0.78 mg/dL (ref 0.57–1.00)
Glucose: 78 mg/dL (ref 70–99)
Potassium: 4.3 mmol/L (ref 3.5–5.2)
Sodium: 141 mmol/L (ref 134–144)
eGFR: 91 mL/min/{1.73_m2} (ref >59)

## 2024-02-15 ENCOUNTER — Other Ambulatory Visit: Payer: Self-pay | Admitting: Primary Care

## 2024-02-15 DIAGNOSIS — E1165 Type 2 diabetes mellitus with hyperglycemia: Secondary | ICD-10-CM

## 2024-03-06 ENCOUNTER — Ambulatory Visit (INDEPENDENT_AMBULATORY_CARE_PROVIDER_SITE_OTHER): Payer: Commercial Managed Care - PPO | Admitting: Primary Care

## 2024-03-06 ENCOUNTER — Encounter: Payer: Self-pay | Admitting: Primary Care

## 2024-03-06 ENCOUNTER — Ambulatory Visit: Payer: Self-pay | Admitting: Primary Care

## 2024-03-06 VITALS — BP 128/78 | HR 65 | Temp 97.6°F | Ht 64.0 in | Wt 214.0 lb

## 2024-03-06 DIAGNOSIS — E1165 Type 2 diabetes mellitus with hyperglycemia: Secondary | ICD-10-CM | POA: Diagnosis not present

## 2024-03-06 DIAGNOSIS — Z7985 Long-term (current) use of injectable non-insulin antidiabetic drugs: Secondary | ICD-10-CM

## 2024-03-06 DIAGNOSIS — Z7984 Long term (current) use of oral hypoglycemic drugs: Secondary | ICD-10-CM

## 2024-03-06 LAB — POCT GLYCOSYLATED HEMOGLOBIN (HGB A1C): Hemoglobin A1C: 5.6 % (ref 4.0–5.6)

## 2024-03-06 NOTE — Patient Instructions (Signed)
 Continue Ozempic  2 mg weekly and metformin  ER 500 mg daily for diabetes.  Please schedule a physical to meet with me in 6 months.   It was a pleasure to see you today!

## 2024-03-06 NOTE — Progress Notes (Signed)
 Subjective:    Patient ID: Amber [name], female    DOB: 1971/09/01, 52 y.o.   MRN: [id number]  HPI  Amber [name] is a very pleasant 53 y.o. female with a history of type 2 diabetes, hypertension, CAD, CHF, hypothyroidism, hyperlipidemia who presents today for follow-up of diabetes.  Current medications include: Metformin  ER 500 mg daily, Jardiance  10 mg daily, Ozempic  2 mg weekly. Patient has not taken Jardiance  due to cost.  She is checking her blood glucose 0 times daily.  Last A1C: 6.0 in November 2024, 5.6 today Last Eye Exam: Up-to-date Last Foot Exam: Due Pneumonia Vaccination: 2020 Urine Microalbumin: Up-to-date Statin: Atorvastatin   Dietary changes since last visit: Smaller portion sizes   Exercise: No regular exercise, plans on starting to exercise.   Wt Readings from Last 3 Encounters:  03/06/24 214 lb (97.1 kg)  02/12/24 216 lb 12.8 oz (98.3 kg)  09/25/23 221 lb (100.2 kg)       Review of Systems  Eyes:  Negative for visual disturbance.  Cardiovascular:  Negative for chest pain.  Endocrine: Negative for polydipsia, polyphagia and polyuria.  Neurological:  Negative for dizziness and numbness.         Past Medical History:  Diagnosis Date   Acute encephalopathy    Acute pulmonary edema (HCC)    Acute respiratory failure with hypoxia (HCC) 05/20/2019   Anemia    Aortic dilatation (HCC)    37 mm (ascending aorta) 07/2022   CAD (coronary artery disease)    s/p DES to mLAD in 2020, NSTEMI, HFrEF with improvement in EF.   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   Thyroid  storm    Unexplained weight loss 03/27/2019   Valvular insufficiency    Mild MR mild TR noted on echocardiogram in October 2023.    Social History   Socioeconomic History   Marital status: Married    Spouse name: Not on file   Number of children: 2   Years of education: Not on file   Highest education level: Not on file  Occupational History    Comment: front desk   Tobacco Use   Smoking status: Never   Smokeless tobacco: Never  Vaping Use   Vaping status: Never Used  Substance and Sexual Activity   Alcohol use: Yes    Alcohol/week: 0.0 standard drinks of alcohol    Comment: rare   Drug use: No   Sexual activity: Not Currently  Other Topics Concern   Not on file  Social History Narrative   From [place].   Married.   2 children.   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   Social Drivers of Health  Financial Resource Strain: Not on file  Food Insecurity: Not on file  Transportation Needs: Not on file  Physical Activity: Not on file  Stress: Not on file  Social Connections: Not on file  Intimate Partner Violence: Not on file    Past Surgical History:  Procedure Laterality Date   CARDIAC CATHETERIZATION     CESAREAN SECTION     x2   COLONOSCOPY N/A 06/24/2021   Procedure: COLONOSCOPY;  Surgeon: Luke Salaam, MD;  Location: [facility] ENDOSCOPY;  Service: Gastroenterology;  Laterality: N/A;   CORONARY STENT INTERVENTION N/A 05/22/2019   Procedure: CORONARY STENT INTERVENTION;  Surgeon: Wenona Hamilton, MD;  Location: [facility] INVASIVE CV LAB;  Service: Cardiovascular;  Laterality: N/A;   MASS EXCISION Right  01/18/2013   Procedure: EXCISION right scapular cyst;  Surgeon: Shela Derby, MD;  Location: [facility] ORS;  Service: General;  Laterality: Right;   RIGHT HEART CATH Right 10/19/2022   Procedure: RIGHT HEART CATH;  Surgeon: Sammy Crisp, MD;  Location: [facility] INVASIVE CV LAB;  Service: Cardiovascular;  Laterality: Right;   RIGHT/LEFT HEART CATH AND CORONARY ANGIOGRAPHY N/A 05/22/2019   Procedure: RIGHT/LEFT HEART CATH AND CORONARY ANGIOGRAPHY;  Surgeon: Wenona Hamilton, MD;  Location: [facility] INVASIVE CV LAB;  Service: Cardiovascular;  Laterality: N/A;    Family History  Problem Relation Age of Onset   Diabetes Father    Diabetes Mother    Heart disease Mother    Diabetes Sister    Hypertension Sister     No Known Allergies  Current Outpatient Medications on File Prior to Visit  Medication Sig Dispense Refill   aspirin  81 MG chewable tablet Chew 1 tablet (81 mg total) by mouth daily. 30 tablet 11   atorvastatin  (LIPITOR ) 80 MG tablet TAKE 1 TABLET BY MOUTH DAILY AT 6 PM. 90 tablet 0   calcium -vitamin D  250-100 MG-UNIT tablet Take 1 tablet by mouth daily.     ezetimibe  (ZETIA ) 10 MG tablet TAKE 1 TABLET BY MOUTH EVERY DAY 90 tablet 0   ferrous sulfate  325 (65 FE) MG tablet Take 325 mg by mouth 3 (three) times a week. No set days     furosemide  (LASIX ) 40 MG tablet Take 1 tablet (40 mg total) by mouth daily. 90 tablet 0   metFORMIN  (GLUCOPHAGE -XR) 500 MG 24 hr tablet TAKE 1 TABLET (500 MG TOTAL) BY MOUTH DAILY WITH BREAKFAST. FOR DIABETES. 90 tablet 0   methimazole  (TAPAZOLE ) 5 MG tablet Take 2 tablets (10 mg total) by mouth 2 (two) times daily. 360 tablet 2   metoprolol  succinate (TOPROL -XL) 25 MG 24 hr tablet TAKE 1 TABLET BY MOUTH EVERY DAY 30 tablet 2   nitroGLYCERIN  (NITROSTAT ) 0.4 MG SL tablet Place 1 tablet (0.4 mg total) under the tongue every 5 (five) minutes as needed for chest pain. 25 tablet 1   Semaglutide , 2 MG/DOSE, (OZEMPIC , 2 MG/DOSE,) 8 MG/3ML SOPN Inject 2 mg into the skin once a  week. for diabetes. 9 mL 0   spironolactone  (ALDACTONE ) 25 MG tablet TAKE 1/2 TABLET BY MOUTH EVERY DAY 45 tablet 0   valsartan  (DIOVAN ) 160 MG tablet Take 1 tablet (160 mg total) by mouth daily. 90 tablet 3   potassium chloride  (KLOR-CON ) 10 MEQ tablet TAKE 1 TABLET (10 MEQ TOTAL) BY MOUTH DAILY. PLEASE COME TO OFFICE FOR LABS. (Patient not taking: Reported on 03/06/2024) 30 tablet 0   Current Facility-Administered Medications on File Prior to Visit  Medication Dose Route Frequency Provider Last Rate Last Admin  sodium chloride  flush (NS) 0.9 % injection 3 mL  3 mL Intravenous Q12H Hammock, Sheri, NP        BP 128/78   Pulse 65   Temp 97.6 F (36.4 C) (Temporal)   Ht 5\' 4"  (1.626 m)   Wt 214 lb (97.1 kg)   LMP 05/19/2021 (Approximate)   SpO2 96%   BMI 36.73 kg/m  Objective:   Physical Exam Cardiovascular:     Rate and Rhythm: Normal rate and regular rhythm.  Pulmonary:     Effort: Pulmonary effort is normal.     Breath sounds: Normal breath sounds.  Musculoskeletal:     Cervical back: Neck supple.  Skin:    General: Skin is warm and dry.  Neurological:     Mental Status: She is alert and oriented to person, place, and time.  Psychiatric:        Mood and Affect: Mood normal.           Assessment & Plan:  Type 2 diabetes mellitus with hyperglycemia, without long-term current use of insulin (HCC) Assessment & Plan: Improved and controlled with A1C of 5.6 today!  Continue Ozempic  2 mg weekly, metformin  ER 500 mg daily. Consider discontinuation of metformin  at next visit.  Foot exam today.  Follow-up in 6 months.  Orders: -     POCT glycosylated hemoglobin (Hb A1C)        Gabriel John, NP

## 2024-03-06 NOTE — Assessment & Plan Note (Signed)
 Improved and controlled with A1C of 5.6 today!  Continue Ozempic  2 mg weekly, metformin  ER 500 mg daily. Consider discontinuation of metformin  at next visit.  Foot exam today.  Follow-up in 6 months.

## 2024-03-09 ENCOUNTER — Other Ambulatory Visit: Payer: Self-pay | Admitting: Cardiovascular Disease

## 2024-03-09 ENCOUNTER — Other Ambulatory Visit: Payer: Self-pay | Admitting: Physician Assistant

## 2024-03-09 ENCOUNTER — Other Ambulatory Visit: Payer: Self-pay | Admitting: Primary Care

## 2024-03-09 DIAGNOSIS — E1165 Type 2 diabetes mellitus with hyperglycemia: Secondary | ICD-10-CM

## 2024-03-20 ENCOUNTER — Encounter: Payer: Self-pay | Admitting: Internal Medicine

## 2024-03-20 ENCOUNTER — Ambulatory Visit (INDEPENDENT_AMBULATORY_CARE_PROVIDER_SITE_OTHER): Admitting: Internal Medicine

## 2024-03-20 VITALS — BP 130/82 | HR 68 | Ht 64.0 in | Wt 214.0 lb

## 2024-03-20 DIAGNOSIS — E0500 Thyrotoxicosis with diffuse goiter without thyrotoxic crisis or storm: Secondary | ICD-10-CM | POA: Diagnosis not present

## 2024-03-20 DIAGNOSIS — E0590 Thyrotoxicosis, unspecified without thyrotoxic crisis or storm: Secondary | ICD-10-CM

## 2024-03-20 LAB — T4, FREE: Free T4: 1 ng/dL (ref 0.8–1.8)

## 2024-03-20 LAB — T3, FREE: T3, Free: 2.9 pg/mL (ref 2.3–4.2)

## 2024-03-20 LAB — TSH: TSH: 3.31 m[IU]/L

## 2024-03-20 NOTE — Progress Notes (Unsigned)
 Name: Amber [name]  MRN/ DOB: [id number], 1971-08-18    Age/ Sex: 53 y.o., female     PCP: Gabriel John, NP   Reason for Endocrinology Evaluation: Hyperthytroidism     Initial Endocrinology Clinic Visit: 04/03/2019    PATIENT IDENTIFIER: Amber [name] is a 53 y.o., female with a past medical history of HTN and CAD (S/P PCI 05/2019).She has followed with [facility] since 04/03/2019 for consultative assistance with management of her Hyperthyrodism     HISTORICAL SUMMARY: The patient was first diagnosed with hyperthyroidism in 03/2019, with a suppressed TSH at < 0.01 uIU/mL with elevated FT4 5.96 ng/dL. She did have weight loss  For ~ 4 months prior to her presentation.  Methimazole  started in 03/2019  SUBJECTIVE:     Today (03/20/2024):  Amber [name] is here for a follow up on hyperthyroidism and Graves' disease.  She continues to follow-up with cardiology for CAD  Weight continues to trend down Denies local neck swelling  Denies palpitations  Denies constipation or diarrhea  Has fatigue  No burning or itching in the eyes, but has dry eyes   Methimazole  5 mg, 2 tabs  BID    HISTORY:  Past Medical History:  Past Medical History:  Diagnosis Date   Acute encephalopathy    Acute pulmonary edema (HCC)    Acute respiratory failure with hypoxia (HCC) 05/20/2019   Anemia    Aortic dilatation (HCC)    37 mm (ascending aorta) 07/2022   CAD (coronary artery disease)    s/p DES to mLAD in 2020, NSTEMI, HFrEF with improvement in EF.   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   Thyroid  storm    Unexplained weight loss 03/27/2019   Valvular insufficiency    Mild MR mild TR noted on echocardiogram in October 2023.   Past Surgical History:  Past Surgical History:  Procedure Laterality Date   CARDIAC CATHETERIZATION     CESAREAN SECTION     x2   COLONOSCOPY N/A 06/24/2021   Procedure: COLONOSCOPY;  Surgeon: Luke Salaam, MD;  Location: [facility] ENDOSCOPY;  Service: Gastroenterology;  Laterality: N/A;   CORONARY STENT INTERVENTION N/A 05/22/2019   Procedure: CORONARY STENT INTERVENTION;  Surgeon: Wenona Hamilton, MD;  Location: [facility] INVASIVE CV LAB;  Service: Cardiovascular;  Laterality: N/A;   MASS EXCISION Right 01/18/2013   Procedure: EXCISION right scapular cyst;  Surgeon: Shela Derby, MD;  Location: [facility] ORS;  Service: General;  Laterality: Right;   RIGHT HEART CATH Right 10/19/2022   Procedure: RIGHT HEART CATH;  Surgeon: Sammy Crisp, MD;  Location: [facility] INVASIVE CV LAB;  Service: Cardiovascular;  Laterality: Right;   RIGHT/LEFT HEART CATH AND CORONARY ANGIOGRAPHY N/A 05/22/2019   Procedure: RIGHT/LEFT HEART CATH  AND CORONARY ANGIOGRAPHY;  Surgeon: Wenona Hamilton, MD;  Location: [facility] INVASIVE CV LAB;  Service: Cardiovascular;  Laterality: N/A;   Social History:  reports that she has never smoked. She has never used smokeless tobacco. She reports current alcohol use. She reports that she does not use drugs. Family History:  Family History  Problem Relation Age of Onset   Diabetes Father    Diabetes Mother    Heart disease Mother    Diabetes Sister    Hypertension Sister      HOME MEDICATIONS: Allergies as of 03/20/2024   No Known Allergies       Medication List        Accurate as of March 20, 2024  1:57 PM. If you have any questions, ask your nurse or doctor.          aspirin  81 MG chewable tablet Chew 1 tablet (81 mg total) by mouth daily.   atorvastatin  80 MG tablet Commonly known as: LIPITOR  TAKE 1 TABLET BY MOUTH DAILY AT 6 PM.   calcium -vitamin D  250-100 MG-UNIT tablet Take 1 tablet by mouth daily.   ezetimibe  10 MG tablet Commonly known as: ZETIA  TAKE 1 TABLET BY MOUTH EVERY DAY   ferrous sulfate  325 (65 FE) MG tablet Take 325 mg by mouth 3 (three) times a week. No set days   furosemide  40 MG tablet Commonly known as: LASIX  Take 1 tablet (40 mg total) by mouth daily.   metFORMIN  500 MG 24 hr tablet Commonly known as: GLUCOPHAGE -XR TAKE 1 TABLET (500 MG TOTAL) BY MOUTH DAILY WITH BREAKFAST. FOR DIABETES.   methimazole  5 MG tablet Commonly known as: TAPAZOLE  Take 2 tablets (10 mg total) by mouth 2 (two) times daily.   metoprolol  succinate 25 MG 24 hr tablet Commonly known as: TOPROL -XL TAKE 1 TABLET BY MOUTH EVERY DAY   nitroGLYCERIN  0.4 MG SL tablet Commonly known as: Nitrostat  Place 1 tablet (0.4 mg total) under the tongue every 5 (five) minutes as needed for chest pain.   Ozempic  (2 MG/DOSE) 8 MG/3ML Sopn Generic drug: Semaglutide  (2 MG/DOSE) Inject 2 mg into the skin once a week. for diabetes.   potassium chloride  10 MEQ tablet Commonly known as: KLOR-CON  TAKE 1 TABLET (10 MEQ TOTAL) BY MOUTH DAILY. PLEASE COME TO OFFICE FOR LABS.   spironolactone  25 MG tablet Commonly known as: ALDACTONE  TAKE 1/2 TABLET BY MOUTH DAILY   valsartan  160 MG tablet Commonly known as: DIOVAN  TAKE 1 TABLET BY MOUTH EVERY DAY          OBJECTIVE:   PHYSICAL EXAM: VS: BP 130/82 (BP Location: Left Arm, Patient Position: Sitting, Cuff Size: Normal)   Pulse 68   Ht 5\' 4"  (1.626 m)   Wt 214 lb (97.1 kg)   LMP 05/19/2021 (Approximate)   SpO2 97%   BMI 36.73 kg/m    EXAM: General: Pt appears well and  is in NAD  Neck: General: Supple without adenopathy. Thyroid : Thyroid  size is prominent.  Lungs: Clear with good BS bilat   Heart: Auscultation: RRR.  Extremities:  BL LE: No pretibial edema  Mental Status: Mood and affect: No depression, anxiety, or agitation     DATA REVIEWED:   Latest Reference Range & Units 09/25/23 11:41  TSH mIU/L 3.07  Triiodothyronine,Free,Serum 2.3 - 4.2 pg/mL 3.2  T4,Free(Direct) 0.8 - 1.8 ng/dL 0.9      Thyroid  ultrasound 01/06/2022  Estimated total number of nodules >/= 1 cm: 0   Number of spongiform nodules >/=  2 cm not described below (TR1): 0   Number of mixed cystic and solid nodules >/= 1.5 cm not described below (TR2): 0   _________________________________________________________   Marked thyroid  heterogeneity and mild diffuse enlargement. Background pseudo nodularity noted. No hypervascularity. No discrete nodule or focal abnormality. No regional adenopathy.   IMPRESSION: Heterogeneous mildly enlarged thyroid  compatible with chronic medical thyroid  disease.   Negative for nodule.    ASSESSMENT / PLAN / RECOMMENDATIONS:   Hyperthyroidism Secondary to Graves' Disease:  -Patient is clinically euthyroid -Today we discussed alternative treatment options to include RAI ablation versus surgery versus continue methimazole  -If alternative treatment is needed, I do recommend total thyroidectomy, due to the risk of Graves' orbitopathy with RAI, as the patient already has mild right eye proptosis -TFT's normal , no change    Medications  Continue methimazole  5 mg,2  tabs BID   2. Graves' Disease:   -Patient with minimal right eye stare   Follow-up in 4 months    Signed electronically by: Natale Bail, MD  [facility] Endocrinology  [facility] [address] [place] [place], [place] [address] Phone: [phone] FAX: [phone]      CC: Gabriel John, NP [address] [place]  [address] Phone: [phone]  Fax: [phone]   Return to Endocrinology clinic as below: Future Appointments  Date Time Provider Department Center  03/20/2024  2:20 PM Dequita Schleicher, Julian Obey, MD [facility] None  09/06/2024 11:00 AM Gabriel John, NP [facility]

## 2024-03-22 ENCOUNTER — Ambulatory Visit: Payer: Self-pay | Admitting: Internal Medicine

## 2024-03-22 MED ORDER — METHIMAZOLE 5 MG PO TABS: 10.0000 mg | ORAL_TABLET | Freq: Two times a day (BID) | ORAL | 2 refills | Status: DC

## 2024-03-25 ENCOUNTER — Other Ambulatory Visit: Payer: Self-pay | Admitting: Cardiovascular Disease

## 2024-05-19 ENCOUNTER — Other Ambulatory Visit: Payer: Self-pay | Admitting: Primary Care

## 2024-05-19 DIAGNOSIS — E1165 Type 2 diabetes mellitus with hyperglycemia: Secondary | ICD-10-CM

## 2024-05-20 ENCOUNTER — Other Ambulatory Visit (HOSPITAL_COMMUNITY): Payer: Self-pay

## 2024-05-20 ENCOUNTER — Telehealth: Payer: Self-pay

## 2024-05-20 NOTE — Telephone Encounter (Addendum)
 Pharmacy Patient Advocate Encounter   Received notification from CoverMyMeds that prior authorization for Ozempic  (2 MG/DOSE) 8MG /3ML pen-injectors is required/requested.   Insurance verification completed.   The patient is insured through [employer].   CMM KEY [id number]  Per test claim: PA required; PA submitted to above mentioned insurance via Prompt PA Key/confirmation #/EOC [id number] Status is pending  MEMBER ID [id number]

## 2024-05-21 NOTE — Telephone Encounter (Signed)
 Pharmacy Patient Advocate Encounter  Received notification from [employer] that Prior Authorization for OZEMPIC  2 MG/DOSE (8 MG/3 ML) has been APPROVED from 05/20/2024 to 05/19/2025   Letter of approval in media of chart

## 2024-05-21 NOTE — Telephone Encounter (Signed)
 LVM per dpr advising patient approval of Ozempic  2mg 

## 2024-07-03 ENCOUNTER — Other Ambulatory Visit: Payer: Self-pay | Admitting: Cardiovascular Disease

## 2024-07-25 ENCOUNTER — Other Ambulatory Visit: Payer: Self-pay | Admitting: Internal Medicine

## 2024-08-21 NOTE — Progress Notes (Unsigned)
 Cardiology Office Note    Date:  08/23/2024   ID:  Amber [name], DOB October 28, 1970, MRN [id number]  PCP:  Gretta Comer POUR, NP  Cardiologist:  Deatrice Cage, MD  Electrophysiologist:  None   Chief Complaint: Follow up  History of Present Illness:   Amber [name] is a 53 y.o. 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   She was diagnosed with severe hyperthyroidism in 05/2019 and admitted with acute respiratory failure requiring mechanical ventilation.  Admission was complicated by NSTEMI and pulmonary edema.  Echo showed an EF of 25 to 30% with wall motion abnormalities suggestive of Takotsubo cardiomyopathy versus anterior infarct.  She underwent R/LHC and was found to have two-vessel CAD with 80% stenosis in the mid LAD, occluded OM branches, and moderate RCA disease.  PCI and DES placement was done to the mid LAD.  Heart failure medications were optimized and hypothyroidism was treated.  Echo in 07/2019 showed an EF of 40 to 45%.  She was admitted in 01/2020 with atypical chest pain and ruled out for MI.  Outpatient Lexiscan  MPI showed evidence of prior anterior infarct without ischemia.  Echo showed an EF of 50 to 55% with no significant valvular abnormalities.   Echo in 07/2022 demonstrated an EF of 55 to 60%, no regional wall motion abnormalities, mild LVH, normal LV diastolic function parameters, normal RV systolic function and ventricular cavity size, moderately elevated PASP estimated at 54 mmHg, mild mitral regurgitation, borderline dilatation of the ascending aorta measuring 37 mm, and an estimated  right atrial pressure of 3 mmHg.   She has also been followed by pulmonology with VQ scan showing normal perfusion.  High resolution chest CT showed no findings suggestive of ILD with mild air trapping indicative of small airway disease, cardiomegaly, and coronary artery calcification with aortic atherosclerosis.     She underwent RHC on 10/19/2022 that showed moderately elevated left heart and pulmonary artery pressures with moderately to severely elevated right heart filling pressures and normal cardiac output/index.  Recommendation was to escalate diuresis and optimize GDMT.   PFTs consistent with severe restrictive lung disease.  Sleep study positive for sleep apnea.   She was seen on 11/01/2022 with unchanged dyspnea despite escalation of diuretic.  She was started on Jardiance  and referred to the advanced heart failure clinic.  She was not a candidate for selective pulmonary artery vasodilators based on hemodynamics.  She was advised to follow-up with pulmonology for CPAP and PCP for consideration of GLP1 agonist.  She was seen in the office on 03/07/2023 and noted improvement in her breathing.  She was getting used to CPAP.  Her weight was down 14 pounds when compared to her visit in 11/2022, which was intentional through lifestyle modification and initiation of semaglutide .  She was placed on valsartan  in place of losartan  for further management of hypertension.  She was last seen in the office in 01/2024 and was doing very well from a cardiac perspective with improvement in breathing.  Weight was down another 12 pounds compared to her visit in 05/2023, which was intentional.  She was adherent to CPAP and cardiac medications.  No  changes in cardiac pharmacotherapy were pursued at that time.  She comes in today continuing to do very well from a cardiac perspective and is without symptoms of angina or cardiac decompensation.  Was treated for pneumonia back in August in her [place] after developing  shortness of breath while swimming in a large river.  Has been without symptoms of shortness of breath since.  No lower extremity swelling or progressive orthopnea.  Weight largely stable.  Unable to take SGLT2 inhibitor secondary to financial constraints.  No falls or symptoms concerning for bleeding.  No dizziness, presyncope, or syncope.  Overall feels very well from a cardiac perspective and does not have any acute cardiac concerns at this time.   Labs independently reviewed: 03/2024 - TSH normal, free T3/4 normal 02/2024 - A1c 5.6 01/2024 - BUN 10, serum creatinine 0.78, potassium 4.3 08/2023 - TC 130, TG 123, HDL 42, LDL 63 09/2022 - Hgb 13.4, PLT 301  Past Medical History:  Diagnosis Date   Acute encephalopathy    Acute pulmonary edema (HCC)    Acute respiratory failure with hypoxia (HCC) 05/20/2019   Anemia    Aortic dilatation    37 mm (ascending aorta) 07/2022   CAD (coronary artery disease)    s/p DES to mLAD in 2020, NSTEMI, HFrEF with improvement in EF.   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   Thyroid  storm    Unexplained weight loss 03/27/2019   Valvular insufficiency    Mild MR mild TR noted on echocardiogram in October 2023.    Past Surgical History:  Procedure Laterality  Date   CARDIAC CATHETERIZATION     CESAREAN SECTION     x2   COLONOSCOPY N/A 06/24/2021   Procedure: COLONOSCOPY;  Surgeon: Therisa Bi, MD;  Location: [facility] ENDOSCOPY;  Service: Gastroenterology;  Laterality: N/A;   CORONARY STENT INTERVENTION N/A 05/22/2019   Procedure: CORONARY STENT INTERVENTION;  Surgeon: Darron Deatrice LABOR, MD;  Location: [facility] INVASIVE CV LAB;  Service: Cardiovascular;  Laterality: N/A;   MASS EXCISION Right 01/18/2013   Procedure: EXCISION right scapular cyst;  Surgeon: Lynda Leos, MD;  Location: [facility] ORS;  Service: General;  Laterality: Right;   RIGHT HEART CATH Right 10/19/2022   Procedure: RIGHT HEART CATH;  Surgeon: Mady Bruckner, MD;  Location: [facility] INVASIVE CV LAB;  Service: Cardiovascular;  Laterality: Right;   RIGHT/LEFT HEART CATH AND CORONARY ANGIOGRAPHY N/A 05/22/2019   Procedure: RIGHT/LEFT HEART CATH AND CORONARY ANGIOGRAPHY;  Surgeon: Darron Deatrice LABOR, MD;  Location: [facility] INVASIVE CV LAB;  Service: Cardiovascular;  Laterality: N/A;    Current Medications: Current Meds  Medication Sig   aspirin  81 MG chewable tablet Chew 1 tablet (81 mg total) by mouth daily.   atorvastatin  (LIPITOR )  80 MG tablet TAKE 1 TABLET BY MOUTH DAILY AT 6 PM.   calcium -vitamin D  250-100 MG-UNIT tablet Take 1 tablet by mouth daily.   ezetimibe  (ZETIA ) 10 MG tablet TAKE 1 TABLET BY MOUTH EVERY DAY   ferrous sulfate  325 (65 FE) MG tablet Take 325 mg by mouth 3 (three) times a week. No set days   furosemide  (LASIX ) 40 MG tablet TAKE 1 TABLET BY MOUTH EVERY DAY   metFORMIN  (GLUCOPHAGE -XR) 500 MG 24 hr tablet TAKE 1 TABLET (500 MG TOTAL) BY MOUTH DAILY WITH BREAKFAST. FOR DIABETES.   methimazole  (TAPAZOLE ) 5 MG tablet Take 2 tablets (10 mg total) by mouth 2 (two) times daily.   metoprolol  succinate (TOPROL -XL) 25 MG 24 hr tablet TAKE 1 TABLET BY MOUTH EVERY DAY   nitroGLYCERIN  (NITROSTAT ) 0.4 MG SL tablet Place 1 tablet (0.4 mg total) under the tongue every 5 (five) minutes as needed for  chest pain.   potassium chloride  (KLOR-CON ) 10 MEQ tablet TAKE 1 TABLET (10 MEQ TOTAL) BY MOUTH DAILY. PLEASE COME TO OFFICE FOR LABS.   Semaglutide , 2 MG/DOSE, (OZEMPIC , 2 MG/DOSE,) 8 MG/3ML SOPN INJECT 2 MG INTO THE SKIN ONCE A WEEK. FOR DIABETES.   spironolactone  (ALDACTONE ) 25 MG tablet TAKE 1/2 TABLET BY MOUTH DAILY   valsartan  (DIOVAN ) 160 MG tablet TAKE 1 TABLET BY MOUTH EVERY DAY    Allergies:   Patient has no known allergies.   Social History   Socioeconomic History   Marital status: Married    Spouse name: Not on file   Number of children: 2   Years of education: Not on file   Highest education level: Not on file  Occupational History    Comment: front desk   Tobacco Use   Smoking status: Never   Smokeless tobacco: Never  Vaping Use   Vaping status: Never Used  Substance and Sexual Activity   Alcohol use: Yes    Alcohol/week: 0.0 standard drinks of alcohol    Comment: rare   Drug use: No   Sexual activity: Not Currently  Other Topics Concern   Not on file  Social History Narrative   From [place].   Married.   2 children.   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   Social Drivers of Corporate Investment Banker Strain: Not on file  Food Insecurity: Not on file  Transportation Needs: Not on file  Physical Activity: Not on file  Stress: Not on file  Social Connections: Not on file     Family History:  The patient's family history includes Diabetes in her father, mother, and sister; Heart disease in her mother; Hypertension in her sister.  ROS:   12-point review of systems is negative unless otherwise noted in the HPI.   EKGs/Labs/Other Studies Reviewed:    Studies reviewed were summarized above. The additional studies were reviewed today:  RHC 10/19/2022: Conclusions: Moderately elevated left heart and pulmonary artery pressures. Moderately to severely elevated right heart filling  pressures. Normal cardiac output/index.   Recommendations: Escalate diuresis; furosemide  increased to 40 mg daily. Optimize goal-directed medical therapy for chronic heart failure with recovered ejection fraction __________   2D echo 08/09/2022: 1. Left ventricular ejection fraction, by estimation, is 55 to 60%. The  left ventricle has normal function. The left ventricle has no regional  wall motion abnormalities. There is mild left ventricular hypertrophy.  Left ventricular diastolic parameters  were normal.   2.  Right ventricular systolic function is normal. The right ventricular  size is normal. There is moderately elevated pulmonary artery systolic  pressure. The estimated right ventricular systolic pressure is 54.0 mmHg.   3. The mitral valve is normal in structure. Mild mitral valve  regurgitation. No evidence of mitral stenosis.   4. The aortic valve has an indeterminant number of cusps. Aortic valve  regurgitation is not visualized. No aortic stenosis is present.   5. There is borderline dilatation of the ascending aorta, measuring 37  mm.   6. The inferior vena cava is normal in size with greater than 50%  respiratory variability, suggesting right atrial pressure of 3 mmHg.  __________   Lexiscan  MPI 02/19/2020: There was no ST segment deviation noted during stress. There are severe perfusion defects of mild to moderate size present in the mid to apical anteroseptal wall, basal to mid lateral wall, and the true apex. Findings consistent with prior myocardial infarction. The left ventricular ejection fraction is normal (54%). This is a low to intermediate risk study. There was no evidence for ischemia __________   2D echo 02/12/2020: 1. Left ventricular ejection fraction, by estimation, is 50 to 55%. The  left ventricle has low normal function. The left ventricle has no regional  wall motion abnormalities. Indeterminate diastolic filling due to E-A  fusion.   2. Right  ventricular systolic function is normal. The right ventricular  size is normal.   3. Left atrial size was mildly dilated.   4. The mitral valve is normal in structure. No evidence of mitral valve  regurgitation.   5. The aortic valve is grossly normal. Aortic valve regurgitation is not  visualized.  __________   2D echo 08/08/2019: 1. Left ventricular ejection fraction, by visual estimation, is 40 to  45%. The left ventricle has severely decreased function. Mildly increased  left ventricular size. There is no left ventricular hypertrophy.   2. Left ventricular diastolic Doppler parameters are consistent with  pseudonormalization pattern of LV diastolic filling.   3. Global right ventricle has normal systolic function.The right  ventricular size is normal. No increase in right ventricular wall  thickness.   4. Left atrial size was mildly dilated.   5. Mildly elevated pulmonary artery systolic pressure.  __________   [facility] 05/22/2019: 1st Mrg lesion is 100% stenosed. 2nd Mrg lesion is 100% stenosed. Mid LAD lesion is 80% stenosed. Post intervention, there is a 0% residual stenosis. A drug-eluting stent was successfully placed using a STENT RESOLUTE ONYX 3.0X18. 2nd Diag lesion is 30% stenosed. Prox RCA to Mid RCA lesion is 50% stenosed. RPAV lesion is 40% stenosed. RPDA lesion is 30% stenosed.   1.  Significant two-vessel coronary artery disease with 80% mid LAD stenosis, occluded OM branches and moderate RCA disease. 2.  Left ventricular angiography was not performed.  EF was severely reduced by echo. 3.  Right heart catheterization showed high normal filling pressures with mean RA pressure of 8 mmHg, pulmonary capillary wedge pressure of 12 to 13 mmHg, mild pulmonary hypertension and high cardiac output at 8 L/min. 4.  Successful angioplasty and drug-eluting stent placement to the mid LAD.   Recommendations: Continue dual antiplatelet therapy for at least 1 year. The patient  needs aggressive medical therapy for the rest of her coronary artery disease.  I added high-dose atorvastatin . I added oral metoprolol  to control tachycardia. I discontinued IV furosemide  for now and she might require resumption of this either intravenously or orally as needed. The patient  was alert throughout the procedure .  Her hemodynamics are good enough to allow extubation if no other issues. __________   2D echo 05/21/2019: 1. The left ventricle has severely reduced systolic function, with an  ejection fraction of 25-30%. The cavity size was moderately dilated. Left  ventricular diastolic parameters were normal.   2. 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   3. The right ventricle has normal systolic function. The cavity was  normal. There is no increase in right ventricular wall thickness.   4. The aortic valve is tricuspid. Mild thickening of the aortic valve.   5. The aorta is normal in size and structure.    EKG:  EKG is ordered today.  The EKG ordered today demonstrates NSR, 71 bpm, LVH, no acute ST-T changes  Recent Labs: 02/12/2024: BUN 10; Creatinine, Ser 0.78; Potassium 4.3; Sodium 141 03/20/2024: TSH 3.31  Recent Lipid Panel    Component Value Date/Time   CHOL 130 08/30/2023 1030   CHOL 176 01/05/2021 1504   TRIG 123.0 08/30/2023 1030   HDL 42.20 08/30/2023 1030   HDL 49 01/05/2021 1504   CHOLHDL 3 08/30/2023 1030   VLDL 24.6 08/30/2023 1030   LDLCALC 63 08/30/2023 1030   LDLCALC 98 01/05/2021 1504    PHYSICAL EXAM:    VS:  BP (!) 136/90 (BP Location: Left Arm, Patient Position: Sitting, Cuff Size: Large)   Pulse 71 Comment: 80 oximeter  Ht 5' 4 (1.626 m)   Wt 220 lb (99.8 kg)   LMP 05/19/2021 (Approximate)   SpO2 97%   BMI 37.76 kg/m   BMI: Body mass index is 37.76 kg/m.  Physical Exam Vitals reviewed.  Constitutional:      Appearance: She is well-developed.  HENT:     Head: Normocephalic and atraumatic.  Eyes:      General:        Right eye: No discharge.        Left eye: No discharge.  Cardiovascular:     Rate and Rhythm: Normal rate and regular rhythm.     Heart sounds: Normal heart sounds, S1 normal and S2 normal. Heart sounds not distant. No midsystolic click and no opening snap. No murmur heard.    No friction rub.  Pulmonary:     Effort: Pulmonary effort is normal. No respiratory distress.     Breath sounds: Normal breath sounds. No decreased breath sounds, wheezing, rhonchi or rales.  Musculoskeletal:     Cervical back: Normal range of motion.     Right lower leg: No edema.     Left lower leg: No edema.  Skin:    General: Skin is warm and dry.     Nails: There is no clubbing.  Neurological:     Mental Status: She is alert and oriented to person, place, and time.  Psychiatric:        Speech: Speech normal.        Behavior: Behavior normal.        Thought Content: Thought content normal.        Judgment: Judgment normal.     Wt Readings from Last 3 Encounters:  08/23/24 220 lb (99.8 kg)  03/20/24 214 lb (97.1 kg)  03/06/24 214 lb (97.1 kg)     ASSESSMENT & PLAN:   CAD involving the native coronary arteries without angina: She continues to do very well from a cardiac perspective and remains without symptoms of angina or cardiac decompensation.  Continue aggressive risk factor  modification and secondary prevention including aspirin  81 mg, atorvastatin  80 mg, ezetimibe  10 mg, and Toprol -XL 25 mg.  No indication for further ischemic testing at this time.  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  She remains on valsartan  160 mg, spironolactone  12.5 mg, furosemide  40 mg daily, and Toprol -XL 25 mg.  Unable to take SGLT2 inhibitor secondary to financial constraints.  Given normalization of LV systolic function, and in the context of lack of heart failure symptoms, defer further escalation of GDMT at this time.  Check  BMP.  Pulmonary hypertension: Asymptomatic.  Not a candidate for pulmonary artery vasodilators based on hemodynamics.  Underlying etiology felt to be thyroid  disorder, HFpEF, obesity, and OSA.  Continue treatment of sleep apnea with CPAP, weight loss, and volume management is recommended.  HTN: Blood pressure is well-controlled in the office today.  Continue pharmacotherapy as outlined above.  HLD: LDL 63 in 08/2023.  She remains on atorvastatin  80 mg and ezetimibe  10 mg.  Check lipid panel, direct LDL, and LFT.  Hyperthyroidism: TSH normal on most recent check.  Remains on methimazole .  Followed by endocrinology.  Obesity with OSA: Weight loss encouraged through healthy diet, GLP-1 therapy, and recommendation to continue with CPAP.   Disposition: F/u with Dr. Darron or an APP in 6 months.   Medication Adjustments/Labs and Tests Ordered: Current medicines are reviewed at length with the patient today.  Concerns regarding medicines are outlined above. Medication changes, Labs and Tests ordered today are summarized above and listed in the Patient Instructions accessible in Encounters.   Signed, Bernardino Bring, PA-C 08/23/2024 12:41 PM     [facility] [place] [address] [place], [place] [address] [phone]

## 2024-08-23 ENCOUNTER — Encounter: Payer: Self-pay | Admitting: Physician Assistant

## 2024-08-23 ENCOUNTER — Ambulatory Visit: Attending: Physician Assistant | Admitting: Physician Assistant

## 2024-08-23 VITALS — BP 136/90 | HR 71 | Ht 64.0 in | Wt 220.0 lb

## 2024-08-23 DIAGNOSIS — E0590 Thyrotoxicosis, unspecified without thyrotoxic crisis or storm: Secondary | ICD-10-CM

## 2024-08-23 DIAGNOSIS — I10 Essential (primary) hypertension: Secondary | ICD-10-CM

## 2024-08-23 DIAGNOSIS — I2510 Atherosclerotic heart disease of native coronary artery without angina pectoris: Secondary | ICD-10-CM | POA: Diagnosis not present

## 2024-08-23 DIAGNOSIS — G4733 Obstructive sleep apnea (adult) (pediatric): Secondary | ICD-10-CM

## 2024-08-23 DIAGNOSIS — Z6837 Body mass index (BMI) 37.0-37.9, adult: Secondary | ICD-10-CM

## 2024-08-23 DIAGNOSIS — Z79899 Other long term (current) drug therapy: Secondary | ICD-10-CM

## 2024-08-23 DIAGNOSIS — I5020 Unspecified systolic (congestive) heart failure: Secondary | ICD-10-CM

## 2024-08-23 DIAGNOSIS — I272 Pulmonary hypertension, unspecified: Secondary | ICD-10-CM | POA: Diagnosis not present

## 2024-08-23 DIAGNOSIS — E785 Hyperlipidemia, unspecified: Secondary | ICD-10-CM

## 2024-08-23 DIAGNOSIS — E66812 Obesity, class 2: Secondary | ICD-10-CM

## 2024-08-23 NOTE — Patient Instructions (Signed)
 Medication Instructions:   Your physician recommends that you continue on your current medications as directed. Please refer to the Current Medication list given to you today.    *If you need a refill on your cardiac medications before your next appointment, please call your pharmacy*  Lab Work:  Your provider would like for you to have following labs drawn today CMet, LDL Direct, Lipid Panel.    If you have labs (blood work) drawn today and your tests are completely normal, you will receive your results only by:  MyChart Message (if you have MyChart) OR  A paper copy in the mail If you have any lab test that is abnormal or we need to change your treatment, we will call you to review the results.  Testing/Procedures:  None ordered at this time   Referrals:  None ordered at this time   Follow-Up:  At [facility], you and your health needs are our priority.  As part of our continuing mission to provide you with exceptional heart care, our providers are all part of one team.  This team includes your primary Cardiologist (physician) and Advanced Practice Providers or APPs (Physician Assistants and Nurse Practitioners) who all work together to provide you with the care you need, when you need it.  Your next appointment:   5 - 6 month(s)  Provider:    You may see Deatrice Cage, MD or one of the following Advanced Practice Providers on your designated Care Team:   Lonni Meager, NP Lesley Maffucci, PA-C Bernardino Bring, PA-C Cadence Sheffield, PA-C Tylene Lunch, NP Barnie Hila, NP    We recommend signing up for the patient portal called MyChart.  Sign up information is provided on this After Visit Summary.  MyChart is used to connect with patients for Virtual Visits (Telemedicine).  Patients are able to view lab/test results, encounter notes, upcoming appointments, etc.  Non-urgent messages can be sent to your provider as well.   To learn more about what you can do with  MyChart, go to [email or web address].

## 2024-08-24 LAB — LIPID PANEL
Chol/HDL Ratio: 4.1 ratio (ref 0.0–4.4)
Cholesterol, Total: 208 mg/dL — ABNORMAL HIGH (ref 100–199)
HDL: 51 mg/dL (ref >39)
LDL Chol Calc (NIH): 134 mg/dL — ABNORMAL HIGH (ref 0–99)
Triglycerides: 129 mg/dL (ref 0–149)
VLDL Cholesterol Cal: 23 mg/dL (ref 5–40)

## 2024-08-24 LAB — COMPREHENSIVE METABOLIC PANEL WITH GFR
ALT: 11 IU/L (ref 0–32)
AST: 15 IU/L (ref 0–40)
Albumin: 4.6 g/dL (ref 3.8–4.9)
Alkaline Phosphatase: 72 IU/L (ref 49–135)
BUN/Creatinine Ratio: 16 (ref 9–23)
BUN: 13 mg/dL (ref 6–24)
Bilirubin Total: 0.4 mg/dL (ref 0.0–1.2)
CO2: 23 mmol/L (ref 20–29)
Calcium: 9.4 mg/dL (ref 8.7–10.2)
Chloride: 102 mmol/L (ref 96–106)
Creatinine, Ser: 0.81 mg/dL (ref 0.57–1.00)
Globulin, Total: 3 g/dL (ref 1.5–4.5)
Glucose: 94 mg/dL (ref 70–99)
Potassium: 4.2 mmol/L (ref 3.5–5.2)
Sodium: 143 mmol/L (ref 134–144)
Total Protein: 7.6 g/dL (ref 6.0–8.5)
eGFR: 86 mL/min/1.73 (ref >59)

## 2024-08-24 LAB — LDL CHOLESTEROL, DIRECT: LDL Direct: 135 mg/dL — ABNORMAL HIGH (ref 0–99)

## 2024-08-26 ENCOUNTER — Ambulatory Visit: Payer: Self-pay | Admitting: Physician Assistant

## 2024-09-05 ENCOUNTER — Other Ambulatory Visit: Payer: Self-pay | Admitting: Primary Care

## 2024-09-05 DIAGNOSIS — E1165 Type 2 diabetes mellitus with hyperglycemia: Secondary | ICD-10-CM

## 2024-09-06 ENCOUNTER — Encounter: Admitting: Primary Care

## 2024-09-06 NOTE — Telephone Encounter (Signed)
 Another attempt to ascertain pt's compliance status with cholesterol meds

## 2024-09-17 ENCOUNTER — Encounter: Admitting: Primary Care

## 2024-10-01 ENCOUNTER — Ambulatory Visit: Admitting: Primary Care

## 2024-10-01 ENCOUNTER — Encounter: Payer: Self-pay | Admitting: Primary Care

## 2024-10-01 VITALS — BP 118/84 | HR 77 | Temp 98.0°F | Ht 63.25 in | Wt 222.4 lb

## 2024-10-01 DIAGNOSIS — I2510 Atherosclerotic heart disease of native coronary artery without angina pectoris: Secondary | ICD-10-CM

## 2024-10-01 DIAGNOSIS — I10 Essential (primary) hypertension: Secondary | ICD-10-CM | POA: Diagnosis not present

## 2024-10-01 DIAGNOSIS — E0590 Thyrotoxicosis, unspecified without thyrotoxic crisis or storm: Secondary | ICD-10-CM

## 2024-10-01 DIAGNOSIS — I509 Heart failure, unspecified: Secondary | ICD-10-CM

## 2024-10-01 DIAGNOSIS — Z1231 Encounter for screening mammogram for malignant neoplasm of breast: Secondary | ICD-10-CM | POA: Diagnosis not present

## 2024-10-01 DIAGNOSIS — E785 Hyperlipidemia, unspecified: Secondary | ICD-10-CM | POA: Diagnosis not present

## 2024-10-01 DIAGNOSIS — Z0000 Encounter for general adult medical examination without abnormal findings: Secondary | ICD-10-CM

## 2024-10-01 DIAGNOSIS — E1165 Type 2 diabetes mellitus with hyperglycemia: Secondary | ICD-10-CM

## 2024-10-01 DIAGNOSIS — G4733 Obstructive sleep apnea (adult) (pediatric): Secondary | ICD-10-CM

## 2024-10-01 DIAGNOSIS — Z7985 Long-term (current) use of injectable non-insulin antidiabetic drugs: Secondary | ICD-10-CM | POA: Diagnosis not present

## 2024-10-01 LAB — POCT GLYCOSYLATED HEMOGLOBIN (HGB A1C): Hemoglobin A1C: 5.6 % (ref 4.0–5.6)

## 2024-10-01 LAB — MICROALBUMIN / CREATININE URINE RATIO
Creatinine,U: 78.5 mg/dL
Microalb Creat Ratio: 10.8 mg/g (ref 0.0–30.0)
Microalb, Ur: 0.8 mg/dL (ref 0.7–1.9)

## 2024-10-01 MED ORDER — ATORVASTATIN CALCIUM 80 MG PO TABS: 80.0000 mg | ORAL_TABLET | Freq: Every day | ORAL | 3 refills | Status: AC

## 2024-10-01 NOTE — Assessment & Plan Note (Signed)
 Controlled.  Continue metoprolol  succinate 25 mg daily, valsartan  160 mg daily. CMP reviewed from November 2025

## 2024-10-01 NOTE — Progress Notes (Signed)
 Subjective:    Patient ID: Amber [name], female    DOB: 05-08-1971, 54 y.o.   MRN: [id number]  Amber [name] is a very pleasant 53 y.o. female who presents today for complete physical and follow up of chronic conditions.  Immunizations: -Tetanus: Completed in 2016 -Influenza: completed this season   -Shingles: Completed Shingrix  series -Pneumonia: Completed in 2020   Diet: Fair diet.  Exercise: No regular exercise.  Eye exam: Completes annually  Dental exam: Completed > 1 year ago  Pap Smear: Completed in November 2023 Mammogram: Completed in January 2024  Colonoscopy: Completed in 2022, due 2032  BP Readings from Last 3 Encounters:  10/01/24 118/84  08/23/24 (!) 136/90  03/20/24 130/82    Wt Readings from Last 3 Encounters:  10/01/24 222 lb 6 oz (100.9 kg)  08/23/24 220 lb (99.8 kg)  03/20/24 214 lb (97.1 kg)      Review of Systems  Constitutional:  Negative for unexpected weight change.  HENT:  Negative for rhinorrhea.   Respiratory:  Negative for cough and shortness of breath.   Cardiovascular:  Negative for chest pain.  Gastrointestinal:  Negative for constipation and diarrhea.  Genitourinary:  Negative for difficulty urinating.  Musculoskeletal:  Negative for arthralgias and myalgias.  Skin:  Negative for rash.  Allergic/Immunologic: Negative for environmental allergies.  Neurological:  Negative for dizziness and headaches.  Psychiatric/Behavioral:  The patient is not nervous/anxious.          Past Medical History:  Diagnosis Date   Acute encephalopathy    Acute pulmonary edema (HCC)    Acute respiratory failure with hypoxia (HCC) 05/20/2019   Anemia    Aortic dilatation    37 mm (ascending aorta) 07/2022   CAD (coronary artery disease)    s/p DES to mLAD in 2020, NSTEMI, HFrEF with improvement in EF.   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   Thyroid  storm    Unexplained weight loss 03/27/2019   Valvular insufficiency    Mild MR mild TR noted on echocardiogram in October 2023.    Social History   Socioeconomic History   Marital status: Married    Spouse name: Not on file   Number of children: 2   Years of education: Not on file   Highest education level: Not on file  Occupational History    Comment: front desk   Tobacco Use   Smoking status: Never   Smokeless tobacco: Never  Vaping Use   Vaping status: Never Used  Substance and Sexual Activity   Alcohol use: Yes    Alcohol/week: 0.0 standard drinks of alcohol    Comment: rare   Drug use: No   Sexual activity: Not Currently  Other Topics Concern  Not on file  Social History Narrative   From [place].   Married.   2 children.   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   Social Drivers of Health   Tobacco Use: Low Risk (10/01/2024)   Patient History    Smoking Tobacco Use: Never    Smokeless Tobacco Use: Never    Passive Exposure: Not on file  Financial Resource Strain: Not on file  Food Insecurity: Not on file  Transportation Needs: Not on file  Physical Activity: Not on file  Stress: Not on file  Social Connections: Not on file  Intimate Partner Violence: Not on file  Depression (PHQ2-9): Low Risk  (03/06/2024)   Depression (PHQ2-9)    PHQ-2 Score: 0  Alcohol Screen: Not on file  Housing: Not on file  Utilities: Not on file  Health Literacy: Not on file    Past Surgical History:  Procedure Laterality Date   CARDIAC CATHETERIZATION     CESAREAN SECTION     x2   COLONOSCOPY N/A 06/24/2021   Procedure: COLONOSCOPY;  Surgeon: Therisa Bi, MD;  Location: [facility] ENDOSCOPY;  Service: Gastroenterology;  Laterality: N/A;   CORONARY STENT INTERVENTION N/A 05/22/2019   Procedure: CORONARY STENT INTERVENTION;  Surgeon: Darron Deatrice LABOR, MD;  Location: [facility] INVASIVE CV LAB;  Service: Cardiovascular;  Laterality: N/A;   MASS EXCISION Right 01/18/2013   Procedure: EXCISION right scapular cyst;  Surgeon: Lynda Leos, MD;  Location: [facility] ORS;  Service: General;  Laterality: Right;   RIGHT HEART CATH Right 10/19/2022   Procedure: RIGHT HEART CATH;  Surgeon: Mady Bruckner, MD;  Location: [facility] INVASIVE CV LAB;  Service: Cardiovascular;  Laterality: Right;   RIGHT/LEFT HEART CATH AND CORONARY ANGIOGRAPHY N/A 05/22/2019   Procedure: RIGHT/LEFT HEART CATH AND CORONARY ANGIOGRAPHY;  Surgeon: Darron Deatrice LABOR, MD;  Location: [facility] INVASIVE CV LAB;  Service: Cardiovascular;  Laterality: N/A;    Family History  Problem Relation Age of Onset   Diabetes Father    Diabetes Mother    Heart disease Mother    Diabetes Sister    Hypertension Sister     Allergies[1]  Medications Ordered Prior to Encounter[2]  BP 118/84   Pulse 77   Temp 98 F (36.7 C) (Oral)   Ht 5' 3.25 (1.607 m)   Wt 222 lb 6 oz (100.9 kg)   LMP 05/19/2021   SpO2 97%   BMI 39.08 kg/m  Objective:   Physical Exam HENT:     Right Ear: Tympanic membrane and ear canal normal.     Left Ear: Tympanic membrane and ear canal normal.  Eyes:     Pupils: Pupils are equal, round, and reactive to light.  Cardiovascular:     Rate and Rhythm: Normal rate and regular rhythm.  Pulmonary:     Effort: Pulmonary effort is normal.     Breath  sounds: Normal breath sounds.  Abdominal:     General: Bowel sounds are normal.     Palpations: Abdomen is soft.     Tenderness: There is no abdominal tenderness.  Musculoskeletal:        General: Normal range of motion.     Cervical back: Neck supple.  Skin:    General: Skin is warm and dry.  Neurological:     Mental Status: She is alert and oriented to person, place, and time.     Cranial Nerves: No cranial nerve deficit.     Deep  Tendon Reflexes:     Reflex Scores:      Patellar reflexes are 2+ on the right side and 2+ on the left side. Psychiatric:        Mood and Affect: Mood normal.     Physical Exam        Assessment & Plan:  Preventative health care Assessment & Plan: Immunizations UTD. Pap smear UTD. Mammogram due, orders placed. Colonoscopy UTD, due 2032  Discussed the importance of a healthy diet and regular exercise in order for weight loss, and to reduce the risk of further co-morbidity.  Exam stable. Labs pending.  Follow up in 1 year for repeat physical.    Screening mammogram for breast cancer -     3D Screening Mammogram, Left and Right; Future  Essential hypertension Assessment & Plan: Controlled.  Continue metoprolol  succinate 25 mg daily, valsartan  160 mg daily. CMP reviewed from November 2025   Type 2 diabetes mellitus with hyperglycemia, without long-term current use of insulin (HCC) Assessment & Plan:   Continue Ozempic  2 mg weekly. Offered to switch to Mounjaro for better weight loss, she kindly declines.  Urine microalbumin pending  Orders: -     POCT glycosylated hemoglobin (Hb A1C) -     Microalbumin / creatinine urine ratio  Hyperlipidemia, unspecified hyperlipidemia type Assessment & Plan:  Out of atorvastatin  for at least 1 year per patient Resume atorvastatin  80 mg daily, refills provided Continue Zetia  10 mg daily    Orders: -     Atorvastatin  Calcium ; Take 1 tablet (80 mg total) by mouth daily. for  cholesterol.  Dispense: 90 tablet; Refill: 3  Congestive heart failure, unspecified HF chronicity, unspecified heart failure type [facility]) Assessment & Plan: Appears euvolemic today/  Following with cardiology, office notes and labs reviewed from November 2025. Continue furosemide  40 mg daily, spironolactone  25 mg daily.   Coronary artery disease involving native coronary artery of native heart without angina pectoris Assessment & Plan: Asymptomatic. Following with cardiology, office notes and labs reviewed from November 2025.  Will resume atorvastatin  80 mg daily as she has been without for quite some time. Continue Zetia  10 mg daily  Continue diabetes, lipid, and diabetes control.   OSA (obstructive sleep apnea) Assessment & Plan: Continue CPAP nightly   Hyperthyroidism Assessment & Plan: Following with endocrinology, office notes reviewed from June 2025. Continue methimazole  5 mg twice daily     Assessment and Plan Assessment & Plan         Comer MARLA Gaskins, NP        [1] No Known Allergies [2]  Current Outpatient Medications on File Prior to Visit  Medication Sig Dispense Refill   aspirin  81 MG chewable tablet Chew 1 tablet (81 mg total) by mouth daily. 30 tablet 11   calcium -vitamin D  250-100 MG-UNIT tablet Take 1 tablet by mouth daily.     ezetimibe  (ZETIA ) 10 MG tablet TAKE 1 TABLET BY MOUTH EVERY DAY 90 tablet 2   ferrous sulfate  325 (65 FE) MG tablet Take 325 mg by mouth 3 (three) times a week. No set days     furosemide  (LASIX ) 40 MG tablet TAKE 1 TABLET BY MOUTH EVERY DAY 90 tablet 0   metFORMIN  (GLUCOPHAGE -XR) 500 MG 24 hr tablet TAKE 1 TABLET (500 MG TOTAL) BY MOUTH DAILY WITH BREAKFAST. FOR DIABETES. 90 tablet 0   methimazole  (TAPAZOLE ) 5 MG tablet Take 2 tablets (10 mg total) by mouth 2 (two) times daily. 360 tablet 2   metoprolol   succinate (TOPROL -XL) 25 MG 24 hr tablet TAKE 1 TABLET BY MOUTH EVERY DAY 30 tablet 2   nitroGLYCERIN   (NITROSTAT ) 0.4 MG SL tablet Place 1 tablet (0.4 mg total) under the tongue every 5 (five) minutes as needed for chest pain. 25 tablet 1   Semaglutide , 2 MG/DOSE, (OZEMPIC , 2 MG/DOSE,) 8 MG/3ML SOPN INJECT 2 MG INTO THE SKIN ONCE A WEEK. FOR DIABETES. 9 mL 1   spironolactone  (ALDACTONE ) 25 MG tablet TAKE 1/2 TABLET BY MOUTH DAILY 45 tablet 3   valsartan  (DIOVAN ) 160 MG tablet TAKE 1 TABLET BY MOUTH EVERY DAY 90 tablet 3   Current Facility-Administered Medications on File Prior to Visit  Medication Dose Route Frequency Provider Last Rate Last Admin   sodium chloride  flush (NS) 0.9 % injection 3 mL  3 mL Intravenous Q12H Gerard Frederick, NP

## 2024-10-01 NOTE — Assessment & Plan Note (Signed)
 Immunizations UTD. Pap smear UTD. Mammogram due, orders placed. Colonoscopy UTD, due 2032  Discussed the importance of a healthy diet and regular exercise in order for weight loss, and to reduce the risk of further co-morbidity.  Exam stable. Labs pending.  Follow up in 1 year for repeat physical.

## 2024-10-01 NOTE — Assessment & Plan Note (Signed)
° °  Continue Ozempic  2 mg weekly. Offered to switch to Mounjaro for better weight loss, she kindly declines.  Urine microalbumin pending

## 2024-10-01 NOTE — Assessment & Plan Note (Signed)
 Appears euvolemic today/  Following with cardiology, office notes and labs reviewed from November 2025. Continue furosemide  40 mg daily, spironolactone  25 mg daily.

## 2024-10-01 NOTE — Assessment & Plan Note (Signed)
Continue CPAP nightly. °

## 2024-10-01 NOTE — Assessment & Plan Note (Signed)
 Asymptomatic. Following with cardiology, office notes and labs reviewed from November 2025.  Will resume atorvastatin  80 mg daily as she has been without for quite some time. Continue Zetia  10 mg daily  Continue diabetes, lipid, and diabetes control.

## 2024-10-01 NOTE — Assessment & Plan Note (Signed)
 Following with endocrinology, office notes reviewed from June 2025. Continue methimazole  5 mg twice daily

## 2024-10-01 NOTE — Assessment & Plan Note (Addendum)
°  Out of atorvastatin  for at least 1 year per patient Resume atorvastatin  80 mg daily, refills provided Continue Zetia  10 mg daily

## 2024-10-02 ENCOUNTER — Ambulatory Visit: Payer: Self-pay | Admitting: Primary Care

## 2024-10-21 ENCOUNTER — Ambulatory Visit: Admitting: Internal Medicine

## 2024-11-05 ENCOUNTER — Ambulatory Visit: Admitting: Internal Medicine

## 2024-11-05 ENCOUNTER — Other Ambulatory Visit

## 2024-11-05 ENCOUNTER — Encounter: Payer: Self-pay | Admitting: Internal Medicine

## 2024-11-05 ENCOUNTER — Ambulatory Visit
Admission: RE | Admit: 2024-11-05 | Discharge: 2024-11-05 | Disposition: A | Source: Ambulatory Visit | Attending: Primary Care

## 2024-11-05 VITALS — BP 138/90 | Ht 63.25 in | Wt 224.0 lb

## 2024-11-05 DIAGNOSIS — Z1231 Encounter for screening mammogram for malignant neoplasm of breast: Secondary | ICD-10-CM

## 2024-11-05 DIAGNOSIS — E0590 Thyrotoxicosis, unspecified without thyrotoxic crisis or storm: Secondary | ICD-10-CM

## 2024-11-05 DIAGNOSIS — E0500 Thyrotoxicosis with diffuse goiter without thyrotoxic crisis or storm: Secondary | ICD-10-CM

## 2024-11-05 NOTE — Progress Notes (Unsigned)
 "  Name: Amber [name]  MRN/ DOB: [id number], 1970-12-08    Age/ Sex: 54 y.o., female     PCP: Gretta Comer POUR, NP   Reason for Endocrinology Evaluation: Hyperthytroidism     Initial Endocrinology Clinic Visit: 04/03/2019    PATIENT IDENTIFIER: [name] is a 54 y.o., female with a past medical history of HTN and CAD (S/P PCI 05/2019).She has followed with [facility] since 04/03/2019 for consultative assistance with management of her Hyperthyrodism     HISTORICAL SUMMARY: The patient was first diagnosed with hyperthyroidism in 03/2019, with a suppressed TSH at < 0.01 uIU/mL with elevated FT4 5.96 ng/dL. She did have weight loss  For ~ 4 months prior to her presentation.  Methimazole  started in 03/2019    SUBJECTIVE:     Today (11/05/2024):  [name] is here for a follow up on hyperthyroidism and Graves' disease.  She continues to follow-up with cardiology for CAD She is recovering from URI  No loca neck swelling  No palpitations  No constipation  No eye symptoms Methimazole  5 mg, 2 tabs  BID    HISTORY:  Past Medical History:  Past Medical History:  Diagnosis Date   Acute encephalopathy    Acute pulmonary edema (HCC)    Acute respiratory failure with hypoxia (HCC) 05/20/2019   Anemia    Aortic dilatation    37 mm (ascending aorta) 07/2022   CAD (coronary artery disease)    s/p DES to mLAD in 2020, NSTEMI, HFrEF with improvement in EF.   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   Thyroid  storm    Unexplained weight loss 03/27/2019   Valvular insufficiency    Mild MR mild TR noted on echocardiogram in October 2023.   Past Surgical History:  Past Surgical History:  Procedure Laterality Date   CARDIAC CATHETERIZATION     CESAREAN SECTION     x2   COLONOSCOPY N/A 06/24/2021   Procedure: COLONOSCOPY;  Surgeon: Therisa Bi, MD;  Location[facility] ENDOSCOPY;  Service: Gastroenterology;  Laterality: N/A;   CORONARY STENT INTERVENTION N/A 05/22/2019   Procedure: CORONARY STENT INTERVENTION;  Surgeon: Darron Deatrice LABOR, MD;  Location: [facility] INVASIVE CV LAB;  Service: Cardiovascular;  Laterality: N/A;   MASS EXCISION Right 01/18/2013   Procedure: EXCISION right scapular cyst;  Surgeon: Lynda Leos, MD;  Location: [facility] ORS;  Service: General;  Laterality: Right;   RIGHT HEART CATH Right 10/19/2022   Procedure: RIGHT HEART CATH;  Surgeon: Mady Bruckner, MD;  Location: [facility] INVASIVE CV LAB;  Service: Cardiovascular;  Laterality: Right;   RIGHT/LEFT HEART CATH AND CORONARY ANGIOGRAPHY N/A 05/22/2019   Procedure: RIGHT/LEFT HEART CATH AND CORONARY ANGIOGRAPHY;  Surgeon: Darron Deatrice LABOR, MD;  Location: [facility]  INVASIVE CV LAB;  Service: Cardiovascular;  Laterality: N/A;   Social History:  reports that she has never smoked. She has never used smokeless tobacco. She reports current alcohol use. She reports that she does not use drugs. Family History:  Family History  Problem Relation Age of Onset   Diabetes Mother    Heart disease Mother    Diabetes Father    Diabetes Sister    Hypertension Sister    Breast cancer Neg Hx      HOME MEDICATIONS: Allergies as of 11/05/2024   No Known Allergies      Medication List        Accurate as of November 05, 2024  1:24 PM. If you have any questions, ask your nurse or doctor.          aspirin  81 MG chewable tablet Chew 1 tablet (81 mg total) by mouth daily.   atorvastatin  80 MG tablet Commonly known as: LIPITOR  Take 1 tablet (80 mg total) by mouth daily. for cholesterol.   calcium -vitamin D  250-100 MG-UNIT tablet Take 1 tablet by mouth daily.   ezetimibe  10 MG tablet Commonly known as: ZETIA  TAKE 1 TABLET BY MOUTH EVERY DAY   ferrous sulfate  325 (65 FE) MG tablet Take 325 mg by mouth 3 (three) times a week. No set days   furosemide  40 MG tablet Commonly known as: LASIX  TAKE 1 TABLET BY MOUTH EVERY DAY   metFORMIN  500 MG 24 hr tablet Commonly known as: GLUCOPHAGE -XR TAKE 1 TABLET (500 MG TOTAL) BY MOUTH DAILY WITH BREAKFAST. FOR DIABETES.   methimazole  5 MG tablet Commonly known as: TAPAZOLE  Take 2 tablets (10 mg total) by mouth 2 (two) times daily.   metoprolol  succinate 25 MG 24 hr tablet Commonly known as: TOPROL -XL TAKE 1 TABLET BY MOUTH EVERY DAY   nitroGLYCERIN  0.4 MG SL tablet Commonly known as: Nitrostat  Place 1 tablet (0.4 mg total) under the tongue every 5 (five) minutes as needed for chest pain.   Ozempic  (2 MG/DOSE) 8 MG/3ML Sopn Generic drug: Semaglutide  (2 MG/DOSE) INJECT 2 MG INTO THE SKIN ONCE A WEEK. FOR DIABETES.   spironolactone  25 MG tablet Commonly known as: ALDACTONE  TAKE 1/2 TABLET BY MOUTH DAILY   valsartan  160 MG tablet Commonly known as: DIOVAN  TAKE 1 TABLET BY MOUTH EVERY DAY          OBJECTIVE:   PHYSICAL EXAM: VS: BP (!) 138/90   Ht 5' 3.25 (1.607 m)   Wt 224 lb (101.6 kg)   LMP 05/19/2021   BMI 39.37 kg/m    EXAM: General: Pt appears well and is in NAD  Neck: General: Supple without adenopathy. Thyroid : Thyroid  size is prominent.  Lungs: Clear with good BS bilat   Heart: Auscultation: RRR.  Extremities:  BL LE: No pretibial edema  Mental Status: Mood and affect: No depression, anxiety, or agitation     DATA  REVIEWED:    Latest Reference Range & Units 11/05/24 13:46  TSH mIU/L 3.42  Triiodothyronine,Free,Serum 2.3 - 4.2 pg/mL 3.0  T4,Free(Direct) 0.8 - 1.8 ng/dL 1.0    Thyroid  ultrasound 01/06/2022  Estimated total number of nodules >/= 1 cm: 0   Number of spongiform nodules >/=  2 cm not described below (TR1): 0   Number of mixed cystic and solid nodules >/= 1.5 cm not described below (TR2): 0   _________________________________________________________   Marked thyroid  heterogeneity and mild diffuse enlargement. Background pseudo nodularity noted. No hypervascularity. No discrete nodule or focal abnormality.  No regional adenopathy.   IMPRESSION: Heterogeneous mildly enlarged thyroid  compatible with chronic medical thyroid  disease.   Negative for nodule.    ASSESSMENT / PLAN / RECOMMENDATIONS:   Hyperthyroidism Secondary to Graves' Disease:  -Patient is clinically euthyroid -No local neck symptoms -Historically she has been sensitive to methimazole  dose reduction with uncontrolled hyperthyroid  - Today we discussed alternative definitive therapy, I would not recommend RAi due to risk of Graves' orbitopathy in the setting of Graves' disease, her only 2 options is either continuing methimazole  versus total thyroidectomy.  I did explain to the patient the need for lifelong LT-4 replacement therapy following total thyroidectomy - Patient is not opposed to total thyroidectomy if necessary - TFTs remain within normal range, we do have room to decrease methimazole  as below  Medications  Decrease methimazole  5 mg,3 tabs daily   2. Graves' Disease:   -Patient with minimal right eye proptosis - She does follow-up with ophthalmology yearly    Follow-up in 6 months   Signed electronically by: Stefano Redgie Butts, MD  [facility] Endocrinology  [facility] [address] [place] [place], [place] [address] Phone: [phone] FAX: [phone]       CC: Gretta Comer POUR, NP [address] [place] [address] Phone: [phone]  Fax: [phone]   Return to Endocrinology clinic as below: Future Appointments  Date Time Provider Department Center  11/05/2024  1:40 PM Hrithik Boschee, Donell Redgie, MD [facility] None  01/21/2025 10:05 AM Abigail Bernardino HERO, PA-C [facility] None     "

## 2024-11-06 ENCOUNTER — Ambulatory Visit: Payer: Self-pay | Admitting: Internal Medicine

## 2024-11-06 LAB — T3, FREE: T3, Free: 3 pg/mL (ref 2.3–4.2)

## 2024-11-06 LAB — T4, FREE: Free T4: 1 ng/dL (ref 0.8–1.8)

## 2024-11-06 LAB — TSH: TSH: 3.42 m[IU]/L

## 2024-11-06 MED ORDER — METHIMAZOLE 5 MG PO TABS: 15.0000 mg | ORAL_TABLET | Freq: Every day | ORAL | 2 refills | Status: AC

## 2024-11-06 NOTE — Telephone Encounter (Signed)
 Patient aware of results and recommendations.

## 2024-11-06 NOTE — Telephone Encounter (Signed)
-----   Message from Donell Redgie Butts, MD sent at 11/06/2024 11:46 AM EST -----

## 2024-11-06 NOTE — Telephone Encounter (Signed)
 Please let the patient know that her thyroid  test results are within normal range, but we do have room to decrease her methimazole .   She is currently on 4 tablets a day, please ask her to decrease it to just THREE tablets a day.   She may take 2 in the morning and 1 in the evening    Thank you

## 2024-11-07 ENCOUNTER — Other Ambulatory Visit: Payer: Self-pay | Admitting: Primary Care

## 2024-11-07 DIAGNOSIS — E1165 Type 2 diabetes mellitus with hyperglycemia: Secondary | ICD-10-CM

## 2024-11-09 ENCOUNTER — Other Ambulatory Visit: Payer: Self-pay | Admitting: Internal Medicine

## 2025-01-21 ENCOUNTER — Ambulatory Visit: Admitting: Physician Assistant

## 2025-05-05 ENCOUNTER — Ambulatory Visit: Admitting: Internal Medicine
